# Patient Record
Sex: Female | Born: 1945
Health system: Southern US, Community
[De-identification: ages and names within clinical notes are randomized; demographics above are authoritative.]

## PROBLEM LIST (undated history)

## (undated) DIAGNOSIS — I509 Heart failure, unspecified: Secondary | ICD-10-CM

## (undated) DIAGNOSIS — E785 Hyperlipidemia, unspecified: Secondary | ICD-10-CM

## (undated) DIAGNOSIS — M5136 Other intervertebral disc degeneration, lumbar region: Secondary | ICD-10-CM

## (undated) DIAGNOSIS — R7989 Other specified abnormal findings of blood chemistry: Secondary | ICD-10-CM

## (undated) DIAGNOSIS — I10 Essential (primary) hypertension: Secondary | ICD-10-CM

## (undated) DIAGNOSIS — I429 Cardiomyopathy, unspecified: Secondary | ICD-10-CM

## (undated) DIAGNOSIS — R778 Other specified abnormalities of plasma proteins: Secondary | ICD-10-CM

## (undated) DIAGNOSIS — M51369 Other intervertebral disc degeneration, lumbar region without mention of lumbar back pain or lower extremity pain: Secondary | ICD-10-CM

## (undated) DIAGNOSIS — I480 Paroxysmal atrial fibrillation: Secondary | ICD-10-CM

## (undated) DIAGNOSIS — N201 Calculus of ureter: Secondary | ICD-10-CM

## (undated) DIAGNOSIS — J189 Pneumonia, unspecified organism: Secondary | ICD-10-CM

## (undated) DIAGNOSIS — K5790 Diverticulosis of intestine, part unspecified, without perforation or abscess without bleeding: Secondary | ICD-10-CM

## (undated) DIAGNOSIS — K219 Gastro-esophageal reflux disease without esophagitis: Secondary | ICD-10-CM

## (undated) DIAGNOSIS — E119 Type 2 diabetes mellitus without complications: Secondary | ICD-10-CM

## (undated) DIAGNOSIS — M5430 Sciatica, unspecified side: Secondary | ICD-10-CM

## (undated) DIAGNOSIS — N289 Disorder of kidney and ureter, unspecified: Secondary | ICD-10-CM

## (undated) DIAGNOSIS — I447 Left bundle-branch block, unspecified: Secondary | ICD-10-CM

## (undated) DIAGNOSIS — Z87442 Personal history of urinary calculi: Secondary | ICD-10-CM

## (undated) DIAGNOSIS — F419 Anxiety disorder, unspecified: Secondary | ICD-10-CM

## (undated) HISTORY — DX: Paroxysmal atrial fibrillation: I48.0

## (undated) HISTORY — PX: ABDOMINAL HYSTERECTOMY: SHX81

## (undated) HISTORY — DX: Hyperlipidemia, unspecified: E78.5

## (undated) HISTORY — DX: Other specified abnormal findings of blood chemistry: R79.89

## (undated) HISTORY — DX: Gastro-esophageal reflux disease without esophagitis: K21.9

## (undated) HISTORY — DX: Anxiety disorder, unspecified: F41.9

## (undated) HISTORY — PX: FRACTURE SURGERY: SHX138

## (undated) HISTORY — DX: Other specified abnormalities of plasma proteins: R77.8

## (undated) HISTORY — PX: OTHER SURGICAL HISTORY: SHX169

## (undated) HISTORY — PX: CHOLECYSTECTOMY: SHX55

## (undated) HISTORY — DX: Essential (primary) hypertension: I10

---

## 2011-07-05 ENCOUNTER — Emergency Department: Payer: Self-pay | Admitting: Emergency Medicine

## 2011-07-06 ENCOUNTER — Emergency Department: Payer: Self-pay | Admitting: *Deleted

## 2011-10-11 ENCOUNTER — Emergency Department: Payer: Self-pay | Admitting: Emergency Medicine

## 2011-10-11 LAB — CBC
HCT: 41.4 % (ref 35.0–47.0)
MCH: 30.2 pg (ref 26.0–34.0)
MCV: 85 fL (ref 80–100)
Platelet: 166 10*3/uL (ref 150–440)
RBC: 4.86 10*6/uL (ref 3.80–5.20)
WBC: 6.2 10*3/uL (ref 3.6–11.0)

## 2011-10-11 LAB — COMPREHENSIVE METABOLIC PANEL
Albumin: 4.3 g/dL (ref 3.4–5.0)
Alkaline Phosphatase: 161 U/L — ABNORMAL HIGH (ref 50–136)
Anion Gap: 9 (ref 7–16)
BUN: 9 mg/dL (ref 7–18)
Bilirubin,Total: 0.8 mg/dL (ref 0.2–1.0)
Calcium, Total: 10 mg/dL (ref 8.5–10.1)
Chloride: 106 mmol/L (ref 98–107)
Co2: 28 mmol/L (ref 21–32)
Creatinine: 1.04 mg/dL (ref 0.60–1.30)
EGFR (African American): >60
Glucose: 209 mg/dL — ABNORMAL HIGH (ref 65–99)
Osmolality: 290 (ref 275–301)
Potassium: 3.4 mmol/L — ABNORMAL LOW (ref 3.5–5.1)
SGOT(AST): 34 U/L (ref 15–37)
Sodium: 143 mmol/L (ref 136–145)
Total Protein: 8.6 g/dL — ABNORMAL HIGH (ref 6.4–8.2)

## 2011-10-11 LAB — URINALYSIS, COMPLETE
Bilirubin,UR: NEGATIVE
Glucose,UR: 50 mg/dL (ref 0–75)
Ketone: NEGATIVE
Leukocyte Esterase: NEGATIVE
Ph: 9 (ref 4.5–8.0)
Protein: NEGATIVE
RBC,UR: 1 /HPF (ref 0–5)
Squamous Epithelial: <1
WBC UR: 1 /HPF (ref 0–5)

## 2012-10-10 ENCOUNTER — Ambulatory Visit: Payer: Self-pay

## 2014-05-14 DIAGNOSIS — S39012A Strain of muscle, fascia and tendon of lower back, initial encounter: Secondary | ICD-10-CM | POA: Diagnosis not present

## 2014-05-14 DIAGNOSIS — E119 Type 2 diabetes mellitus without complications: Secondary | ICD-10-CM | POA: Diagnosis not present

## 2014-05-14 DIAGNOSIS — K219 Gastro-esophageal reflux disease without esophagitis: Secondary | ICD-10-CM | POA: Diagnosis not present

## 2014-05-14 DIAGNOSIS — E782 Mixed hyperlipidemia: Secondary | ICD-10-CM | POA: Diagnosis not present

## 2014-06-16 DIAGNOSIS — R300 Dysuria: Secondary | ICD-10-CM | POA: Diagnosis not present

## 2014-06-16 DIAGNOSIS — J209 Acute bronchitis, unspecified: Secondary | ICD-10-CM | POA: Diagnosis not present

## 2014-07-10 DIAGNOSIS — N760 Acute vaginitis: Secondary | ICD-10-CM | POA: Diagnosis not present

## 2014-07-10 DIAGNOSIS — N898 Other specified noninflammatory disorders of vagina: Secondary | ICD-10-CM | POA: Diagnosis not present

## 2014-07-28 DIAGNOSIS — B373 Candidiasis of vulva and vagina: Secondary | ICD-10-CM | POA: Diagnosis not present

## 2014-07-28 DIAGNOSIS — I10 Essential (primary) hypertension: Secondary | ICD-10-CM | POA: Diagnosis not present

## 2014-07-29 DIAGNOSIS — B373 Candidiasis of vulva and vagina: Secondary | ICD-10-CM | POA: Diagnosis not present

## 2014-08-08 DIAGNOSIS — E784 Other hyperlipidemia: Secondary | ICD-10-CM | POA: Diagnosis not present

## 2014-08-08 DIAGNOSIS — I10 Essential (primary) hypertension: Secondary | ICD-10-CM | POA: Diagnosis not present

## 2014-08-08 DIAGNOSIS — E119 Type 2 diabetes mellitus without complications: Secondary | ICD-10-CM | POA: Diagnosis not present

## 2014-08-08 DIAGNOSIS — R5381 Other malaise: Secondary | ICD-10-CM | POA: Diagnosis not present

## 2014-08-11 DIAGNOSIS — I10 Essential (primary) hypertension: Secondary | ICD-10-CM | POA: Diagnosis not present

## 2014-08-11 DIAGNOSIS — B373 Candidiasis of vulva and vagina: Secondary | ICD-10-CM | POA: Diagnosis not present

## 2014-11-11 DIAGNOSIS — Z23 Encounter for immunization: Secondary | ICD-10-CM | POA: Diagnosis not present

## 2014-11-11 DIAGNOSIS — M545 Low back pain: Secondary | ICD-10-CM | POA: Diagnosis not present

## 2014-11-11 DIAGNOSIS — E119 Type 2 diabetes mellitus without complications: Secondary | ICD-10-CM | POA: Diagnosis not present

## 2015-03-31 DIAGNOSIS — I10 Essential (primary) hypertension: Secondary | ICD-10-CM | POA: Diagnosis not present

## 2015-03-31 DIAGNOSIS — E119 Type 2 diabetes mellitus without complications: Secondary | ICD-10-CM | POA: Diagnosis not present

## 2015-06-01 DIAGNOSIS — J209 Acute bronchitis, unspecified: Secondary | ICD-10-CM | POA: Diagnosis not present

## 2015-06-01 DIAGNOSIS — I10 Essential (primary) hypertension: Secondary | ICD-10-CM | POA: Diagnosis not present

## 2015-06-01 DIAGNOSIS — E119 Type 2 diabetes mellitus without complications: Secondary | ICD-10-CM | POA: Diagnosis not present

## 2015-06-11 DIAGNOSIS — I10 Essential (primary) hypertension: Secondary | ICD-10-CM | POA: Diagnosis not present

## 2015-06-11 DIAGNOSIS — J209 Acute bronchitis, unspecified: Secondary | ICD-10-CM | POA: Diagnosis not present

## 2015-06-11 DIAGNOSIS — E119 Type 2 diabetes mellitus without complications: Secondary | ICD-10-CM | POA: Diagnosis not present

## 2015-06-12 DIAGNOSIS — E784 Other hyperlipidemia: Secondary | ICD-10-CM | POA: Diagnosis not present

## 2015-06-12 DIAGNOSIS — E119 Type 2 diabetes mellitus without complications: Secondary | ICD-10-CM | POA: Diagnosis not present

## 2015-06-12 DIAGNOSIS — R5381 Other malaise: Secondary | ICD-10-CM | POA: Diagnosis not present

## 2015-06-12 DIAGNOSIS — I10 Essential (primary) hypertension: Secondary | ICD-10-CM | POA: Diagnosis not present

## 2015-06-30 DIAGNOSIS — I10 Essential (primary) hypertension: Secondary | ICD-10-CM | POA: Diagnosis not present

## 2015-06-30 DIAGNOSIS — E119 Type 2 diabetes mellitus without complications: Secondary | ICD-10-CM | POA: Diagnosis not present

## 2015-07-30 DIAGNOSIS — I10 Essential (primary) hypertension: Secondary | ICD-10-CM | POA: Diagnosis not present

## 2015-07-30 DIAGNOSIS — E119 Type 2 diabetes mellitus without complications: Secondary | ICD-10-CM | POA: Diagnosis not present

## 2015-10-30 DIAGNOSIS — I10 Essential (primary) hypertension: Secondary | ICD-10-CM | POA: Diagnosis not present

## 2015-10-30 DIAGNOSIS — E119 Type 2 diabetes mellitus without complications: Secondary | ICD-10-CM | POA: Diagnosis not present

## 2015-12-17 DIAGNOSIS — Z23 Encounter for immunization: Secondary | ICD-10-CM | POA: Diagnosis not present

## 2016-04-05 DIAGNOSIS — E119 Type 2 diabetes mellitus without complications: Secondary | ICD-10-CM | POA: Diagnosis not present

## 2016-04-05 DIAGNOSIS — I10 Essential (primary) hypertension: Secondary | ICD-10-CM | POA: Diagnosis not present

## 2016-04-05 DIAGNOSIS — M545 Low back pain: Secondary | ICD-10-CM | POA: Diagnosis not present

## 2016-05-04 DIAGNOSIS — I10 Essential (primary) hypertension: Secondary | ICD-10-CM | POA: Diagnosis not present

## 2016-05-04 DIAGNOSIS — E119 Type 2 diabetes mellitus without complications: Secondary | ICD-10-CM | POA: Diagnosis not present

## 2016-05-04 DIAGNOSIS — R5381 Other malaise: Secondary | ICD-10-CM | POA: Diagnosis not present

## 2016-05-04 DIAGNOSIS — E784 Other hyperlipidemia: Secondary | ICD-10-CM | POA: Diagnosis not present

## 2016-05-19 DIAGNOSIS — F419 Anxiety disorder, unspecified: Secondary | ICD-10-CM | POA: Diagnosis not present

## 2016-05-19 DIAGNOSIS — F329 Major depressive disorder, single episode, unspecified: Secondary | ICD-10-CM | POA: Diagnosis not present

## 2016-05-19 DIAGNOSIS — E119 Type 2 diabetes mellitus without complications: Secondary | ICD-10-CM | POA: Diagnosis not present

## 2016-05-19 DIAGNOSIS — M549 Dorsalgia, unspecified: Secondary | ICD-10-CM | POA: Diagnosis not present

## 2016-09-30 DIAGNOSIS — E119 Type 2 diabetes mellitus without complications: Secondary | ICD-10-CM | POA: Diagnosis not present

## 2016-09-30 DIAGNOSIS — I10 Essential (primary) hypertension: Secondary | ICD-10-CM | POA: Diagnosis not present

## 2016-09-30 DIAGNOSIS — M549 Dorsalgia, unspecified: Secondary | ICD-10-CM | POA: Diagnosis not present

## 2016-09-30 DIAGNOSIS — F419 Anxiety disorder, unspecified: Secondary | ICD-10-CM | POA: Diagnosis not present

## 2017-02-27 DIAGNOSIS — H524 Presbyopia: Secondary | ICD-10-CM | POA: Diagnosis not present

## 2017-04-19 ENCOUNTER — Other Ambulatory Visit: Payer: Self-pay | Admitting: Internal Medicine

## 2017-04-19 DIAGNOSIS — R319 Hematuria, unspecified: Secondary | ICD-10-CM

## 2017-04-24 ENCOUNTER — Ambulatory Visit
Admission: RE | Admit: 2017-04-24 | Discharge: 2017-04-24 | Disposition: A | Discharge status: Home / Self Care | Payer: Medicare HMO | Source: Ambulatory Visit | Attending: Internal Medicine | Admitting: Internal Medicine

## 2017-04-24 DIAGNOSIS — N1330 Unspecified hydronephrosis: Secondary | ICD-10-CM | POA: Diagnosis not present

## 2017-04-24 DIAGNOSIS — R319 Hematuria, unspecified: Secondary | ICD-10-CM | POA: Diagnosis not present

## 2017-04-24 DIAGNOSIS — N1339 Other hydronephrosis: Secondary | ICD-10-CM | POA: Diagnosis not present

## 2017-04-24 DIAGNOSIS — I10 Essential (primary) hypertension: Secondary | ICD-10-CM | POA: Insufficient documentation

## 2017-04-24 DIAGNOSIS — N200 Calculus of kidney: Secondary | ICD-10-CM | POA: Insufficient documentation

## 2017-05-08 ENCOUNTER — Telehealth: Payer: Self-pay | Admitting: Urology

## 2017-05-08 NOTE — Telephone Encounter (Signed)
Ideally yes.  I probably will not end up not operating all Friday morning, can probably work her in Friday morning, add her to a wait list and I will confirm later this week.  Vanna Scotland, MD

## 2017-05-08 NOTE — Telephone Encounter (Signed)
Will you take a look at this patient's [place] and let me know if she needs to be seen this week? And if so where?   Thanks, Marcelino Duster

## 2017-05-12 ENCOUNTER — Ambulatory Visit
Admission: RE | Admit: 2017-05-12 | Discharge: 2017-05-12 | Disposition: A | Discharge status: Home / Self Care | Payer: Medicare HMO | Source: Ambulatory Visit | Attending: Urology | Admitting: Urology

## 2017-05-12 ENCOUNTER — Encounter: Payer: Self-pay | Admitting: Urology

## 2017-05-12 ENCOUNTER — Ambulatory Visit (INDEPENDENT_AMBULATORY_CARE_PROVIDER_SITE_OTHER): Payer: Medicare HMO | Admitting: Urology

## 2017-05-12 VITALS — BP 148/83 | HR 91 | Resp 16 | Ht 61.0 in | Wt 198.8 lb

## 2017-05-12 DIAGNOSIS — N132 Hydronephrosis with renal and ureteral calculous obstruction: Secondary | ICD-10-CM | POA: Insufficient documentation

## 2017-05-12 DIAGNOSIS — R310 Gross hematuria: Secondary | ICD-10-CM

## 2017-05-12 DIAGNOSIS — N201 Calculus of ureter: Secondary | ICD-10-CM

## 2017-05-12 DIAGNOSIS — N200 Calculus of kidney: Secondary | ICD-10-CM

## 2017-05-12 DIAGNOSIS — N1330 Unspecified hydronephrosis: Secondary | ICD-10-CM | POA: Diagnosis not present

## 2017-05-12 LAB — URINALYSIS, COMPLETE
BILIRUBIN UA: NEGATIVE
Ketones, UA: NEGATIVE
Nitrite, UA: NEGATIVE
Specific Gravity, UA: 1.025 (ref 1.005–1.030)
UUROB: 0.2 mg/dL (ref 0.2–1.0)
pH, UA: 5 (ref 5.0–7.5)

## 2017-05-12 LAB — MICROSCOPIC EXAMINATION: RBC, UA: >30 /hpf — ABNORMAL HIGH (ref 0–2)

## 2017-05-12 MED ORDER — TAMSULOSIN HCL 0.4 MG PO CAPS: 0.4000 mg | ORAL_CAPSULE | Freq: Every day | ORAL | 0 refills | Status: DC

## 2017-05-12 NOTE — Telephone Encounter (Signed)
Pt called and wants to know results of x-ray.  She was told she would get a phone call today.

## 2017-05-12 NOTE — Progress Notes (Signed)
05/12/2017 5:05 PM   Kristina Archer [birth year] [id number]  Referring provider: Corky Downs, MD [address] [place], [place] [address]  Chief Complaint  Patient presents with  . New Patient (Initial Visit)    left hydronephrosis    HPI: 72 year old female referred for further evaluation of left-sided hydronephrosis as well as a nonobstructing right renal stone.  Kristina Archer reports having episodes of painless gross hematuria about a month ago which lasted for about 3 days.  She had no significant urgency, frequency, pelvic or flank pain associated with gross hematuria.  She was evaluated by her primary care physician for presumed urinary tract infection and treated with sulfa antibiotics UA was fairly unremarkable other than for blood.     As part of her work-up, she underwent a renal ultrasound for further evaluation of hematuria demonstrating a nonobstructing 13 mm right-sided kidney stone as well as mild left hydronephrosis of unclear etiology.  She denies history of flank pain or kidney stones.  She does have chronic low back pain that lateralizes occasionally both the left and right which is unchanged in severity and has been present for many years.  She has had no worsening of this chronic back pain.  She is a former smoker, quit 20 years ago with a 22-pack-year history.  Today, she has no urinary symptoms including no frequency, urgency, dysuria, or ongoing gross hematuria.  UA today below.    PMH: Past Medical History:  Diagnosis Date  . Anxiety   . Diabetes (HCC)   . GERD (gastroesophageal reflux disease)   . High blood pressure   . High cholesterol     Surgical History: Past Surgical History:  Procedure Laterality Date  . CHOLECYSTECTOMY      Home Medications:  Allergies as of 05/12/2017   No Known Allergies     Medication List        Accurate as of 05/12/17 11:59 PM. Always use your most recent med list.          amLODipine-benazepril 5-10 MG  capsule Commonly known as:  LOTREL TAKE ONE (1) CAPSULE EACH DAY   CRESTOR 5 MG tablet Generic drug:  rosuvastatin Take by mouth.   furosemide 20 MG tablet Commonly known as:  LASIX TAKE ONE (1) TABLET EACH DAY AS NEEDED FOR EDEMA **PLEASE CALL TO SCHEDULE APPT   meloxicam 15 MG tablet Commonly known as:  MOBIC TAKE ONE (1) TABLET EACH DAY   metFORMIN 500 MG 24 hr tablet Commonly known as:  GLUCOPHAGE-XR TAKE ONE (1) TABLET EACH DAY WITH DINNER   PARoxetine 20 MG tablet Commonly known as:  PAXIL TAKE ONE (1) TABLET EACH DAY   tamsulosin 0.4 MG Caps capsule Commonly known as:  FLOMAX Take 1 capsule (0.4 mg total) by mouth daily.   TRUETRACK TEST test strip Generic drug:  glucose blood Use 2 (two) times daily. As instructed. DX 250.00   Vitamin D3 1000 units Caps Take by mouth.       Allergies: No Known Allergies  Family History: Family History  Problem Relation Age of Onset  . Bladder Cancer Neg Hx   . Kidney cancer Neg Hx     Social History:  reports that she quit smoking about 20 years ago. Her smoking use included cigarettes. She has a 22.50 pack-year smoking history. She has never used smokeless tobacco. She reports that she drank alcohol. Her drug history is not on file.  ROS: UROLOGY Frequent Urination?: Yes Hard to postpone urination?: No  Burning/pain with urination?: No Get up at night to urinate?: No Leakage of urine?: Yes Urine stream starts and stops?: No Trouble starting stream?: No Do you have to strain to urinate?: No Blood in urine?: Yes Urinary tract infection?: No Sexually transmitted disease?: No Injury to kidneys or bladder?: No Painful intercourse?: No Weak stream?: No Currently pregnant?: No Vaginal bleeding?: No  Gastrointestinal Nausea?: No Vomiting?: No Indigestion/heartburn?: Yes Diarrhea?: No Constipation?: No  Constitutional Fever: No Night sweats?: No Weight loss?: No Fatigue?: No  Skin Skin rash/lesions?:  No Itching?: No  Eyes Blurred vision?: No Double vision?: No  Ears/Nose/Throat Sore throat?: No Sinus problems?: No  Hematologic/Lymphatic Swollen glands?: No Easy bruising?: No  Cardiovascular Leg swelling?: No Chest pain?: No  Respiratory Cough?: No Shortness of breath?: No  Endocrine Excessive thirst?: No  Musculoskeletal Back pain?: Yes Joint pain?: No  Neurological Headaches?: No Dizziness?: No  Psychologic Depression?: No Anxiety?: Yes  Physical Exam: BP (!) 148/83   Pulse 91   Resp 16   Ht  (1.549 m)   Wt 198 lb 12.8 oz (90.2 kg)   SpO2 98%   BMI 37.56 kg/m   Constitutional:  Alert and oriented, No acute distress. HEENT: [place] AT, moist mucus membranes.  Trachea midline, no masses. Cardiovascular: No clubbing, cyanosis, or edema. Respiratory: Normal respiratory effort, no increased work of breathing. GI: Abdomen is soft, nontender, nondistended, no abdominal masses.  Obese. GU: No CVA tenderness Skin: No rashes, bruises or suspicious lesions. Neurologic: Grossly intact, no focal deficits, moving all 4 extremities. Psychiatric: Normal mood and affect.  Laboratory Data: Lab Results  Component Value Date   WBC 6.2 10/11/2011   HGB 14.7 10/11/2011   HCT 41.4 10/11/2011   MCV 85 10/11/2011   PLT 166 10/11/2011    Lab Results  Component Value Date   CREATININE 1.04 10/11/2011    Urinalysis UA today shows 1+ glucose, 3+ blood, 2+ protein, 1+ leukocytes, nitrite negative.  Microscopic evaluation demonstrates 11-30 white blood cells per high-power field, greater than 30 red blood cells per high-powered field, and presence of calcium oxalate crystals.  There is few bacteria.  Pertinent Imaging: Results for orders placed during the hospital encounter of 04/24/17  US RENAL   Narrative CLINICAL DATA:  Hematuria.  EXAM: RENAL / URINARY TRACT ULTRASOUND COMPLETE  COMPARISON:  None.  FINDINGS: Right Kidney:  Length: 10.6 cm.  Echogenicity within normal limits. 13 mm stone in the lower pole right kidney. No mass or hydronephrosis visualized.  Left Kidney:  Length: 10.4 cm. Echogenicity within normal limits. Mild to moderate left hydronephrosis.  Bladder:  Appears normal for degree of bladder distention. Bilateral ureteral jets are identified. Bladder appears normal. Prevoid volume was 141 cc. Postvoid volume is 0.  IMPRESSION: 1. Mild to moderate left hydronephrosis without visible etiology. There is a left ureteral jet demonstrated in the bladder. 2. 13 mm stone in the otherwise normal appearing right kidney.   Electronically Signed   By: Francene Boyers M.D.   On: 04/24/2017 14:10    Renal ultrasound was personally reviewed today.  KUB ordered today and personally reviewed.  CLINICAL DATA:  13 mm lower pole right renal calculus on a recent renal ultrasound.  EXAM: ABDOMEN - 1 VIEW  COMPARISON:  Renal ultrasound dated 04/24/2017.  FINDINGS: Normal bowel gas pattern. Reason motion blurring on the view including the mid and upper abdomen. There are faint rounded calcifications in the region of the proximal left ureter on that view,  1 measuring 6 mm in maximum diameter and the other measuring 7 mm in maximum diameter. These are better demonstrated on the view including the pelvis and lower abdomen. There is also faint calcification overlying the lower pole of the right kidney, obscured by overlying stool. Bilateral pelvic phleboliths are noted. Cholecystectomy clips. Single surgical clip overlying the left iliac bone. Two surgical clips overlying the right mid lower abdomen. Moderate left lateral spur formation and disc space narrowing at the L2-3 level. The bones appear osteopenic.  IMPRESSION: 1. 7 mm and 6 mm proximal left ureteral calculi, explaining the recently demonstrated left hydronephrosis. 2. Poorly visualized probable calculi in the lower pole of the  right kidney.   Electronically Signed   By: Beckie Salts M.D.   On: 05/12/2017 16:48  Assessment & Plan:    1. Left ureteral stone Left hydronephrosis secondary to 2 obstructing ureteral calculi, 7 and 6 mm respectively KUB ordered and reviewed personally today Given that her hydronephrosis has been present now for several weeks at minimum and multiple, I recommended surgical intervention We discussed the alternatives including shockwave lithotripsy and ureteroscopy Given that there are multiple stones, ureteroscopy seems like the most reasonable choice .Risks and benefits of ureteroscopy were reviewed including but not limited to infection, bleeding, pain, ureteral injury which could require open surgery versus prolonged indwelling if ureteralperforation occurs, persistent stone disease, requirement for staged procedure, possible stent, and global anesthesia risks. Patient expressed understanding and desires to proceed with ureteroscopy. - Urinalysis, Complete - CULTURE, URINE COMPREHENSIVE - DG Abd 1 View; Future  2. Hydronephrosis, left Secondary to #1  3. Right nephrolithiasis Large nonobstructing stone on renal ultrasound but not appreciated on KUB due to distortion from bowel gas Will address once ureteral stones have been treated  4. Gross hematuria Secondary to #1 We will proceed with bilateral retrograde pyelogram to rule out any additional filling defects other than related to stones and cystoscopy   Vanna Scotland, MD  [facility] [address], [address] [place], [place] [address] [phone]

## 2017-05-12 NOTE — H&P (View-Only) (Signed)
 05/12/2017 5:05 PM   Kristina Archer 10/17/1945 [id number]  Referring provider: Masoud, [name], MD [address] [place], [place] [address]  Chief Complaint  Patient presents with  . New Patient (Initial Visit)    left hydronephrosis    HPI: 72-year-old female referred for further evaluation of left-sided hydronephrosis as well as a nonobstructing right renal stone.  Kristina Archer reports having episodes of painless gross hematuria about a month ago which lasted for about 3 days.  She had no significant urgency, frequency, pelvic or flank pain associated with gross hematuria.  She was evaluated by her primary care physician for presumed urinary tract infection and treated with sulfa antibiotics UA was fairly unremarkable other than for blood.     As part of her work-up, she underwent a renal ultrasound for further evaluation of hematuria demonstrating a nonobstructing 13 mm right-sided kidney stone as well as mild left hydronephrosis of unclear etiology.  She denies history of flank pain or kidney stones.  She does have chronic low back pain that lateralizes occasionally both the left and right which is unchanged in severity and has been present for many years.  She has had no worsening of this chronic back pain.  She is a former smoker, quit 20 years ago with a 22-pack-year history.  Today, she has no urinary symptoms including no frequency, urgency, dysuria, or ongoing gross hematuria.  UA today below.    PMH: Past Medical History:  Diagnosis Date  . Anxiety   . Diabetes (HCC)   . GERD (gastroesophageal reflux disease)   . High blood pressure   . High cholesterol     Surgical History: Past Surgical History:  Procedure Laterality Date  . CHOLECYSTECTOMY      Home Medications:  Allergies as of 05/12/2017   No Known Allergies     Medication List        Accurate as of 05/12/17 11:59 PM. Always use your most recent med list.          amLODipine-benazepril 5-10 MG  capsule Commonly known as:  LOTREL TAKE ONE (1) CAPSULE EACH DAY   CRESTOR 5 MG tablet Generic drug:  rosuvastatin Take by mouth.   furosemide 20 MG tablet Commonly known as:  LASIX TAKE ONE (1) TABLET EACH DAY AS NEEDED FOR EDEMA **PLEASE CALL TO SCHEDULE APPT   meloxicam 15 MG tablet Commonly known as:  MOBIC TAKE ONE (1) TABLET EACH DAY   metFORMIN 500 MG 24 hr tablet Commonly known as:  GLUCOPHAGE-XR TAKE ONE (1) TABLET EACH DAY WITH DINNER   PARoxetine 20 MG tablet Commonly known as:  PAXIL TAKE ONE (1) TABLET EACH DAY   tamsulosin 0.4 MG Caps capsule Commonly known as:  FLOMAX Take 1 capsule (0.4 mg total) by mouth daily.   TRUETRACK TEST test strip Generic drug:  glucose blood Use 2 (two) times daily. As instructed. DX 250.00   Vitamin D3 1000 units Caps Take by mouth.       Allergies: No Known Allergies  Family History: Family History  Problem Relation Age of Onset  . Bladder Cancer Neg Hx   . Kidney cancer Neg Hx     Social History:  reports that she quit smoking about 20 years ago. Her smoking use included cigarettes. She has a 22.50 pack-year smoking history. She has never used smokeless tobacco. She reports that she drank alcohol. Her drug history is not on file.  ROS: UROLOGY Frequent Urination?: Yes Hard to postpone urination?: No   Burning/pain with urination?: No Get up at night to urinate?: No Leakage of urine?: Yes Urine stream starts and stops?: No Trouble starting stream?: No Do you have to strain to urinate?: No Blood in urine?: Yes Urinary tract infection?: No Sexually transmitted disease?: No Injury to kidneys or bladder?: No Painful intercourse?: No Weak stream?: No Currently pregnant?: No Vaginal bleeding?: No  Gastrointestinal Nausea?: No Vomiting?: No Indigestion/heartburn?: Yes Diarrhea?: No Constipation?: No  Constitutional Fever: No Night sweats?: No Weight loss?: No Fatigue?: No  Skin Skin rash/lesions?:  No Itching?: No  Eyes Blurred vision?: No Double vision?: No  Ears/Nose/Throat Sore throat?: No Sinus problems?: No  Hematologic/Lymphatic Swollen glands?: No Easy bruising?: No  Cardiovascular Leg swelling?: No Chest pain?: No  Respiratory Cough?: No Shortness of breath?: No  Endocrine Excessive thirst?: No  Musculoskeletal Back pain?: Yes Joint pain?: No  Neurological Headaches?: No Dizziness?: No  Psychologic Depression?: No Anxiety?: Yes  Physical Exam: BP (!) 148/83   Pulse 91   Resp 16   Ht 5' 1" (1.549 m)   Wt 198 lb 12.8 oz (90.2 kg)   SpO2 98%   BMI 37.56 kg/m   Constitutional:  Alert and oriented, No acute distress. HEENT: [place] AT, moist mucus membranes.  Trachea midline, no masses. Cardiovascular: No clubbing, cyanosis, or edema. Respiratory: Normal respiratory effort, no increased work of breathing. GI: Abdomen is soft, nontender, nondistended, no abdominal masses.  Obese. GU: No CVA tenderness Skin: No rashes, bruises or suspicious lesions. Neurologic: Grossly intact, no focal deficits, moving all 4 extremities. Psychiatric: Normal mood and affect.  Laboratory Data: Lab Results  Component Value Date   WBC 6.2 10/11/2011   HGB 14.7 10/11/2011   HCT 41.4 10/11/2011   MCV 85 10/11/2011   PLT 166 10/11/2011    Lab Results  Component Value Date   CREATININE 1.04 10/11/2011    Urinalysis UA today shows 1+ glucose, 3+ blood, 2+ protein, 1+ leukocytes, nitrite negative.  Microscopic evaluation demonstrates 11-30 white blood cells per high-power field, greater than 30 red blood cells per high-powered field, and presence of calcium oxalate crystals.  There is few bacteria.  Pertinent Imaging: Results for orders placed during the hospital encounter of 04/24/17  US RENAL   Narrative CLINICAL DATA:  Hematuria.  EXAM: RENAL / URINARY TRACT ULTRASOUND COMPLETE  COMPARISON:  None.  FINDINGS: Right Kidney:  Length: 10.6 cm.  Echogenicity within normal limits. 13 mm stone in the lower pole right kidney. No mass or hydronephrosis visualized.  Left Kidney:  Length: 10.4 cm. Echogenicity within normal limits. Mild to moderate left hydronephrosis.  Bladder:  Appears normal for degree of bladder distention. Bilateral ureteral jets are identified. Bladder appears normal. Prevoid volume was 141 cc. Postvoid volume is 0.  IMPRESSION: 1. Mild to moderate left hydronephrosis without visible etiology. There is a left ureteral jet demonstrated in the bladder. 2. 13 mm stone in the otherwise normal appearing right kidney.   Electronically Signed   By: James  Maxwell M.D.   On: 04/24/2017 14:10    Renal ultrasound was personally reviewed today.  KUB ordered today and personally reviewed.  CLINICAL DATA:  13 mm lower pole right renal calculus on a recent renal ultrasound.  EXAM: ABDOMEN - 1 VIEW  COMPARISON:  Renal ultrasound dated 04/24/2017.  FINDINGS: Normal bowel gas pattern. Reason motion blurring on the view including the mid and upper abdomen. There are faint rounded calcifications in the region of the proximal left ureter on that view,   1 measuring 6 mm in maximum diameter and the other measuring 7 mm in maximum diameter. These are better demonstrated on the view including the pelvis and lower abdomen. There is also faint calcification overlying the lower pole of the right kidney, obscured by overlying stool. Bilateral pelvic phleboliths are noted. Cholecystectomy clips. Single surgical clip overlying the left iliac bone. Two surgical clips overlying the right mid lower abdomen. Moderate left lateral spur formation and disc space narrowing at the L2-3 level. The bones appear osteopenic.  IMPRESSION: 1. 7 mm and 6 mm proximal left ureteral calculi, explaining the recently demonstrated left hydronephrosis. 2. Poorly visualized probable calculi in the lower pole of the  right kidney.   Electronically Signed   By: Steven  Reid M.D.   On: 05/12/2017 16:48  Assessment & Plan:    1. Left ureteral stone Left hydronephrosis secondary to 2 obstructing ureteral calculi, 7 and 6 mm respectively KUB ordered and reviewed personally today Given that her hydronephrosis has been present now for several weeks at minimum and multiple, I recommended surgical intervention We discussed the alternatives including shockwave lithotripsy and ureteroscopy Given that there are multiple stones, ureteroscopy seems like the most reasonable choice .Risks and benefits of ureteroscopy were reviewed including but not limited to infection, bleeding, pain, ureteral injury which could require open surgery versus prolonged indwelling if ureteralperforation occurs, persistent stone disease, requirement for staged procedure, possible stent, and global anesthesia risks. Patient expressed understanding and desires to proceed with ureteroscopy. - Urinalysis, Complete - CULTURE, URINE COMPREHENSIVE - DG Abd 1 View; Future  2. Hydronephrosis, left Secondary to #1  3. Right nephrolithiasis Large nonobstructing stone on renal ultrasound but not appreciated on KUB due to distortion from bowel gas Will address once ureteral stones have been treated  4. Gross hematuria Secondary to #1 We will proceed with bilateral retrograde pyelogram to rule out any additional filling defects other than related to stones and cystoscopy   Twinkle Sockwell, MD  [facility] [address], [address] [place], [place] [address] [phone]  

## 2017-05-15 ENCOUNTER — Other Ambulatory Visit: Payer: Self-pay | Admitting: Radiology

## 2017-05-15 ENCOUNTER — Encounter: Payer: Self-pay | Admitting: Urology

## 2017-05-15 DIAGNOSIS — N1330 Unspecified hydronephrosis: Secondary | ICD-10-CM

## 2017-05-15 DIAGNOSIS — N201 Calculus of ureter: Secondary | ICD-10-CM

## 2017-05-15 NOTE — Telephone Encounter (Signed)
Results were discussed with patient by telephone today.  I explained that her results did not result until after office hours on Friday thus no arrangements could be made.  We have arranged for left ureteroscopy, laser lithotripsy.  All questions were answered.  Vanna Scotland, MD

## 2017-05-15 NOTE — Telephone Encounter (Signed)
Pt called office again inquiring about her xray results, pt states someone was supposed to call her with results on Friday, Pt is very concerned that no one has called her like promised about her results or getting medication to help her pass a stone. Pt is very upset why she was referred to a surgeon and now getting no communication about what is going on. Please advise pt at [phone].

## 2017-05-16 ENCOUNTER — Other Ambulatory Visit: Payer: Self-pay | Admitting: Radiology

## 2017-05-16 ENCOUNTER — Telehealth: Payer: Self-pay | Admitting: Radiology

## 2017-05-16 NOTE — Telephone Encounter (Signed)
Done

## 2017-05-16 NOTE — Telephone Encounter (Signed)
-----   Message from Vanna Scotland, MD sent at 05/15/2017  5:24 PM EDT ----- Can you add bilateral RTG to procedure?

## 2017-05-17 LAB — CULTURE, URINE COMPREHENSIVE

## 2017-05-19 ENCOUNTER — Encounter: Payer: Self-pay | Admitting: *Deleted

## 2017-05-19 ENCOUNTER — Other Ambulatory Visit: Payer: Self-pay

## 2017-05-19 ENCOUNTER — Encounter
Admission: RE | Admit: 2017-05-19 | Discharge: 2017-05-19 | Disposition: A | Discharge status: Home / Self Care | Payer: Medicare HMO | Source: Ambulatory Visit | Attending: Urology | Admitting: Urology

## 2017-05-19 DIAGNOSIS — I447 Left bundle-branch block, unspecified: Secondary | ICD-10-CM | POA: Insufficient documentation

## 2017-05-19 DIAGNOSIS — Z01812 Encounter for preprocedural laboratory examination: Secondary | ICD-10-CM | POA: Insufficient documentation

## 2017-05-19 DIAGNOSIS — R9431 Abnormal electrocardiogram [ECG] [EKG]: Secondary | ICD-10-CM | POA: Diagnosis not present

## 2017-05-19 DIAGNOSIS — Z01818 Encounter for other preprocedural examination: Secondary | ICD-10-CM | POA: Insufficient documentation

## 2017-05-19 DIAGNOSIS — I10 Essential (primary) hypertension: Secondary | ICD-10-CM | POA: Diagnosis not present

## 2017-05-19 LAB — CBC
HEMATOCRIT: 38.9 % (ref 35.0–47.0)
HEMOGLOBIN: 13.6 g/dL (ref 12.0–16.0)
MCH: 29.4 pg (ref 26.0–34.0)
MCHC: 34.9 g/dL (ref 32.0–36.0)
MCV: 84.2 fL (ref 80.0–100.0)
Platelets: 158 10*3/uL (ref 150–440)
RBC: 4.62 MIL/uL (ref 3.80–5.20)
RDW: 14.2 % (ref 11.5–14.5)
WBC: 5.4 10*3/uL (ref 3.6–11.0)

## 2017-05-19 LAB — BASIC METABOLIC PANEL
Anion gap: 8 (ref 5–15)
BUN: 12 mg/dL (ref 6–20)
CHLORIDE: 103 mmol/L (ref 101–111)
CO2: 24 mmol/L (ref 22–32)
Calcium: 9.3 mg/dL (ref 8.9–10.3)
Creatinine, Ser: 0.92 mg/dL (ref 0.44–1.00)
GFR calc Af Amer: >60 mL/min (ref >60)
GLUCOSE: 279 mg/dL — AB (ref 65–99)
POTASSIUM: 3.5 mmol/L (ref 3.5–5.1)
Sodium: 135 mmol/L (ref 135–145)

## 2017-05-19 LAB — DIFFERENTIAL
BASOS PCT: 1 %
Basophils Absolute: 0.1 10*3/uL (ref 0–0.1)
Eosinophils Absolute: 0.1 10*3/uL (ref 0–0.7)
Eosinophils Relative: 3 %
LYMPHS ABS: 1.7 10*3/uL (ref 1.0–3.6)
Lymphocytes Relative: 32 %
MONO ABS: 0.4 10*3/uL (ref 0.2–0.9)
MONOS PCT: 8 %
NEUTROS ABS: 3 10*3/uL (ref 1.4–6.5)
Neutrophils Relative %: 56 %

## 2017-05-19 NOTE — Patient Instructions (Signed)
Your procedure is scheduled on: Wed. 05/24/17 Report to Day Surgery. To find out your arrival time please call [phone] between 1PM - 3PM on Tues. 5/14.  Remember: Instructions that are not followed completely may result in serious medical risk, up to and including death, or upon the discretion of your surgeon and anesthesiologist your surgery may need to be rescheduled.     _X__ 1. Do not eat food after midnight the night before your procedure.                 No gum chewing or hard candies. You may drink clear liquids up to 2 hours                 before you are scheduled to arrive for your surgery- DO not drink clear                 liquids within 2 hours of the start of your surgery.                 Clear Liquids include:  water,  __X__2.  On the morning of surgery brush your teeth with toothpaste and water, you may rinse your mouth with mouthwash if you wish.  Do not swallow any              toothpaste of mouthwash.     _X__ 3.  No Alcohol for 24 hours before or after surgery.   __ 4.  Do Not Smoke or use e-cigarettes For 24 Hours Prior to Your Surgery.                 Do not use any chewable tobacco products for at least 6 hours prior to                 surgery.  ____  5.  Bring all medications with you on the day of surgery if instructed.   _x___  6.  Notify your doctor if there is any change in your medical condition      (cold, fever, infections).     Do not wear jewelry, make-up, hairpins, clips or nail polish. Do not wear lotions, powders, or perfumes. You may wear deodorant. Do not shave 48 hours prior to surgery. Men may shave face and neck. Do not bring valuables to the hospital.    [facility] is not responsible for any belongings or valuables.  Contacts, dentures or bridgework may not be worn into surgery. Leave your suitcase in the car. After surgery it may be brought to your room. For patients admitted to the hospital, discharge time is  determined by your treatment team.   Patients discharged the day of surgery will not be allowed to drive home.   Please read over the following fact sheets that you were given:    _x___ Take these medicines the morning of surgery with A SIP OF WATER:    1. dexlansoprazole (DEXILANT) 60 MG capsule  2. rosuvastatin (CRESTOR) 5 MG tablet  3.   4.  5.  6.  ____ Fleet Enema (as directed)   ____ Use CHG Soap as directed  ____ Use inhalers on the day of surgery  _x___ Stop metformin 2 days prior to surgery Sunday evening dose will be your last dose    ____ Take 1/2 of usual insulin dose the night before surgery. No insulin the morning          of surgery.   ____ Stop Coumadin/Plavix/aspirin  on   __x__ Stop Anti-inflammatories on  May take Tylenol  But no Ibuprofen or Aleve   __x__ Stop supplements until after surgery. Fish oil    ____ Bring C-Pap to the hospital.

## 2017-05-23 MED ORDER — CEFAZOLIN SODIUM-DEXTROSE 2-4 GM/100ML-% IV SOLN
2.0000 g | INTRAVENOUS | Status: AC
  Administered 2017-05-24: 2 g via INTRAVENOUS

## 2017-05-24 ENCOUNTER — Other Ambulatory Visit: Payer: Self-pay

## 2017-05-24 ENCOUNTER — Encounter
Admission: RE | Disposition: A | Discharge status: Home / Self Care | Payer: Self-pay | Source: Ambulatory Visit | Attending: Urology

## 2017-05-24 ENCOUNTER — Encounter: Payer: Self-pay | Admitting: *Deleted

## 2017-05-24 ENCOUNTER — Ambulatory Visit
Admission: RE | Admit: 2017-05-24 | Discharge: 2017-05-24 | Disposition: A | Discharge status: Home / Self Care | Payer: Medicare HMO | Source: Ambulatory Visit | Attending: Urology | Admitting: Urology

## 2017-05-24 ENCOUNTER — Ambulatory Visit: Payer: Medicare HMO | Admitting: Anesthesiology

## 2017-05-24 DIAGNOSIS — E119 Type 2 diabetes mellitus without complications: Secondary | ICD-10-CM | POA: Diagnosis not present

## 2017-05-24 DIAGNOSIS — K219 Gastro-esophageal reflux disease without esophagitis: Secondary | ICD-10-CM | POA: Diagnosis not present

## 2017-05-24 DIAGNOSIS — Z87891 Personal history of nicotine dependence: Secondary | ICD-10-CM | POA: Insufficient documentation

## 2017-05-24 DIAGNOSIS — Z7984 Long term (current) use of oral hypoglycemic drugs: Secondary | ICD-10-CM | POA: Diagnosis not present

## 2017-05-24 DIAGNOSIS — N132 Hydronephrosis with renal and ureteral calculous obstruction: Secondary | ICD-10-CM | POA: Insufficient documentation

## 2017-05-24 DIAGNOSIS — F419 Anxiety disorder, unspecified: Secondary | ICD-10-CM | POA: Insufficient documentation

## 2017-05-24 DIAGNOSIS — N201 Calculus of ureter: Secondary | ICD-10-CM

## 2017-05-24 DIAGNOSIS — Z79899 Other long term (current) drug therapy: Secondary | ICD-10-CM | POA: Insufficient documentation

## 2017-05-24 DIAGNOSIS — E7800 Pure hypercholesterolemia, unspecified: Secondary | ICD-10-CM | POA: Insufficient documentation

## 2017-05-24 DIAGNOSIS — I10 Essential (primary) hypertension: Secondary | ICD-10-CM | POA: Diagnosis not present

## 2017-05-24 DIAGNOSIS — N1330 Unspecified hydronephrosis: Secondary | ICD-10-CM

## 2017-05-24 HISTORY — PX: CYSTOSCOPY W/ RETROGRADES: SHX1426

## 2017-05-24 HISTORY — PX: CYSTOSCOPY/URETEROSCOPY/HOLMIUM LASER/STENT PLACEMENT: SHX6546

## 2017-05-24 LAB — GLUCOSE, CAPILLARY
GLUCOSE-CAPILLARY: 190 mg/dL — AB (ref 65–99)
GLUCOSE-CAPILLARY: 210 mg/dL — AB (ref 65–99)

## 2017-05-24 SURGERY — CYSTOSCOPY/URETEROSCOPY/HOLMIUM LASER/STENT PLACEMENT
Anesthesia: General | Site: Ureter | Laterality: Left | Wound class: Clean Contaminated

## 2017-05-24 MED ORDER — IPRATROPIUM-ALBUTEROL 0.5-2.5 (3) MG/3ML IN SOLN
RESPIRATORY_TRACT | Status: AC
  Administered 2017-05-24: 3 mL via RESPIRATORY_TRACT
  Filled 2017-05-24: qty 3

## 2017-05-24 MED ORDER — LIDOCAINE HCL (PF) 2 % IJ SOLN
INTRAMUSCULAR | Status: AC
  Filled 2017-05-24: qty 10

## 2017-05-24 MED ORDER — PROPOFOL 10 MG/ML IV BOLUS
INTRAVENOUS | Status: AC
  Filled 2017-05-24: qty 20

## 2017-05-24 MED ORDER — MIDAZOLAM HCL 2 MG/2ML IJ SOLN
INTRAMUSCULAR | Status: DC | PRN
  Administered 2017-05-24: 1 mg via INTRAVENOUS

## 2017-05-24 MED ORDER — SUCCINYLCHOLINE CHLORIDE 20 MG/ML IJ SOLN
INTRAMUSCULAR | Status: AC
  Filled 2017-05-24: qty 1

## 2017-05-24 MED ORDER — SEVOFLURANE IN SOLN
RESPIRATORY_TRACT | Status: AC
  Filled 2017-05-24: qty 250

## 2017-05-24 MED ORDER — TAMSULOSIN HCL 0.4 MG PO CAPS: 0.4000 mg | ORAL_CAPSULE | Freq: Every day | ORAL | 0 refills | Status: DC

## 2017-05-24 MED ORDER — INSULIN ASPART 100 UNIT/ML ~~LOC~~ SOLN
5.0000 [IU] | Freq: Once | SUBCUTANEOUS | Status: AC
  Administered 2017-05-24: 5 [IU] via SUBCUTANEOUS

## 2017-05-24 MED ORDER — SUGAMMADEX SODIUM 500 MG/5ML IV SOLN
INTRAVENOUS | Status: DC | PRN
  Administered 2017-05-24: 179.6 mg via INTRAVENOUS

## 2017-05-24 MED ORDER — DEXAMETHASONE SODIUM PHOSPHATE 10 MG/ML IJ SOLN
INTRAMUSCULAR | Status: AC
  Filled 2017-05-24: qty 1

## 2017-05-24 MED ORDER — SODIUM CHLORIDE 0.9 % IV SOLN
INTRAVENOUS | Status: DC
  Administered 2017-05-24: 12:00:00 via INTRAVENOUS

## 2017-05-24 MED ORDER — PROPOFOL 500 MG/50ML IV EMUL
INTRAVENOUS | Status: AC
  Filled 2017-05-24: qty 50

## 2017-05-24 MED ORDER — OXYBUTYNIN CHLORIDE 5 MG PO TABS: 5.0000 mg | ORAL_TABLET | Freq: Three times a day (TID) | ORAL | 0 refills | Status: DC | PRN

## 2017-05-24 MED ORDER — IPRATROPIUM-ALBUTEROL 0.5-2.5 (3) MG/3ML IN SOLN
3.0000 mL | Freq: Once | RESPIRATORY_TRACT | Status: AC
  Administered 2017-05-24: 3 mL via RESPIRATORY_TRACT

## 2017-05-24 MED ORDER — KETAMINE HCL 50 MG/ML IJ SOLN
INTRAMUSCULAR | Status: AC
  Filled 2017-05-24: qty 10

## 2017-05-24 MED ORDER — HYDROCODONE-ACETAMINOPHEN 5-325 MG PO TABS: 1.0000 | ORAL_TABLET | Freq: Four times a day (QID) | ORAL | 0 refills | Status: DC | PRN

## 2017-05-24 MED ORDER — ONDANSETRON HCL 4 MG/2ML IJ SOLN
INTRAMUSCULAR | Status: AC
Start: 2017-05-24 — End: ?
  Filled 2017-05-24: qty 2

## 2017-05-24 MED ORDER — INSULIN ASPART 100 UNIT/ML ~~LOC~~ SOLN
SUBCUTANEOUS | Status: AC
  Administered 2017-05-24: 5 [IU] via SUBCUTANEOUS
  Filled 2017-05-24: qty 1

## 2017-05-24 MED ORDER — DEXAMETHASONE SODIUM PHOSPHATE 10 MG/ML IJ SOLN
INTRAMUSCULAR | Status: DC | PRN
  Administered 2017-05-24: 5 mg via INTRAVENOUS

## 2017-05-24 MED ORDER — PROPOFOL 10 MG/ML IV BOLUS
INTRAVENOUS | Status: DC | PRN
  Administered 2017-05-24: 150 mg via INTRAVENOUS
  Administered 2017-05-24: 50 mg via INTRAVENOUS
  Administered 2017-05-24: 150 mg via INTRAVENOUS

## 2017-05-24 MED ORDER — LIDOCAINE HCL (CARDIAC) PF 100 MG/5ML IV SOSY
PREFILLED_SYRINGE | INTRAVENOUS | Status: DC | PRN
  Administered 2017-05-24: 50 mg via INTRAVENOUS

## 2017-05-24 MED ORDER — ROCURONIUM BROMIDE 50 MG/5ML IV SOLN
INTRAVENOUS | Status: AC
Start: 2017-05-24 — End: ?
  Filled 2017-05-24: qty 1

## 2017-05-24 MED ORDER — DOCUSATE SODIUM 100 MG PO CAPS: 100.0000 mg | ORAL_CAPSULE | Freq: Two times a day (BID) | ORAL | 0 refills | Status: DC

## 2017-05-24 MED ORDER — ACETAMINOPHEN 10 MG/ML IV SOLN
INTRAVENOUS | Status: AC
  Filled 2017-05-24: qty 100

## 2017-05-24 MED ORDER — MIDAZOLAM HCL 2 MG/2ML IJ SOLN
INTRAMUSCULAR | Status: AC
  Filled 2017-05-24: qty 2

## 2017-05-24 MED ORDER — ROCURONIUM BROMIDE 100 MG/10ML IV SOLN
INTRAVENOUS | Status: DC | PRN
  Administered 2017-05-24: 30 mg via INTRAVENOUS

## 2017-05-24 MED ORDER — FENTANYL CITRATE (PF) 100 MCG/2ML IJ SOLN: 25.0000 ug | INTRAMUSCULAR | Status: DC | PRN

## 2017-05-24 MED ORDER — IOTHALAMATE MEGLUMINE 43 % IV SOLN
INTRAVENOUS | Status: DC | PRN
  Administered 2017-05-24: 30 mL

## 2017-05-24 MED ORDER — ONDANSETRON HCL 4 MG/2ML IJ SOLN
INTRAMUSCULAR | Status: DC | PRN
  Administered 2017-05-24: 4 mg via INTRAVENOUS

## 2017-05-24 MED ORDER — FENTANYL CITRATE (PF) 100 MCG/2ML IJ SOLN
INTRAMUSCULAR | Status: DC | PRN
  Administered 2017-05-24: 25 ug via INTRAVENOUS

## 2017-05-24 MED ORDER — FENTANYL CITRATE (PF) 100 MCG/2ML IJ SOLN
INTRAMUSCULAR | Status: AC
  Filled 2017-05-24: qty 2

## 2017-05-24 MED ORDER — ACETAMINOPHEN 10 MG/ML IV SOLN
INTRAVENOUS | Status: DC | PRN
  Administered 2017-05-24: 1000 mg via INTRAVENOUS

## 2017-05-24 SURGICAL SUPPLY — 33 items
BAG DRAIN CYSTO-URO LG1000N (MISCELLANEOUS) ×4 IMPLANT
BASKET ZERO TIP 1.9FR (BASKET) ×4 IMPLANT
BRUSH SCRUB EZ  4% CHG (MISCELLANEOUS) ×2
BRUSH SCRUB EZ 1% IODOPHOR (MISCELLANEOUS) ×4 IMPLANT
BRUSH SCRUB EZ 4% CHG (MISCELLANEOUS) ×2 IMPLANT
CATH URETL 5X70 OPEN END (CATHETERS) ×4 IMPLANT
CNTNR SPEC 2.5X3XGRAD LEK (MISCELLANEOUS) ×2
CONRAY 43 FOR UROLOGY 50M (MISCELLANEOUS) ×4 IMPLANT
CONT SPEC 4OZ STER OR WHT (MISCELLANEOUS) ×2
CONTAINER SPEC 2.5X3XGRAD LEK (MISCELLANEOUS) ×2 IMPLANT
DRAPE UTILITY 15X26 TOWEL STRL (DRAPES) ×4 IMPLANT
FIBER LASER LITHO 273 (Laser) ×4 IMPLANT
GLIDEWIRE STIFF .35X180X3 HYDR (WIRE) ×4 IMPLANT
GLOVE BIO SURGEON STRL SZ 6.5 (GLOVE) ×3 IMPLANT
GLOVE BIO SURGEONS STRL SZ 6.5 (GLOVE) ×1
GOWN STRL REUS W/ TWL LRG LVL3 (GOWN DISPOSABLE) ×4 IMPLANT
GOWN STRL REUS W/TWL LRG LVL3 (GOWN DISPOSABLE) ×4
GUIDEWIRE GREEN .038 145CM (MISCELLANEOUS) ×4 IMPLANT
INFUSOR MANOMETER BAG 3000ML (MISCELLANEOUS) ×4 IMPLANT
INTRODUCER DILATOR DOUBLE (INTRODUCER) IMPLANT
KIT TURNOVER CYSTO (KITS) ×4 IMPLANT
PACK CYSTO AR (MISCELLANEOUS) ×4 IMPLANT
SENSORWIRE 0.038 NOT ANGLED (WIRE) ×8
SET CYSTO W/LG BORE CLAMP LF (SET/KITS/TRAYS/PACK) ×4 IMPLANT
SHEATH URETERAL 12FRX35CM (MISCELLANEOUS) IMPLANT
SOL .9 NS 3000ML IRR  AL (IV SOLUTION) ×2
SOL .9 NS 3000ML IRR UROMATIC (IV SOLUTION) ×2 IMPLANT
STENT URET 6FRX22 CONTOUR (STENTS) ×4 IMPLANT
STENT URET 6FRX24 CONTOUR (STENTS) IMPLANT
STENT URET 6FRX26 CONTOUR (STENTS) IMPLANT
SURGILUBE 2OZ TUBE FLIPTOP (MISCELLANEOUS) ×4 IMPLANT
WATER STERILE IRR 1000ML POUR (IV SOLUTION) ×4 IMPLANT
WIRE SENSOR 0.038 NOT ANGLED (WIRE) ×4 IMPLANT

## 2017-05-24 NOTE — Anesthesia Preprocedure Evaluation (Signed)
Anesthesia Evaluation  Patient identified by MRN, date of birth, ID band Patient awake    Reviewed: Allergy & Precautions, H&P , NPO status , Patient's Chart, lab work & pertinent test results  History of Anesthesia Complications Negative for: history of anesthetic complications  Airway Mallampati: III  TM Distance: <3 FB Neck ROM: limited    Dental  (+) Poor Dentition, Missing, Upper Dentures, Lower Dentures   Pulmonary neg shortness of breath, former smoker,           Cardiovascular Exercise Tolerance: Good hypertension,      Neuro/Psych PSYCHIATRIC DISORDERS Anxiety negative neurological ROS     GI/Hepatic Neg liver ROS, GERD  Medicated and Controlled,  Endo/Other  diabetes, Type 2  Renal/GU      Musculoskeletal   Abdominal   Peds  Hematology negative hematology ROS (+)   Anesthesia Other Findings Past Medical History: No date: Anxiety No date: Diabetes (HCC) No date: GERD (gastroesophageal reflux disease) No date: High blood pressure No date: High cholesterol  Past Surgical History: No date: ABDOMINAL HYSTERECTOMY No date: CHOLECYSTECTOMY  BMI    Body Mass Index:  37.41 kg/m      Reproductive/Obstetrics negative OB ROS                             Anesthesia Physical Anesthesia Plan  ASA: III  Anesthesia Plan: General ETT   Post-op Pain Management:    Induction: Intravenous  PONV Risk Score and Plan: Ondansetron, Midazolam, Dexamethasone and Treatment may vary due to age or medical condition  Airway Management Planned: Oral ETT  Additional Equipment:   Intra-op Plan:   Post-operative Plan: Extubation in OR  Informed Consent: I have reviewed the patients History and Physical, chart, labs and discussed the procedure including the risks, benefits and alternatives for the proposed anesthesia with the patient or authorized representative who has indicated his/her  understanding and acceptance.   Dental Advisory Given  Plan Discussed with: Anesthesiologist, CRNA and Surgeon  Anesthesia Plan Comments: (Patient consented for risks of anesthesia including but not limited to:  - adverse reactions to medications - damage to teeth, lips or other oral mucosa - sore throat or hoarseness - Damage to heart, brain, lungs or loss of life  Patient voiced understanding.)        Anesthesia Quick Evaluation

## 2017-05-24 NOTE — Interval H&P Note (Signed)
History and Physical Interval Note:  05/24/2017 11:58 AM  Kristina Archer  has presented today for surgery, with the diagnosis of left ureteral stone, hydronephrosis  The various methods of treatment have been discussed with the patient and family. After consideration of risks, benefits and other options for treatment, the patient has consented to  Procedure(s): CYSTOSCOPY/URETEROSCOPY/HOLMIUM LASER/STENT PLACEMENT (Left) CYSTOSCOPY WITH RETROGRADE PYELOGRAM (Bilateral) as a surgical intervention .  The patient's history has been reviewed, patient examined, no change in status, stable for surgery.  I have reviewed the patient's chart and labs.  Questions were answered to the patient's satisfaction.    RRR CTAB  Vanna Scotland

## 2017-05-24 NOTE — Anesthesia Post-op Follow-up Note (Signed)
Anesthesia QCDR form completed.        

## 2017-05-24 NOTE — Anesthesia Procedure Notes (Signed)
Procedure Name: Intubation Date/Time: 05/24/2017 12:23 PM Performed by: Carron Curie, CRNA Pre-anesthesia Checklist: Patient identified, Patient being monitored, Timeout performed, Emergency Drugs available and Suction available Patient Re-evaluated:Patient Re-evaluated prior to induction Oxygen Delivery Method: Circle system utilized Preoxygenation: Pre-oxygenation with 100% oxygen Induction Type: IV induction Ventilation: Mask ventilation without difficulty Laryngoscope Size: Mac and 3 Grade View: Grade I Tube type: Oral Tube size: 7.0 mm Number of attempts: 1 Airway Equipment and Method: Stylet Placement Confirmation: ETT inserted through vocal cords under direct vision,  positive ETCO2 and breath sounds checked- equal and bilateral Secured at: 21 cm Tube secured with: Tape Dental Injury: Teeth and Oropharynx as per pre-operative assessment

## 2017-05-24 NOTE — Transfer of Care (Signed)
Immediate Anesthesia Transfer of Care Note  Patient: Kristina Archer  Procedure(s) Performed: CYSTOSCOPY/URETEROSCOPY/HOLMIUM LASER/STENT PLACEMENT (Left Ureter) CYSTOSCOPY WITH RETROGRADE PYELOGRAM (Bilateral )  Patient Location: PACU  Anesthesia Type:General  Level of Consciousness: awake  Airway & Oxygen Therapy: Patient Spontanous Breathing  Post-op Assessment: Report given to RN  Post vital signs: stable  Last Vitals:  Vitals Value Taken Time  BP    Temp    Pulse 75 05/24/2017  1:32 PM  Resp    SpO2 91 % 05/24/2017  1:32 PM  Vitals shown include unvalidated device data.  Last Pain:  Vitals:   05/24/17 1133  TempSrc: Oral  PainSc: 0-No pain      Patients Stated Pain Goal: 0 (05/24/17 1133)  Complications: No apparent anesthesia complications

## 2017-05-24 NOTE — Discharge Instructions (Addendum)
You have a ureteral stent in place.  This is a tube that extends from your kidney to your bladder.  This may cause urinary bleeding, burning with urination, and urinary frequency.  Please call our office or present to the ED if you develop fevers >101 or pain which is not able to be controlled with oral pain medications.  You may be given either Flomax and/ or ditropan to help with bladder spasms and stent pain in addition to pain medications.   ° °[facility] °[address], [address] °[place], [place] [address] °[phone] ° ° ° °AMBULATORY SURGERY  °DISCHARGE INSTRUCTIONS ° ° °1) The drugs that you were given will stay in your system until tomorrow so for the next 24 hours you should not: ° °A) Drive an automobile °B) Make any legal decisions °C) Drink any alcoholic beverage ° ° °2) You may resume regular meals tomorrow.  Today it is better to start with liquids and gradually work up to solid foods. ° °You may eat anything you prefer, but it is better to start with liquids, then soup and crackers, and gradually work up to solid foods. ° ° °3) Please notify your doctor immediately if you have any unusual bleeding, trouble breathing, redness and pain at the surgery site, drainage, fever, or pain not relieved by medication. ° ° ° °4) Additional Instructions: ° ° ° ° ° ° ° °Please contact your physician with any problems or Same Day Surgery at [phone], Monday through Friday 6 am to 4 pm, or [facility] [place] Main number at [phone]. °

## 2017-05-24 NOTE — Op Note (Signed)
Date of procedure: 05/24/17  Preoperative diagnosis:  1. Left ureteral stones 2. Left hydronephrosis  Postoperative diagnosis:  1. Same as above  Procedure: 1. Left ureteroscopy with laser lithotripsy 2. Left retrograde pyelogram 3. Left basket extraction of stone fragment 4. Left ureteral stent placement  Surgeon: Vanna Scotland, MD  Anesthesia: General  Complications: None  Intraoperative findings: 2 large ureteral stones in the proximal left ureter.  More proximal of the 2 stones was relatively impacted with significant surrounding edema.  EBL: Minimal  Specimens: None  Drains: 6 x 22 French double-J ureteral stent on left  Indication: Kristina Archer is a 72 y.o. patient with .  After reviewing the management options for treatment, he elected to proceed with the above surgical procedure(s). We have discussed the potential benefits and risks of the procedure, side effects of the proposed treatment, the likelihood of the patient achieving the goals of the procedure, and any potential problems that might occur during the procedure or recuperation. Informed consent has been obtained.  Description of procedure:  The patient was taken to the operating room and general anesthesia was induced.  The patient was placed in the dorsal lithotomy position, prepped and draped in the usual sterile fashion, and preoperative antibiotics were administered. A preoperative time-out was performed.   A 21 French scope was advanced per urethra into the bladder.  Attention was turned to the left ureteral orifice was cannulated using a 5 [place] open-ended ureteral catheter.   the 2 stones on KUB measuring 6 and 7 mm were easily seen on scout imaging.  Gentle retrograde pyelogram was performed revealing a filling defect consistent with the more distal of the 2 proximal ureteral stones and minimal contrast above the more proximal of the 2 stones concerning for high-grade obstruction.  Then attempted to  advance a sensor wire unsuccessfully around the stones.  I then attempted to advance an angled Glidewire and was successfully getting around the first of the 2 stones but not the second.  Ultimately, I stop this in place as a safety wire and advanced 4.5 French semirigid ureteroscope up to the level of the first stone.  I fragmented the stone using a 273 m laser fiber and using settings of 0.8 J and 10 Hz.  I was able to then scope beyond this for stone where the angled Glidewire could be seen making a submucosal tunnel around the more proximal of the 2 stones.  The stone appeared to be heavily impacted with surrounding edema which was fairly significant.  I was able under direct visualization to snake a second sensor wire around the stone removing the submucosal angled Glidewire.  I then used the laser fiber to fragment the stone completely on a few of the smaller pieces refluxed up into the renal pelvis.  A final retrograde pyelogram revealed no contrast extravasation in the renal pelvis and upper tract collecting system filled normally but with hydronephrosis.  A second Super Stiff wire was then placed under direct visualization.  A 7 French flexible digital ureteroscope was then advanced over the Super Stiff wire into the collecting system.  Additional fragmentation of the stones which had retropulsed was performed until no significant stone burden remained.  Each every calyx was then directly visualized without any significant residual stone burden greater than 1 mm in diameter.  I then slowly withdrew the scope and several ureteral fragments were identified.  I then readvanced a 4.5 semirigid ureteroscope up the ureter and used a 1.9 [place] tipless  nitinol basket to extract all remaining stone fragments of the ureter.  Final retrograde pyelogram no contrast extravasation.  A 6 x 22 French double-J ureteral stent was advanced over the safety wire up to level the renal pelvis.  The wire was partially drawn  until focal is noted in the renal pelvis.  The wire was then fully withdrawn a focal is noted within the bladder.  The bladder was then drained.  The patient was then clean and dry, repositioned in supine position, reversed from anesthesia, and taken to the PACU in stable condition.  Plan: Plan for cystoscopy, stent removal in 2 weeks to allow for healing of the submucosal tunnel.  Vanna Scotland, M.D.

## 2017-05-24 NOTE — Anesthesia Postprocedure Evaluation (Signed)
Anesthesia Post Note  Patient: Kristina Archer  Procedure(s) Performed: CYSTOSCOPY/URETEROSCOPY/HOLMIUM LASER/STENT PLACEMENT (Left Ureter) CYSTOSCOPY WITH RETROGRADE PYELOGRAM (Bilateral )  Patient location during evaluation: PACU Anesthesia Type: General Level of consciousness: awake and alert Pain management: pain level controlled Vital Signs Assessment: post-procedure vital signs reviewed and stable Respiratory status: spontaneous breathing, nonlabored ventilation, respiratory function stable and patient connected to nasal cannula oxygen Cardiovascular status: blood pressure returned to baseline and stable Postop Assessment: no apparent nausea or vomiting Anesthetic complications: no     Last Vitals:  Vitals:   05/24/17 1420 05/24/17 1430  BP: (!) 146/71   Pulse: 79   Resp: 20   Temp:  36.4 C  SpO2: (!) 87%     Last Pain:  Vitals:   05/24/17 1400  TempSrc:   PainSc: 0-No pain                 Cleda Mccreedy Piscitello

## 2017-05-25 ENCOUNTER — Encounter: Payer: Self-pay | Admitting: Urology

## 2017-06-02 ENCOUNTER — Other Ambulatory Visit: Payer: Medicare HMO | Admitting: Urology

## 2017-06-09 ENCOUNTER — Encounter: Payer: Self-pay | Admitting: Urology

## 2017-06-09 ENCOUNTER — Ambulatory Visit (INDEPENDENT_AMBULATORY_CARE_PROVIDER_SITE_OTHER): Payer: Medicare HMO | Admitting: Urology

## 2017-06-09 VITALS — BP 135/79 | HR 101 | Ht 61.0 in | Wt 193.2 lb

## 2017-06-09 DIAGNOSIS — N201 Calculus of ureter: Secondary | ICD-10-CM | POA: Diagnosis not present

## 2017-06-09 DIAGNOSIS — N3001 Acute cystitis with hematuria: Secondary | ICD-10-CM

## 2017-06-09 DIAGNOSIS — R310 Gross hematuria: Secondary | ICD-10-CM | POA: Diagnosis not present

## 2017-06-09 DIAGNOSIS — N1330 Unspecified hydronephrosis: Secondary | ICD-10-CM | POA: Diagnosis not present

## 2017-06-09 LAB — URINALYSIS, COMPLETE
BILIRUBIN UA: NEGATIVE
GLUCOSE, UA: NEGATIVE
Ketones, UA: NEGATIVE
Nitrite, UA: POSITIVE — AB
Specific Gravity, UA: 1.02 (ref 1.005–1.030)
UUROB: 0.2 mg/dL (ref 0.2–1.0)
pH, UA: 5.5 (ref 5.0–7.5)

## 2017-06-09 LAB — MICROSCOPIC EXAMINATION: RBC, UA: >30 /hpf — ABNORMAL HIGH (ref 0–2)

## 2017-06-09 MED ORDER — SULFAMETHOXAZOLE-TRIMETHOPRIM 800-160 MG PO TABS: 1.0000 | ORAL_TABLET | Freq: Two times a day (BID) | ORAL | 0 refills | Status: DC

## 2017-06-09 NOTE — Progress Notes (Signed)
06/09/2017 9:51 AM   Kristina Archer October 03, 1945 [id number]  Referring provider: Corky Archer, [name], MD [address] [place], [place] [id number]  Chief Complaint  Patient presents with  . Cysto    HPI: 72 year old female who presents today for cystoscopy, stent removal following ureteroscopy on 5/15 2019 for 2 obstructing ureteral calculi.  Unfortunately today, her urine is floridly positive positive for nitrates, greater than 30 red blood cells as well as 30 white blood cells per high-power field with many bacteria.  She does endorse irritative voiding symptoms including dysuria, frequency and urgency which she thought was related to her stent.  No fevers or chills.  No significant flank pain other than with voiding.   PMH: Past Medical History:  Diagnosis Date  . Anxiety   . Diabetes (HCC)   . GERD (gastroesophageal reflux disease)   . High blood pressure   . High cholesterol     Surgical History: Past Surgical History:  Procedure Laterality Date  . ABDOMINAL HYSTERECTOMY    . CHOLECYSTECTOMY    . CYSTOSCOPY W/ RETROGRADES Bilateral 05/24/2017   Procedure: CYSTOSCOPY WITH RETROGRADE PYELOGRAM;  Surgeon: Kristina Archer, Kristina Fuhr, MD;  Location: [facility] ORS;  Service: Urology;  Laterality: Bilateral;  . CYSTOSCOPY/URETEROSCOPY/HOLMIUM LASER/STENT PLACEMENT Left 05/24/2017   Procedure: CYSTOSCOPY/URETEROSCOPY/HOLMIUM LASER/STENT PLACEMENT;  Surgeon: Kristina Archer, Kristina Scerbo, MD;  Location: [facility] ORS;  Service: Urology;  Laterality: Left;    Home Medications:  Allergies as of 06/09/2017      Reactions   Codeine Nausea Only      Medication List        Accurate as of 06/09/17  9:51 AM. Always use your most recent med list.          amLODipine-benazepril 5-10 MG capsule Commonly known as:  LOTREL Take 1 capsule by mouth daily.   CRESTOR 5 MG tablet Generic drug:  rosuvastatin Take 5 mg by mouth daily.   DEXILANT 60 MG capsule Generic drug:  dexlansoprazole Take 60 mg by mouth daily before  breakfast.   docusate sodium 100 MG capsule Commonly known as:  COLACE Take 1 capsule (100 mg total) by mouth 2 (two) times daily.   furosemide 20 MG tablet Commonly known as:  LASIX Take 20 mg by mouth daily.   glimepiride 2 MG tablet Commonly known as:  AMARYL Take 2 mg by mouth daily with breakfast.   HYDROcodone-acetaminophen 5-325 MG tablet Commonly known as:  NORCO/VICODIN Take 1-2 tablets by mouth every 6 (six) hours as needed for moderate pain.   metFORMIN 500 MG 24 hr tablet Commonly known as:  GLUCOPHAGE-XR Take 500 mg by mouth 2 (two) times daily.   multivitamin with minerals Tabs tablet Take 1 tablet by mouth daily.   oxybutynin 5 MG tablet Commonly known as:  DITROPAN Take 1 tablet (5 mg total) by mouth every 8 (eight) hours as needed for bladder spasms.   PARoxetine 20 MG tablet Commonly known as:  PAXIL Take 20 mg by mouth at bedtime.   sitaGLIPtin 100 MG tablet Commonly known as:  JANUVIA Take 100 mg by mouth daily.   tamsulosin 0.4 MG Caps capsule Commonly known as:  FLOMAX Take 1 capsule (0.4 mg total) by mouth daily.   TRUETRACK TEST test strip Generic drug:  glucose blood Use 2 (two) times daily. As instructed. DX 250.00       Allergies:  Allergies  Allergen Reactions  . Codeine Nausea Only    Family History: Family History  Problem Relation Age of Onset  .  Bladder Cancer Neg Hx   . Kidney cancer Neg Hx     Social History:  reports that she quit smoking about 20 years ago. Her smoking use included cigarettes. She has a 22.50 pack-year smoking history. She has never used smokeless tobacco. She reports that she drank alcohol. She reports that she does not use drugs.  ROS: UROLOGY Frequent Urination?: Yes Hard to postpone urination?: No Burning/pain with urination?: No Get up at night to urinate?: No Leakage of urine?: Yes Urine stream starts and stops?: No Trouble starting stream?: No Do you have to strain to urinate?:  No Blood in urine?: No Urinary tract infection?: No Sexually transmitted disease?: No Injury to kidneys or bladder?: No Painful intercourse?: No Weak stream?: No Currently pregnant?: No Vaginal bleeding?: No Last menstrual period?: n  Gastrointestinal Nausea?: No Vomiting?: No Indigestion/heartburn?: No Diarrhea?: No Constipation?: No  Constitutional Fever: No Night sweats?: No Weight loss?: No Fatigue?: No  Skin Skin rash/lesions?: No Itching?: No  Eyes Blurred vision?: No Double vision?: No  Ears/Nose/Throat Sore throat?: No Sinus problems?: No  Hematologic/Lymphatic Swollen glands?: No Easy bruising?: No  Cardiovascular Leg swelling?: No Chest pain?: No  Respiratory Cough?: No Shortness of breath?: No  Endocrine Excessive thirst?: No  Musculoskeletal Back pain?: No Joint pain?: No  Neurological Headaches?: No Dizziness?: No  Psychologic Depression?: No Anxiety?: No  Physical Exam: BP 135/79 (BP Location: Left Arm, Patient Position: Sitting, Cuff Size: Large)   Pulse (!) 101   Ht 5\' 1"  (1.549 m)   Wt 193 lb 3.2 oz (87.6 kg)   SpO2 99%   BMI 36.50 kg/m   Constitutional:  Alert and oriented, No acute distress. HEENT: [place] AT, moist mucus membranes.  Trachea midline, no masses. Cardiovascular: No clubbing, cyanosis, or edema. Respiratory: Normal respiratory effort, no increased work of breathing. Skin: No rashes, bruises or suspicious lesions. Neurologic: Grossly intact, no focal deficits, moving all 4 extremities. Psychiatric: Normal mood and affect.  Laboratory Data: Lab Results  Component Value Date   WBC 5.4 05/19/2017   HGB 13.6 05/19/2017   HCT 38.9 05/19/2017   MCV 84.2 05/19/2017   PLT 158 05/19/2017    Lab Results  Component Value Date   CREATININE 0.92 05/19/2017   Urinalysis UA reviewed, floridly positive as above, see epic for details  Pertinent Imaging: No new interval imaging  Assessment & Plan:    1. Left  ureteral stone Status post left ureteroscopy Defer stent removal today in the lumen of suspicious appearing urinalysis  2. Acute cystitis with hematuria UA today consistent with bacterial cystitis We will send culture We will go ahead and treat with Bactrim DS twice daily for 7 days and reschedule cystoscopy, stent removal for next week Warning symptoms reviewed - Urinalysis, Complete - CULTURE, URINE COMPREHENSIVE - sulfamethoxazole-trimethoprim (BACTRIM DS,SEPTRA DS) 800-160 MG tablet; Take 1 tablet by mouth every 12 (twelve) hours.  Dispense: 14 tablet; Refill: 0   Return in about 1 week (around 06/16/2017) for cysto/ stent removal next friday at 11:30.  Kristina Scotland, MD  [facility] [address] [place], [place] [address] [phone]

## 2017-06-12 ENCOUNTER — Telehealth: Payer: Self-pay

## 2017-06-12 LAB — CULTURE, URINE COMPREHENSIVE

## 2017-06-12 MED ORDER — NITROFURANTOIN MONOHYD MACRO 100 MG PO CAPS: 100.0000 mg | ORAL_CAPSULE | Freq: Two times a day (BID) | ORAL | 0 refills | Status: DC

## 2017-06-12 NOTE — Telephone Encounter (Signed)
-----   Message from Vanna ScotlandAshley Brandon, MD sent at 06/12/2017  8:28 AM EDT ----- This patient was prescribed Bactrim but the organism is resistant to this.  Unfortunately, it appears to be a highly resistant organism.  Lets try Macrobid 100 mg twice daily x10 days.  We will see what her urine looks like on Friday.  If she has fevers or worsening symptoms, she may need to be admitted for IV antibiotics.  Vanna ScotlandAshley Brandon, MD

## 2017-06-12 NOTE — Telephone Encounter (Signed)
Patient notified and script sent to pharm 

## 2017-06-13 ENCOUNTER — Ambulatory Visit: Payer: Self-pay | Admitting: Urology

## 2017-06-16 ENCOUNTER — Telehealth: Payer: Self-pay | Admitting: Urology

## 2017-06-16 ENCOUNTER — Encounter: Payer: Self-pay | Admitting: Urology

## 2017-06-16 ENCOUNTER — Ambulatory Visit (INDEPENDENT_AMBULATORY_CARE_PROVIDER_SITE_OTHER): Payer: Medicare HMO | Admitting: Urology

## 2017-06-16 VITALS — BP 133/83 | HR 101 | Ht 61.0 in | Wt 195.0 lb

## 2017-06-16 DIAGNOSIS — N201 Calculus of ureter: Secondary | ICD-10-CM | POA: Diagnosis not present

## 2017-06-16 DIAGNOSIS — N3001 Acute cystitis with hematuria: Secondary | ICD-10-CM

## 2017-06-16 LAB — MICROSCOPIC EXAMINATION: WBC, UA: >30 /hpf — ABNORMAL HIGH (ref 0–5)

## 2017-06-16 LAB — URINALYSIS, COMPLETE
BILIRUBIN UA: NEGATIVE
KETONES UA: NEGATIVE
Nitrite, UA: NEGATIVE
SPEC GRAV UA: 1.02 (ref 1.005–1.030)
Urobilinogen, Ur: 0.2 mg/dL (ref 0.2–1.0)
pH, UA: 5.5 (ref 5.0–7.5)

## 2017-06-16 MED ORDER — CIPROFLOXACIN HCL 500 MG PO TABS: 500.0000 mg | ORAL_TABLET | Freq: Once | ORAL | Status: DC

## 2017-06-16 MED ORDER — LIDOCAINE HCL URETHRAL/MUCOSAL 2 % EX GEL: 1.0000 "application " | Freq: Once | CUTANEOUS | Status: DC

## 2017-06-16 NOTE — Telephone Encounter (Signed)
Unfortunately, this urine was dumped prior to culture being ordered/sent today.  Prior to being able to remove her stent, she will need to have a negative urine culture.    Please ask her to come in Monday morning and repeat the culture.    Please offer my sincerest apologies for this, discussed ordering the culture during clinic and did not realize that it was not done until just after 5 PM.  Vanna ScotlandAshley Irisha Grandmaison, MD

## 2017-06-16 NOTE — Progress Notes (Signed)
06/16/2017 5:18 PM   Kristina Archer May 10, 1945 [id number]  Referring provider: Corky Archer, [name], MD [address] [place], [place] [id number]  Chief Complaint  Patient presents with  . Cysto    HPI: 72 year old female who presents today for cystoscopy, stent removal following ureteroscopy on 05/24/2017 for 2 obstructing ureteral calculi.   She does endorse irritative voiding symptoms including dysuria, frequency and urgency which she thought was related to her stent.  No significant flank pain other than with voiding.  She was seen on 06/09/2017 for cystoscopy, stent removal, however her urine was positive for nitrates.  She ultimately grew E. coli which was most consistent with ESBL.  She has been taking Macrobid since then.  Today, her UA remains somewhat suspicious, however, is no longer nitrite positive which is reassuring.  No fevers or chills.   PMH: Past Medical History:  Diagnosis Date  . Anxiety   . Diabetes (HCC)   . GERD (gastroesophageal reflux disease)   . High blood pressure   . High cholesterol     Surgical History: Past Surgical History:  Procedure Laterality Date  . ABDOMINAL HYSTERECTOMY    . CHOLECYSTECTOMY    . CYSTOSCOPY W/ RETROGRADES Bilateral 05/24/2017   Procedure: CYSTOSCOPY WITH RETROGRADE PYELOGRAM;  Surgeon: [name], Kristina Gorczyca, MD;  Location: [facility];  Service: Urology;  Laterality: Bilateral;  . CYSTOSCOPY/URETEROSCOPY/HOLMIUM LASER/STENT PLACEMENT Left 05/24/2017   Procedure: CYSTOSCOPY/URETEROSCOPY/HOLMIUM LASER/STENT PLACEMENT;  Surgeon: [name], Kristina Haberl, MD;  Location: [facility];  Service: Urology;  Laterality: Left;    Home Medications:  Allergies as of 06/16/2017      Reactions   Codeine Nausea Only      Medication List        Accurate as of 06/16/17  5:18 PM. Always use your most recent med list.          amLODipine-benazepril 5-10 MG capsule Commonly known as:  LOTREL Take 1 capsule by mouth daily.   CRESTOR 5 MG tablet Generic  drug:  rosuvastatin Take 5 mg by mouth daily.   DEXILANT 60 MG capsule Generic drug:  dexlansoprazole Take 60 mg by mouth daily before breakfast.   docusate sodium 100 MG capsule Commonly known as:  COLACE Take 1 capsule (100 mg total) by mouth 2 (two) times daily.   furosemide 20 MG tablet Commonly known as:  LASIX Take 20 mg by mouth daily.   glimepiride 2 MG tablet Commonly known as:  AMARYL Take 2 mg by mouth daily with breakfast.   HYDROcodone-acetaminophen 5-325 MG tablet Commonly known as:  NORCO/VICODIN Take 1-2 tablets by mouth every 6 (six) hours as needed for moderate pain.   metFORMIN 500 MG 24 hr tablet Commonly known as:  GLUCOPHAGE-XR Take 500 mg by mouth 2 (two) times daily.   multivitamin with minerals Tabs tablet Take 1 tablet by mouth daily.   nitrofurantoin (macrocrystal-monohydrate) 100 MG capsule Commonly known as:  MACROBID Take 1 capsule (100 mg total) by mouth every 12 (twelve) hours.   oxybutynin 5 MG tablet Commonly known as:  DITROPAN Take 1 tablet (5 mg total) by mouth every 8 (eight) hours as needed for bladder spasms.   PARoxetine 20 MG tablet Commonly known as:  PAXIL Take 20 mg by mouth at bedtime.   sitaGLIPtin 100 MG tablet Commonly known as:  JANUVIA Take 100 mg by mouth daily.   sulfamethoxazole-trimethoprim 800-160 MG tablet Commonly known as:  BACTRIM DS,SEPTRA DS Take 1 tablet by mouth every 12 (twelve) hours.   tamsulosin 0.4  MG Caps capsule Commonly known as:  FLOMAX Take 1 capsule (0.4 mg total) by mouth daily.   TRUETRACK TEST test strip Generic drug:  glucose blood Use 2 (two) times daily. As instructed. DX 250.00       Allergies:  Allergies  Allergen Reactions  . Codeine Nausea Only    Family History: Family History  Problem Relation Age of Onset  . Bladder Cancer Neg Hx   . Kidney cancer Neg Hx     Social History:  reports that she quit smoking about 20 years ago. Her smoking use included  cigarettes. She has a 22.50 pack-year smoking history. She has never used smokeless tobacco. She reports that she drank alcohol. She reports that she does not use drugs.  Physical Exam: BP 133/83   Pulse (!) 101   Ht 5\' 1"  (1.549 m)   Wt 195 lb (88.5 kg)   BMI 36.84 kg/m   Constitutional:  Alert and oriented, No acute distress. HEENT: [place] AT, moist mucus membranes.  Trachea midline, no masses. Cardiovascular: No clubbing, cyanosis, or edema. Respiratory: Normal respiratory effort, no increased work of breathing. Skin: No rashes, bruises or suspicious lesions. Neurologic: Grossly intact, no focal deficits, moving all 4 extremities. Psychiatric: Normal mood and affect.  Laboratory Data: Lab Results  Component Value Date   WBC 5.4 05/19/2017   HGB 13.6 05/19/2017   HCT 38.9 05/19/2017   MCV 84.2 05/19/2017   PLT 158 05/19/2017    Lab Results  Component Value Date   CREATININE 0.92 05/19/2017   Urinalysis Results for orders placed or performed in visit on 06/16/17  Microscopic Examination  Result Value Ref Range   WBC, UA >30 (H) 0 - 5 /hpf   RBC, UA 3-10 (A) 0 - 2 /hpf   Epithelial Cells (non renal) 0-10 0 - 10 /hpf   Mucus, UA Present (A) Not Estab.   Bacteria, UA Many (A) None seen/Few  Urinalysis, Complete  Result Value Ref Range   Specific Gravity, UA 1.020 1.005 - 1.030   pH, UA 5.5 5.0 - 7.5   Color, UA Yellow Yellow   Appearance Ur Clear Clear   Leukocytes, UA 1+ (A) Negative   Protein, UA 2+ (A) Negative/Trace   Glucose, UA Trace (A) Negative   Ketones, UA Negative Negative   RBC, UA 2+ (A) Negative   Bilirubin, UA Negative Negative   Urobilinogen, Ur 0.2 0.2 - 1.0 mg/dL   Nitrite, UA Negative Negative   Microscopic Examination See below:      Pertinent Imaging: No new interval imaging  Assessment & Plan:    1. Left ureteral stone/acute cystitis without hematuria Status post left ureteroscopy UA remains suspicious today albeit improved from  previous In light of recent  Highly resistant probable ESBL E. coli concerned that Macrobid may not be effective and she may require additional IV antibiotics to clear infection we will continue to hold off on stent removal today Due to increased concern for sepsis following stent removal if urine not adequately treated Repeat urine culture Will arrange for stent removal once we are able to clear this ESBL organism She is agreeable this plan  Vanna Scotland, MD  [facility] [address], [address] [place], [place] [address] [phone]

## 2017-06-19 ENCOUNTER — Other Ambulatory Visit: Payer: Medicare HMO

## 2017-06-19 ENCOUNTER — Telehealth: Payer: Self-pay | Admitting: Urology

## 2017-06-19 DIAGNOSIS — N3001 Acute cystitis with hematuria: Secondary | ICD-10-CM | POA: Diagnosis not present

## 2017-06-19 NOTE — Telephone Encounter (Signed)
Pt says she is taking generic Macrobid.  She only has 5 left.  She wants to know if she can take anything stronger.  Please give pt a call.

## 2017-06-19 NOTE — Telephone Encounter (Signed)
Patient returned call.  She will come to the office this afternoon to provide a urine sample.

## 2017-06-19 NOTE — Telephone Encounter (Signed)
lmom for pt to call office

## 2017-06-19 NOTE — Telephone Encounter (Signed)
Patient called requesting to speak with a nurse regarding urine culture. Patient is concerned that Macrobid is not strong enough. Explained to patient that to decrease risk of antibiotic resistance another antibiotic can not be prescribed until urine culture results are available. Questions answered to patient's satisfaction. Pt voices understanding.

## 2017-06-21 ENCOUNTER — Telehealth: Payer: Self-pay | Admitting: Urology

## 2017-06-21 ENCOUNTER — Encounter: Payer: Self-pay | Admitting: Urology

## 2017-06-21 ENCOUNTER — Ambulatory Visit (INDEPENDENT_AMBULATORY_CARE_PROVIDER_SITE_OTHER): Payer: Medicare HMO | Admitting: Urology

## 2017-06-21 VITALS — BP 162/75 | HR 98 | Ht 61.0 in | Wt 193.0 lb

## 2017-06-21 DIAGNOSIS — N201 Calculus of ureter: Secondary | ICD-10-CM | POA: Diagnosis not present

## 2017-06-21 LAB — CULTURE, URINE COMPREHENSIVE

## 2017-06-21 MED ORDER — LIDOCAINE HCL URETHRAL/MUCOSAL 2 % EX GEL
1.0000 "application " | Freq: Once | CUTANEOUS | Status: AC
  Administered 2017-06-21: 1 via URETHRAL

## 2017-06-21 MED ORDER — CIPROFLOXACIN HCL 500 MG PO TABS: 500.0000 mg | ORAL_TABLET | Freq: Once | ORAL | Status: DC

## 2017-06-21 NOTE — Progress Notes (Signed)
   06/21/17  CC:  Chief Complaint  Patient presents with  . Cysto Stent Removal    HPI: 72 year old female with 2 incidental large left ureteral stones status post ureteroscopy on 05/24/2017.  Her postoperative course was comp gated by urinary tract infection thus her stent removal has been delayed.  Her most recent urine culture from Monday is negative.  She is currently on Macrobid.  Blood pressure (!) 162/75, pulse 98, height 5\' 1"  (1.549 m), weight 193 lb (87.5 kg). NED. A&Ox3.   No respiratory distress   Abd soft, NT, ND Normal external genitalia with patent urethral meatus  Cystoscopy/ Stent removal procedure  Patient identification was confirmed, informed consent was obtained, and patient was prepped using Betadine solution.  Lidocaine jelly was administered per urethral meatus.    Preoperative abx where received prior to procedure.    Procedure: - Flexible cystoscope introduced, without any difficulty.   - Thorough search of the bladder revealed:    normal urethral meatus  Stent seen emanating from left ureteral orifice, grasped with stent graspers, and removed in entirety.    Post-Procedure: - Patient tolerated the procedure well   Assessment/ Plan:  1. Left ureteral stone Status post uncomplicated stent removal today Continue course of Macrobid Follow-up in 4 weeks with renal ultrasound prior Warning symptoms reviewed in detail - lidocaine (XYLOCAINE) 2 % jelly 1 application - US RENAL; Future   Vanna ScotlandAshley Tyron Manetta, MD

## 2017-06-21 NOTE — Telephone Encounter (Signed)
Patient will be here today at 3:30  [facility]

## 2017-06-21 NOTE — Telephone Encounter (Signed)
-----   Message from Vanna ScotlandAshley Brandon, MD sent at 06/21/2017  8:11 AM EDT ----- Please bring her in today (this afternoon at ~3:30 for cysto/ stent removal)

## 2017-07-21 ENCOUNTER — Ambulatory Visit
Admission: RE | Admit: 2017-07-21 | Discharge: 2017-07-21 | Disposition: A | Discharge status: Home / Self Care | Payer: Medicare HMO | Source: Ambulatory Visit | Attending: Urology | Admitting: Urology

## 2017-07-21 DIAGNOSIS — N202 Calculus of kidney with calculus of ureter: Secondary | ICD-10-CM | POA: Insufficient documentation

## 2017-07-21 DIAGNOSIS — N201 Calculus of ureter: Secondary | ICD-10-CM | POA: Diagnosis present

## 2017-07-21 DIAGNOSIS — N200 Calculus of kidney: Secondary | ICD-10-CM | POA: Diagnosis not present

## 2017-07-24 ENCOUNTER — Ambulatory Visit: Payer: Medicare HMO | Admitting: Urology

## 2017-11-02 ENCOUNTER — Emergency Department: Payer: Medicare HMO

## 2017-11-02 ENCOUNTER — Other Ambulatory Visit: Payer: Self-pay

## 2017-11-02 ENCOUNTER — Encounter: Payer: Self-pay | Admitting: *Deleted

## 2017-11-02 ENCOUNTER — Inpatient Hospital Stay: Payer: Medicare HMO | Admitting: Certified Registered Nurse Anesthetist

## 2017-11-02 ENCOUNTER — Inpatient Hospital Stay
Admission: EM | Admit: 2017-11-02 | Discharge: 2017-11-13 | DRG: 853 | Disposition: A | Discharge status: Home Health | Payer: Medicare HMO | Attending: Internal Medicine | Admitting: Internal Medicine

## 2017-11-02 ENCOUNTER — Encounter
Admission: EM | Disposition: A | Discharge status: Home Health | Payer: Self-pay | Source: Home / Self Care | Attending: Internal Medicine

## 2017-11-02 DIAGNOSIS — F329 Major depressive disorder, single episode, unspecified: Secondary | ICD-10-CM | POA: Diagnosis present

## 2017-11-02 DIAGNOSIS — R000 Tachycardia, unspecified: Secondary | ICD-10-CM | POA: Diagnosis not present

## 2017-11-02 DIAGNOSIS — E7800 Pure hypercholesterolemia, unspecified: Secondary | ICD-10-CM | POA: Diagnosis not present

## 2017-11-02 DIAGNOSIS — R7989 Other specified abnormal findings of blood chemistry: Secondary | ICD-10-CM

## 2017-11-02 DIAGNOSIS — N10 Acute tubulo-interstitial nephritis: Secondary | ICD-10-CM

## 2017-11-02 DIAGNOSIS — I110 Hypertensive heart disease with heart failure: Secondary | ICD-10-CM | POA: Diagnosis present

## 2017-11-02 DIAGNOSIS — Y92239 Unspecified place in hospital as the place of occurrence of the external cause: Secondary | ICD-10-CM | POA: Diagnosis not present

## 2017-11-02 DIAGNOSIS — I959 Hypotension, unspecified: Secondary | ICD-10-CM | POA: Diagnosis not present

## 2017-11-02 DIAGNOSIS — G4700 Insomnia, unspecified: Secondary | ICD-10-CM | POA: Diagnosis present

## 2017-11-02 DIAGNOSIS — Z6835 Body mass index (BMI) 35.0-35.9, adult: Secondary | ICD-10-CM

## 2017-11-02 DIAGNOSIS — T434X5A Adverse effect of butyrophenone and thiothixene neuroleptics, initial encounter: Secondary | ICD-10-CM | POA: Diagnosis not present

## 2017-11-02 DIAGNOSIS — I739 Peripheral vascular disease, unspecified: Secondary | ICD-10-CM | POA: Diagnosis not present

## 2017-11-02 DIAGNOSIS — J440 Chronic obstructive pulmonary disease with acute lower respiratory infection: Secondary | ICD-10-CM | POA: Diagnosis present

## 2017-11-02 DIAGNOSIS — I2510 Atherosclerotic heart disease of native coronary artery without angina pectoris: Secondary | ICD-10-CM | POA: Diagnosis present

## 2017-11-02 DIAGNOSIS — R1111 Vomiting without nausea: Secondary | ICD-10-CM | POA: Diagnosis not present

## 2017-11-02 DIAGNOSIS — I472 Ventricular tachycardia: Secondary | ICD-10-CM | POA: Diagnosis not present

## 2017-11-02 DIAGNOSIS — R0602 Shortness of breath: Secondary | ICD-10-CM | POA: Diagnosis not present

## 2017-11-02 DIAGNOSIS — Z87891 Personal history of nicotine dependence: Secondary | ICD-10-CM | POA: Diagnosis not present

## 2017-11-02 DIAGNOSIS — A419 Sepsis, unspecified organism: Principal | ICD-10-CM | POA: Diagnosis present

## 2017-11-02 DIAGNOSIS — Z79891 Long term (current) use of opiate analgesic: Secondary | ICD-10-CM

## 2017-11-02 DIAGNOSIS — I447 Left bundle-branch block, unspecified: Secondary | ICD-10-CM | POA: Diagnosis not present

## 2017-11-02 DIAGNOSIS — Z1612 Extended spectrum beta lactamase (ESBL) resistance: Secondary | ICD-10-CM | POA: Diagnosis present

## 2017-11-02 DIAGNOSIS — I471 Supraventricular tachycardia: Secondary | ICD-10-CM | POA: Diagnosis not present

## 2017-11-02 DIAGNOSIS — I21A1 Myocardial infarction type 2: Secondary | ICD-10-CM | POA: Diagnosis not present

## 2017-11-02 DIAGNOSIS — I509 Heart failure, unspecified: Secondary | ICD-10-CM | POA: Diagnosis not present

## 2017-11-02 DIAGNOSIS — I493 Ventricular premature depolarization: Secondary | ICD-10-CM | POA: Diagnosis not present

## 2017-11-02 DIAGNOSIS — Z66 Do not resuscitate: Secondary | ICD-10-CM | POA: Diagnosis present

## 2017-11-02 DIAGNOSIS — Z885 Allergy status to narcotic agent status: Secondary | ICD-10-CM

## 2017-11-02 DIAGNOSIS — Z23 Encounter for immunization: Secondary | ICD-10-CM

## 2017-11-02 DIAGNOSIS — Z1619 Resistance to other specified beta lactam antibiotics: Secondary | ICD-10-CM | POA: Diagnosis present

## 2017-11-02 DIAGNOSIS — I340 Nonrheumatic mitral (valve) insufficiency: Secondary | ICD-10-CM | POA: Diagnosis not present

## 2017-11-02 DIAGNOSIS — Z5329 Procedure and treatment not carried out because of patient's decision for other reasons: Secondary | ICD-10-CM | POA: Diagnosis present

## 2017-11-02 DIAGNOSIS — J441 Chronic obstructive pulmonary disease with (acute) exacerbation: Secondary | ICD-10-CM | POA: Diagnosis present

## 2017-11-02 DIAGNOSIS — N201 Calculus of ureter: Secondary | ICD-10-CM | POA: Diagnosis not present

## 2017-11-02 DIAGNOSIS — I429 Cardiomyopathy, unspecified: Secondary | ICD-10-CM | POA: Diagnosis not present

## 2017-11-02 DIAGNOSIS — I5023 Acute on chronic systolic (congestive) heart failure: Secondary | ICD-10-CM

## 2017-11-02 DIAGNOSIS — R1011 Right upper quadrant pain: Secondary | ICD-10-CM | POA: Diagnosis not present

## 2017-11-02 DIAGNOSIS — N132 Hydronephrosis with renal and ureteral calculous obstruction: Secondary | ICD-10-CM | POA: Diagnosis not present

## 2017-11-02 DIAGNOSIS — N170 Acute kidney failure with tubular necrosis: Secondary | ICD-10-CM | POA: Diagnosis not present

## 2017-11-02 DIAGNOSIS — N12 Tubulo-interstitial nephritis, not specified as acute or chronic: Secondary | ICD-10-CM | POA: Diagnosis not present

## 2017-11-02 DIAGNOSIS — N200 Calculus of kidney: Secondary | ICD-10-CM | POA: Diagnosis not present

## 2017-11-02 DIAGNOSIS — F419 Anxiety disorder, unspecified: Secondary | ICD-10-CM | POA: Diagnosis not present

## 2017-11-02 DIAGNOSIS — N136 Pyonephrosis: Secondary | ICD-10-CM | POA: Diagnosis not present

## 2017-11-02 DIAGNOSIS — N1330 Unspecified hydronephrosis: Secondary | ICD-10-CM | POA: Diagnosis not present

## 2017-11-02 DIAGNOSIS — E876 Hypokalemia: Secondary | ICD-10-CM | POA: Diagnosis not present

## 2017-11-02 DIAGNOSIS — Z7984 Long term (current) use of oral hypoglycemic drugs: Secondary | ICD-10-CM

## 2017-11-02 DIAGNOSIS — I5021 Acute systolic (congestive) heart failure: Secondary | ICD-10-CM | POA: Diagnosis not present

## 2017-11-02 DIAGNOSIS — R062 Wheezing: Secondary | ICD-10-CM

## 2017-11-02 DIAGNOSIS — J9811 Atelectasis: Secondary | ICD-10-CM | POA: Diagnosis not present

## 2017-11-02 DIAGNOSIS — Z9049 Acquired absence of other specified parts of digestive tract: Secondary | ICD-10-CM

## 2017-11-02 DIAGNOSIS — R410 Disorientation, unspecified: Secondary | ICD-10-CM | POA: Diagnosis not present

## 2017-11-02 DIAGNOSIS — Z90710 Acquired absence of both cervix and uterus: Secondary | ICD-10-CM | POA: Diagnosis not present

## 2017-11-02 DIAGNOSIS — I4891 Unspecified atrial fibrillation: Secondary | ICD-10-CM | POA: Diagnosis not present

## 2017-11-02 DIAGNOSIS — N179 Acute kidney failure, unspecified: Secondary | ICD-10-CM | POA: Diagnosis not present

## 2017-11-02 DIAGNOSIS — Z8249 Family history of ischemic heart disease and other diseases of the circulatory system: Secondary | ICD-10-CM

## 2017-11-02 DIAGNOSIS — E119 Type 2 diabetes mellitus without complications: Secondary | ICD-10-CM | POA: Diagnosis not present

## 2017-11-02 DIAGNOSIS — Z79899 Other long term (current) drug therapy: Secondary | ICD-10-CM

## 2017-11-02 DIAGNOSIS — R140 Abdominal distension (gaseous): Secondary | ICD-10-CM | POA: Diagnosis not present

## 2017-11-02 DIAGNOSIS — Z01811 Encounter for preprocedural respiratory examination: Secondary | ICD-10-CM

## 2017-11-02 DIAGNOSIS — D649 Anemia, unspecified: Secondary | ICD-10-CM | POA: Diagnosis present

## 2017-11-02 DIAGNOSIS — M545 Low back pain: Secondary | ICD-10-CM | POA: Diagnosis not present

## 2017-11-02 DIAGNOSIS — Z87442 Personal history of urinary calculi: Secondary | ICD-10-CM

## 2017-11-02 DIAGNOSIS — J189 Pneumonia, unspecified organism: Secondary | ICD-10-CM | POA: Diagnosis present

## 2017-11-02 DIAGNOSIS — B001 Herpesviral vesicular dermatitis: Secondary | ICD-10-CM | POA: Diagnosis present

## 2017-11-02 DIAGNOSIS — D696 Thrombocytopenia, unspecified: Secondary | ICD-10-CM | POA: Diagnosis present

## 2017-11-02 DIAGNOSIS — R0603 Acute respiratory distress: Secondary | ICD-10-CM | POA: Diagnosis not present

## 2017-11-02 DIAGNOSIS — I10 Essential (primary) hypertension: Secondary | ICD-10-CM | POA: Diagnosis not present

## 2017-11-02 DIAGNOSIS — I480 Paroxysmal atrial fibrillation: Secondary | ICD-10-CM | POA: Diagnosis present

## 2017-11-02 DIAGNOSIS — B9620 Unspecified Escherichia coli [E. coli] as the cause of diseases classified elsewhere: Secondary | ICD-10-CM | POA: Diagnosis present

## 2017-11-02 DIAGNOSIS — I420 Dilated cardiomyopathy: Secondary | ICD-10-CM | POA: Diagnosis not present

## 2017-11-02 DIAGNOSIS — K219 Gastro-esophageal reflux disease without esophagitis: Secondary | ICD-10-CM | POA: Diagnosis not present

## 2017-11-02 DIAGNOSIS — E785 Hyperlipidemia, unspecified: Secondary | ICD-10-CM | POA: Diagnosis present

## 2017-11-02 DIAGNOSIS — J9601 Acute respiratory failure with hypoxia: Secondary | ICD-10-CM | POA: Diagnosis not present

## 2017-11-02 DIAGNOSIS — I248 Other forms of acute ischemic heart disease: Secondary | ICD-10-CM | POA: Diagnosis not present

## 2017-11-02 DIAGNOSIS — R040 Epistaxis: Secondary | ICD-10-CM | POA: Diagnosis not present

## 2017-11-02 DIAGNOSIS — R778 Other specified abnormalities of plasma proteins: Secondary | ICD-10-CM

## 2017-11-02 DIAGNOSIS — J432 Centrilobular emphysema: Secondary | ICD-10-CM | POA: Diagnosis not present

## 2017-11-02 DIAGNOSIS — E1165 Type 2 diabetes mellitus with hyperglycemia: Secondary | ICD-10-CM | POA: Diagnosis not present

## 2017-11-02 HISTORY — DX: Cardiomyopathy, unspecified: I42.9

## 2017-11-02 HISTORY — DX: Diverticulosis of intestine, part unspecified, without perforation or abscess without bleeding: K57.90

## 2017-11-02 HISTORY — DX: Other intervertebral disc degeneration, lumbar region without mention of lumbar back pain or lower extremity pain: M51.369

## 2017-11-02 HISTORY — PX: CYSTOSCOPY W/ URETERAL STENT PLACEMENT: SHX1429

## 2017-11-02 HISTORY — DX: Type 2 diabetes mellitus without complications: E11.9

## 2017-11-02 HISTORY — DX: Other intervertebral disc degeneration, lumbar region: M51.36

## 2017-11-02 HISTORY — DX: Left bundle-branch block, unspecified: I44.7

## 2017-11-02 HISTORY — DX: Sciatica, unspecified side: M54.30

## 2017-11-02 LAB — URINALYSIS, COMPLETE (UACMP) WITH MICROSCOPIC
Bilirubin Urine: NEGATIVE
Ketones, ur: 5 mg/dL — AB
NITRITE: POSITIVE — AB
PH: 5 (ref 5.0–8.0)
Protein, ur: 30 mg/dL — AB
SPECIFIC GRAVITY, URINE: 1.016 (ref 1.005–1.030)
WBC, UA: >50 WBC/hpf — ABNORMAL HIGH (ref 0–5)

## 2017-11-02 LAB — COMPREHENSIVE METABOLIC PANEL
ALT: 49 U/L — ABNORMAL HIGH (ref 0–44)
AST: 67 U/L — AB (ref 15–41)
Albumin: 4 g/dL (ref 3.5–5.0)
Alkaline Phosphatase: 98 U/L (ref 38–126)
Anion gap: 11 (ref 5–15)
BUN: 16 mg/dL (ref 8–23)
CO2: 21 mmol/L — ABNORMAL LOW (ref 22–32)
CREATININE: 1.28 mg/dL — AB (ref 0.44–1.00)
Calcium: 9.9 mg/dL (ref 8.9–10.3)
Chloride: 102 mmol/L (ref 98–111)
GFR calc Af Amer: 48 mL/min — ABNORMAL LOW (ref >60)
GFR, EST NON AFRICAN AMERICAN: 42 mL/min — AB (ref >60)
Glucose, Bld: 350 mg/dL — ABNORMAL HIGH (ref 70–99)
POTASSIUM: 3.6 mmol/L (ref 3.5–5.1)
Sodium: 134 mmol/L — ABNORMAL LOW (ref 135–145)
Total Bilirubin: 1.5 mg/dL — ABNORMAL HIGH (ref 0.3–1.2)
Total Protein: 7.4 g/dL (ref 6.5–8.1)

## 2017-11-02 LAB — CBC WITH DIFFERENTIAL/PLATELET
Abs Immature Granulocytes: 0.12 10*3/uL — ABNORMAL HIGH (ref 0.00–0.07)
BASOS PCT: 0 %
Basophils Absolute: 0 10*3/uL (ref 0.0–0.1)
EOS PCT: 0 %
Eosinophils Absolute: 0 10*3/uL (ref 0.0–0.5)
HCT: 38.9 % (ref 36.0–46.0)
Hemoglobin: 13.1 g/dL (ref 12.0–15.0)
Immature Granulocytes: 1 %
Lymphocytes Relative: 6 %
Lymphs Abs: 0.6 10*3/uL — ABNORMAL LOW (ref 0.7–4.0)
MCH: 28.3 pg (ref 26.0–34.0)
MCHC: 33.7 g/dL (ref 30.0–36.0)
MCV: 84 fL (ref 80.0–100.0)
MONO ABS: 0.8 10*3/uL (ref 0.1–1.0)
Monocytes Relative: 7 %
NEUTROS ABS: 9.9 10*3/uL — AB (ref 1.7–7.7)
Neutrophils Relative %: 86 %
Platelets: 125 10*3/uL — ABNORMAL LOW (ref 150–400)
RBC: 4.63 MIL/uL (ref 3.87–5.11)
RDW: 13.6 % (ref 11.5–15.5)
WBC: 11.4 10*3/uL — AB (ref 4.0–10.5)
nRBC: 0 % (ref 0.0–0.2)

## 2017-11-02 LAB — GLUCOSE, CAPILLARY: Glucose-Capillary: 384 mg/dL — ABNORMAL HIGH (ref 70–99)

## 2017-11-02 LAB — TROPONIN I
Troponin I: 0.09 ng/mL (ref <0.03)
Troponin I: 0.19 ng/mL (ref <0.03)
Troponin I: 0.25 ng/mL (ref <0.03)

## 2017-11-02 LAB — LIPASE, BLOOD: LIPASE: 25 U/L (ref 11–51)

## 2017-11-02 SURGERY — CYSTOSCOPY, WITH RETROGRADE PYELOGRAM AND URETERAL STENT INSERTION
Anesthesia: General | Laterality: Right

## 2017-11-02 MED ORDER — ONDANSETRON HCL 4 MG/2ML IJ SOLN
4.0000 mg | Freq: Once | INTRAMUSCULAR | Status: AC
  Administered 2017-11-02: 4 mg via INTRAVENOUS
  Filled 2017-11-02: qty 2

## 2017-11-02 MED ORDER — INSULIN ASPART 100 UNIT/ML ~~LOC~~ SOLN
0.0000 [IU] | Freq: Three times a day (TID) | SUBCUTANEOUS | Status: DC
  Administered 2017-11-03: 9 [IU] via SUBCUTANEOUS
  Administered 2017-11-03: 3 [IU] via SUBCUTANEOUS
  Administered 2017-11-03: 5 [IU] via SUBCUTANEOUS
  Administered 2017-11-04 (×2): 7 [IU] via SUBCUTANEOUS
  Administered 2017-11-04: 5 [IU] via SUBCUTANEOUS
  Administered 2017-11-05: 9 [IU] via SUBCUTANEOUS
  Administered 2017-11-05: 7 [IU] via SUBCUTANEOUS
  Filled 2017-11-02 (×8): qty 1

## 2017-11-02 MED ORDER — ONDANSETRON HCL 4 MG PO TABS: 4.0000 mg | ORAL_TABLET | Freq: Four times a day (QID) | ORAL | Status: DC | PRN

## 2017-11-02 MED ORDER — SODIUM CHLORIDE 0.9 % IV SOLN
1.0000 g | Freq: Once | INTRAVENOUS | Status: AC
  Administered 2017-11-02: 1 g via INTRAVENOUS
  Filled 2017-11-02: qty 10

## 2017-11-02 MED ORDER — TRAZODONE HCL 50 MG PO TABS
25.0000 mg | ORAL_TABLET | Freq: Every evening | ORAL | Status: DC | PRN
  Administered 2017-11-02 – 2017-11-05 (×2): 25 mg via ORAL
  Filled 2017-11-02 (×2): qty 1

## 2017-11-02 MED ORDER — ONDANSETRON HCL 4 MG/2ML IJ SOLN: 4.0000 mg | Freq: Four times a day (QID) | INTRAMUSCULAR | Status: DC | PRN

## 2017-11-02 MED ORDER — FENTANYL CITRATE (PF) 100 MCG/2ML IJ SOLN
INTRAMUSCULAR | Status: AC
  Filled 2017-11-02: qty 2

## 2017-11-02 MED ORDER — ROSUVASTATIN CALCIUM 5 MG PO TABS
5.0000 mg | ORAL_TABLET | Freq: Every day | ORAL | Status: DC
  Administered 2017-11-03: 5 mg via ORAL
  Filled 2017-11-02 (×2): qty 1

## 2017-11-02 MED ORDER — FENTANYL CITRATE (PF) 100 MCG/2ML IJ SOLN
50.0000 ug | Freq: Once | INTRAMUSCULAR | Status: AC
  Administered 2017-11-02: 50 ug via INTRAVENOUS
  Filled 2017-11-02: qty 2

## 2017-11-02 MED ORDER — ONDANSETRON HCL 4 MG/2ML IJ SOLN: 4.0000 mg | Freq: Once | INTRAMUSCULAR | Status: DC | PRN

## 2017-11-02 MED ORDER — ACETAMINOPHEN 650 MG RE SUPP: 650.0000 mg | Freq: Four times a day (QID) | RECTAL | Status: DC | PRN

## 2017-11-02 MED ORDER — SODIUM CHLORIDE 0.9 % IV SOLN
1.0000 g | INTRAVENOUS | Status: DC
  Administered 2017-11-03 – 2017-11-04 (×2): 1 g via INTRAVENOUS
  Filled 2017-11-02: qty 10
  Filled 2017-11-02 (×2): qty 1

## 2017-11-02 MED ORDER — DOCUSATE SODIUM 100 MG PO CAPS
100.0000 mg | ORAL_CAPSULE | Freq: Two times a day (BID) | ORAL | Status: DC
  Administered 2017-11-02 – 2017-11-13 (×19): 100 mg via ORAL
  Filled 2017-11-02 (×21): qty 1

## 2017-11-02 MED ORDER — FENTANYL CITRATE (PF) 100 MCG/2ML IJ SOLN: 25.0000 ug | INTRAMUSCULAR | Status: DC | PRN

## 2017-11-02 MED ORDER — PANTOPRAZOLE SODIUM 40 MG PO TBEC
40.0000 mg | DELAYED_RELEASE_TABLET | Freq: Every day | ORAL | Status: DC
  Administered 2017-11-03 – 2017-11-13 (×11): 40 mg via ORAL
  Filled 2017-11-02 (×11): qty 1

## 2017-11-02 MED ORDER — PHENYLEPHRINE HCL 10 MG/ML IJ SOLN
INTRAMUSCULAR | Status: DC | PRN
  Administered 2017-11-02: 100 ug via INTRAVENOUS

## 2017-11-02 MED ORDER — PAROXETINE HCL 20 MG PO TABS
20.0000 mg | ORAL_TABLET | Freq: Every day | ORAL | Status: DC
  Administered 2017-11-02 – 2017-11-12 (×9): 20 mg via ORAL
  Filled 2017-11-02 (×12): qty 1

## 2017-11-02 MED ORDER — INFLUENZA VAC SPLIT HIGH-DOSE 0.5 ML IM SUSY
0.5000 mL | PREFILLED_SYRINGE | INTRAMUSCULAR | Status: AC
  Administered 2017-11-03: 0.5 mL via INTRAMUSCULAR
  Filled 2017-11-02: qty 0.5

## 2017-11-02 MED ORDER — SODIUM CHLORIDE 0.9 % IV SOLN
INTRAVENOUS | Status: DC
  Administered 2017-11-02 – 2017-11-03 (×2): via INTRAVENOUS

## 2017-11-02 MED ORDER — BISACODYL 5 MG PO TBEC: 5.0000 mg | DELAYED_RELEASE_TABLET | Freq: Every day | ORAL | Status: DC | PRN

## 2017-11-02 MED ORDER — PROPOFOL 10 MG/ML IV BOLUS
INTRAVENOUS | Status: AC
  Filled 2017-11-02: qty 20

## 2017-11-02 MED ORDER — SODIUM CHLORIDE 0.9 % IV BOLUS
500.0000 mL | Freq: Once | INTRAVENOUS | Status: AC
  Administered 2017-11-02: 500 mL via INTRAVENOUS

## 2017-11-02 MED ORDER — DOCUSATE SODIUM 100 MG PO CAPS: 100.0000 mg | ORAL_CAPSULE | Freq: Two times a day (BID) | ORAL | Status: DC

## 2017-11-02 MED ORDER — ORAL CARE MOUTH RINSE
15.0000 mL | Freq: Two times a day (BID) | OROMUCOSAL | Status: DC
  Administered 2017-11-02 – 2017-11-12 (×11): 15 mL via OROMUCOSAL

## 2017-11-02 MED ORDER — LIDOCAINE HCL (CARDIAC) PF 100 MG/5ML IV SOSY
PREFILLED_SYRINGE | INTRAVENOUS | Status: DC | PRN
  Administered 2017-11-02: 50 mg via INTRAVENOUS

## 2017-11-02 MED ORDER — ACETAMINOPHEN 325 MG PO TABS
650.0000 mg | ORAL_TABLET | Freq: Four times a day (QID) | ORAL | Status: DC | PRN
  Administered 2017-11-02 – 2017-11-12 (×5): 650 mg via ORAL
  Filled 2017-11-02 (×5): qty 2

## 2017-11-02 MED ORDER — PROPOFOL 10 MG/ML IV BOLUS
INTRAVENOUS | Status: DC | PRN
  Administered 2017-11-02: 40 mg via INTRAVENOUS
  Administered 2017-11-02: 160 mg via INTRAVENOUS

## 2017-11-02 MED ORDER — ENOXAPARIN SODIUM 40 MG/0.4ML ~~LOC~~ SOLN
40.0000 mg | SUBCUTANEOUS | Status: DC
  Administered 2017-11-03 – 2017-11-04 (×2): 40 mg via SUBCUTANEOUS
  Filled 2017-11-02 (×2): qty 0.4

## 2017-11-02 MED ORDER — FENTANYL CITRATE (PF) 100 MCG/2ML IJ SOLN
INTRAMUSCULAR | Status: DC | PRN
  Administered 2017-11-02: 75 ug via INTRAVENOUS
  Administered 2017-11-02: 25 ug via INTRAVENOUS

## 2017-11-02 SURGICAL SUPPLY — 19 items
BAG DRAIN CYSTO-URO LG1000N (MISCELLANEOUS) ×6 IMPLANT
BRUSH SCRUB EZ  4% CHG (MISCELLANEOUS) ×2
BRUSH SCRUB EZ 4% CHG (MISCELLANEOUS) ×1 IMPLANT
CATH URETL 5X70 OPEN END (CATHETERS) ×3 IMPLANT
CONRAY 43 FOR UROLOGY 50M (MISCELLANEOUS) ×3 IMPLANT
DRAPE UTILITY 15X26 TOWEL STRL (DRAPES) ×3 IMPLANT
GOWN STRL REUS W/ TWL LRG LVL3 (GOWN DISPOSABLE) ×1 IMPLANT
GOWN STRL REUS W/TWL LRG LVL3 (GOWN DISPOSABLE) ×2
KIT TURNOVER CYSTO (KITS) ×3 IMPLANT
PACK CYSTO AR (MISCELLANEOUS) ×3 IMPLANT
SENSORWIRE 0.038 NOT ANGLED (WIRE)
SET CYSTO W/LG BORE CLAMP LF (SET/KITS/TRAYS/PACK) ×3 IMPLANT
SLEEVE SCD COMPRESS THIGH MED (MISCELLANEOUS) ×3 IMPLANT
SOL .9 NS 3000ML IRR  AL (IV SOLUTION) ×2
SOL .9 NS 3000ML IRR UROMATIC (IV SOLUTION) ×1 IMPLANT
STENT URET 6FRX24 CONTOUR (STENTS) ×3 IMPLANT
SURGILUBE 2OZ TUBE FLIPTOP (MISCELLANEOUS) ×3 IMPLANT
WATER STERILE IRR 1000ML POUR (IV SOLUTION) ×3 IMPLANT
WIRE SENSOR 0.038 NOT ANGLED (WIRE) IMPLANT

## 2017-11-02 NOTE — ED Notes (Signed)
Report    Phoned  To  Brandy   rn   Pt remains  Npo

## 2017-11-02 NOTE — ED Provider Notes (Addendum)
[facility] Emergency Department Provider Note  ____________________________________________   I have reviewed the triage vital signs and the nursing notes. Where available I have reviewed prior notes and, if possible and indicated, outside hospital notes.    HISTORY  Chief Complaint Back Pain    HPI Kristina Archer is a 72 y.o. female  with a history ofkidney stones, anxiety reflux I cholesterol, states she had right flank pain radiating to her groin, started suddenly last night. Positive vomiting. No fever no chills no dysuria no urinary frequency no other symptoms, did have some possibly mildly loose stool. This does feel somewhat prior kidney stones. No numbness or weakness no other complaints. The pain is sharp. Minimal this time. Denies Other alleviating or aggravating factors or prior treatment.   Past Medical History:  Diagnosis Date  . Anxiety   . Diabetes (HCC)   . GERD (gastroesophageal reflux disease)   . High blood pressure   . High cholesterol     There are no active problems to display for this patient.   Past Surgical History:  Procedure Laterality Date  . ABDOMINAL HYSTERECTOMY    . CHOLECYSTECTOMY    . CYSTOSCOPY W/ RETROGRADES Bilateral 05/24/2017   Procedure: CYSTOSCOPY WITH RETROGRADE PYELOGRAM;  Surgeon: Vanna Scotland, MD;  Location: [facility];  Service: Urology;  Laterality: Bilateral;  . CYSTOSCOPY/URETEROSCOPY/HOLMIUM LASER/STENT PLACEMENT Left 05/24/2017   Procedure: CYSTOSCOPY/URETEROSCOPY/HOLMIUM LASER/STENT PLACEMENT;  Surgeon: Vanna Scotland, MD;  Location: [facility];  Service: Urology;  Laterality: Left;    Prior to Admission medications   Medication Sig Start Date End Date Taking? Authorizing Provider  amLODipine-benazepril (LOTREL) 5-10 MG capsule Take 1 capsule by mouth daily.    [provider]  dexlansoprazole (DEXILANT) 60 MG capsule Take 60 mg by mouth daily before breakfast.    [provider]   docusate sodium (COLACE) 100 MG capsule Take 1 capsule (100 mg total) by mouth 2 (two) times daily. 05/24/17   Vanna Scotland, MD  furosemide (LASIX) 20 MG tablet Take 20 mg by mouth daily.    [provider]  glimepiride (AMARYL) 2 MG tablet Take 2 mg by mouth daily with breakfast.    [provider]  glucose blood (TRUETRACK TEST) test strip Use 2 (two) times daily. As instructed. DX 250.00 07/26/13   [provider]  HYDROcodone-acetaminophen (NORCO/VICODIN) 5-325 MG tablet Take 1-2 tablets by mouth every 6 (six) hours as needed for moderate pain. 05/24/17   Vanna Scotland, MD  metFORMIN (GLUCOPHAGE-XR) 500 MG 24 hr tablet Take 500 mg by mouth 2 (two) times daily.    [provider]  Multiple Vitamin (MULTIVITAMIN WITH MINERALS) TABS tablet Take 1 tablet by mouth daily.    [provider]  nitrofurantoin, macrocrystal-monohydrate, (MACROBID) 100 MG capsule Take 1 capsule (100 mg total) by mouth every 12 (twelve) hours. 06/12/17   Vanna Scotland, MD  oxybutynin (DITROPAN) 5 MG tablet Take 1 tablet (5 mg total) by mouth every 8 (eight) hours as needed for bladder spasms. 05/24/17   Vanna Scotland, MD  PARoxetine (PAXIL) 20 MG tablet Take 20 mg by mouth at bedtime.    [provider]  rosuvastatin (CRESTOR) 5 MG tablet Take 5 mg by mouth daily.  06/25/14   [provider]  sitaGLIPtin (JANUVIA) 100 MG tablet Take 100 mg by mouth daily.    [provider]  sulfamethoxazole-trimethoprim (BACTRIM DS,SEPTRA DS) 800-160 MG tablet Take 1 tablet by mouth every 12 (twelve) hours. 06/09/17  Vanna Scotland, MD  tamsulosin (FLOMAX) 0.4 MG CAPS capsule Take 1 capsule (0.4 mg total) by mouth daily. 05/24/17   Vanna Scotland, MD    Allergies Codeine  Family History  Problem Relation Age of Onset  . Bladder Cancer Neg Hx   . Kidney cancer Neg Hx     Social History Social History   Tobacco Use  . Smoking status: Former Smoker     Packs/day: 1.50    Years: 15.00    Pack years: 22.50    Types: Cigarettes    Last attempt to quit: 05/12/1997    Years since quitting: 20.4  . Smokeless tobacco: Never Used  Substance Use Topics  . Alcohol use: Not Currently  . Drug use: Never    Review of Systems Constitutional: No fever/chills Eyes: No visual changes. ENT: No sore throat. No stiff neck no neck pain Cardiovascular: Denies chest pain. Respiratory: Denies shortness of breath. Gastrointestinal:   she history of present illnessGenitourinary: Negative for dysuria. Musculoskeletal: Negative lower extremity swelling Skin: Negative for rash. Neurological: Negative for severe headaches, focal weakness or numbness.   ____________________________________________   PHYSICAL EXAM:  VITAL SIGNS: ED Triage Vitals  Enc Vitals Group     BP 11/02/17 1318 127/76     Pulse Rate 11/02/17 1318 (!) 107     Resp 11/02/17 1318 18     Temp 11/02/17 1318 98.8 F (37.1 C)     Temp Source 11/02/17 1318 Oral     SpO2 11/02/17 1318 94 %     Weight 11/02/17 1322 190 lb (86.2 kg)     Height 11/02/17 1322 5\' 3"  (1.6 m)     Head Circumference --      Peak Flow --      Pain Score 11/02/17 1322 8     Pain Loc --      Pain Edu? --      Excl. in GC? --     Constitutional: Alert and oriented. Well appearing and in no acute distress. Eyes: Conjunctivae are normal Head: Atraumatic HEENT: No congestion/rhinnorhea. Mucous membranes are moist.  Oropharynx non-erythematous Neck:   Nontender with no meningismus, no masses, no stridor Cardiovascular: Normal rate, regular rhythm. Grossly normal heart sounds.  Good peripheral circulation. Respiratory: Normal respiratory effort.  No retractions. Lungs CTAB. Abdominal: Soft and nontender. No distention. No guarding no rebound Back:  There is no focal tenderness or step off.  there is no midline tenderness there are no lesions noted. there is right-sided mildCVA tenderness Musculoskeletal: No  lower extremity tenderness, no upper extremity tenderness. No joint effusions, no DVT signs strong distal pulses no edema Neurologic:  Normal speech and language. No gross focal neurologic deficits are appreciated.  Skin:  Skin is warm, dry and intact. No rash noted. Psychiatric: Mood and affect are normal. Speech and behavior are normal.  ____________________________________________   LABS (all labs ordered are listed, but only abnormal results are displayed)  Labs Reviewed  COMPREHENSIVE METABOLIC PANEL  CBC WITH DIFFERENTIAL/PLATELET  TROPONIN I  LIPASE, BLOOD  URINALYSIS, COMPLETE (UACMP) WITH MICROSCOPIC    Pertinent labs  results that were available during my care of the patient were reviewed by me and considered in my medical decision making (see chart for details). ____________________________________________  EKG  I personally interpreted any EKGs ordered by me or triage old left bundle-branch block appreciated rate 106, sinus. ____________________________________________  RADIOLOGY  Pertinent labs & imaging results that were available during my care of the patient were  reviewed by me and considered in my medical decision making (see chart for details). If possible, patient and/or family made aware of any abnormal findings.  No results found. ____________________________________________    PROCEDURES  Procedure(s) performed: None  Procedures  Critical Care performed: CRITICAL CARE Performed by: Jeanmarie Plant   Total critical care time: 36 minutes  Critical care time was exclusive of separately billable procedures and treating other patients.  Critical care was necessary to treat or prevent imminent or life-threatening deterioration.  Critical care was time spent personally by me on the following activities: development of treatment plan with patient and/or surrogate as well as nursing, discussions with consultants, evaluation of patient's response to  treatment, examination of patient, obtaining history from patient or surrogate, ordering and performing treatments and interventions, ordering and review of laboratory studies, ordering and review of radiographic studies, pulse oximetry and re-evaluation of patient's condition.   ____________________________________________   INITIAL IMPRESSION / ASSESSMENT AND PLAN / ED COURSE  Pertinent labs & imaging results that were available during my care of the patient were reviewed by me and considered in my medical decision making (see chart for details).  patient here with flank pain radiating to her groin, vomiting history of kidney stones will flutter this kidney stone appendicitis and certainly consider the possibility of a low suspicion given her exam, I believe this likely represents ACS given the reproducible nature of the discomfort, however we did do an EKG which is unchanged essentially from prior. We will obtain CT scan to evaluate her for possible stone is also most likely thing we will reassess patient is in no acute distress right now she does know that she can receive more pain medications as needed  ----------------------------------------- 2:49 PM on 11/02/2017 -----------------------------------------  is requesting more pain medication which will give her she does not otherwise appear to be toxic fortunately she does have what appears to be urinary tract infection and a very large kidney stone with mild hydronephrosis on the right as Dr. Bufford Buttner. I did talk to Dr. Alvester Morin and I personally looked at the CT, Dr. Delmer Islam I discussed the patient's lab work, vital signs, history, radiology and urinalysis, he does agree with disposition and Rocephin. He thinks that the patient would best served on the medicine floor given positive troponin, he will follow with her for possible stent placement. Patient made aware of all findings and is in agreement with this plan. I did make Dr. Alvester Morin aware that the  patient was nothing by mouth since 10:00 this morning   ____________________________________________   FINAL CLINICAL IMPRESSION(S) / ED DIAGNOSES  Final diagnoses:  None      This chart was dictated using voice recognition software.  Despite best efforts to proofread,  errors can occur which can change meaning.      Jeanmarie Plant, MD 11/02/17 1347    Jeanmarie Plant, MD 11/02/17 1450

## 2017-11-02 NOTE — Anesthesia Postprocedure Evaluation (Signed)
Anesthesia Post Note  Patient: Kristina Archer  Procedure(s) Performed: CYSTOSCOPY WITH RETROGRADE PYELOGRAM/URETERAL STENT PLACEMENT (Right )  Patient location during evaluation: PACU Anesthesia Type: General Level of consciousness: awake and alert Pain management: pain level controlled Vital Signs Assessment: post-procedure vital signs reviewed and stable Respiratory status: spontaneous breathing, nonlabored ventilation, respiratory function stable and patient connected to nasal cannula oxygen Cardiovascular status: blood pressure returned to baseline and stable Postop Assessment: no apparent nausea or vomiting Anesthetic complications: no     Last Vitals:  Vitals:   11/02/17 1949 11/02/17 2002  BP: (!) 97/54 (!) 118/48  Pulse: 86 87  Resp: 16 (!) 24  Temp: 36.7 C 37.2 C  SpO2: 97% 94%    Last Pain:  Vitals:   11/02/17 2002  TempSrc: Oral  PainSc:                  Lenard Simmer

## 2017-11-02 NOTE — Consult Note (Signed)
H&P Physician requesting consult: Lenard Simmer, MD  Chief Complaint: Right ureteral calculus, UTI  History of Present Illness: Patient with Dr. Apolinar Junes status post left ureteroscopy with laser lithotripsy and ureteral stent that was complicated by urinary tract infection afterwards.  She has since had her stent removed.  This morning, she had severe right-sided flank pain prompting her to come to the emergency department.  CT scan was performed which revealed a 13 mm right proximal obstructing ureteral calculus with upstream hydronephrosis.  There was a moderate amount of stranding.  Urinalysis was consistent with possible urinary tract infection.  She is hemodynamically stable but tachycardic.  Troponin is mildly elevated.  Internal medicine has been consulted and I personally spoke with her about the elevated troponin.  Dr. Luberta Mutter evaluated the patient, EKG, and labs and cleared her for general anesthesia with the primary thought being that her elevated enzymes were not secondary to MI but rather secondary to the acute insult from the stone.  The patient herself denies chest pain or shortness of breath.  She does continue to have right-sided flank pain.  She has associated nausea.  No fever.  Past Medical History:  Diagnosis Date  . Anxiety   . Diabetes (HCC)   . GERD (gastroesophageal reflux disease)   . High blood pressure   . High cholesterol    Past Surgical History:  Procedure Laterality Date  . ABDOMINAL HYSTERECTOMY    . CHOLECYSTECTOMY    . CYSTOSCOPY W/ RETROGRADES Bilateral 05/24/2017   Procedure: CYSTOSCOPY WITH RETROGRADE PYELOGRAM;  Surgeon: Vanna Scotland, MD;  Location: [facility] ORS;  Service: Urology;  Laterality: Bilateral;  . CYSTOSCOPY/URETEROSCOPY/HOLMIUM LASER/STENT PLACEMENT Left 05/24/2017   Procedure: CYSTOSCOPY/URETEROSCOPY/HOLMIUM LASER/STENT PLACEMENT;  Surgeon: Vanna Scotland, MD;  Location: [facility] ORS;  Service: Urology;  Laterality: Left;    Home Medications:    (Not in a hospital admission) Allergies:  Allergies  Allergen Reactions  . Codeine Nausea Only    Family History  Problem Relation Age of Onset  . Bladder Cancer Neg Hx   . Kidney cancer Neg Hx    Social History:  reports that she quit smoking about 20 years ago. Her smoking use included cigarettes. She has a 22.50 pack-year smoking history. She has never used smokeless tobacco. She reports that she drank alcohol. She reports that she does not use drugs.  ROS: A complete review of systems was performed.  All systems are negative except for pertinent findings as noted. ROS   Physical Exam:  Vital signs in last 24 hours: Temp:  [98.6 F (37 C)-98.8 F (37.1 C)] 98.6 F (37 C) (10/24 1601) Pulse Rate:  [107] 107 (10/24 1318) Resp:  [18] 18 (10/24 1318) BP: (127)/(76) 127/76 (10/24 1318) SpO2:  [94 %] 94 % (10/24 1318) Weight:  [86.2 kg] 86.2 kg (10/24 1322) General:  Alert and oriented, No acute distress HEENT: Normocephalic, atraumatic Neck: No JVD or lymphadenopathy Cardiovascular: Regular rate and rhythm Lungs: Regular rate and effort Abdomen: Soft, nontender, nondistended, no abdominal masses Back: No CVA tenderness Extremities: No edema Neurologic: Grossly intact  Laboratory Data:  Results for orders placed or performed during the hospital encounter of 11/02/17 (from the past 24 hour(s))  Comprehensive metabolic panel     Status: Abnormal   Collection Time: 11/02/17  2:08 PM  Result Value Ref Range   Sodium 134 (L) 135 - 145 mmol/L   Potassium 3.6 3.5 - 5.1 mmol/L   Chloride 102 98 - 111 mmol/L   CO2 21 (L)  22 - 32 mmol/L   Glucose, Bld 350 (H) 70 - 99 mg/dL   BUN 16 8 - 23 mg/dL   Creatinine, Ser 7.82 (H) 0.44 - 1.00 mg/dL   Calcium 9.9 8.9 - 95.6 mg/dL   Total Protein 7.4 6.5 - 8.1 g/dL   Albumin 4.0 3.5 - 5.0 g/dL   AST 67 (H) 15 - 41 U/L   ALT 49 (H) 0 - 44 U/L   Alkaline Phosphatase 98 38 - 126 U/L   Total Bilirubin 1.5 (H) 0.3 - 1.2 mg/dL   GFR  calc non Af Amer 42 (L) >60 mL/min   GFR calc Af Amer 48 (L) >60 mL/min   Anion gap 11 5 - 15  CBC with Differential     Status: Abnormal   Collection Time: 11/02/17  2:08 PM  Result Value Ref Range   WBC 11.4 (H) 4.0 - 10.5 K/uL   RBC 4.63 3.87 - 5.11 MIL/uL   Hemoglobin 13.1 12.0 - 15.0 g/dL   HCT 21.3 08.6 - 57.8 %   MCV 84.0 80.0 - 100.0 fL   MCH 28.3 26.0 - 34.0 pg   MCHC 33.7 30.0 - 36.0 g/dL   RDW 46.9 62.9 - 52.8 %   Platelets 125 (L) 150 - 400 K/uL   nRBC 0.0 0.0 - 0.2 %   Neutrophils Relative % 86 %   Neutro Abs 9.9 (H) 1.7 - 7.7 K/uL   Lymphocytes Relative 6 %   Lymphs Abs 0.6 (L) 0.7 - 4.0 K/uL   Monocytes Relative 7 %   Monocytes Absolute 0.8 0.1 - 1.0 K/uL   Eosinophils Relative 0 %   Eosinophils Absolute 0.0 0.0 - 0.5 K/uL   Basophils Relative 0 %   Basophils Absolute 0.0 0.0 - 0.1 K/uL   Immature Granulocytes 1 %   Abs Immature Granulocytes 0.12 (H) 0.00 - 0.07 K/uL  Troponin I     Status: Abnormal   Collection Time: 11/02/17  2:08 PM  Result Value Ref Range   Troponin I 0.09 (HH) <0.03 ng/mL  Lipase, blood     Status: None   Collection Time: 11/02/17  2:08 PM  Result Value Ref Range   Lipase 25 11 - 51 U/L  Urinalysis, Complete w Microscopic     Status: Abnormal   Collection Time: 11/02/17  2:08 PM  Result Value Ref Range   Color, Urine YELLOW (A) YELLOW   APPearance CLOUDY (A) CLEAR   Specific Gravity, Urine 1.016 1.005 - 1.030   pH 5.0 5.0 - 8.0   Glucose, UA >=500 (A) NEGATIVE mg/dL   Hgb urine dipstick MODERATE (A) NEGATIVE   Bilirubin Urine NEGATIVE NEGATIVE   Ketones, ur 5 (A) NEGATIVE mg/dL   Protein, ur 30 (A) NEGATIVE mg/dL   Nitrite POSITIVE (A) NEGATIVE   Leukocytes, UA LARGE (A) NEGATIVE   RBC / HPF 6-10 0 - 5 RBC/hpf   WBC, UA >50 (H) 0 - 5 WBC/hpf   Bacteria, UA MANY (A) NONE SEEN   Squamous Epithelial / LPF 0-5 0 - 5   WBC Clumps PRESENT    Mucus PRESENT    No results found for this or any previous visit (from the past 240  hour(s)). Creatinine: Recent Labs    11/02/17 1408  CREATININE 1.28*   CT scan personally reviewed and is discussed in the history of present illness.  Impression/Assessment:  Right ureteral calculus Hydronephrosis secondary to obstructing calculus Right flank pain Urinary tract infection  Plan:  Proceed urgently to the operating room for cystoscopy with right retrograde pyelogram, right ureteral stent placement.  Continue antibiotics.  Follow-up urine culture.  Ray Church, III 11/02/2017, 4:29 PM

## 2017-11-02 NOTE — ED Notes (Signed)
To Or.

## 2017-11-02 NOTE — ED Notes (Signed)
valubles   Sent with patient   To  Or

## 2017-11-02 NOTE — Anesthesia Procedure Notes (Signed)
Procedure Name: Intubation Performed by: Aaleyah Witherow, CRNA Pre-anesthesia Checklist: Patient identified, Patient being monitored, Timeout performed, Emergency Drugs available and Suction available Patient Re-evaluated:Patient Re-evaluated prior to induction Oxygen Delivery Method: Circle system utilized Preoxygenation: Pre-oxygenation with 100% oxygen Induction Type: IV induction Ventilation: Mask ventilation without difficulty Laryngoscope Size: Mac and 3 Grade View: Grade I Tube type: Oral Tube size: 7.0 mm Number of attempts: 1 Airway Equipment and Method: Stylet Placement Confirmation: ETT inserted through vocal cords under direct vision,  positive ETCO2 and breath sounds checked- equal and bilateral Secured at: 21 cm Tube secured with: Tape Dental Injury: Teeth and Oropharynx as per pre-operative assessment        

## 2017-11-02 NOTE — Anesthesia Preprocedure Evaluation (Signed)
Anesthesia Evaluation  Patient identified by MRN, date of birth, ID band Patient awake    Reviewed: Allergy & Precautions, H&P , NPO status , Patient's Chart, lab work & pertinent test results  History of Anesthesia Complications Negative for: history of anesthetic complications  Airway Mallampati: III  TM Distance: <3 FB Neck ROM: limited    Dental  (+) Poor Dentition, Missing, Upper Dentures, Lower Dentures, Dental Advidsory Given   Pulmonary neg shortness of breath, former smoker,           Cardiovascular Exercise Tolerance: Good hypertension,      Neuro/Psych PSYCHIATRIC DISORDERS Anxiety negative neurological ROS     GI/Hepatic Neg liver ROS, GERD  Medicated and Controlled,  Endo/Other  diabetes, Type 2  Renal/GU Renal disease (kidney stones)     Musculoskeletal   Abdominal   Peds  Hematology negative hematology ROS (+)   Anesthesia Other Findings Past Medical History: No date: Anxiety No date: Diabetes (HCC) No date: GERD (gastroesophageal reflux disease) No date: High blood pressure No date: High cholesterol  Past Surgical History: No date: ABDOMINAL HYSTERECTOMY No date: CHOLECYSTECTOMY  BMI    Body Mass Index:  37.41 kg/m      Reproductive/Obstetrics negative OB ROS                             Anesthesia Physical  Anesthesia Plan  ASA: III  Anesthesia Plan: General ETT   Post-op Pain Management:    Induction: Intravenous  PONV Risk Score and Plan: Ondansetron, Dexamethasone and Treatment may vary due to age or medical condition  Airway Management Planned: Oral ETT  Additional Equipment:   Intra-op Plan:   Post-operative Plan: Extubation in OR  Informed Consent: I have reviewed the patients History and Physical, chart, labs and discussed the procedure including the risks, benefits and alternatives for the proposed anesthesia with the patient or  authorized representative who has indicated his/her understanding and acceptance.   Dental Advisory Given  Plan Discussed with: Anesthesiologist, CRNA and Surgeon  Anesthesia Plan Comments: (Patient consented for risks of anesthesia including but not limited to:  - adverse reactions to medications - damage to teeth, lips or other oral mucosa - sore throat or hoarseness - Damage to heart, brain, lungs or loss of life  Patient voiced understanding.)        Anesthesia Quick Evaluation

## 2017-11-02 NOTE — ED Notes (Signed)
Spoke with Kristina Archer in lab will add  Urine  Culture  To labs

## 2017-11-02 NOTE — Anesthesia Post-op Follow-up Note (Signed)
Anesthesia QCDR form completed.        

## 2017-11-02 NOTE — ED Notes (Signed)
1433  Dr Alphonzo Lemmings notified of elevated troponin

## 2017-11-02 NOTE — Op Note (Signed)
Operative Note  Preoperative diagnosis:  1.  Right ureteral calculus with urinary tract infection  Post operative diagnosis: 1.  Right ureteral calculus with urinary tract infection  Procedure(s): 1.  Cystoscopy with right retrograde pyelogram and right ureteral stent placement  Surgeon: Modena Slater, MD  Assistants: None  Anesthesia: General  Complications: None immediate  EBL: Minimal  Specimens: 1.  Urine culture  Drains/Catheters: 1.  6 X 24 double-J ureteral stent 2.  Foley catheter  Intraoperative findings: 1.  Normal urethra and bladder 2.  Right retrograde pyelogram revealed a filling defect at the level of the stone with upstream hydroureteronephrosis. 3.  Pus returned and drained after stent placement  Indication: 72 year old female with a history of diabetes and nephrolithiasis as well as urinary tract infections presented with right-sided flank pain, tachycardia, elevated troponin thought to be secondary to the acute insult from the stone and infection and therefore presents for the previously mentioned operation urgently.  Description of procedure:  The patient was identified and consent was obtained.  The patient was taken to the operating room and placed in the supine position.  The patient was placed under general anesthesia.  Perioperative antibiotics were administered.  The patient was placed in dorsal lithotomy.  Patient was prepped and draped in a standard sterile fashion and a timeout was performed.  A 21 French rigid cystoscope was advanced into the urethra and into the bladder.  The right distal most portion of the ureter was cannulated with an open-ended ureteral catheter.  Retrograde pyelogram was performed with the findings noted above.  A sensor wire was then advanced up to the kidney under fluoroscopic guidance.  A 6 X 24 double-J ureteral stent was advanced up to the kidney under fluoroscopic guidance.  The wire was withdrawn and fluoroscopy confirmed  good proximal placement and direct visualization confirmed a good coil within the bladder.  Pus effluxed from the ureter after stent placement and this was collected for specimen.  I then placed a urethral catheter..  This concluded the operation.  Patient tolerated procedure well and was stable postoperatively.  Plan: Admit to the hospitalist service for IV antibiotics.  Follow-up on urine culture.  She will need definitive management of the stone down the road.

## 2017-11-02 NOTE — H&P (Signed)
[facility] [place] [facility]   PATIENT NAME: Kristina Archer    MR#:  [id number]  DATE OF BIRTH:  May 25, 1945  DATE OF ADMISSION:  11/02/2017  PRIMARY CARE PHYSICIAN: Corky Downs, MD   REQUESTING/REFERRING PHYSICIAN: Dr. Alphonzo Lemmings  CHIEF COMPLAINT: Right flank pain   Chief Complaint  Patient presents with  . Back Pain    HISTORY OF PRESENT ILLNESS:  Kristina Archer  is a 72 y.o. female with a known history of essential hypertension, diabetes mellitus type 2, comes in with right flank pain started this morning.  Patient had severe right flank pain associated with nausea.  Found to have noted right hydronephrosis with 10x 13 mm stone at right UPJ.   No Chest pain or shortness of breath.  PAST MEDICAL HISTORY:   Past Medical History:  Diagnosis Date  . Anxiety   . Diabetes (HCC)   . GERD (gastroesophageal reflux disease)   . High blood pressure   . High cholesterol     PAST SURGICAL HISTOIRY:   Past Surgical History:  Procedure Laterality Date  . ABDOMINAL HYSTERECTOMY    . CHOLECYSTECTOMY    . CYSTOSCOPY W/ RETROGRADES Bilateral 05/24/2017   Procedure: CYSTOSCOPY WITH RETROGRADE PYELOGRAM;  Surgeon: Vanna Scotland, MD;  Location: [facility] ORS;  Service: Urology;  Laterality: Bilateral;  . CYSTOSCOPY/URETEROSCOPY/HOLMIUM LASER/STENT PLACEMENT Left 05/24/2017   Procedure: CYSTOSCOPY/URETEROSCOPY/HOLMIUM LASER/STENT PLACEMENT;  Surgeon: Vanna Scotland, MD;  Location: [facility] ORS;  Service: Urology;  Laterality: Left;    SOCIAL HISTORY:   Social History   Tobacco Use  . Smoking status: Former Smoker    Packs/day: 1.50    Years: 15.00    Pack years: 22.50    Types: Cigarettes    Last attempt to quit: 05/12/1997    Years since quitting: 20.4  . Smokeless tobacco: Never Used  Substance Use Topics  . Alcohol use: Not Currently    FAMILY HISTORY:   Family History  Problem Relation Age of Onset  . Bladder Cancer Neg Hx   . Kidney cancer Neg Hx      DRUG ALLERGIES:   Allergies  Allergen Reactions  . Codeine Nausea Only    REVIEW OF SYSTEMS:  CONSTITUTIONAL: No fever,f has nausea.  EYES: No blurred or double vision.  EARS, NOSE, AND THROAT: No tinnitus or ear pain.  RESPIRATORY: No cough, shortness of breath, wheezing or hemoptysis.  CARDIOVASCULAR: No chest pain, orthopnea, edema.  GASTROINTESTINAL: Nausea, right flank pain gENITOURINARY: No dysuria, hematuria.  ENDOCRINE: No polyuria, nocturia,  HEMATOLOGY: No anemia, easy bruising or bleeding SKIN: No rash or lesion. MUSCULOSKELETAL: No joint pain or arthritis.   NEUROLOGIC: No tingling, numbness, weakness.  PSYCHIATRY: No anxiety or depression.   MEDICATIONS AT HOME:   Prior to Admission medications   Medication Sig Start Date End Date Taking? Authorizing Provider  amLODipine-benazepril (LOTREL) 5-10 MG capsule Take 1 capsule by mouth daily.   Yes [provider]  dexlansoprazole (DEXILANT) 60 MG capsule Take 60 mg by mouth daily before breakfast.   Yes [provider]  docusate sodium (COLACE) 100 MG capsule Take 1 capsule (100 mg total) by mouth 2 (two) times daily. 05/24/17  Yes Vanna Scotland, MD  furosemide (LASIX) 20 MG tablet Take 20 mg by mouth daily.   Yes [provider]  gabapentin (NEURONTIN) 100 MG capsule Take 100 mg by mouth 3 (three) times daily as needed (back pain).   Yes [provider]  glimepiride (AMARYL) 2 MG tablet  Take 2 mg by mouth daily with breakfast.   Yes [provider]  metFORMIN (GLUCOPHAGE-XR) 500 MG 24 hr tablet Take 500 mg by mouth 2 (two) times daily.   Yes [provider]  Multiple Vitamin (MULTIVITAMIN WITH MINERALS) TABS tablet Take 1 tablet by mouth daily.   Yes [provider]  PARoxetine (PAXIL) 20 MG tablet Take 20 mg by mouth at bedtime.   Yes [provider]  rosuvastatin (CRESTOR) 5 MG tablet Take 5 mg by mouth daily.  06/25/14  Yes [provider]  sitaGLIPtin (JANUVIA) 100 MG tablet Take 100 mg by mouth daily.   Yes [provider]  HYDROcodone-acetaminophen (NORCO/VICODIN) 5-325 MG tablet Take 1-2 tablets by mouth every 6 (six) hours as needed for moderate pain. Patient not taking: Reported on 11/02/2017 05/24/17   Vanna Scotland, MD  nitrofurantoin, macrocrystal-monohydrate, (MACROBID) 100 MG capsule Take 1 capsule (100 mg total) by mouth every 12 (twelve) hours. Patient not taking: Reported on 11/02/2017 06/12/17   Vanna Scotland, MD  oxybutynin (DITROPAN) 5 MG tablet Take 1 tablet (5 mg total) by mouth every 8 (eight) hours as needed for bladder spasms. Patient not taking: Reported on 11/02/2017 05/24/17   Vanna Scotland, MD  sulfamethoxazole-trimethoprim (BACTRIM DS,SEPTRA DS) 800-160 MG tablet Take 1 tablet by mouth every 12 (twelve) hours. Patient not taking: Reported on 11/02/2017 06/09/17   Vanna Scotland, MD  tamsulosin (FLOMAX) 0.4 MG CAPS capsule Take 1 capsule (0.4 mg total) by mouth daily. Patient not taking: Reported on 11/02/2017 05/24/17   Vanna Scotland, MD      VITAL SIGNS:  Blood pressure 127/76, pulse (!) 107, temperature 98.8 F (37.1 C), temperature source Oral, resp. rate 18, height 5\' 3"  (1.6 m), weight 86.2 kg, SpO2 94 %.  PHYSICAL EXAMINATION:  GENERAL:  72 y.o.-year-old patient lying in the bed with no acute distress.  EYES: Pupils equal, round, reactive to light and accommodation. No scleral icterus. Extraocular muscles intact.  HEENT: Head atraumatic, normocephalic. Oropharynx and nasopharynx clear.  NECK:  Supple, no jugular venous distention. No thyroid enlargement, no tenderness.  LUNGS: Normal breath sounds bilaterally, no wheezing, rales,rhonchi or crepitation. No use of accessory muscles of respiration.  CARDIOVASCULAR: S1, S2 normal. No murmurs, rubs, or gallops.  ABDOMEN: Right CVA tenderness present, no organomegaly, bowel sounds diminished.  EXTREMITIES: No pedal edema, cyanosis, or  clubbing.  NEUROLOGIC: Cranial nerves II through XII are intact. Muscle strength 5/5 in all extremities. Sensation intact. Gait not checked.  PSYCHIATRIC: The patient is alert and oriented x 3.  SKIN: No obvious rash, lesion, or ulcer.   LABORATORY PANEL:   CBC Recent Labs  Lab 11/02/17 1408  WBC 11.4*  HGB 13.1  HCT 38.9  PLT 125*   ------------------------------------------------------------------------------------------------------------------  Chemistries  Recent Labs  Lab 11/02/17 1408  NA 134*  K 3.6  CL 102  CO2 21*  GLUCOSE 350*  BUN 16  CREATININE 1.28*  CALCIUM 9.9  AST 67*  ALT 49*  ALKPHOS 98  BILITOT 1.5*   ------------------------------------------------------------------------------------------------------------------  Cardiac Enzymes Recent Labs  Lab 11/02/17 1408  TROPONINI 0.09*   ------------------------------------------------------------------------------------------------------------------  RADIOLOGY:  Ct Renal Stone Study  Result Date: 11/02/2017 CLINICAL DATA:  Flank pain EXAM: CT ABDOMEN AND PELVIS WITHOUT CONTRAST TECHNIQUE: Multidetector CT imaging of the abdomen and pelvis was performed following the standard protocol without IV contrast. COMPARISON:  Ultrasound 07/21/2017, radiograph 05/22/2017 FINDINGS: Lower chest: Lung bases demonstrate no acute consolidation or effusion. The heart size is normal.  Hepatobiliary: No focal liver abnormality is seen. There may be subtle contour nodularity of the surface of the liver. Status post cholecystectomy. No biliary dilatation. Pancreas: Unremarkable. No pancreatic ductal dilatation or surrounding inflammatory changes. Spleen: Normal in size without focal abnormality. Adrenals/Urinary Tract: Adrenal glands are normal. Moderate right perinephric stranding. Moderate right hydronephrosis, secondary to a 10 x 13 mm stone in the proximal right ureter at the UPJ. Additional 8 mm stone lower pole right  kidney. 3 mm stone lower pole left kidney. Bladder unremarkable Stomach/Bowel: Stomach is within normal limits. Appendix appears normal. No evidence of bowel wall thickening, distention, or inflammatory changes. Left colon diverticular disease without acute inflammatory process Vascular/Lymphatic: Moderate aortic atherosclerosis. No aneurysm. No significantly enlarged lymph nodes. Reproductive: Status post hysterectomy.  3.1 cm right ovarian cyst. Other: Negative for free air or free fluid. Musculoskeletal: Mild scoliosis and degenerative change of the spine IMPRESSION: 1. Moderate right hydronephrosis, secondary to a 10 x 13 mm stone at the right UPJ. 2. Intrarenal calculi bilaterally 3. Left colon diverticular disease without acute inflammation 4. 3.1 cm right adnexal cyst. Recommend correlation with pelvic ultrasound which may be performed on a nonemergent basis. Electronically Signed   By: Jasmine Pang M.D.   On: 11/02/2017 14:34    EKG:   Orders placed or performed during the hospital encounter of 11/02/17  . ED EKG  . ED EKG  . EKG 12-Lead  . EKG 12-Lead  . EKG 12-Lead  . EKG 12-Lead  EKG s tachycardia with 100 bpm, left bundle branch block, EKG changes are not new compared to EKG done in May.  She has history of left bundle branch block before. IMPRESSION AND PLAN:   72 year old female patient with essential hypertension, diabetes mellitus type 2, history of previous left kidney stones with sudden onset of right flank pain found to have right-sided hydronephrosis with right UPJ stone.  #1. acute right-sided hydronephrosis secondary to large stone at right UPJ 10x13; admitted to medical unit, spoke with Dr. Modena Slater from urology, patient tentatively scheduled for right ureteral stent, nephrostomy tube,npo, continue IV fluids, empiric antibiotics, 2.  Slightly elevated troponins without EKG change likely due to demand ischemia from #1, patient has no chest pain, no EKg changes compared old  EKG,medically clear with moderate risk for for surgery.  Her advanced age, comorbidities.  Cycle troponins, order echocardiogram She denies cp or sob.  3/diabetes mellitus type 2: Hold oral hypoglycemic agents today as she will go to OR.  spoke  with urology over the phone.   All the records are reviewed and case discussed with ED provider. Management plans discussed with the patient, family and they are in agreement.  CODE STATUS: full TOTAL TIME TAKING CARE OF THIS PATIENT:55 minutes.    Katha Hamming M.D on 11/02/2017 at 3:29 PM  Between 7am to 6pm - Pager - [phone]  After 6pm go to [email or web address] - password [email or web address] [facility]  [facility]  Office  [phone]  CC: Primary care physician; Corky Downs, MD  Note: This dictation was prepared with Dragon dictation along with smaller phrase technology. Any transcriptional errors that result from this process are unintentional.

## 2017-11-02 NOTE — Transfer of Care (Signed)
Immediate Anesthesia Transfer of Care Note  Patient: Kristina Archer  Procedure(s) Performed: CYSTOSCOPY WITH RETROGRADE PYELOGRAM/URETERAL STENT PLACEMENT (Right )  Patient Location: PACU  Anesthesia Type:General  Level of Consciousness: awake and alert   Airway & Oxygen Therapy: Patient connected to face mask oxygen  Post-op Assessment: Post -op Vital signs reviewed and stable  Post vital signs: stable  Last Vitals:  Vitals Value Taken Time  BP 141/68 11/02/2017  5:21 PM  Temp    Pulse 108 11/02/2017  5:24 PM  Resp 28 11/02/2017  5:24 PM  SpO2 90 % 11/02/2017  5:24 PM  Vitals shown include unvalidated device data.  Last Pain:  Vitals:   11/02/17 1601  TempSrc: Oral  PainSc:          Complications: No apparent anesthesia complications

## 2017-11-02 NOTE — ED Triage Notes (Signed)
Pt  Arrived  Via ems  With r sided  Back  Pain   Started late  Last  nigt / early this am    Pt reports  Some nausea  /  Vomiting   This  Am   Glucose  Was  329  By  Ems   Pt  Did  Not take  Her meds   She is awake and alert

## 2017-11-03 ENCOUNTER — Encounter: Payer: Self-pay | Admitting: Urology

## 2017-11-03 ENCOUNTER — Inpatient Hospital Stay (HOSPITAL_COMMUNITY)
Admit: 2017-11-03 | Discharge: 2017-11-03 | Disposition: A | Discharge status: Home / Self Care | Payer: Medicare HMO | Attending: Internal Medicine | Admitting: Internal Medicine

## 2017-11-03 ENCOUNTER — Inpatient Hospital Stay: Payer: Medicare HMO

## 2017-11-03 DIAGNOSIS — I739 Peripheral vascular disease, unspecified: Secondary | ICD-10-CM

## 2017-11-03 DIAGNOSIS — J432 Centrilobular emphysema: Secondary | ICD-10-CM

## 2017-11-03 DIAGNOSIS — I447 Left bundle-branch block, unspecified: Secondary | ICD-10-CM

## 2017-11-03 DIAGNOSIS — I472 Ventricular tachycardia: Secondary | ICD-10-CM

## 2017-11-03 DIAGNOSIS — I340 Nonrheumatic mitral (valve) insufficiency: Secondary | ICD-10-CM

## 2017-11-03 DIAGNOSIS — I420 Dilated cardiomyopathy: Secondary | ICD-10-CM

## 2017-11-03 DIAGNOSIS — N201 Calculus of ureter: Secondary | ICD-10-CM

## 2017-11-03 DIAGNOSIS — R0603 Acute respiratory distress: Secondary | ICD-10-CM

## 2017-11-03 DIAGNOSIS — N200 Calculus of kidney: Secondary | ICD-10-CM

## 2017-11-03 DIAGNOSIS — N10 Acute tubulo-interstitial nephritis: Secondary | ICD-10-CM

## 2017-11-03 LAB — GLUCOSE, CAPILLARY
GLUCOSE-CAPILLARY: 286 mg/dL — AB (ref 70–99)
GLUCOSE-CAPILLARY: 308 mg/dL — AB (ref 70–99)
GLUCOSE-CAPILLARY: 261 mg/dL — AB (ref 70–99)
Glucose-Capillary: 231 mg/dL — ABNORMAL HIGH (ref 70–99)
Glucose-Capillary: 429 mg/dL — ABNORMAL HIGH (ref 70–99)
Glucose-Capillary: 366 mg/dL — ABNORMAL HIGH (ref 70–99)

## 2017-11-03 LAB — CBC
HEMATOCRIT: 35.1 % — AB (ref 36.0–46.0)
HEMATOCRIT: 35.3 % — AB (ref 36.0–46.0)
Hemoglobin: 11.6 g/dL — ABNORMAL LOW (ref 12.0–15.0)
Hemoglobin: 11.8 g/dL — ABNORMAL LOW (ref 12.0–15.0)
MCH: 28.1 pg (ref 26.0–34.0)
MCH: 28 pg (ref 26.0–34.0)
MCHC: 33 g/dL (ref 30.0–36.0)
MCHC: 33.4 g/dL (ref 30.0–36.0)
MCV: 85 fL (ref 80.0–100.0)
MCV: 83.8 fL (ref 80.0–100.0)
NRBC: 0 % (ref 0.0–0.2)
Platelets: 103 10*3/uL — ABNORMAL LOW (ref 150–400)
Platelets: 88 10*3/uL — ABNORMAL LOW (ref 150–400)
RBC: 4.13 MIL/uL (ref 3.87–5.11)
RBC: 4.21 MIL/uL (ref 3.87–5.11)
RDW: 14.2 % (ref 11.5–15.5)
RDW: 14.2 % (ref 11.5–15.5)
WBC: 13.2 10*3/uL — ABNORMAL HIGH (ref 4.0–10.5)
WBC: 9.6 10*3/uL (ref 4.0–10.5)
nRBC: 0 % (ref 0.0–0.2)

## 2017-11-03 LAB — BASIC METABOLIC PANEL
ANION GAP: 11 (ref 5–15)
Anion gap: 4 — ABNORMAL LOW (ref 5–15)
BUN: 22 mg/dL (ref 8–23)
BUN: 20 mg/dL (ref 8–23)
CHLORIDE: 105 mmol/L (ref 98–111)
CHLORIDE: 103 mmol/L (ref 98–111)
CO2: 26 mmol/L (ref 22–32)
CO2: 20 mmol/L — AB (ref 22–32)
CREATININE: 1.33 mg/dL — AB (ref 0.44–1.00)
Calcium: 9 mg/dL (ref 8.9–10.3)
Calcium: 9.1 mg/dL (ref 8.9–10.3)
Creatinine, Ser: 1.37 mg/dL — ABNORMAL HIGH (ref 0.44–1.00)
GFR calc non Af Amer: 40 mL/min — ABNORMAL LOW (ref >60)
GFR calc non Af Amer: 38 mL/min — ABNORMAL LOW (ref >60)
GFR, EST AFRICAN AMERICAN: 46 mL/min — AB (ref >60)
GFR, EST AFRICAN AMERICAN: 44 mL/min — AB (ref >60)
GLUCOSE: 278 mg/dL — AB (ref 70–99)
GLUCOSE: 362 mg/dL — AB (ref 70–99)
POTASSIUM: 3.3 mmol/L — AB (ref 3.5–5.1)
Potassium: 3.9 mmol/L (ref 3.5–5.1)
Sodium: 135 mmol/L (ref 135–145)
Sodium: 134 mmol/L — ABNORMAL LOW (ref 135–145)

## 2017-11-03 LAB — ECHOCARDIOGRAM COMPLETE
Height: 63 in
WEIGHTICAEL: 3209.6 [oz_av]

## 2017-11-03 LAB — TROPONIN I
TROPONIN I: 0.71 ng/mL — AB (ref <0.03)
Troponin I: 0.27 ng/mL (ref <0.03)

## 2017-11-03 LAB — BRAIN NATRIURETIC PEPTIDE: B NATRIURETIC PEPTIDE 5: 862 pg/mL — AB (ref 0.0–100.0)

## 2017-11-03 LAB — MAGNESIUM: Magnesium: 1.7 mg/dL (ref 1.7–2.4)

## 2017-11-03 MED ORDER — POTASSIUM CHLORIDE CRYS ER 20 MEQ PO TBCR
40.0000 meq | EXTENDED_RELEASE_TABLET | Freq: Once | ORAL | Status: AC
  Administered 2017-11-03: 40 meq via ORAL
  Filled 2017-11-03: qty 2

## 2017-11-03 MED ORDER — LINAGLIPTIN 5 MG PO TABS
5.0000 mg | ORAL_TABLET | Freq: Every day | ORAL | Status: DC
  Administered 2017-11-03 – 2017-11-13 (×11): 5 mg via ORAL
  Filled 2017-11-03 (×12): qty 1

## 2017-11-03 MED ORDER — LEVALBUTEROL HCL 1.25 MG/0.5ML IN NEBU
1.2500 mg | INHALATION_SOLUTION | RESPIRATORY_TRACT | Status: DC | PRN
  Administered 2017-11-07: 1.25 mg via RESPIRATORY_TRACT
  Filled 2017-11-03 (×2): qty 0.5

## 2017-11-03 MED ORDER — HALOPERIDOL LACTATE 5 MG/ML IJ SOLN: 5.0000 mg | Freq: Once | INTRAMUSCULAR | Status: DC

## 2017-11-03 MED ORDER — INSULIN ASPART 100 UNIT/ML ~~LOC~~ SOLN
12.0000 [IU] | Freq: Once | SUBCUTANEOUS | Status: AC
  Administered 2017-11-03: 12 [IU] via SUBCUTANEOUS
  Filled 2017-11-03: qty 1

## 2017-11-03 MED ORDER — LORAZEPAM 2 MG/ML IJ SOLN
INTRAMUSCULAR | Status: AC
  Filled 2017-11-03: qty 1

## 2017-11-03 MED ORDER — METOPROLOL TARTRATE 5 MG/5ML IV SOLN: 5.0000 mg | INTRAVENOUS | Status: DC | PRN

## 2017-11-03 MED ORDER — METOPROLOL TARTRATE 5 MG/5ML IV SOLN
INTRAVENOUS | Status: AC
  Administered 2017-11-03: 5 mg
  Filled 2017-11-03: qty 5

## 2017-11-03 MED ORDER — ADENOSINE 6 MG/2ML IV SOLN
12.0000 mg | Freq: Once | INTRAVENOUS | Status: DC
  Filled 2017-11-03 (×3): qty 4

## 2017-11-03 MED ORDER — CARVEDILOL 3.125 MG PO TABS
3.1250 mg | ORAL_TABLET | Freq: Two times a day (BID) | ORAL | Status: DC
  Administered 2017-11-03 – 2017-11-13 (×19): 3.125 mg via ORAL
  Filled 2017-11-03 (×19): qty 1

## 2017-11-03 MED ORDER — IPRATROPIUM-ALBUTEROL 0.5-2.5 (3) MG/3ML IN SOLN
3.0000 mL | RESPIRATORY_TRACT | Status: DC
  Filled 2017-11-03: qty 3

## 2017-11-03 MED ORDER — FUROSEMIDE 10 MG/ML IJ SOLN
40.0000 mg | Freq: Every day | INTRAMUSCULAR | Status: DC
  Administered 2017-11-03 – 2017-11-04 (×2): 40 mg via INTRAVENOUS
  Filled 2017-11-03 (×2): qty 4

## 2017-11-03 MED ORDER — FUROSEMIDE 10 MG/ML IJ SOLN
20.0000 mg | Freq: Once | INTRAMUSCULAR | Status: AC
  Administered 2017-11-03: 20 mg via INTRAVENOUS
  Filled 2017-11-03: qty 4

## 2017-11-03 MED ORDER — ADENOSINE 6 MG/2ML IV SOLN
12.0000 mg | Freq: Once | INTRAVENOUS | Status: AC
  Administered 2017-11-03: 6 mg via INTRAVENOUS
  Filled 2017-11-03: qty 4

## 2017-11-03 MED ORDER — GLIMEPIRIDE 2 MG PO TABS
2.0000 mg | ORAL_TABLET | Freq: Every day | ORAL | Status: DC
  Administered 2017-11-04: 2 mg via ORAL
  Filled 2017-11-03: qty 1

## 2017-11-03 MED ORDER — IPRATROPIUM-ALBUTEROL 0.5-2.5 (3) MG/3ML IN SOLN
3.0000 mL | RESPIRATORY_TRACT | Status: DC | PRN
  Administered 2017-11-03: 3 mL via RESPIRATORY_TRACT
  Filled 2017-11-03: qty 3

## 2017-11-03 MED ORDER — POTASSIUM CHLORIDE CRYS ER 20 MEQ PO TBCR
20.0000 meq | EXTENDED_RELEASE_TABLET | Freq: Every day | ORAL | Status: DC
  Administered 2017-11-04 – 2017-11-08 (×5): 20 meq via ORAL
  Filled 2017-11-03 (×5): qty 1

## 2017-11-03 MED ORDER — ROSUVASTATIN CALCIUM 10 MG PO TABS
40.0000 mg | ORAL_TABLET | Freq: Every day | ORAL | Status: DC
  Administered 2017-11-04 – 2017-11-13 (×10): 40 mg via ORAL
  Filled 2017-11-03 (×10): qty 4

## 2017-11-03 MED ORDER — ASPIRIN 81 MG PO CHEW
81.0000 mg | CHEWABLE_TABLET | Freq: Every day | ORAL | Status: DC
  Administered 2017-11-03 – 2017-11-09 (×7): 81 mg via ORAL
  Filled 2017-11-03 (×7): qty 1

## 2017-11-03 MED ORDER — CARVEDILOL 3.125 MG PO TABS: 3.1250 mg | ORAL_TABLET | Freq: Two times a day (BID) | ORAL | Status: DC

## 2017-11-03 MED ORDER — METFORMIN HCL ER 500 MG PO TB24
500.0000 mg | ORAL_TABLET | Freq: Two times a day (BID) | ORAL | Status: DC
  Filled 2017-11-03: qty 1

## 2017-11-03 MED ORDER — LORAZEPAM 2 MG/ML IJ SOLN
1.0000 mg | INTRAMUSCULAR | Status: DC | PRN
  Administered 2017-11-03: 1 mg via INTRAVENOUS

## 2017-11-03 NOTE — Progress Notes (Signed)
[facility] [place] at [facility]   PATIENT NAME: Kristina Archer    MR#:  [id number]  DATE OF BIRTH:  [birth year]  SUBJECTIVE: No flank pain, feels better today.  CHIEF COMPLAINT:   Chief Complaint  Patient presents with  . Back Pain  No chest pain or shortness of breath.  Hypotensive this morning, tachycardic.  No fever.  REVIEW OF SYSTEMS:   ROS CONSTITUTIONAL: No fever, fatigue or weakness.  EYES: No blurred or double vision.  EARS, NOSE, AND THROAT: No tinnitus or ear pain.  RESPIRATORY: No cough, shortness of breath, wheezing or hemoptysis.  CARDIOVASCULAR: No chest pain, orthopnea, edema.  GASTROINTESTINAL: No nausea, vomiting, diarrhea or abdominal pain.  GENITOURINARY: No dysuria, hematuria.  ENDOCRINE: No polyuria, nocturia,  HEMATOLOGY: No anemia, easy bruising or bleeding SKIN: No rash or lesion. MUSCULOSKELETAL: No joint pain or arthritis.   NEUROLOGIC: No tingling, numbness, weakness.  PSYCHIATRY: No anxiety or depression.   DRUG ALLERGIES:   Allergies  Allergen Reactions  . Codeine Nausea Only    VITALS:  Blood pressure (!) 96/55, pulse (!) 118, temperature 98.4 F (36.9 C), temperature source Oral, resp. rate (!) 24, height 5\' 3"  (1.6 m), weight 91 kg, SpO2 91 %.  PHYSICAL EXAMINATION:  GENERAL:  72 y.o.-year-old patient lying in the bed with no acute distress.  EYES: Pupils equal, round, reactive to light and accommodation. No scleral icterus. Extraocular muscles intact.  HEENT: Head atraumatic, normocephalic. Oropharynx and nasopharynx clear.  NECK:  Supple, no jugular venous distention. No thyroid enlargement, no tenderness.  LUNGS: Normal breath sounds bilaterally, no wheezing, rales,rhonchi or crepitation. No use of accessory muscles of respiration.  CARDIOVASCULAR: S1, S2 normal. No murmurs, rubs, or gallops.  ABDOMEN: Soft, nontender, nondistended. Bowel sounds present. No organomegaly or mass.  EXTREMITIES: No pedal  edema, cyanosis, or clubbing.  NEUROLOGIC: Cranial nerves II through XII are intact. Muscle strength 5/5 in all extremities. Sensation intact. Gait not checked.  PSYCHIATRIC: The patient is alert and oriented x 3.  SKIN: No obvious rash, lesion, or ulcer.    LABORATORY PANEL:   CBC Recent Labs  Lab 11/03/17 0327  WBC 13.2*  HGB 11.6*  HCT 35.1*  PLT 103*   ------------------------------------------------------------------------------------------------------------------  Chemistries  Recent Labs  Lab 11/02/17 1408 11/03/17 0327  NA 134* 135  K 3.6 3.9  CL 102 105  CO2 21* 26  GLUCOSE 350* 278*  BUN 16 22  CREATININE 1.28* 1.33*  CALCIUM 9.9 9.0  AST 67*  --   ALT 49*  --   ALKPHOS 98  --   BILITOT 1.5*  --    ------------------------------------------------------------------------------------------------------------------  Cardiac Enzymes Recent Labs  Lab 11/03/17 0327  TROPONINI 0.29*   ------------------------------------------------------------------------------------------------------------------  RADIOLOGY:  Ct Renal Stone Study  Result Date: 11/02/2017 CLINICAL DATA:  Flank pain EXAM: CT ABDOMEN AND PELVIS WITHOUT CONTRAST TECHNIQUE: Multidetector CT imaging of the abdomen and pelvis was performed following the standard protocol without IV contrast. COMPARISON:  Ultrasound 07/21/2017, radiograph 05/22/2017 FINDINGS: Lower chest: Lung bases demonstrate no acute consolidation or effusion. The heart size is normal. Hepatobiliary: No focal liver abnormality is seen. There may be subtle contour nodularity of the surface of the liver. Status post cholecystectomy. No biliary dilatation. Pancreas: Unremarkable. No pancreatic ductal dilatation or surrounding inflammatory changes. Spleen: Normal in size without focal abnormality. Adrenals/Urinary Tract: Adrenal glands are normal. Moderate right perinephric stranding. Moderate right hydronephrosis, secondary to a 10 x 13  mm stone in the proximal  right ureter at the UPJ. Additional 8 mm stone lower pole right kidney. 3 mm stone lower pole left kidney. Bladder unremarkable Stomach/Bowel: Stomach is within normal limits. Appendix appears normal. No evidence of bowel wall thickening, distention, or inflammatory changes. Left colon diverticular disease without acute inflammatory process Vascular/Lymphatic: Moderate aortic atherosclerosis. No aneurysm. No significantly enlarged lymph nodes. Reproductive: Status post hysterectomy.  3.1 cm right ovarian cyst. Other: Negative for free air or free fluid. Musculoskeletal: Mild scoliosis and degenerative change of the spine IMPRESSION: 1. Moderate right hydronephrosis, secondary to a 10 x 13 mm stone at the right UPJ. 2. Intrarenal calculi bilaterally 3. Left colon diverticular disease without acute inflammation 4. 3.1 cm right adnexal cyst. Recommend correlation with pelvic ultrasound which may be performed on a nonemergent basis. Electronically Signed   By: Jasmine Pang M.D.   On: 11/02/2017 14:34    EKG:   Orders placed or performed during the hospital encounter of 11/02/17  . ED EKG  . ED EKG  . EKG 12-Lead  . EKG 12-Lead  . EKG 12-Lead  . EKG 12-Lead    ASSESSMENT AND PLAN:   #1 /right ureteral calculus with right-sided hydronephrosis secondary to obstructing calculus: Status post emergency right ureteral stent by urology yesterday patient has been afebrile after the procedure, feels much better after the stent placed Continue IV antibiotics, IV fluids follow urine cultures, appreciate urology following. Non-ST elevation MI' likely due to demand ischemia, patient troponin peaked up to 0.29, cardiology is consulted, echocardiogram done this morning, follow results, continue crestor. beta-blockers because of hypotension, can use aspirin if okay okay with urology. 3 .  Diabetes mellitus type 2: Metformin held on admission because of urological procedure yesterday, resume  metformin, glyburide.,  Januvia blood sugar more than 250. #4 anxiety, depression: Patient is on Paxil.  Appreciate urology, cardiology following today. All the records are reviewed and case discussed with Care Management/Social Workerr. Management plans discussed with the patient, family and they are in agreement.  CODE STATUS: full TOTAL TIME TAKING CARE OF THIS PATIENT: 40 minutes.  More than 50% time spent in counseling, coordination, reviewing the home medicines POSSIBLE D/C IN 1-2 DAYS, DEPENDING ON CLINICAL CONDITION.   Katha Hamming M.D on 11/03/2017 at 11:52 AM  Between 7am to 6pm - Pager - [phone]  After 6pm go to [email or web address] - password [email or web address] [facility]  [facility]  Office  [phone]  CC: Primary care physician; Corky Downs, MD   Note: This dictation was prepared with Dragon dictation along with smaller phrase technology. Any transcriptional errors that result from this process are unintentional.

## 2017-11-03 NOTE — Progress Notes (Signed)
Per MD okay for RN to request a bed for ICU per Dr. Mariah Milling she needs to be monitor more closely pt EF is  Less than 20 percent.

## 2017-11-03 NOTE — Progress Notes (Signed)
Pt accepted in transfer from 2-c. Post episode of svt and administration of adenosine. 112/64 104 28 sat 85 pm 5l. Tried a non rebriether, ;but pt pulled it off. Dr. Cherlynn Kaiser notified. Order for ativan given. Spoke with pt's son who said he would come sit with her. By changed of shift pt restless and anxious. Order for haldol given. Sitter ordered.

## 2017-11-03 NOTE — Plan of Care (Signed)

## 2017-11-03 NOTE — Progress Notes (Signed)
*  PRELIMINARY RESULTS* Echocardiogram 2D Echocardiogram has been performed.  Joanette Gula Brion Sossamon 11/03/2017, 9:43 AM

## 2017-11-03 NOTE — Progress Notes (Signed)
Per MD okay for RN to change diet order.  

## 2017-11-03 NOTE — Consult Note (Signed)
Cardiology Consultation:   Patient ID: Kristina Archer MRN: [id number]; DOB: [birth year]  Admit date: 11/02/2017 Date of Consult: 11/03/2017  Primary Care Provider: Corky Downs, MD Primary Cardiologist: Julien Nordmann, MD  Primary Electrophysiologist:  None    Patient Profile:   Kristina Archer is a 72 y.o. female with a hx of HTN, HL, DMII, remote tob abuse, GERD, DDD, obesity, and anxiety  who is being seen today for the evaluation of dyspnea/CHF at the request of Konidena.  History of Present Illness:   Kristina Archer  is a 72 y.o. female with a hx of HTN, HL, DMII, remote tob abuse, GERD, DDD, obesity, and anxiety.  She has no prior cardiac history. An echo was done several years ago @ [facility], though we do not have access to those records.  She lives locally and is relatively active w/o significant limitations.  She was in her usoh until the morning of 10/24, when she awoke @ 2am with severe right flank pain that became assoc w/ nausea.  She eventually presented to the ED where CT of the Abd/pelvis, showed a 10 x 13mm R UPJ stone.  Though she did not report chest pain or dyspnea at the time, she was noted to have an elevated trop of 0.09.  She was admitted and seen by urology.  She underwent cystoscopy w/ R ureteral stent placement 10/24.  Trop cont to rise - 0.19  0.25  0.29 earlier this AM.  At ~ 1500 this afternoon, she complained of dyspnea.  She was noted to be tachycardic.  CXR showed pulm edema and she was given 20mg  of IV lasix.  We were consulted.  ECG upon our arrival shows SVT @ 138 bpm.  She is dyspneic @ rest.  She remained tachycardic with both bearing down and carotid massage.  She denies chest pain.  Past Medical History:  Diagnosis Date  . Anxiety   . Cardiomyopathy (HCC)    a. 10/2017 Echo: EF 20-25%, diff HK, mild MR. Nl RV size.  . DDD (degenerative disc disease), lumbar   . Diverticulosis   . GERD (gastroesophageal reflux disease)   . High blood pressure   . High  cholesterol   . LBBB (left bundle branch block)   . Nephrolithiasis   . Sciatica   . Type II diabetes mellitus (HCC)     Past Surgical History:  Procedure Laterality Date  . ABDOMINAL HYSTERECTOMY    . CHOLECYSTECTOMY    . CYSTOSCOPY W/ RETROGRADES Bilateral 05/24/2017   Procedure: CYSTOSCOPY WITH RETROGRADE PYELOGRAM;  Surgeon: Vanna Scotland, MD;  Location: [facility] ORS;  Service: Urology;  Laterality: Bilateral;  . CYSTOSCOPY W/ URETERAL STENT PLACEMENT Right 11/02/2017   Procedure: CYSTOSCOPY WITH RETROGRADE PYELOGRAM/URETERAL STENT PLACEMENT;  Surgeon: Crista Elliot, MD;  Location: [facility] ORS;  Service: Urology;  Laterality: Right;  . CYSTOSCOPY/URETEROSCOPY/HOLMIUM LASER/STENT PLACEMENT Left 05/24/2017   Procedure: CYSTOSCOPY/URETEROSCOPY/HOLMIUM LASER/STENT PLACEMENT;  Surgeon: Vanna Scotland, MD;  Location: [facility] OR[facility];  Service: Urology;  Laterality: Left;     Inpatient Medications: Scheduled Meds: . adenosine (ADENOCARD) IV  12 mg Intravenous Once  . adenosine (ADENOCARD) IV  12 mg Intravenous Once  . docusate sodium  100 mg Oral BID  . enoxaparin (LOVENOX) injection  40 mg Subcutaneous Q24H  . [START ON 11/04/2017] glimepiride  2 mg Oral Q breakfast  . insulin aspart  0-9 Units Subcutaneous TID WC  . insulin aspart  12 Units Subcutaneous Once  . ipratropium-albuterol  3 mL Nebulization Q4H  .  linagliptin  5 mg Oral Daily  . mouth rinse  15 mL Mouth Rinse BID  . pantoprazole  40 mg Oral Daily  . PARoxetine  20 mg Oral QHS  . rosuvastatin  5 mg Oral Daily   Continuous Infusions: . cefTRIAXone (ROCEPHIN)  IV Stopped (11/03/17 1442)   PRN Meds: acetaminophen **OR** acetaminophen, bisacodyl, ipratropium-albuterol, ondansetron **OR** ondansetron (ZOFRAN) IV, traZODone  Allergies:    Allergies  Allergen Reactions  . Codeine Nausea Only    Social History:   Social History   Socioeconomic History  . Marital status: Widowed    Spouse name: Not on file  . Number of  children: Not on file  . Years of education: Not on file  . Highest education level: Not on file  Occupational History  . Not on file  Social Needs  . Financial resource strain: Not on file  . Food insecurity:    Worry: Not on file    Inability: Not on file  . Transportation needs:    Medical: Not on file    Non-medical: Not on file  Tobacco Use  . Smoking status: Former Smoker    Packs/day: 1.50    Years: 15.00    Pack years: 22.50    Types: Cigarettes    Last attempt to quit: 05/12/1997    Years since quitting: 20.4  . Smokeless tobacco: Never Used  Substance and Sexual Activity  . Alcohol use: Not Currently  . Drug use: Never  . Sexual activity: Not Currently  Lifestyle  . Physical activity:    Days per week: Not on file    Minutes per session: Not on file  . Stress: Not on file  Relationships  . Social connections:    Talks on phone: Not on file    Gets together: Not on file    Attends religious service: Not on file    Active member of club or organization: Not on file    Attends meetings of clubs or organizations: Not on file    Relationship status: Not on file  . Intimate partner violence:    Fear of current or ex partner: Not on file    Emotionally abused: Not on file    Physically abused: Not on file    Forced sexual activity: Not on file  Other Topics Concern  . Not on file  Social History Narrative  . Not on file    Family History:    Family History  Problem Relation Age of Onset  . Heart attack Father 65  . Bladder Cancer Neg Hx   . Kidney cancer Neg Hx     ROS:  Please see the history of present illness.  +++ dyspnea, +++ R flank pain on admission.  Chronic lower back pain. All other ROS reviewed and negative.     Physical Exam/Data:   Vitals:   11/03/17 1546 11/03/17 1609 11/03/17 1654 11/03/17 1725  BP: 112/63 120/62  123/70  Pulse:  (!) 144 (!) 143 (!) 139  Resp:  (!) 35    Temp:  100 F (37.8 C)    TempSrc:  Oral    SpO2:  91% 91%  90%  Weight:      Height:        Intake/Output Summary (Last 24 hours) at 11/03/2017 1729 Last data filed at 11/03/2017 1722 Gross per 24 hour  Intake 2330.46 ml  Output 2100 ml  Net 230.46 ml   Filed Weights   11/02/17 1322 11/03/17 0500  Weight: 86.2 kg 91 kg   Body mass index is 35.53 kg/m.  General:  Obese, in no acute distress HEENT: normal Lymph: no adenopathy Neck: obese, difficult to gauge jvp Endocrine:  No thryomegaly Vascular: No carotid bruits; FA pulses 1+ bilaterally without bruits  Cardiac:  normal S1, S2; RRR, tachy, distant; no murmur  Lungs:  Diminished breath sounds bilaterally w/ bibasilar crackles, no wheezing, rhonchi. Abd: Obese, soft, nontender, no hepatomegaly  Ext: no edema Musculoskeletal:  No deformities, BUE and BLE strength normal and equal Skin: warm and dry  Neuro:  CNs 2-12 intact, no focal abnormalities noted Psych:  Normal affect   EKG: SVT, 138, LBBB Telemetry:  SVT  Sinus tach  RSR following adenosine and IV metoprolol.  CV Studies:   Relevant CV Studies: 2D Echocardiogram 10.25.2019  Study Conclusions   - Left ventricle: The cavity size was mildly dilated. Systolic   function was severely reduced. The estimated ejection fraction   was in the range of 20% to 25%. Diffuse hypokinesis. Regional   wall motion abnormalities cannot be excluded. The study is not   technically sufficient to allow evaluation of LV diastolic   function. - Mitral valve: There was mild regurgitation. - Left atrium: The atrium was normal in size. - Right ventricle: Systolic function was normal. - Pulmonary arteries: Systolic pressure could not be accurately   estimated.   Impressions:   - Rhythm is wide complex tachycardia rate 129 bpm. _____________   Laboratory Data:  Chemistry Recent Labs  Lab 11/02/17 1408 11/03/17 0327  NA 134* 135  K 3.6 3.9  CL 102 105  CO2 21* 26  GLUCOSE 350* 278*  BUN 16 22  CREATININE 1.28* 1.33*  CALCIUM  9.9 9.0  GFRNONAA 42* 40*  GFRAA 48* 46*  ANIONGAP 11 4*    Recent Labs  Lab 11/02/17 1408  PROT 7.4  ALBUMIN 4.0  AST 67*  ALT 49*  ALKPHOS 98  BILITOT 1.5*   Hematology Recent Labs  Lab 11/02/17 1408 11/03/17 0327  WBC 11.4* 13.2*  RBC 4.63 4.13  HGB 13.1 11.6*  HCT 38.9 35.1*  MCV 84.0 85.0  MCH 28.3 28.1  MCHC 33.7 33.0  RDW 13.6 14.2  PLT 125* 103*   Cardiac Enzymes Recent Labs  Lab 11/02/17 1408 11/02/17 1825 11/02/17 2134 11/03/17 0327  TROPONINI 0.09* 0.19* 0.25* 0.29*    Radiology/Studies:  Dg Abd 1 View  Result Date: 11/03/2017 CLINICAL DATA:  72 year old female with increased pain after urologic stent placement. EXAM: ABDOMEN - 1 VIEW COMPARISON:  CT Abdomen and Pelvis 11/02/2017. FINDINGS: Two supine views at 1520 hours. Right double-J ureteral stent has been placed. The distal pigtail is appropriately located in the lower central pelvis. The proximal pigtail is partially on looped. The 10 millimeter right ureteropelvic junction obstructing calculus seen by CT yesterday is not clearly identified. Numerous pelvic phleboliths redemonstrated. New moderate gaseous distension of the stomach. Normal bowel gas pattern otherwise. Stable cholecystectomy clips. No acute osseous abnormality identified. IMPRESSION: 1. Right double-J ureteral stent placed. The proximal pigtail of the catheter is partially un-coiled. The distal pigtail appears satisfactory. The 10 mm right UPJ calculus seen by CT yesterday is not clearly identified. 2. Moderate new gaseous distension of the stomach, but normal bowel gas pattern otherwise. Electronically Signed   By: Odessa Fleming M.D.   On: 11/03/2017 15:41   Dg Chest Port 1 View  Result Date: 11/03/2017 CLINICAL DATA:  72 year old female with increasing shortness of breath  after urologic stent placement. Wheezing. EXAM: PORTABLE CHEST 1 VIEW COMPARISON:  CT Abdomen and Pelvis 11/02/2017. FINDINGS: The lung bases were clear on the CT  yesterday. AP seated view today at 1515 hours. Stable mild cardiomegaly. Other mediastinal contours are within normal limits. Visualized tracheal air column is within normal limits. Borderline to mild increased pulmonary interstitial markings now. No pneumothorax or consolidation. Questionable small pleural effusions. Negative visible bowel gas pattern. No acute osseous abnormality identified. IMPRESSION: 1. Mild pulmonary interstitial markings and possibly new small pleural effusions suspicious for Acute Pulmonary Edema. 2. Stable mild cardiomegaly. Electronically Signed   By: Odessa Fleming M.D.   On: 11/03/2017 15:39   Ct Renal Stone Study  Result Date: 11/02/2017 CLINICAL DATA:  Flank pain EXAM: CT ABDOMEN AND PELVIS WITHOUT CONTRAST TECHNIQUE: Multidetector CT imaging of the abdomen and pelvis was performed following the standard protocol without IV contrast. COMPARISON:  Ultrasound 07/21/2017, radiograph 05/22/2017 FINDINGS: Lower chest: Lung bases demonstrate no acute consolidation or effusion. The heart size is normal. Hepatobiliary: No focal liver abnormality is seen. There may be subtle contour nodularity of the surface of the liver. Status post cholecystectomy. No biliary dilatation. Pancreas: Unremarkable. No pancreatic ductal dilatation or surrounding inflammatory changes. Spleen: Normal in size without focal abnormality. Adrenals/Urinary Tract: Adrenal glands are normal. Moderate right perinephric stranding. Moderate right hydronephrosis, secondary to a 10 x 13 mm stone in the proximal right ureter at the UPJ. Additional 8 mm stone lower pole right kidney. 3 mm stone lower pole left kidney. Bladder unremarkable Stomach/Bowel: Stomach is within normal limits. Appendix appears normal. No evidence of bowel wall thickening, distention, or inflammatory changes. Left colon diverticular disease without acute inflammatory process Vascular/Lymphatic: Moderate aortic atherosclerosis. No aneurysm. No significantly  enlarged lymph nodes. Reproductive: Status post hysterectomy.  3.1 cm right ovarian cyst. Other: Negative for free air or free fluid. Musculoskeletal: Mild scoliosis and degenerative change of the spine IMPRESSION: 1. Moderate right hydronephrosis, secondary to a 10 x 13 mm stone at the right UPJ. 2. Intrarenal calculi bilaterally 3. Left colon diverticular disease without acute inflammation 4. 3.1 cm right adnexal cyst. Recommend correlation with pelvic ultrasound which may be performed on a nonemergent basis. Electronically Signed   By: Jasmine Pang M.D.   On: 11/02/2017 14:34    Assessment and Plan:   1. Acute systolic CHF:  Pt admitted w/ right flank pain and found to have R UPJ stone.  Also noted to have an elevated troponin.  Echo this AM showed LV dysfxn w/ an EF of 20-25% and diff HK.  In the setting of IVF, she developed dyspnea and CHF on CXR.  She was found to be tachycardic and in SVT @ 138-139.  She has received lasix 20mg  IV x 1 and is beginning to have response.  We treated SVT w/ adenosine 6mg  x 1 and she broke to sinus tach.  We followed this w/ metoprolol 5mg  x 1 with reduction of HR into the 90's.  We will add coreg 3.125mg  BID, first dose now.  Provided that pressures are stable, we will look to add entresto tomorrow +/- spironolactone depending on renal fxn/K over the weekend.  Given multiple risk factors, she will require an ischemic evaluation  possible cath on Monday.  2.  SVT:  See above. Responded to adenosine.  Adding  blocker as above.  Based on HR's/VS, it looks like SVT most likely started around 1600, thus LV dysfxn on echo preceded development of SVT. F/u bmet/Mg.  3.  Elevated troponin/Demand Ischemia:  Trop was mildly elevated on arrival on 10/24, in the setting of flank pain and has continued to slowly rise.  Echo w/ LV dysfxn but global HK.  CT of abd/pelvis reviewed - minimal coronary Ca2+.  Suspect trop will rise further in setting of tachycardia/SVT.  As above, she  will need ischemic eval.  Adding  blocker. Will increase crestor to 40.  Add asa 81.    4.  DMII:  Hold metformin as she will likely need cath.  Per IM.  5.  Nephrolithiasis:  Per urology.  6.  AKI:  In setting of # 5 and LV dysfxn.  Follow with diuresis.  For questions or updates, please contact [facility] Please consult [email or web address] for contact info under   Signed, Nicolasa Ducking, NP  11/03/2017 5:29 PM

## 2017-11-03 NOTE — Progress Notes (Signed)
Per pt needs to be transfer to 2A.

## 2017-11-03 NOTE — Progress Notes (Signed)
Notified MD pt is having hard time breathing. She is wheezing. She os currrently in 4 liter of oxygen. Per MD nurse to order stat chest x ray, 20 mg of iv lasix. Stop fluids and start breathing tx,

## 2017-11-03 NOTE — Progress Notes (Signed)
6 mg of iv adenosine push by NP and also 5 mg iv of metoprolol. Pt HR was up in the 140's now is back to the 90's.

## 2017-11-03 NOTE — Progress Notes (Signed)
Called by Rn as pt is having SOBand hypoxia,requiring 4litres of o2,immeadiately told RNto stop IV fluids,gave small dose IV lasix,chest xray showed acute pulmonary edema,pt also noted to have tachycardia with HR 140 bpm.Appreciate Dr.Gollan seeing the patient,pt echo showed EF 20%.

## 2017-11-03 NOTE — Progress Notes (Signed)
Urology Consult Follow Up  Subjective: Feeling much better after stent placement.  Afebrile post stent placement.  Urine culture pending  Anti-infectives: Anti-infectives (From admission, onward)   Start     Dose/Rate Route Frequency Ordered Stop   11/03/17 1500  cefTRIAXone (ROCEPHIN) 1 g in sodium chloride 0.9 % 100 mL IVPB     1 g 200 mL/hr over 30 Minutes Intravenous Every 24 hours 11/02/17 1524     11/02/17 1445  cefTRIAXone (ROCEPHIN) 1 g in sodium chloride 0.9 % 100 mL IVPB     1 g 200 mL/hr over 30 Minutes Intravenous  Once 11/02/17 1435 11/02/17 1547      Current Facility-Administered Medications  Medication Dose Route Frequency Provider Last Rate Last Dose  . 0.9 %  sodium chloride infusion   Intravenous Continuous Katha Hamming, MD 100 mL/hr at 11/03/17 1254    . acetaminophen (TYLENOL) tablet 650 mg  650 mg Oral Q6H PRN Katha Hamming, MD   650 mg at 11/03/17 0831   Or  . acetaminophen (TYLENOL) suppository 650 mg  650 mg Rectal Q6H PRN Katha Hamming, MD      . bisacodyl (DULCOLAX) EC tablet 5 mg  5 mg Oral Daily PRN Katha Hamming, MD      . cefTRIAXone (ROCEPHIN) 1 g in sodium chloride 0.9 % 100 mL IVPB  1 g Intravenous Q24H Katha Hamming, MD      . docusate sodium (COLACE) capsule 100 mg  100 mg Oral BID Katha Hamming, MD   100 mg at 11/03/17 1610  . enoxaparin (LOVENOX) injection 40 mg  40 mg Subcutaneous Q24H Katha Hamming, MD   40 mg at 11/03/17 0823  . [START ON 11/04/2017] glimepiride (AMARYL) tablet 2 mg  2 mg Oral Q breakfast Katha Hamming, MD      . insulin aspart (novoLOG) injection 0-9 Units  0-9 Units Subcutaneous TID WC Katha Hamming, MD   5 Units at 11/03/17 1131  . linagliptin (TRADJENTA) tablet 5 mg  5 mg Oral Daily Katha Hamming, MD   5 mg at 11/03/17 1254  . MEDLINE mouth rinse  15 mL Mouth Rinse BID Katha Hamming, MD   15 mL at 11/02/17 2123  . ondansetron (ZOFRAN) tablet 4 mg  4  mg Oral Q6H PRN Katha Hamming, MD       Or  . ondansetron (ZOFRAN) injection 4 mg  4 mg Intravenous Q6H PRN Katha Hamming, MD      . pantoprazole (PROTONIX) EC tablet 40 mg  40 mg Oral Daily Katha Hamming, MD   40 mg at 11/03/17 9604  . PARoxetine (PAXIL) tablet 20 mg  20 mg Oral QHS Katha Hamming, MD   20 mg at 11/02/17 2122  . rosuvastatin (CRESTOR) tablet 5 mg  5 mg Oral Daily Katha Hamming, MD   5 mg at 11/03/17 5409  . traZODone (DESYREL) tablet 25 mg  25 mg Oral QHS PRN Katha Hamming, MD   25 mg at 11/02/17 2312     Objective: Vital signs in last 24 hours: Temp:  [97.5 F (36.4 C)-100.3 F (37.9 C)] 98.7 F (37.1 C) (10/25 1243) Pulse Rate:  [86-119] 119 (10/25 1243) Resp:  [16-29] 24 (10/25 1243) BP: (84-141)/(37-68) 112/57 (10/25 1243) SpO2:  [88 %-97 %] 90 % (10/25 1243) Weight:  [91 kg] 91 kg (10/25 0500)  Intake/Output from previous day: 10/24 0701 - 10/25 0700 In: 3155 [P.O.:240; I.V.:700; IV Piggyback:2215] Out: 725 [Urine:725] Intake/Output this shift: Total I/O In: 682.3 [P.O.:240;  I.V.:442.3] Out: -    Physical Exam: Alert, in no acute distress  Lab Results:  Recent Labs    11/02/17 1408 11/03/17 0327  WBC 11.4* 13.2*  HGB 13.1 11.6*  HCT 38.9 35.1*  PLT 125* 103*   BMET Recent Labs    11/02/17 1408 11/03/17 0327  NA 134* 135  K 3.6 3.9  CL 102 105  CO2 21* 26  GLUCOSE 350* 278*  BUN 16 22  CREATININE 1.28* 1.33*  CALCIUM 9.9 9.0    Assessment: s/p Procedure(s): CYSTOSCOPY WITH RETROGRADE PYELOGRAM/URETERAL STENT PLACEMENT  Clinical improvement after stent placement.  Plan:  1.  Continue IV antibiotic pending cultures 2.  Would discharge on 10 to 14 days oral antibiotics. 3.  Okay to start aspirin 4.  KUB ordered to see if stone visualized on plain x-ray    LOS: 1 day    Verna Czech [facility] 11/03/2017

## 2017-11-03 NOTE — Progress Notes (Signed)
Notified Md pt BG was 429 per MD cover with sliding scale plus order 12 units of short acting insulin.

## 2017-11-04 DIAGNOSIS — J9601 Acute respiratory failure with hypoxia: Secondary | ICD-10-CM

## 2017-11-04 DIAGNOSIS — I248 Other forms of acute ischemic heart disease: Secondary | ICD-10-CM

## 2017-11-04 DIAGNOSIS — I5021 Acute systolic (congestive) heart failure: Secondary | ICD-10-CM

## 2017-11-04 LAB — PROTIME-INR
INR: 1.29
Prothrombin Time: 16 seconds — ABNORMAL HIGH (ref 11.4–15.2)

## 2017-11-04 LAB — BASIC METABOLIC PANEL
Anion gap: 9 (ref 5–15)
Anion gap: 8 (ref 5–15)
BUN: 23 mg/dL (ref 8–23)
BUN: 30 mg/dL — AB (ref 8–23)
CALCIUM: 9.4 mg/dL (ref 8.9–10.3)
CHLORIDE: 104 mmol/L (ref 98–111)
CHLORIDE: 104 mmol/L (ref 98–111)
CO2: 24 mmol/L (ref 22–32)
CO2: 25 mmol/L (ref 22–32)
CREATININE: 1.45 mg/dL — AB (ref 0.44–1.00)
Calcium: 9.4 mg/dL (ref 8.9–10.3)
Creatinine, Ser: 1.36 mg/dL — ABNORMAL HIGH (ref 0.44–1.00)
GFR calc Af Amer: 45 mL/min — ABNORMAL LOW (ref >60)
GFR calc non Af Amer: 36 mL/min — ABNORMAL LOW (ref >60)
GFR calc non Af Amer: 39 mL/min — ABNORMAL LOW (ref >60)
GFR, EST AFRICAN AMERICAN: 42 mL/min — AB (ref >60)
GLUCOSE: 349 mg/dL — AB (ref 70–99)
Glucose, Bld: 258 mg/dL — ABNORMAL HIGH (ref 70–99)
Potassium: 4 mmol/L (ref 3.5–5.1)
Potassium: 3.9 mmol/L (ref 3.5–5.1)
SODIUM: 137 mmol/L (ref 135–145)
Sodium: 137 mmol/L (ref 135–145)

## 2017-11-04 LAB — APTT: APTT: 42 s — AB (ref 24–36)

## 2017-11-04 LAB — GLUCOSE, CAPILLARY
GLUCOSE-CAPILLARY: 323 mg/dL — AB (ref 70–99)
GLUCOSE-CAPILLARY: 333 mg/dL — AB (ref 70–99)
Glucose-Capillary: 221 mg/dL — ABNORMAL HIGH (ref 70–99)
Glucose-Capillary: 228 mg/dL — ABNORMAL HIGH (ref 70–99)
Glucose-Capillary: 280 mg/dL — ABNORMAL HIGH (ref 70–99)

## 2017-11-04 LAB — BLOOD GAS, ARTERIAL
Acid-Base Excess: 0.7 mmol/L (ref 0.0–2.0)
Bicarbonate: 24.2 mmol/L (ref 20.0–28.0)
Delivery systems: POSITIVE
EXPIRATORY PAP: 5
FIO2: 60
INSPIRATORY PAP: 10
O2 Saturation: 92 %
PCO2 ART: 34 mmHg (ref 32.0–48.0)
PH ART: 7.46 — AB (ref 7.350–7.450)
Patient temperature: 37
pO2, Arterial: 60 mmHg — ABNORMAL LOW (ref 83.0–108.0)

## 2017-11-04 LAB — MRSA PCR SCREENING: MRSA BY PCR: NEGATIVE

## 2017-11-04 LAB — CBC
HEMATOCRIT: 34.4 % — AB (ref 36.0–46.0)
HEMATOCRIT: 31.5 % — AB (ref 36.0–46.0)
HEMOGLOBIN: 11.4 g/dL — AB (ref 12.0–15.0)
HEMOGLOBIN: 10.4 g/dL — AB (ref 12.0–15.0)
MCH: 28 pg (ref 26.0–34.0)
MCH: 27.9 pg (ref 26.0–34.0)
MCHC: 33.1 g/dL (ref 30.0–36.0)
MCHC: 33 g/dL (ref 30.0–36.0)
MCV: 84.5 fL (ref 80.0–100.0)
MCV: 84.5 fL (ref 80.0–100.0)
NRBC: 0 % (ref 0.0–0.2)
Platelets: 99 10*3/uL — ABNORMAL LOW (ref 150–400)
Platelets: 98 10*3/uL — ABNORMAL LOW (ref 150–400)
RBC: 4.07 MIL/uL (ref 3.87–5.11)
RBC: 3.73 MIL/uL — ABNORMAL LOW (ref 3.87–5.11)
RDW: 14.2 % (ref 11.5–15.5)
RDW: 14.3 % (ref 11.5–15.5)
WBC: 9.2 10*3/uL (ref 4.0–10.5)
WBC: 5.5 10*3/uL (ref 4.0–10.5)
nRBC: 0 % (ref 0.0–0.2)

## 2017-11-04 LAB — MAGNESIUM: Magnesium: 1.8 mg/dL (ref 1.7–2.4)

## 2017-11-04 LAB — HIV ANTIBODY (ROUTINE TESTING W REFLEX): HIV Screen 4th Generation wRfx: NONREACTIVE

## 2017-11-04 LAB — HEPARIN LEVEL (UNFRACTIONATED)
HEPARIN UNFRACTIONATED: 0.39 [IU]/mL (ref 0.30–0.70)
Heparin Unfractionated: 0.37 IU/mL (ref 0.30–0.70)

## 2017-11-04 LAB — TROPONIN I: TROPONIN I: 0.8 ng/mL — AB (ref <0.03)

## 2017-11-04 LAB — PHOSPHORUS: Phosphorus: 2.5 mg/dL (ref 2.5–4.6)

## 2017-11-04 MED ORDER — FUROSEMIDE 10 MG/ML IJ SOLN: 40.0000 mg | Freq: Two times a day (BID) | INTRAMUSCULAR | Status: DC

## 2017-11-04 MED ORDER — HEPARIN (PORCINE) IN NACL 100-0.45 UNIT/ML-% IJ SOLN
1250.0000 [IU]/h | INTRAMUSCULAR | Status: DC
  Administered 2017-11-04: 850 [IU]/h via INTRAVENOUS
  Administered 2017-11-06 – 2017-11-09 (×4): 1250 [IU]/h via INTRAVENOUS
  Filled 2017-11-04 (×6): qty 250

## 2017-11-04 MED ORDER — METHYLPREDNISOLONE SODIUM SUCC 40 MG IJ SOLR
40.0000 mg | Freq: Three times a day (TID) | INTRAMUSCULAR | Status: AC
  Administered 2017-11-04 – 2017-11-06 (×6): 40 mg via INTRAVENOUS
  Filled 2017-11-04 (×6): qty 1

## 2017-11-04 MED ORDER — METHYLPREDNISOLONE SODIUM SUCC 125 MG IJ SOLR
125.0000 mg | Freq: Once | INTRAMUSCULAR | Status: AC
  Administered 2017-11-04: 125 mg via INTRAVENOUS
  Filled 2017-11-04: qty 2

## 2017-11-04 MED ORDER — FUROSEMIDE 10 MG/ML IJ SOLN
40.0000 mg | Freq: Once | INTRAMUSCULAR | Status: AC
  Administered 2017-11-04: 40 mg via INTRAVENOUS
  Filled 2017-11-04: qty 4

## 2017-11-04 MED ORDER — HEPARIN BOLUS VIA INFUSION: 2000.0000 [IU] | Freq: Once | INTRAVENOUS | Status: DC

## 2017-11-04 MED ORDER — LORAZEPAM 2 MG/ML IJ SOLN
1.0000 mg | INTRAMUSCULAR | Status: AC | PRN
  Administered 2017-11-04 – 2017-11-12 (×2): 1 mg via INTRAVENOUS
  Filled 2017-11-04 (×2): qty 1

## 2017-11-04 MED ORDER — IPRATROPIUM-ALBUTEROL 0.5-2.5 (3) MG/3ML IN SOLN
3.0000 mL | Freq: Four times a day (QID) | RESPIRATORY_TRACT | Status: DC
  Administered 2017-11-04 – 2017-11-08 (×17): 3 mL via RESPIRATORY_TRACT
  Filled 2017-11-04 (×17): qty 3

## 2017-11-04 NOTE — Progress Notes (Signed)
Patient transferred to ICU bed 19. Report given to [facility], ICU nurse. Patient on non-rebreather upon transfer.

## 2017-11-04 NOTE — Consult Note (Signed)
ANTICOAGULATION CONSULT NOTE - Initial Consult  Pharmacy Consult for heparin infusion Indication: chest pain/ACS  Allergies  Allergen Reactions  . Codeine Nausea Only    Patient Measurements: Height: 5\' 3"  (160 cm) Weight: 203 lb 0.7 oz (92.1 kg) IBW/kg (Calculated) : 52.4 Heparin Dosing Weight: 71.7  Vital Signs: Temp: 99.9 F (37.7 C) (10/26 0832) Temp Source: Oral (10/26 0832) BP: 119/63 (10/26 0832) Pulse Rate: 102 (10/26 0832)  Labs: Recent Labs    11/02/17 1408  11/03/17 0327 11/03/17 1801 11/03/17 2315 11/04/17 0408 11/04/17 0750  HGB 13.1  --  11.6* 11.8*  --   --   --   HCT 38.9  --  35.1* 35.3*  --   --   --   PLT 125*  --  103* 88*  --   --   --   CREATININE 1.28*  --  1.33* 1.37*  --   --  1.45*  TROPONINI 0.09*   < > 0.29* 0.27* 0.71* 0.80*  --    < > = values in this interval not displayed.    Estimated Creatinine Clearance: 39.5 mL/min (A) (by C-G formula based on SCr of 1.45 mg/dL (H)).   Medical History: Past Medical History:  Diagnosis Date  . Anxiety   . Cardiomyopathy (HCC)    a. 10/2017 Echo: EF 20-25%, diff HK, mild MR. Nl RV size.  . DDD (degenerative disc disease), lumbar   . Diverticulosis   . GERD (gastroesophageal reflux disease)   . High blood pressure   . High cholesterol   . LBBB (left bundle branch block)   . Nephrolithiasis   . Sciatica   . Type II diabetes mellitus (HCC)     Medications:  Medications Prior to Admission  Medication Sig Dispense Refill Last Dose  . amLODipine-benazepril (LOTREL) 5-10 MG capsule Take 1 capsule by mouth daily.   11/01/2017 at 0800  . dexlansoprazole (DEXILANT) 60 MG capsule Take 60 mg by mouth daily before breakfast.   11/01/2017 at 0700  . docusate sodium (COLACE) 100 MG capsule Take 1 capsule (100 mg total) by mouth 2 (two) times daily. 60 capsule 0 Unknown at PRN  . furosemide (LASIX) 20 MG tablet Take 20 mg by mouth daily.   11/01/2017 at 0800  . gabapentin (NEURONTIN) 100 MG capsule  Take 100 mg by mouth 3 (three) times daily as needed (back pain).   Unknown at PRN  . glimepiride (AMARYL) 2 MG tablet Take 2 mg by mouth daily with breakfast.   11/01/2017 at 0800  . metFORMIN (GLUCOPHAGE-XR) 500 MG 24 hr tablet Take 500 mg by mouth 2 (two) times daily.   11/01/2017 at 1800  . Multiple Vitamin (MULTIVITAMIN WITH MINERALS) TABS tablet Take 1 tablet by mouth daily.   11/01/2017 at 0800  . PARoxetine (PAXIL) 20 MG tablet Take 20 mg by mouth at bedtime.   11/01/2017 at 2000  . rosuvastatin (CRESTOR) 5 MG tablet Take 5 mg by mouth daily.    11/01/2017 at 0800  . sitaGLIPtin (JANUVIA) 100 MG tablet Take 100 mg by mouth daily.   11/01/2017 at 0800  . HYDROcodone-acetaminophen (NORCO/VICODIN) 5-325 MG tablet Take 1-2 tablets by mouth every 6 (six) hours as needed for moderate pain. (Patient not taking: Reported on 11/02/2017) 10 tablet 0 Not Taking at Unknown time  . nitrofurantoin, macrocrystal-monohydrate, (MACROBID) 100 MG capsule Take 1 capsule (100 mg total) by mouth every 12 (twelve) hours. (Patient not taking: Reported on 11/02/2017) 20 capsule 0 Not Taking  at Unknown time  . oxybutynin (DITROPAN) 5 MG tablet Take 1 tablet (5 mg total) by mouth every 8 (eight) hours as needed for bladder spasms. (Patient not taking: Reported on 11/02/2017) 30 tablet 0 Not Taking at Unknown time  . sulfamethoxazole-trimethoprim (BACTRIM DS,SEPTRA DS) 800-160 MG tablet Take 1 tablet by mouth every 12 (twelve) hours. (Patient not taking: Reported on 11/02/2017) 14 tablet 0 Not Taking at Unknown time  . tamsulosin (FLOMAX) 0.4 MG CAPS capsule Take 1 capsule (0.4 mg total) by mouth daily. (Patient not taking: Reported on 11/02/2017) 30 capsule 0 Not Taking at Unknown time    Assessment: 72 y.o. female with history of HTN, HL, DMII, remote tob abuse, GERD, DDD, obesity, and anxiety, admitted with ureteral stone, complicated by acute systolic heart failure and elevated troponin.  Goal of Therapy:  Heparin  level 0.3-0.7 units/ml Monitor platelets by anticoagulation protocol: Yes   Plan:  Since patient received Lovenox 40 mg at 1000 and platelet count of 99 will hold bolus and follow up need for bolus after first heparin level. Start heparin infusion at 850 units/hr Will draw heparin level every 6 hours until 2 consecutive therapeutic levels and then daily and CBC daily. Continue to monitor H&H and platelets  Orinda Kenner, PharmD 11/04/2017,10:46 AM

## 2017-11-04 NOTE — Progress Notes (Signed)
Family Meeting Note  Advance Directive:yes  Spoke with the patient today.  Diagnosed with acute systolic heart failure and EF 16% and she is having shortness of breath with minimal her to ICU for BiPAP support and she wants to be DNR and does not want CPR or intubation.  Same explained to patient's son as well.  The following clinical team members were present during this meeting:MD  The following were discussed:Patient's diagnosis: , Patient's progosis: Unable to determine and Goals for treatment: DNR  Additional follow-up to be provided::WILL FOLLOW  Make further recommendation.  Time spent during discussion:18 MIN  Katha Hamming, MD

## 2017-11-04 NOTE — Consult Note (Signed)
ANTICOAGULATION CONSULT NOTE - Initial Consult  Pharmacy Consult for heparin infusion Indication: chest pain/ACS  Allergies  Allergen Reactions  . Codeine Nausea Only    Patient Measurements: Height: 5\' 3"  (160 cm) Weight: 203 lb 0.7 oz (92.1 kg) IBW/kg (Calculated) : 52.4 Heparin Dosing Weight: 71.7  Vital Signs: Temp: 98.7 F (37.1 C) (10/26 1600) Temp Source: Oral (10/26 1600) BP: 109/55 (10/26 1600) Pulse Rate: 98 (10/26 1600)  Labs: Recent Labs    11/03/17 0327 11/03/17 1801 11/03/17 2315 11/04/17 0408 11/04/17 0750 11/04/17 1047 11/04/17 1720  HGB 11.6* 11.8*  --   --   --  11.4*  --   HCT 35.1* 35.3*  --   --   --  34.4*  --   PLT 103* 88*  --   --   --  99*  --   APTT  --   --   --   --   --  42*  --   LABPROT  --   --   --   --   --  16.0*  --   INR  --   --   --   --   --  1.29  --   HEPARINUNFRC  --   --   --   --   --   --  0.39  CREATININE 1.33* 1.37*  --   --  1.45*  --   --   TROPONINI 0.29* 0.27* 0.71* 0.80*  --   --   --     Estimated Creatinine Clearance: 39.5 mL/min (A) (by C-G formula based on SCr of 1.45 mg/dL (H)).   Medical History: Past Medical History:  Diagnosis Date  . Anxiety   . Cardiomyopathy (HCC)    a. 10/2017 Echo: EF 20-25%, diff HK, mild MR. Nl RV size.  . DDD (degenerative disc disease), lumbar   . Diverticulosis   . GERD (gastroesophageal reflux disease)   . High blood pressure   . High cholesterol   . LBBB (left bundle branch block)   . Nephrolithiasis   . Sciatica   . Type II diabetes mellitus (HCC)     Medications:  Medications Prior to Admission  Medication Sig Dispense Refill Last Dose  . amLODipine-benazepril (LOTREL) 5-10 MG capsule Take 1 capsule by mouth daily.   11/01/2017 at 0800  . dexlansoprazole (DEXILANT) 60 MG capsule Take 60 mg by mouth daily before breakfast.   11/01/2017 at 0700  . docusate sodium (COLACE) 100 MG capsule Take 1 capsule (100 mg total) by mouth 2 (two) times daily. 60 capsule 0  Unknown at PRN  . furosemide (LASIX) 20 MG tablet Take 20 mg by mouth daily.   11/01/2017 at 0800  . gabapentin (NEURONTIN) 100 MG capsule Take 100 mg by mouth 3 (three) times daily as needed (back pain).   Unknown at PRN  . glimepiride (AMARYL) 2 MG tablet Take 2 mg by mouth daily with breakfast.   11/01/2017 at 0800  . metFORMIN (GLUCOPHAGE-XR) 500 MG 24 hr tablet Take 500 mg by mouth 2 (two) times daily.   11/01/2017 at 1800  . Multiple Vitamin (MULTIVITAMIN WITH MINERALS) TABS tablet Take 1 tablet by mouth daily.   11/01/2017 at 0800  . PARoxetine (PAXIL) 20 MG tablet Take 20 mg by mouth at bedtime.   11/01/2017 at 2000  . rosuvastatin (CRESTOR) 5 MG tablet Take 5 mg by mouth daily.    11/01/2017 at 0800  . sitaGLIPtin (JANUVIA) 100 MG tablet  Take 100 mg by mouth daily.   11/01/2017 at 0800  . HYDROcodone-acetaminophen (NORCO/VICODIN) 5-325 MG tablet Take 1-2 tablets by mouth every 6 (six) hours as needed for moderate pain. (Patient not taking: Reported on 11/02/2017) 10 tablet 0 Not Taking at Unknown time  . nitrofurantoin, macrocrystal-monohydrate, (MACROBID) 100 MG capsule Take 1 capsule (100 mg total) by mouth every 12 (twelve) hours. (Patient not taking: Reported on 11/02/2017) 20 capsule 0 Not Taking at Unknown time  . oxybutynin (DITROPAN) 5 MG tablet Take 1 tablet (5 mg total) by mouth every 8 (eight) hours as needed for bladder spasms. (Patient not taking: Reported on 11/02/2017) 30 tablet 0 Not Taking at Unknown time  . sulfamethoxazole-trimethoprim (BACTRIM DS,SEPTRA DS) 800-160 MG tablet Take 1 tablet by mouth every 12 (twelve) hours. (Patient not taking: Reported on 11/02/2017) 14 tablet 0 Not Taking at Unknown time  . tamsulosin (FLOMAX) 0.4 MG CAPS capsule Take 1 capsule (0.4 mg total) by mouth daily. (Patient not taking: Reported on 11/02/2017) 30 capsule 0 Not Taking at Unknown time    Assessment: 72 y.o. female with history of HTN, HL, DMII, remote tob abuse, GERD, DDD,  obesity, and anxiety, admitted with ureteral stone, complicated by acute systolic heart failure and elevated troponin.  Goal of Therapy:  Heparin level 0.3-0.7 units/ml Monitor platelets by anticoagulation protocol: Yes   Plan:  Since patient received Lovenox 40 mg at 1000 and platelet count of 99 will hold bolus and follow up need for bolus after first heparin level. Start heparin infusion at 850 units/hr  10/26 1700 heparin level 0.39.  Will maintain current rate of 850 unit/hr and recheck heparin level in 6 hours.  Will draw heparin level every 6 hours until 2 consecutive therapeutic levels and then daily and CBC daily. Continue to monitor H&H and platelets  Orinda Kenner, PharmD 11/04/2017,6:14 PM

## 2017-11-04 NOTE — Progress Notes (Signed)
Progress Note  Patient Name: Kristina Archer Date of Encounter: 11/04/2017  Primary Cardiologist: Julien Nordmann, MD   Subjective   No chest pain or abdominal/flank pain.  Notes continued shortness of breath and orthopnea.  Inpatient Medications    Scheduled Meds: . aspirin  81 mg Oral Daily  . carvedilol  3.125 mg Oral BID WC  . docusate sodium  100 mg Oral BID  . enoxaparin (LOVENOX) injection  40 mg Subcutaneous Q24H  . furosemide  40 mg Intravenous Daily  . haloperidol lactate  5 mg Intravenous Once  . insulin aspart  0-9 Units Subcutaneous TID WC  . linagliptin  5 mg Oral Daily  . mouth rinse  15 mL Mouth Rinse BID  . pantoprazole  40 mg Oral Daily  . PARoxetine  20 mg Oral QHS  . potassium chloride  20 mEq Oral Daily  . rosuvastatin  40 mg Oral Daily   Continuous Infusions: . cefTRIAXone (ROCEPHIN)  IV Stopped (11/03/17 1442)   PRN Meds: acetaminophen **OR** acetaminophen, bisacodyl, levalbuterol, metoprolol tartrate, ondansetron **OR** ondansetron (ZOFRAN) IV, traZODone   Vital Signs    Vitals:   11/03/17 1835 11/03/17 2000 11/04/17 0337 11/04/17 0832  BP: 118/60 (!) 108/34 (!) 127/53 119/63  Pulse: (!) 103 94 (!) 104 (!) 102  Resp: 18 (!) 22 17 (!) 22  Temp: 100.3 F (37.9 C) 98.3 F (36.8 C) 98.4 F (36.9 C) 99.9 F (37.7 C)  TempSrc: Oral Oral Oral Oral  SpO2: (!) 89% 90% 90% 90%  Weight:   92.1 kg   Height:        Intake/Output Summary (Last 24 hours) at 11/04/2017 1013 Last data filed at 11/04/2017 1008 Gross per 24 hour  Intake 1430.46 ml  Output 2525 ml  Net -1094.54 ml   Filed Weights   11/02/17 1322 11/03/17 0500 11/04/17 0337  Weight: 86.2 kg 91 kg 92.1 kg    Telemetry    Sinus tachycardia with IVCD - Personally Reviewed  ECG    NSR with LBBB  - Personally Reviewed  Physical Exam   GEN: Lying in bed with increased work of breathing; receiving 6L oxygen via nasal cannula   Neck: JVP > 10 cm Cardiac: Tachycardic but regular  without murmurs. Respiratory: Accessory muscle use noted.  Breath sounds diffusely diminished with bibasilar crackles. GI: Soft, nontender, non-distended  MS: Trace pretibial; No deformity. Neuro:  Nonfocal  Psych: Normal affect   Labs    Chemistry Recent Labs  Lab 11/02/17 1408 11/03/17 0327 11/03/17 1801 11/04/17 0750  NA 134* 135 134* 137  K 3.6 3.9 3.3* 4.0  CL 102 105 103 104  CO2 21* 26 20* 24  GLUCOSE 350* 278* 362* 258*  BUN 16 22 20 23   CREATININE 1.28* 1.33* 1.37* 1.45*  CALCIUM 9.9 9.0 9.1 9.4  PROT 7.4  --   --   --   ALBUMIN 4.0  --   --   --   AST 67*  --   --   --   ALT 49*  --   --   --   ALKPHOS 98  --   --   --   BILITOT 1.5*  --   --   --   GFRNONAA 42* 40* 38* 36*  GFRAA 48* 46* 44* 42*  ANIONGAP 11 4* 11 9     Hematology Recent Labs  Lab 11/02/17 1408 11/03/17 0327 11/03/17 1801  WBC 11.4* 13.2* 9.6  RBC 4.63 4.13 4.21  HGB 13.1 11.6* 11.8*  HCT 38.9 35.1* 35.3*  MCV 84.0 85.0 83.8  MCH 28.3 28.1 28.0  MCHC 33.7 33.0 33.4  RDW 13.6 14.2 14.2  PLT 125* 103* 88*    Cardiac Enzymes Recent Labs  Lab 11/03/17 0327 11/03/17 1801 11/03/17 2315 11/04/17 0408  TROPONINI 0.29* 0.27* 0.71* 0.80*   No results for input(s): TROPIPOC in the last 168 hours.   BNP Recent Labs  Lab 11/03/17 1801  BNP 862.0*     DDimer No results for input(s): DDIMER in the last 168 hours.   Radiology    Dg Abd 1 View  Result Date: 11/03/2017 CLINICAL DATA:  72 year old female with increased pain after urologic stent placement. EXAM: ABDOMEN - 1 VIEW COMPARISON:  CT Abdomen and Pelvis 11/02/2017. FINDINGS: Two supine views at 1520 hours. Right double-J ureteral stent has been placed. The distal pigtail is appropriately located in the lower central pelvis. The proximal pigtail is partially on looped. The 10 millimeter right ureteropelvic junction obstructing calculus seen by CT yesterday is not clearly identified. Numerous pelvic phleboliths  redemonstrated. New moderate gaseous distension of the stomach. Normal bowel gas pattern otherwise. Stable cholecystectomy clips. No acute osseous abnormality identified. IMPRESSION: 1. Right double-J ureteral stent placed. The proximal pigtail of the catheter is partially un-coiled. The distal pigtail appears satisfactory. The 10 mm right UPJ calculus seen by CT yesterday is not clearly identified. 2. Moderate new gaseous distension of the stomach, but normal bowel gas pattern otherwise. Electronically Signed   By: Odessa Fleming M.D.   On: 11/03/2017 15:41   Dg Chest Port 1 View  Result Date: 11/03/2017 CLINICAL DATA:  72 year old female with increasing shortness of breath after urologic stent placement. Wheezing. EXAM: PORTABLE CHEST 1 VIEW COMPARISON:  CT Abdomen and Pelvis 11/02/2017. FINDINGS: The lung bases were clear on the CT yesterday. AP seated view today at 1515 hours. Stable mild cardiomegaly. Other mediastinal contours are within normal limits. Visualized tracheal air column is within normal limits. Borderline to mild increased pulmonary interstitial markings now. No pneumothorax or consolidation. Questionable small pleural effusions. Negative visible bowel gas pattern. No acute osseous abnormality identified. IMPRESSION: 1. Mild pulmonary interstitial markings and possibly new small pleural effusions suspicious for Acute Pulmonary Edema. 2. Stable mild cardiomegaly. Electronically Signed   By: Odessa Fleming M.D.   On: 11/03/2017 15:39   Ct Renal Stone Study  Result Date: 11/02/2017 CLINICAL DATA:  Flank pain EXAM: CT ABDOMEN AND PELVIS WITHOUT CONTRAST TECHNIQUE: Multidetector CT imaging of the abdomen and pelvis was performed following the standard protocol without IV contrast. COMPARISON:  Ultrasound 07/21/2017, radiograph 05/22/2017 FINDINGS: Lower chest: Lung bases demonstrate no acute consolidation or effusion. The heart size is normal. Hepatobiliary: No focal liver abnormality is seen. There may be  subtle contour nodularity of the surface of the liver. Status post cholecystectomy. No biliary dilatation. Pancreas: Unremarkable. No pancreatic ductal dilatation or surrounding inflammatory changes. Spleen: Normal in size without focal abnormality. Adrenals/Urinary Tract: Adrenal glands are normal. Moderate right perinephric stranding. Moderate right hydronephrosis, secondary to a 10 x 13 mm stone in the proximal right ureter at the UPJ. Additional 8 mm stone lower pole right kidney. 3 mm stone lower pole left kidney. Bladder unremarkable Stomach/Bowel: Stomach is within normal limits. Appendix appears normal. No evidence of bowel wall thickening, distention, or inflammatory changes. Left colon diverticular disease without acute inflammatory process Vascular/Lymphatic: Moderate aortic atherosclerosis. No aneurysm. No significantly enlarged lymph nodes. Reproductive: Status post hysterectomy.  3.1 cm  right ovarian cyst. Other: Negative for free air or free fluid. Musculoskeletal: Mild scoliosis and degenerative change of the spine IMPRESSION: 1. Moderate right hydronephrosis, secondary to a 10 x 13 mm stone at the right UPJ. 2. Intrarenal calculi bilaterally 3. Left colon diverticular disease without acute inflammation 4. 3.1 cm right adnexal cyst. Recommend correlation with pelvic ultrasound which may be performed on a nonemergent basis. Electronically Signed   By: Jasmine Pang M.D.   On: 11/02/2017 14:34    Cardiac Studies   Echocardiogram (11/03/17): - Left ventricle: The cavity size was mildly dilated. Systolic   function was severely reduced. The estimated ejection fraction   was in the range of 20% to 25%. Diffuse hypokinesis. Regional   wall motion abnormalities cannot be excluded. The study is not   technically sufficient to allow evaluation of LV diastolic   function. - Mitral valve: There was mild regurgitation. - Left atrium: The atrium was normal in size. - Right ventricle: Systolic  function was normal. - Pulmonary arteries: Systolic pressure could not be accurately   estimated.  Patient Profile     72 y.o. female with history of HTN, HL, DMII, remote tob abuse, GERD, DDD, obesity, and anxiety, admitted with ureteral stone, complicated by acute systolic heart failure and elevated troponin.  Assessment & Plan    Acute systolic heart failure and acute respiratory failure with hypoxia Patient continues to appear volume overloaded with increased work of breathing.  Small bump in creatinine noted.  I think that she will continue to need aggressive diuresis.  No IVF.  Increase furosemide to 40 mg IV BID.  Continue carvedilol 3.125 mg BID.  If respiratory status worsens, consider transfer to stepdown/ICU for BiPAP.  May need to consider inotropic support if worsening heart failure/renal function with diuresis.  Continue bronchodilators per internal medicine.  Demand ischemia I suspect elevated troponin is due to supply-demand mismatch in the setting of acute systolic heart failure and likely underlying 3-vessel CAD (significant coronary artery calcifications noted on CT).  Patient denies chest pain, though it is possible that shortness of breath could be an anginal equivalent.  EKG difficult to interpret for ischemic changes, given underlying LBBB.  PE is also a possibility, given dyspnea, hypoxia, and elevated troponin, though overall picture is more consistent with a primary cardiac etiology.  Continue ASA and carvedilol.  Initiate heparin infusion (ok with urology).  Continue high-intensity statin therapy.  Patient will need cardiac catheterization, though in the setting of worsening renal insufficiency and respiratory failure, I would favor deferring until her clinical status has improved.  Ureteral stone Symptoms have resolved.  Continued treatment per urology and internal medicine.  Case discussed with internal medicine and urology.  For questions or  updates, please contact [facility] Please consult [email or web address] for contact info under [facility].  Signed, Yvonne Kendall, MD  11/04/2017, 10:13 AM

## 2017-11-04 NOTE — Progress Notes (Signed)
[facility] - [place] at [facility]   PATIENT NAME: Abigaile Rossie    MR#:  [id number]  DATE OF BIRTH:  01-Jan-1946 Patient had sinus tachycardia yesterday with heart rate up to 140 bpm, received 12 mg adenosine, metoprolol, transferred to telemetry.  Patient right now on 4 L of oxygen and saturation around 89 to 90% she says she is having shortness of breath but denies any chest pain.  EF is 20% by echocardiogram with wall motion abnormality.  Her intake is poor today morning.  Had episode of confusion last night received Haldol.   Chief Complaint  Patient presents with  . Back Pain  Complains of shortness of breath.  REVIEW OF SYSTEMS:   ROS CONSTITUTIONAL: No fever, fatigue or weakness.  EYES: No blurred or double vision.  EARS, NOSE, AND THROAT: No tinnitus or ear pain.  RESPIRATORY: Complains of shortness of breath, appears uncomfortable.Marland Kitchen  CARDIOVASCULAR: No chest pain, orthopnea, edema.  GASTROINTESTINAL: No nausea, vomiting, diarrhea or abdominal pain.  GENITOURINARY: No dysuria, hematuria.  ENDOCRINE: No polyuria, nocturia,  HEMATOLOGY: No anemia, easy bruising or bleeding SKIN: No rash or lesion. MUSCULOSKELETAL: No joint pain or arthritis.   NEUROLOGIC: No tingling, numbness, weakness.  PSYCHIATRY: No anxiety or depression.   DRUG ALLERGIES:   Allergies  Allergen Reactions  . Codeine Nausea Only    VITALS:  Blood pressure 119/63, pulse (!) 102, temperature 99.9 F (37.7 C), temperature source Oral, resp. rate (!) 22, height 5\' 3"  (1.6 m), weight 92.1 kg, SpO2 90 %.  PHYSICAL EXAMINATION:  GENERAL:  72 y.o.-year-old patient lying in the bed with no acute distress.  EYES: Pupils equal, round, reactive to light and accommodation. No scleral icterus. Extraocular muscles intact.  HEENT: Head atraumatic, normocephalic. Oropharynx and nasopharynx clear.  NECK:  Supple, no jugular venous distention. No thyroid enlargement, no tenderness.  LUNGS:  bilateral basilar crepitations present.  CARDIOVASCULAR: S1, S2 normal. No murmurs, rubs, or gallops.  ABDOMEN: Soft, nontender, nondistended. Bowel sounds present. No organomegaly or mass.  EXTREMITIES: No pedal edema, cyanosis, or clubbing.  NEUROLOGIC: Cranial nerves II through XII are intact. Muscle strength 5/5 in all extremities. Sensation intact. Gait not checked.  PSYCHIATRIC: The patient is alert and oriented x 3.  SKIN: No obvious rash, lesion, or ulcer.    LABORATORY PANEL:   CBC Recent Labs  Lab 11/03/17 1801  WBC 9.6  HGB 11.8*  HCT 35.3*  PLT 88*   ------------------------------------------------------------------------------------------------------------------  Chemistries  Recent Labs  Lab 11/02/17 1408  11/03/17 1801 11/04/17 0750  NA 134*   < > 134* 137  K 3.6   < > 3.3* 4.0  CL 102   < > 103 104  CO2 21*   < > 20* 24  GLUCOSE 350*   < > 362* 258*  BUN 16   < > 20 23  CREATININE 1.28*   < > 1.37* 1.45*  CALCIUM 9.9   < > 9.1 9.4  MG  --   --  1.7  --   AST 67*  --   --   --   ALT 49*  --   --   --   ALKPHOS 98  --   --   --   BILITOT 1.5*  --   --   --    < > = values in this interval not displayed.   ------------------------------------------------------------------------------------------------------------------  Cardiac Enzymes Recent Labs  Lab 11/04/17 0408  TROPONINI 0.80*   ------------------------------------------------------------------------------------------------------------------  RADIOLOGY:  Dg Abd 1 View  Result Date: 11/03/2017 CLINICAL DATA:  72 year old female with increased pain after urologic stent placement. EXAM: ABDOMEN - 1 VIEW COMPARISON:  CT Abdomen and Pelvis 11/02/2017. FINDINGS: Two supine views at 1520 hours. Right double-J ureteral stent has been placed. The distal pigtail is appropriately located in the lower central pelvis. The proximal pigtail is partially on looped. The 10 millimeter right ureteropelvic  junction obstructing calculus seen by CT yesterday is not clearly identified. Numerous pelvic phleboliths redemonstrated. New moderate gaseous distension of the stomach. Normal bowel gas pattern otherwise. Stable cholecystectomy clips. No acute osseous abnormality identified. IMPRESSION: 1. Right double-J ureteral stent placed. The proximal pigtail of the catheter is partially un-coiled. The distal pigtail appears satisfactory. The 10 mm right UPJ calculus seen by CT yesterday is not clearly identified. 2. Moderate new gaseous distension of the stomach, but normal bowel gas pattern otherwise. Electronically Signed   By: Odessa Fleming M.D.   On: 11/03/2017 15:41   Dg Chest Port 1 View  Result Date: 11/03/2017 CLINICAL DATA:  72 year old female with increasing shortness of breath after urologic stent placement. Wheezing. EXAM: PORTABLE CHEST 1 VIEW COMPARISON:  CT Abdomen and Pelvis 11/02/2017. FINDINGS: The lung bases were clear on the CT yesterday. AP seated view today at 1515 hours. Stable mild cardiomegaly. Other mediastinal contours are within normal limits. Visualized tracheal air column is within normal limits. Borderline to mild increased pulmonary interstitial markings now. No pneumothorax or consolidation. Questionable small pleural effusions. Negative visible bowel gas pattern. No acute osseous abnormality identified. IMPRESSION: 1. Mild pulmonary interstitial markings and possibly new small pleural effusions suspicious for Acute Pulmonary Edema. 2. Stable mild cardiomegaly. Electronically Signed   By: Odessa Fleming M.D.   On: 11/03/2017 15:39   Ct Renal Stone Study  Result Date: 11/02/2017 CLINICAL DATA:  Flank pain EXAM: CT ABDOMEN AND PELVIS WITHOUT CONTRAST TECHNIQUE: Multidetector CT imaging of the abdomen and pelvis was performed following the standard protocol without IV contrast. COMPARISON:  Ultrasound 07/21/2017, radiograph 05/22/2017 FINDINGS: Lower chest: Lung bases demonstrate no acute  consolidation or effusion. The heart size is normal. Hepatobiliary: No focal liver abnormality is seen. There may be subtle contour nodularity of the surface of the liver. Status post cholecystectomy. No biliary dilatation. Pancreas: Unremarkable. No pancreatic ductal dilatation or surrounding inflammatory changes. Spleen: Normal in size without focal abnormality. Adrenals/Urinary Tract: Adrenal glands are normal. Moderate right perinephric stranding. Moderate right hydronephrosis, secondary to a 10 x 13 mm stone in the proximal right ureter at the UPJ. Additional 8 mm stone lower pole right kidney. 3 mm stone lower pole left kidney. Bladder unremarkable Stomach/Bowel: Stomach is within normal limits. Appendix appears normal. No evidence of bowel wall thickening, distention, or inflammatory changes. Left colon diverticular disease without acute inflammatory process Vascular/Lymphatic: Moderate aortic atherosclerosis. No aneurysm. No significantly enlarged lymph nodes. Reproductive: Status post hysterectomy.  3.1 cm right ovarian cyst. Other: Negative for free air or free fluid. Musculoskeletal: Mild scoliosis and degenerative change of the spine IMPRESSION: 1. Moderate right hydronephrosis, secondary to a 10 x 13 mm stone at the right UPJ. 2. Intrarenal calculi bilaterally 3. Left colon diverticular disease without acute inflammation 4. 3.1 cm right adnexal cyst. Recommend correlation with pelvic ultrasound which may be performed on a nonemergent basis. Electronically Signed   By: Jasmine Pang M.D.   On: 11/02/2017 14:34    EKG:   Orders placed or performed during the hospital encounter of 11/02/17  .  ED EKG  . ED EKG  . EKG 12-Lead  . EKG 12-Lead  . EKG 12-Lead  . EKG 12-Lead  . EKG 12-Lead  . EKG 12-Lead  . EKG 12-Lead  . EKG 12-Lead  . EKG 12-Lead  . EKG 12-Lead  . EKG 12-Lead  . EKG 12-Lead  . EKG 12-Lead  . EKG 12-Lead    ASSESSMENT AND PLAN:   #1 /right ureteral calculus with  right-sided hydronephrosis secondary to obstructing calculus: Status post emergency right ureteral stent by urology yesterday patient has been afebrile after the procedure Cultures are showing gram-negative rods likeLY, pending sensitivities.  Continue IV Rocephin for now.  #2. non-ST elevation MI, troponin still going up to 0.80, seen by cardiology Dr. ENT and, started on heparin drip, increase the Lasix to 40 mg twice daily, continue beta-blockers, possible underlying three-vessel CAD.  Heparin drip is okay with urology.  Continue high intensity statins.   #3. acute respiratory failure with hypoxia secondary to acute systolic heart failure EF 20% by echo, patient will be transferred to stepdown unit, right now she is 100% nonrebreather sats are around 90% but she is in obvious respiratory distress spoke with ICU attending, ICU charge nurse Maralyn Sago will transfer the patient to stepdown for BiPAP support   3 .  Diabetes mellitus type 2: Hold oral hypoglycemics as.  P.o. intake is poor today.  Sliding scale insulin with coverage.   #4 anxiety, depression: Patient is on Paxil.  #5. transient confusion received Haldol last night.  Patient told me that she does not want intubation or CPR.  Appreciate urology, cardiology following today. All the records are reviewed and case discussed with Care Management/Social Workerr. Management plans discussed with the patient, family and they are in agreement.  CODE STATUS: DNR TOTAL TIME TAKING CARE OF THIS PATIENT: Total time spent 60 minutes  in coordination of care  DISCussed with ICU attending, ICU charge nurse, cardiologist, patient nurse, respiratory therapist SPOKE with patient's son Toney Sang over the phone  Patient is too critical to plan for discharge disposition.Marland Kitchen HIGH RISK FOR Cardiopulmonary arrest.  Katha Hamming M.D on 11/04/2017 at 10:10 AM  Between 7am to 6pm - Pager - [phone]  After 6pm go to [email or web address] - password [email or web address]  [facility]  Office  [phone]  CC: Primary care physician; Corky Downs, MD   Note: This dictation was prepared with Dragon dictation along with smaller phrase technology. Any transcriptional errors that result from this process are unintentional.

## 2017-11-04 NOTE — Consult Note (Signed)
Name: Kristina Archer MRN: [id number] DOB: [birth year]     CONSULTATION DATE: 11/02/2017   HISTORY OF PRESENT ILLNESS:   72 years old lady with history of hypertension, diabetes mellitus, anxiety, dyslipidemia and GERD patient is admitted with obstructive uropathy UTI and was found to have stone at the right ureteropelvic junction.  She underwent Stone Extraction and placement of double-J right ureteral stent. She was noticed to have elevated troponin with underlying left bundle branch block coronary artery disease.  She has been evaluated by cardiology and was diagnosed with demand versus supply mismatch and was started on heparin, antiplatelet, high intensity statin and beta-blocker.  Patient did develop acute respiratory failure with increased FiO2 requirement, diffuse bronchospasm and pulmonary edema and had to be placed on a BiPAP.  CODE STATUS DNR. All history was obtained from the hospitalist service and EMR. Patient arrived to the intensive care unit on a BiPAP setting of 12/5 50%, heparin drip, denied chest pain, mild respiratory distress.  PAST MEDICAL HISTORY :   has a past medical history of Anxiety, Cardiomyopathy (HCC), DDD (degenerative disc disease), lumbar, Diverticulosis, GERD (gastroesophageal reflux disease), High blood pressure, High cholesterol, LBBB (left bundle branch block), Nephrolithiasis, Sciatica, and Type II diabetes mellitus (HCC).  has a past surgical history that includes Cholecystectomy; Abdominal hysterectomy; Cystoscopy/ureteroscopy/holmium laser/stent placement (Left, 05/24/2017); Cystoscopy w/ retrogrades (Bilateral, 05/24/2017); and Cystoscopy w/ ureteral stent placement (Right, 11/02/2017). Prior to Admission medications   Medication Sig Start Date End Date Taking? Authorizing Provider  amLODipine-benazepril (LOTREL) 5-10 MG capsule Take 1 capsule by mouth daily.   Yes [provider]  dexlansoprazole (DEXILANT) 60 MG capsule Take 60 mg by mouth  daily before breakfast.   Yes [provider]  docusate sodium (COLACE) 100 MG capsule Take 1 capsule (100 mg total) by mouth 2 (two) times daily. 05/24/17  Yes Vanna Scotland, MD  furosemide (LASIX) 20 MG tablet Take 20 mg by mouth daily.   Yes [provider]  gabapentin (NEURONTIN) 100 MG capsule Take 100 mg by mouth 3 (three) times daily as needed (back pain).   Yes [provider]  glimepiride (AMARYL) 2 MG tablet Take 2 mg by mouth daily with breakfast.   Yes [provider]  metFORMIN (GLUCOPHAGE-XR) 500 MG 24 hr tablet Take 500 mg by mouth 2 (two) times daily.   Yes [provider]  Multiple Vitamin (MULTIVITAMIN WITH MINERALS) TABS tablet Take 1 tablet by mouth daily.   Yes [provider]  PARoxetine (PAXIL) 20 MG tablet Take 20 mg by mouth at bedtime.   Yes [provider]  rosuvastatin (CRESTOR) 5 MG tablet Take 5 mg by mouth daily.  06/25/14  Yes [provider]  sitaGLIPtin (JANUVIA) 100 MG tablet Take 100 mg by mouth daily.   Yes [provider]  HYDROcodone-acetaminophen (NORCO/VICODIN) 5-325 MG tablet Take 1-2 tablets by mouth every 6 (six) hours as needed for moderate pain. Patient not taking: Reported on 11/02/2017 05/24/17   Vanna Scotland, MD  nitrofurantoin, macrocrystal-monohydrate, (MACROBID) 100 MG capsule Take 1 capsule (100 mg total) by mouth every 12 (twelve) hours. Patient not taking: Reported on 11/02/2017 06/12/17   Vanna Scotland, MD  oxybutynin (DITROPAN) 5 MG tablet Take 1 tablet (5 mg total) by mouth every 8 (eight) hours as needed for bladder spasms. Patient not taking: Reported on 11/02/2017 05/24/17   Vanna Scotland, MD  sulfamethoxazole-trimethoprim (BACTRIM DS,SEPTRA DS) 800-160 MG tablet Take 1 tablet by mouth every 12 (twelve) hours. Patient  not taking: Reported on 11/02/2017 06/09/17   Vanna Scotland, MD  tamsulosin (FLOMAX) 0.4 MG CAPS capsule Take 1 capsule (0.4 mg total) by  mouth daily. Patient not taking: Reported on 11/02/2017 05/24/17   Vanna Scotland, MD   Allergies  Allergen Reactions  . Codeine Nausea Only    FAMILY HISTORY:  family history includes Heart attack (age of onset: 12) in her father. SOCIAL HISTORY:  reports that she quit smoking about 20 years ago. Her smoking use included cigarettes. She has a 22.50 pack-year smoking history. She has never used smokeless tobacco. She reports that she drank alcohol. She reports that she does not use drugs.  REVIEW OF SYSTEMS:   Unable to obtain due to critical illness   VITAL SIGNS: Temp:  [98.3 F (36.8 C)-100.3 F (37.9 C)] 98.9 F (37.2 C) (10/26 1215) Pulse Rate:  [94-144] 94 (10/26 1300) Resp:  [17-35] 30 (10/26 1300) BP: (108-127)/(34-72) 115/63 (10/26 1300) SpO2:  [89 %-95 %] 95 % (10/26 1300) Weight:  [92.1 kg] 92.1 kg (10/26 0337)  Physical Examination:  Awake, restless, following commands and moving all extremities On a BiPAP 12/5 50%, mild respiratory distress with respiratory rate 28/min, bilateral equal air entry and diffuse expiratory wheezes S1 & S2 are audible with no murmur Benign abdominal exam with feeble peristalsis No leg edema   ASSESSMENT / PLAN:  Acute respiratory failure requiring BiPAP.  DNR -Monitor ABG, optimize BiPAP settings   Atelectasis and pneumonia.  Left lower airspace disease and pulmonary congestion with a small left pleural effusion -Rocephin.  MRSA PCR negative -Monitor CXR + CBC + FiO2  Acute exacerbation of COPD versus reactive airway disease -Optimize bronchodilators, tapering systemic steroids and antibiotics  UTI with obstructive uropathy status post the placement of double-J ureter on the right.  Urine culture grew E. coli -Rocephin  AKI with ATN and sepsis -Optimize hydration, avoid nephrotoxins, monitor renal panel and urine output   *Elevated troponin was demand versus supply mismatch and baseline coronary artery disease and left  bundle branch.   *HFrEF was pulmonary congestion.LVEF 20 to 25% with diffuse hypokinesis -Heparin drip + aspirin + beta-blocker + statin -Diuresis to improve lung compliance -Management as per cardiology and plan for invasive ischemia work-up.  Hypertension -Optimize antihypertensives and monitor  Diabetes mellitus -Glycemic control  Anxiety and restlessness on the BiPAP -Optimize benzodiazepine and consider Precedex.  Anemia -Maintain hemoglobin more than 8 g/dL  Thrombocytopenia -Monitor platelet counts  DNR  DVT & GI prophylaxis.  Continue supportive care  Critical care time 45 minutes

## 2017-11-04 NOTE — Progress Notes (Signed)
Patient's oxygen saturation levels decreased to 85%-98% on 6-7L of O2. Breathing labored. Non-rebreather placed with respiratory at bedside. Dr. Luberta Mutter aware. Oxygen saturation at 94% on non-rebreather. Will continue to monitor patient.

## 2017-11-05 ENCOUNTER — Inpatient Hospital Stay: Payer: Medicare HMO

## 2017-11-05 DIAGNOSIS — R000 Tachycardia, unspecified: Secondary | ICD-10-CM

## 2017-11-05 DIAGNOSIS — N179 Acute kidney failure, unspecified: Secondary | ICD-10-CM

## 2017-11-05 LAB — BASIC METABOLIC PANEL
ANION GAP: 9 (ref 5–15)
BUN: 41 mg/dL — ABNORMAL HIGH (ref 8–23)
CALCIUM: 9.7 mg/dL (ref 8.9–10.3)
CO2: 25 mmol/L (ref 22–32)
CREATININE: 1.2 mg/dL — AB (ref 0.44–1.00)
Chloride: 102 mmol/L (ref 98–111)
GFR calc Af Amer: 52 mL/min — ABNORMAL LOW (ref >60)
GFR, EST NON AFRICAN AMERICAN: 45 mL/min — AB (ref >60)
GLUCOSE: 453 mg/dL — AB (ref 70–99)
Potassium: 3.8 mmol/L (ref 3.5–5.1)
Sodium: 136 mmol/L (ref 135–145)

## 2017-11-05 LAB — GLUCOSE, CAPILLARY
GLUCOSE-CAPILLARY: 430 mg/dL — AB (ref 70–99)
Glucose-Capillary: 338 mg/dL — ABNORMAL HIGH (ref 70–99)
Glucose-Capillary: 409 mg/dL — ABNORMAL HIGH (ref 70–99)
Glucose-Capillary: 398 mg/dL — ABNORMAL HIGH (ref 70–99)

## 2017-11-05 LAB — CBC
HCT: 31.2 % — ABNORMAL LOW (ref 36.0–46.0)
Hemoglobin: 10.4 g/dL — ABNORMAL LOW (ref 12.0–15.0)
MCH: 28.5 pg (ref 26.0–34.0)
MCHC: 33.3 g/dL (ref 30.0–36.0)
MCV: 85.5 fL (ref 80.0–100.0)
Platelets: 86 K/uL — ABNORMAL LOW (ref 150–400)
RBC: 3.65 MIL/uL — ABNORMAL LOW (ref 3.87–5.11)
RDW: 14.4 % (ref 11.5–15.5)
WBC: 5.6 K/uL (ref 4.0–10.5)
nRBC: 0 % (ref 0.0–0.2)

## 2017-11-05 LAB — HEPARIN LEVEL (UNFRACTIONATED)
Heparin Unfractionated: 0.2 [IU]/mL — ABNORMAL LOW (ref 0.30–0.70)
Heparin Unfractionated: 0.2 [IU]/mL — ABNORMAL LOW (ref 0.30–0.70)

## 2017-11-05 LAB — TROPONIN I: Troponin I: 0.66 ng/mL

## 2017-11-05 LAB — URINE CULTURE

## 2017-11-05 LAB — BASIC METABOLIC PANEL WITH GFR
Anion gap: 12 (ref 5–15)
BUN: 36 mg/dL — ABNORMAL HIGH (ref 8–23)
CO2: 22 mmol/L (ref 22–32)
Calcium: 9.5 mg/dL (ref 8.9–10.3)
Chloride: 102 mmol/L (ref 98–111)
Creatinine, Ser: 1.24 mg/dL — ABNORMAL HIGH (ref 0.44–1.00)
GFR calc Af Amer: 50 mL/min — ABNORMAL LOW
GFR calc non Af Amer: 43 mL/min — ABNORMAL LOW
Glucose, Bld: 379 mg/dL — ABNORMAL HIGH (ref 70–99)
Potassium: 3.8 mmol/L (ref 3.5–5.1)
Sodium: 136 mmol/L (ref 135–145)

## 2017-11-05 LAB — MAGNESIUM
Magnesium: 1.9 mg/dL (ref 1.7–2.4)
Magnesium: 2.1 mg/dL (ref 1.7–2.4)

## 2017-11-05 LAB — PHOSPHORUS: Phosphorus: 2.8 mg/dL (ref 2.5–4.6)

## 2017-11-05 MED ORDER — INSULIN ASPART 100 UNIT/ML ~~LOC~~ SOLN
14.0000 [IU] | Freq: Once | SUBCUTANEOUS | Status: AC
  Administered 2017-11-06: 14 [IU] via SUBCUTANEOUS
  Filled 2017-11-05: qty 1

## 2017-11-05 MED ORDER — INSULIN ASPART 100 UNIT/ML ~~LOC~~ SOLN
0.0000 [IU] | SUBCUTANEOUS | Status: DC
  Administered 2017-11-05: 15 [IU] via SUBCUTANEOUS
  Administered 2017-11-06: 11 [IU] via SUBCUTANEOUS
  Administered 2017-11-06: 8 [IU] via SUBCUTANEOUS
  Administered 2017-11-06: 5 [IU] via SUBCUTANEOUS
  Administered 2017-11-06: 15 [IU] via SUBCUTANEOUS
  Administered 2017-11-06: 11 [IU] via SUBCUTANEOUS
  Administered 2017-11-07 (×2): 3 [IU] via SUBCUTANEOUS
  Administered 2017-11-07 (×2): 5 [IU] via SUBCUTANEOUS
  Administered 2017-11-07: 2 [IU] via SUBCUTANEOUS
  Administered 2017-11-07: 5 [IU] via SUBCUTANEOUS
  Administered 2017-11-08: 2 [IU] via SUBCUTANEOUS
  Filled 2017-11-05 (×13): qty 1

## 2017-11-05 MED ORDER — HEPARIN BOLUS VIA INFUSION
1100.0000 [IU] | Freq: Once | INTRAVENOUS | Status: AC
  Administered 2017-11-05: 1100 [IU] via INTRAVENOUS
  Filled 2017-11-05: qty 1100

## 2017-11-05 MED ORDER — AMIODARONE HCL IN DEXTROSE 360-4.14 MG/200ML-% IV SOLN
INTRAVENOUS | Status: AC
  Administered 2017-11-05: 60 mg/h via INTRAVENOUS
  Filled 2017-11-05: qty 200

## 2017-11-05 MED ORDER — AMIODARONE HCL IN DEXTROSE 360-4.14 MG/200ML-% IV SOLN
60.0000 mg/h | INTRAVENOUS | Status: AC
  Administered 2017-11-05: 60 mg/h via INTRAVENOUS

## 2017-11-05 MED ORDER — POLYETHYLENE GLYCOL 3350 17 G PO PACK
17.0000 g | PACK | Freq: Every day | ORAL | Status: DC
  Administered 2017-11-05 – 2017-11-13 (×5): 17 g via ORAL
  Filled 2017-11-05 (×8): qty 1

## 2017-11-05 MED ORDER — FUROSEMIDE 10 MG/ML IJ SOLN
40.0000 mg | Freq: Two times a day (BID) | INTRAMUSCULAR | Status: DC
  Administered 2017-11-05 – 2017-11-07 (×6): 40 mg via INTRAVENOUS
  Filled 2017-11-05 (×6): qty 4

## 2017-11-05 MED ORDER — INSULIN ASPART 100 UNIT/ML ~~LOC~~ SOLN
10.0000 [IU] | Freq: Once | SUBCUTANEOUS | Status: AC
  Administered 2017-11-05: 10 [IU] via SUBCUTANEOUS
  Filled 2017-11-05: qty 1

## 2017-11-05 MED ORDER — AMIODARONE HCL IN DEXTROSE 360-4.14 MG/200ML-% IV SOLN
30.0000 mg/h | INTRAVENOUS | Status: DC
  Administered 2017-11-05 – 2017-11-07 (×4): 30 mg/h via INTRAVENOUS
  Filled 2017-11-05 (×4): qty 200

## 2017-11-05 MED ORDER — SODIUM CHLORIDE 0.9 % IV SOLN
1.0000 g | INTRAVENOUS | Status: DC
  Filled 2017-11-05 (×3): qty 1

## 2017-11-05 NOTE — Progress Notes (Signed)
Progress Note  Patient Name: Kristina Archer Date of Encounter: 11/05/2017  Primary Cardiologist: Julien Nordmann, MD  Subjective   Patient reports feeling better today with less shortness of breath.  No chest pain.  She was transferred to stepdown for BiPAP followed by high flow nasal cannula due to hypoxia and increased work of breathing on the floor yesterday despite being on 6 L of oxygen via nasal cannula.  Inpatient Medications    Scheduled Meds: . aspirin  81 mg Oral Daily  . carvedilol  3.125 mg Oral BID WC  . docusate sodium  100 mg Oral BID  . furosemide  40 mg Intravenous BID  . haloperidol lactate  5 mg Intravenous Once  . insulin aspart  0-9 Units Subcutaneous TID WC  . ipratropium-albuterol  3 mL Nebulization Q6H  . linagliptin  5 mg Oral Daily  . mouth rinse  15 mL Mouth Rinse BID  . methylPREDNISolone (SOLU-MEDROL) injection  40 mg Intravenous Q8H  . pantoprazole  40 mg Oral Daily  . PARoxetine  20 mg Oral QHS  . potassium chloride  20 mEq Oral Daily  . rosuvastatin  40 mg Oral Daily   Continuous Infusions: . cefTRIAXone (ROCEPHIN)  IV 1 g (11/04/17 1540)  . heparin 850 Units/hr (11/04/17 2000)   PRN Meds: acetaminophen **OR** acetaminophen, bisacodyl, levalbuterol, LORazepam, ondansetron **OR** ondansetron (ZOFRAN) IV, traZODone   Vital Signs    Vitals:   11/05/17 0200 11/05/17 0300 11/05/17 0400 11/05/17 0500  BP: (!) 101/54 (!) 115/56 (!) 109/56 (!) 108/51  Pulse: 78 86 82 81  Resp: 18 17 18 17   Temp: 98.3 F (36.8 C)  98 F (36.7 C)   TempSrc: Oral  Oral   SpO2: 97% 97% 98% 95%  Weight:    86 kg  Height:        Intake/Output Summary (Last 24 hours) at 11/05/2017 0834 Last data filed at 11/05/2017 0500 Gross per 24 hour  Intake 475 ml  Output 2475 ml  Net -2000 ml   Filed Weights   11/03/17 0500 11/04/17 0337 11/05/17 0500  Weight: 91 kg 92.1 kg 86 kg    Telemetry    Sinus rhythm and sinus tachycardia with frequent PVCs versus  PACs in the setting of LBBB.  Short runs of NSVT versus SVT with aberrancy noted. - Personally Reviewed  ECG    No new tracing.  Physical Exam   GEN: No acute distress.   Neck:  JVP difficult to assess but likely slightly elevated. Cardiac:  Distant heart sounds.  Regular rate and rhythm without murmurs. Respiratory:  Improved air movement compared to yesterday though breath sounds still slightly diminished at the bases.  No wheezes or crackles. GI: Soft, nontender, non-distended  MS:  Trace pretibial edema bilaterally; No deformity. Neuro:  Nonfocal  Psych: Somnolent but easily arousable.  Labs    Chemistry Recent Labs  Lab 11/02/17 1408  11/03/17 1801 11/04/17 0750 11/04/17 2315  NA 134*   < > 134* 137 137  K 3.6   < > 3.3* 4.0 3.9  CL 102   < > 103 104 104  CO2 21*   < > 20* 24 25  GLUCOSE 350*   < > 362* 258* 349*  BUN 16   < > 20 23 30*  CREATININE 1.28*   < > 1.37* 1.45* 1.36*  CALCIUM 9.9   < > 9.1 9.4 9.4  PROT 7.4  --   --   --   --  ALBUMIN 4.0  --   --   --   --   AST 67*  --   --   --   --   ALT 49*  --   --   --   --   ALKPHOS 98  --   --   --   --   BILITOT 1.5*  --   --   --   --   GFRNONAA 42*   < > 38* 36* 39*  GFRAA 48*   < > 44* 42* 45*  ANIONGAP 11   < > 11 9 8    < > = values in this interval not displayed.     Hematology Recent Labs  Lab 11/03/17 1801 11/04/17 1047 11/04/17 2315  WBC 9.6 9.2 5.5  RBC 4.21 4.07 3.73*  HGB 11.8* 11.4* 10.4*  HCT 35.3* 34.4* 31.5*  MCV 83.8 84.5 84.5  MCH 28.0 28.0 27.9  MCHC 33.4 33.1 33.0  RDW 14.2 14.2 14.3  PLT 88* 99* 98*    Cardiac Enzymes Recent Labs  Lab 11/03/17 0327 11/03/17 1801 11/03/17 2315 11/04/17 0408  TROPONINI 0.29* 0.27* 0.71* 0.80*   No results for input(s): TROPIPOC in the last 168 hours.   BNP Recent Labs  Lab 11/03/17 1801  BNP 862.0*     DDimer No results for input(s): DDIMER in the last 168 hours.   Radiology    Dg Abd 1 View  Result Date:  11/03/2017 CLINICAL DATA:  72 year old female with increased pain after urologic stent placement. EXAM: ABDOMEN - 1 VIEW COMPARISON:  CT Abdomen and Pelvis 11/02/2017. FINDINGS: Two supine views at 1520 hours. Right double-J ureteral stent has been placed. The distal pigtail is appropriately located in the lower central pelvis. The proximal pigtail is partially on looped. The 10 millimeter right ureteropelvic junction obstructing calculus seen by CT yesterday is not clearly identified. Numerous pelvic phleboliths redemonstrated. New moderate gaseous distension of the stomach. Normal bowel gas pattern otherwise. Stable cholecystectomy clips. No acute osseous abnormality identified. IMPRESSION: 1. Right double-J ureteral stent placed. The proximal pigtail of the catheter is partially un-coiled. The distal pigtail appears satisfactory. The 10 mm right UPJ calculus seen by CT yesterday is not clearly identified. 2. Moderate new gaseous distension of the stomach, but normal bowel gas pattern otherwise. Electronically Signed   By: Odessa Fleming M.D.   On: 11/03/2017 15:41   Dg Chest Port 1 View  Result Date: 11/03/2017 CLINICAL DATA:  72 year old female with increasing shortness of breath after urologic stent placement. Wheezing. EXAM: PORTABLE CHEST 1 VIEW COMPARISON:  CT Abdomen and Pelvis 11/02/2017. FINDINGS: The lung bases were clear on the CT yesterday. AP seated view today at 1515 hours. Stable mild cardiomegaly. Other mediastinal contours are within normal limits. Visualized tracheal air column is within normal limits. Borderline to mild increased pulmonary interstitial markings now. No pneumothorax or consolidation. Questionable small pleural effusions. Negative visible bowel gas pattern. No acute osseous abnormality identified. IMPRESSION: 1. Mild pulmonary interstitial markings and possibly new small pleural effusions suspicious for Acute Pulmonary Edema. 2. Stable mild cardiomegaly. Electronically Signed   By:  Odessa Fleming M.D.   On: 11/03/2017 15:39    Cardiac Studies   Echocardiogram (11/03/17): - Left ventricle: The cavity size was mildly dilated. Systolic function was severely reduced. The estimated ejection fraction was in the range of 20% to 25%. Diffuse hypokinesis. Regional wall motion abnormalities cannot be excluded. The study is not technically sufficient to allow evaluation of  LV diastolic function. - Mitral valve: There was mild regurgitation. - Left atrium: The atrium was normal in size. - Right ventricle: Systolic function was normal. - Pulmonary arteries: Systolic pressure could not be accurately estimated.  Patient Profile     72 y.o. female with history of HTN, HL, DMII, remote tob abuse, GERD, DDD, obesity, and anxiety, admitted with ureteral stone, complicated by acute systolic heart failure and elevated troponin.  Assessment & Plan    Acute respiratory failure with hypoxia and acute systolic heart failure Patient improved compared with yesterday following transfer to stepdown for BiPAP/HF [place].  She was also diuresed more aggressively and was net negative about a liter over 24 hours.  Volume status has improved clinically, though I suspect she still has some underlying fluid retention.  Patient also being treated for possible component of COPD exacerbation.  Continue furosemide 40 mg IV twice daily for net negative fluid balance of 1 to 2 L per 24 hours.  Continue COPD treatment per medicine and critical care.  Wean oxygen, as tolerated.  Continue carvedilol 3.125 mg twice daily.  Given soft blood pressure, I will defer adding an ACE inhibitor or ARB now, though this should be considered as blood pressure allows.  Demand ischemia No chest pain.  Shortness of breath improving.  I suspect the patient has underlying CAD that is contributing to her cardiomyopathy.  Follow-up troponin this morning.  Complete at least 48 hours of heparin.  Would favor left and  right heart catheterization during this admission, once respiratory status has improved and renal function stabilized.  Continue aspirin, carvedilol, and rosuvastatin.  Acute kidney injury Slight bump in creatinine noted from baseline during this admission (0.9-1 -> 1.5).  Suspect this is likely due to several factors, including acute decompensated heart failure complicated by recent ureteral stone.  Follow-up BMP today.  Continue diuresis, as above.  Avoid nephrotoxic agents.  Ureteral stone Asymptomatic at this time following ureteral stent placement by urology.  Continued management per urology and internal medicine.  For questions or updates, please contact [facility] Please consult [email or web address] for contact info under [facility].  Signed, Yvonne Kendall, MD  11/05/2017, 8:34 AM

## 2017-11-05 NOTE — Consult Note (Signed)
ANTICOAGULATION CONSULT NOTE - Initial Consult  Pharmacy Consult for heparin infusion Indication: chest pain/ACS  Allergies  Allergen Reactions  . Codeine Nausea Only    Patient Measurements: Height: 5\' 3"  (160 cm) Weight: 203 lb 0.7 oz (92.1 kg) IBW/kg (Calculated) : 52.4 Heparin Dosing Weight: 71.7  Vital Signs: Temp: 98.5 F (36.9 C) (10/26 2000) Temp Source: Oral (10/26 2000) BP: 100/53 (10/26 2200) Pulse Rate: 80 (10/26 2200)  Labs: Recent Labs    11/03/17 1801 11/03/17 2315 11/04/17 0408 11/04/17 0750 11/04/17 1047 11/04/17 1720 11/04/17 2315  HGB 11.8*  --   --   --  11.4*  --  10.4*  HCT 35.3*  --   --   --  34.4*  --  31.5*  PLT 88*  --   --   --  99*  --  98*  APTT  --   --   --   --  42*  --   --   LABPROT  --   --   --   --  16.0*  --   --   INR  --   --   --   --  1.29  --   --   HEPARINUNFRC  --   --   --   --   --  0.39 0.37  CREATININE 1.37*  --   --  1.45*  --   --  1.36*  TROPONINI 0.27* 0.71* 0.80*  --   --   --   --     Estimated Creatinine Clearance: 42.1 mL/min (A) (by C-G formula based on SCr of 1.36 mg/dL (H)).   Medical History: Past Medical History:  Diagnosis Date  . Anxiety   . Cardiomyopathy (HCC)    a. 10/2017 Echo: EF 20-25%, diff HK, mild MR. Nl RV size.  . DDD (degenerative disc disease), lumbar   . Diverticulosis   . GERD (gastroesophageal reflux disease)   . High blood pressure   . High cholesterol   . LBBB (left bundle branch block)   . Nephrolithiasis   . Sciatica   . Type II diabetes mellitus (HCC)     Medications:  Medications Prior to Admission  Medication Sig Dispense Refill Last Dose  . amLODipine-benazepril (LOTREL) 5-10 MG capsule Take 1 capsule by mouth daily.   11/01/2017 at 0800  . dexlansoprazole (DEXILANT) 60 MG capsule Take 60 mg by mouth daily before breakfast.   11/01/2017 at 0700  . docusate sodium (COLACE) 100 MG capsule Take 1 capsule (100 mg total) by mouth 2 (two) times daily. 60 capsule 0  Unknown at PRN  . furosemide (LASIX) 20 MG tablet Take 20 mg by mouth daily.   11/01/2017 at 0800  . gabapentin (NEURONTIN) 100 MG capsule Take 100 mg by mouth 3 (three) times daily as needed (back pain).   Unknown at PRN  . glimepiride (AMARYL) 2 MG tablet Take 2 mg by mouth daily with breakfast.   11/01/2017 at 0800  . metFORMIN (GLUCOPHAGE-XR) 500 MG 24 hr tablet Take 500 mg by mouth 2 (two) times daily.   11/01/2017 at 1800  . Multiple Vitamin (MULTIVITAMIN WITH MINERALS) TABS tablet Take 1 tablet by mouth daily.   11/01/2017 at 0800  . PARoxetine (PAXIL) 20 MG tablet Take 20 mg by mouth at bedtime.   11/01/2017 at 2000  . rosuvastatin (CRESTOR) 5 MG tablet Take 5 mg by mouth daily.    11/01/2017 at 0800  . sitaGLIPtin (JANUVIA) 100 MG tablet  Take 100 mg by mouth daily.   11/01/2017 at 0800  . HYDROcodone-acetaminophen (NORCO/VICODIN) 5-325 MG tablet Take 1-2 tablets by mouth every 6 (six) hours as needed for moderate pain. (Patient not taking: Reported on 11/02/2017) 10 tablet 0 Not Taking at Unknown time  . nitrofurantoin, macrocrystal-monohydrate, (MACROBID) 100 MG capsule Take 1 capsule (100 mg total) by mouth every 12 (twelve) hours. (Patient not taking: Reported on 11/02/2017) 20 capsule 0 Not Taking at Unknown time  . oxybutynin (DITROPAN) 5 MG tablet Take 1 tablet (5 mg total) by mouth every 8 (eight) hours as needed for bladder spasms. (Patient not taking: Reported on 11/02/2017) 30 tablet 0 Not Taking at Unknown time  . sulfamethoxazole-trimethoprim (BACTRIM DS,SEPTRA DS) 800-160 MG tablet Take 1 tablet by mouth every 12 (twelve) hours. (Patient not taking: Reported on 11/02/2017) 14 tablet 0 Not Taking at Unknown time  . tamsulosin (FLOMAX) 0.4 MG CAPS capsule Take 1 capsule (0.4 mg total) by mouth daily. (Patient not taking: Reported on 11/02/2017) 30 capsule 0 Not Taking at Unknown time    Assessment: 72 y.o. female with history of HTN, HL, DMII, remote tob abuse, GERD, DDD,  obesity, and anxiety, admitted with ureteral stone, complicated by acute systolic heart failure and elevated troponin.  Goal of Therapy:  Heparin level 0.3-0.7 units/ml Monitor platelets by anticoagulation protocol: Yes   Plan:  10/26 @ 2300 HL 0.37 therapeutic. Will continue rate of 850 units/hr and will recheck HL @ 0700. hgb trending down, will continue to monitor.  Thomasene Ripple, PharmD, BCPS Clinical Pharmacist 11/05/2017

## 2017-11-05 NOTE — Progress Notes (Signed)
[facility]  Date: 11/05/17 Time: 12:39 PM  I was contacted by the patient's RN regarding development of irregular tachycardia.  Telemetry shows onset of a-fib with RVR and baseline LBBB around 10:18 this AM.  EKG confirms a-fib with RVR and LBBB.  Patient notes palpitations but is otherwise stable without chest pain or shortness of breath.  Will start amiodarone infusion and continue heparin.  Will repeat BMP and Mg level at 1500 today, given ongoing diuresis.  If possible bronchodilators and steroids should be minimized.  We will continue current dose of carvedilol 3.125 mg BID.  Yvonne Kendall, MD [facility] Pager: [phone]

## 2017-11-05 NOTE — Progress Notes (Signed)
Name: Kristina Archer MRN: [id number] DOB: 06/10/1945     CONSULTATION DATE: 11/02/2017  Subjective & objectives: Tolerating high flow nasal cannula 45% in no distress  PAST MEDICAL HISTORY :   has a past medical history of Anxiety, Cardiomyopathy (HCC), DDD (degenerative disc disease), lumbar, Diverticulosis, GERD (gastroesophageal reflux disease), High blood pressure, High cholesterol, LBBB (left bundle branch block), Nephrolithiasis, Sciatica, and Type II diabetes mellitus (HCC).  has a past surgical history that includes Cholecystectomy; Abdominal hysterectomy; Cystoscopy/ureteroscopy/holmium laser/stent placement (Left, 05/24/2017); Cystoscopy w/ retrogrades (Bilateral, 05/24/2017); and Cystoscopy w/ ureteral stent placement (Right, 11/02/2017). Prior to Admission medications   Medication Sig Start Date End Date Taking? Authorizing Provider  amLODipine-benazepril (LOTREL) 5-10 MG capsule Take 1 capsule by mouth daily.   Yes [provider]  dexlansoprazole (DEXILANT) 60 MG capsule Take 60 mg by mouth daily before breakfast.   Yes [provider]  docusate sodium (COLACE) 100 MG capsule Take 1 capsule (100 mg total) by mouth 2 (two) times daily. 05/24/17  Yes Vanna Scotland, MD  furosemide (LASIX) 20 MG tablet Take 20 mg by mouth daily.   Yes [provider]  gabapentin (NEURONTIN) 100 MG capsule Take 100 mg by mouth 3 (three) times daily as needed (back pain).   Yes [provider]  glimepiride (AMARYL) 2 MG tablet Take 2 mg by mouth daily with breakfast.   Yes [provider]  metFORMIN (GLUCOPHAGE-XR) 500 MG 24 hr tablet Take 500 mg by mouth 2 (two) times daily.   Yes [provider]  Multiple Vitamin (MULTIVITAMIN WITH MINERALS) TABS tablet Take 1 tablet by mouth daily.   Yes [provider]  PARoxetine (PAXIL) 20 MG tablet Take 20 mg by mouth at bedtime.   Yes [provider]  rosuvastatin (CRESTOR) 5 MG tablet  Take 5 mg by mouth daily.  06/25/14  Yes [provider]  sitaGLIPtin (JANUVIA) 100 MG tablet Take 100 mg by mouth daily.   Yes [provider]  HYDROcodone-acetaminophen (NORCO/VICODIN) 5-325 MG tablet Take 1-2 tablets by mouth every 6 (six) hours as needed for moderate pain. Patient not taking: Reported on 11/02/2017 05/24/17   Vanna Scotland, MD  nitrofurantoin, macrocrystal-monohydrate, (MACROBID) 100 MG capsule Take 1 capsule (100 mg total) by mouth every 12 (twelve) hours. Patient not taking: Reported on 11/02/2017 06/12/17   Vanna Scotland, MD  oxybutynin (DITROPAN) 5 MG tablet Take 1 tablet (5 mg total) by mouth every 8 (eight) hours as needed for bladder spasms. Patient not taking: Reported on 11/02/2017 05/24/17   Vanna Scotland, MD  sulfamethoxazole-trimethoprim (BACTRIM DS,SEPTRA DS) 800-160 MG tablet Take 1 tablet by mouth every 12 (twelve) hours. Patient not taking: Reported on 11/02/2017 06/09/17   Vanna Scotland, MD  tamsulosin (FLOMAX) 0.4 MG CAPS capsule Take 1 capsule (0.4 mg total) by mouth daily. Patient not taking: Reported on 11/02/2017 05/24/17   Vanna Scotland, MD   Allergies  Allergen Reactions  . Codeine Nausea Only    FAMILY HISTORY:  family history includes Heart attack (age of onset: 40) in her father. SOCIAL HISTORY:  reports that she quit smoking about 20 years ago. Her smoking use included cigarettes. She has a 22.50 pack-year smoking history. She has never used smokeless tobacco. She reports that she drank alcohol. She reports that she does not use drugs.  REVIEW OF SYSTEMS:   Unable to obtain due to critical illness   VITAL SIGNS: Temp:  [98 F (36.7 C)-98.7 F (37.1 C)] 98 F (36.7  C) (10/27 0400) Pulse Rate:  [77-98] 81 (10/27 0500) Resp:  [17-35] 17 (10/27 0500) BP: (95-118)/(47-63) 108/51 (10/27 0500) SpO2:  [92 %-98 %] 95 % (10/27 0500) FiO2 (%):  [70 %] 70 % (10/26 2016) Weight:  [86 kg] 86 kg (10/27 0500)  Physical  Examination:  Awake, oriented and no focal neurological deficits On high flow nasal cannula 45%, no respiratory distress, able to talk in full sentences bilateral equal air entry and no wheezes S1 & S2 are audible with no murmur Benign abdominal exam with feeble peristalsis No leg edema   ASSESSMENT / PLAN:  Acute respiratory failure.  Improved and tolerating high flow nasal cannula 45% -Monitor work of breathing and ABG.  Atelectasis and pneumonia.    Improved left lower airspace disease and pulmonary congestion with a small left pleural effusion -Rocephin.  MRSA PCR negative -Monitor CXR + CBC + FiO2  Acute exacerbation of COPD versus reactive airway disease -Optimize bronchodilators, tapering systemic steroids and antibiotics  UTI with obstructive uropathy status post the placement of double-J ureter on the right.  Urine culture grew E. coli -Rocephin  AKI with ATN and sepsis (improved) -Optimize hydration, avoid nephrotoxins, monitor renal panel and urine output  *Elevated troponin was demand versus supply mismatch and baseline coronary artery disease and left bundle branch.   *HFrEF was pulmonary congestion.LVEF 20 to 25% with diffuse hypokinesis -Heparin drip + aspirin + beta-blocker + statin -Diuresis to improve lung compliance -Management as per cardiology and plan for invasive ischemia work-up.  Hypertension -Optimize antihypertensives and monitor  Diabetes mellitus -Glycemic control and taper systemic steroids  Anxiety and restlessness on the BiPAP -Optimize benzodiazepine and consider Precedex.  Anemia -Maintain hemoglobin more than 8 g/dL  Thrombocytopenia (worsening) -Monitor platelet counts  DNR  DVT & GI prophylaxis.  Continue supportive care  Critical care time 45 minutes

## 2017-11-05 NOTE — Consult Note (Signed)
ANTICOAGULATION CONSULT NOTE   Pharmacy Consult for heparin infusion Indication: chest pain/ACS  Allergies  Allergen Reactions  . Codeine Nausea Only    Patient Measurements: Height: 5\' 3"  (160 cm) Weight: 189 lb 9.5 oz (86 kg) IBW/kg (Calculated) : 52.4 Heparin Dosing Weight: 71.7  Vital Signs: Temp: 98.1 F (36.7 C) (10/27 1200) Temp Source: Oral (10/27 1200) BP: 111/57 (10/27 1200) Pulse Rate: 138 (10/27 1200)  Labs: Recent Labs    11/03/17 2315 11/04/17 0408  11/04/17 1047  11/04/17 2315 11/05/17 0728 11/05/17 1552  HGB  --   --   --  11.4*  --  10.4* 10.4*  --   HCT  --   --   --  34.4*  --  31.5* 31.2*  --   PLT  --   --   --  99*  --  98* 86*  --   APTT  --   --   --  42*  --   --   --   --   LABPROT  --   --   --  16.0*  --   --   --   --   INR  --   --   --  1.29  --   --   --   --   HEPARINUNFRC  --   --   --   --    < > 0.37 0.20* 0.20*  CREATININE  --   --    < >  --   --  1.36* 1.24* 1.20*  TROPONINI 0.71* 0.80*  --   --   --   --  0.66*  --    < > = values in this interval not displayed.    Estimated Creatinine Clearance: 46 mL/min (A) (by C-G formula based on SCr of 1.2 mg/dL (H)).   Medical History: Past Medical History:  Diagnosis Date  . Anxiety   . Cardiomyopathy (HCC)    a. 10/2017 Echo: EF 20-25%, diff HK, mild MR. Nl RV size.  . DDD (degenerative disc disease), lumbar   . Diverticulosis   . GERD (gastroesophageal reflux disease)   . High blood pressure   . High cholesterol   . LBBB (left bundle branch block)   . Nephrolithiasis   . Sciatica   . Type II diabetes mellitus (HCC)     Medications:  Medications Prior to Admission  Medication Sig Dispense Refill Last Dose  . amLODipine-benazepril (LOTREL) 5-10 MG capsule Take 1 capsule by mouth daily.   11/01/2017 at 0800  . dexlansoprazole (DEXILANT) 60 MG capsule Take 60 mg by mouth daily before breakfast.   11/01/2017 at 0700  . docusate sodium (COLACE) 100 MG capsule Take 1  capsule (100 mg total) by mouth 2 (two) times daily. 60 capsule 0 Unknown at PRN  . furosemide (LASIX) 20 MG tablet Take 20 mg by mouth daily.   11/01/2017 at 0800  . gabapentin (NEURONTIN) 100 MG capsule Take 100 mg by mouth 3 (three) times daily as needed (back pain).   Unknown at PRN  . glimepiride (AMARYL) 2 MG tablet Take 2 mg by mouth daily with breakfast.   11/01/2017 at 0800  . metFORMIN (GLUCOPHAGE-XR) 500 MG 24 hr tablet Take 500 mg by mouth 2 (two) times daily.   11/01/2017 at 1800  . Multiple Vitamin (MULTIVITAMIN WITH MINERALS) TABS tablet Take 1 tablet by mouth daily.   11/01/2017 at 0800  . PARoxetine (PAXIL) 20 MG tablet Take 20  mg by mouth at bedtime.   11/01/2017 at 2000  . rosuvastatin (CRESTOR) 5 MG tablet Take 5 mg by mouth daily.    11/01/2017 at 0800  . sitaGLIPtin (JANUVIA) 100 MG tablet Take 100 mg by mouth daily.   11/01/2017 at 0800  . HYDROcodone-acetaminophen (NORCO/VICODIN) 5-325 MG tablet Take 1-2 tablets by mouth every 6 (six) hours as needed for moderate pain. (Patient not taking: Reported on 11/02/2017) 10 tablet 0 Not Taking at Unknown time  . nitrofurantoin, macrocrystal-monohydrate, (MACROBID) 100 MG capsule Take 1 capsule (100 mg total) by mouth every 12 (twelve) hours. (Patient not taking: Reported on 11/02/2017) 20 capsule 0 Not Taking at Unknown time  . oxybutynin (DITROPAN) 5 MG tablet Take 1 tablet (5 mg total) by mouth every 8 (eight) hours as needed for bladder spasms. (Patient not taking: Reported on 11/02/2017) 30 tablet 0 Not Taking at Unknown time  . sulfamethoxazole-trimethoprim (BACTRIM DS,SEPTRA DS) 800-160 MG tablet Take 1 tablet by mouth every 12 (twelve) hours. (Patient not taking: Reported on 11/02/2017) 14 tablet 0 Not Taking at Unknown time  . tamsulosin (FLOMAX) 0.4 MG CAPS capsule Take 1 capsule (0.4 mg total) by mouth daily. (Patient not taking: Reported on 11/02/2017) 30 capsule 0 Not Taking at Unknown time    Assessment: 72 y.o. female  with history of HTN, HL, DMII, remote tob abuse, GERD, DDD, obesity, and anxiety, admitted with ureteral stone, complicated by acute systolic heart failure and elevated troponin.  Goal of Therapy:  Heparin level 0.3-0.7 units/ml Monitor platelets by anticoagulation protocol: Yes   Plan:  10/26 @ 2300 HL 0.37 therapeutic. Will continue rate of 850 units/hr and will recheck HL @ 0700. hgb trending down, will continue to monitor.  10/27 @0728  HL= 0.20. Will order bolus of 1100 units and increase drip to 1000 units/hr. F/u HL in 6 hrs.  10/27 @1552  HL= 0.20. Will order bolus of 1100 units and increase drip to 1250 units/hr. F/u HL in 6 hours. hgb 10.4 Plt 86. Spoke with RN to confirm drip is running.  Bari Mantis PharmD Clinical Pharmacist 11/05/2017

## 2017-11-05 NOTE — Progress Notes (Signed)
[facility] - [place] at [facility]   PATIENT NAME: Kristina Archer    MR#:  [id number]  DATE OF BIRTH:  March 26, 1945 Patient much more comfortable today, seen in ICU, on high flow nasal cannula.  She says she feels better today less shortness of breath..   Chief Complaint  Patient presents with  . Back Pain    REVIEW OF SYSTEMS:   ROS CONSTITUTIONAL: No fever, fatigue or weakness.  EYES: No blurred or double vision.  EARS, NOSE, AND THROAT: No tinnitus or ear pain.  RESPIRATORY: Shortness of breath decreased.Marland Kitchen  CARDIOVASCULAR: No chest pain, orthopnea, edema.  GASTROINTESTINAL: No nausea, vomiting, diarrhea or abdominal pain.  GENITOURINARY: No dysuria, hematuria.  ENDOCRINE: No polyuria, nocturia,  HEMATOLOGY: No anemia, easy bruising or bleeding SKIN: No rash or lesion. MUSCULOSKELETAL: No joint pain or arthritis.   NEUROLOGIC: No tingling, numbness, weakness.  PSYCHIATRY: No anxiety or depression.   DRUG ALLERGIES:   Allergies  Allergen Reactions  . Codeine Nausea Only    VITALS:  Blood pressure (!) 111/57, pulse 65, temperature 98.1 F (36.7 C), temperature source Oral, resp. rate (!) 21, height 5\' 3"  (1.6 m), weight 86 kg, SpO2 94 %.  PHYSICAL EXAMINATION:  GENERAL:  72 y.o.-year-old patient lying in the bed with no acute distress.  EYES: Pupils equal, round, reactive to light and accommodation. No scleral icterus. Extraocular muscles intact.  HEENT: Head atraumatic, normocephalic. Oropharynx and nasopharynx clear.  NECK:  Supple, no jugular venous distention. No thyroid enlargement, no tenderness.  LUNGS: Diminished sounds bilaterally but no wheezing or rales.  CARDIOVASCULAR: S1, S2 regular no murmurs, rubs, or gallops.  ABDOMEN: Soft, nontender, nondistended. Bowel sounds present. No organomegaly or mass.  EXTREMITIES: No pedal edema, cyanosis, or clubbing.  NEUROLOGIC: Cranial nerves II through XII are intact. Muscle strength 5/5 in all  extremities. Sensation intact. Gait not checked.  PSYCHIATRIC: The patient is alert and oriented x 3.  SKIN: No obvious rash, lesion, or ulcer.    LABORATORY PANEL:   CBC Recent Labs  Lab 11/05/17 0728  WBC 5.6  HGB 10.4*  HCT 31.2*  PLT 86*   ------------------------------------------------------------------------------------------------------------------  Chemistries  Recent Labs  Lab 11/02/17 1408  11/05/17 0728  NA 134*   < > 136  K 3.6   < > 3.8  CL 102   < > 102  CO2 21*   < > 22  GLUCOSE 350*   < > 379*  BUN 16   < > 36*  CREATININE 1.28*   < > 1.24*  CALCIUM 9.9   < > 9.5  MG  --    < > 1.9  AST 67*  --   --   ALT 49*  --   --   ALKPHOS 98  --   --   BILITOT 1.5*  --   --    < > = values in this interval not displayed.   ------------------------------------------------------------------------------------------------------------------  Cardiac Enzymes Recent Labs  Lab 11/05/17 0728  TROPONINI 0.66*   ------------------------------------------------------------------------------------------------------------------  RADIOLOGY:  Dg Abd 1 View  Result Date: 11/03/2017 CLINICAL DATA:  72 year old female with increased pain after urologic stent placement. EXAM: ABDOMEN - 1 VIEW COMPARISON:  CT Abdomen and Pelvis 11/02/2017. FINDINGS: Two supine views at 1520 hours. Right double-J ureteral stent has been placed. The distal pigtail is appropriately located in the lower central pelvis. The proximal pigtail is partially on looped. The 10 millimeter right ureteropelvic junction obstructing calculus seen by CT  yesterday is not clearly identified. Numerous pelvic phleboliths redemonstrated. New moderate gaseous distension of the stomach. Normal bowel gas pattern otherwise. Stable cholecystectomy clips. No acute osseous abnormality identified. IMPRESSION: 1. Right double-J ureteral stent placed. The proximal pigtail of the catheter is partially un-coiled. The distal  pigtail appears satisfactory. The 10 mm right UPJ calculus seen by CT yesterday is not clearly identified. 2. Moderate new gaseous distension of the stomach, but normal bowel gas pattern otherwise. Electronically Signed   By: Odessa Fleming M.D.   On: 11/03/2017 15:41   Dg Chest Port 1 View  Result Date: 11/05/2017 CLINICAL DATA:  CHF EXAM: PORTABLE CHEST 1 VIEW COMPARISON:  11/03/2017 FINDINGS: Lungs are clear.  No pleural effusion or pneumothorax. The heart is top-normal in size. IMPRESSION: No evidence of acute cardiopulmonary disease. Electronically Signed   By: Charline Bills M.D.   On: 11/05/2017 08:46   Dg Chest Port 1 View  Result Date: 11/03/2017 CLINICAL DATA:  72 year old female with increasing shortness of breath after urologic stent placement. Wheezing. EXAM: PORTABLE CHEST 1 VIEW COMPARISON:  CT Abdomen and Pelvis 11/02/2017. FINDINGS: The lung bases were clear on the CT yesterday. AP seated view today at 1515 hours. Stable mild cardiomegaly. Other mediastinal contours are within normal limits. Visualized tracheal air column is within normal limits. Borderline to mild increased pulmonary interstitial markings now. No pneumothorax or consolidation. Questionable small pleural effusions. Negative visible bowel gas pattern. No acute osseous abnormality identified. IMPRESSION: 1. Mild pulmonary interstitial markings and possibly new small pleural effusions suspicious for Acute Pulmonary Edema. 2. Stable mild cardiomegaly. Electronically Signed   By: Odessa Fleming M.D.   On: 11/03/2017 15:39    EKG:   Orders placed or performed during the hospital encounter of 11/02/17  . ED EKG  . ED EKG  . EKG 12-Lead  . EKG 12-Lead  . EKG 12-Lead  . EKG 12-Lead  . EKG 12-Lead  . EKG 12-Lead  . EKG 12-Lead  . EKG 12-Lead  . EKG 12-Lead  . EKG 12-Lead  . EKG 12-Lead  . EKG 12-Lead  . EKG 12-Lead  . EKG 12-Lead  . EKG 12-Lead  . EKG 12-Lead    ASSESSMENT AND PLAN:   #1 /right ureteral calculus  with right-sided hydronephrosis secondary to obstructing calculus: Status post emergency right ureteral stent by urology yesterday patient has been afebrile after the procedure E. coli UTI, resistant to Rocephin, start Zosyn.  #2. non-ST elevation MI, troponin still going up to 0.80, seen by cardiology, continue heparin drip, high intensity statins, transferred to ICU yesterday.  Looks like patient developed A. fib with RVR, started on amiodarone infusion.  Continue Coreg.  #3. acute respiratory failure with hypoxia secondary to acute systolic heart failure EF 20% by echo, consult to stepdown, patient on high flow nasal cannula now she appears comfortable and less shortness of breath today.  On bronchodilators, IV steroids in addition to Lasix.  3 .  Diabetes mellitus type 2: Hold oral hypoglycemics as.  P.o. intake is poor today.  Sliding scale insulin with coverage.   #4 anxiety, depression: Patient is on Paxil.  #5. transient confusion received Haldol last night.  Patient told me that she does not want intubation or CPR.    . All the records are reviewed and case discussed with Care Management/Social Workerr. Management plans discussed with the patient, family and they are in agreement.  CODE STATUS: DNR TOTAL TIME TAKING CARE OF THIS PATIENT: 38 minutes coordination of  care   Patient is too critical to plan for discharge disposition.Marland Kitchen HIGH RISK FOR Cardiopulmonary arrest.  Katha Hamming M.D on 11/05/2017 at 1:16 PM  Between 7am to 6pm - Pager - [phone]  After 6pm go to [email or web address] - password [email or web address] [facility]  [place] [facility]  Office  [phone]  CC: Primary care physician; Corky Downs, MD   Note: This dictation was prepared with Dragon dictation along with smaller phrase technology. Any transcriptional errors that result from this process are unintentional.

## 2017-11-05 NOTE — Consult Note (Signed)
ANTICOAGULATION CONSULT NOTE   Pharmacy Consult for heparin infusion Indication: chest pain/ACS  Allergies  Allergen Reactions  . Codeine Nausea Only    Patient Measurements: Height: 5\' 3"  (160 cm) Weight: 189 lb 9.5 oz (86 kg) IBW/kg (Calculated) : 52.4 Heparin Dosing Weight: 71.7  Vital Signs: Temp: 98 F (36.7 C) (10/27 0400) Temp Source: Oral (10/27 0400) BP: 108/51 (10/27 0500) Pulse Rate: 81 (10/27 0500)  Labs: Recent Labs    11/03/17 1801 11/03/17 2315 11/04/17 0408 11/04/17 0750 11/04/17 1047 11/04/17 1720 11/04/17 2315 11/05/17 0728  HGB 11.8*  --   --   --  11.4*  --  10.4*  --   HCT 35.3*  --   --   --  34.4*  --  31.5*  --   PLT 88*  --   --   --  99*  --  98*  --   APTT  --   --   --   --  42*  --   --   --   LABPROT  --   --   --   --  16.0*  --   --   --   INR  --   --   --   --  1.29  --   --   --   HEPARINUNFRC  --   --   --   --   --  0.39 0.37 0.20*  CREATININE 1.37*  --   --  1.45*  --   --  1.36*  --   TROPONINI 0.27* 0.71* 0.80*  --   --   --   --   --     Estimated Creatinine Clearance: 40.6 mL/min (A) (by C-G formula based on SCr of 1.36 mg/dL (H)).   Medical History: Past Medical History:  Diagnosis Date  . Anxiety   . Cardiomyopathy (HCC)    a. 10/2017 Echo: EF 20-25%, diff HK, mild MR. Nl RV size.  . DDD (degenerative disc disease), lumbar   . Diverticulosis   . GERD (gastroesophageal reflux disease)   . High blood pressure   . High cholesterol   . LBBB (left bundle branch block)   . Nephrolithiasis   . Sciatica   . Type II diabetes mellitus (HCC)     Medications:  Medications Prior to Admission  Medication Sig Dispense Refill Last Dose  . amLODipine-benazepril (LOTREL) 5-10 MG capsule Take 1 capsule by mouth daily.   11/01/2017 at 0800  . dexlansoprazole (DEXILANT) 60 MG capsule Take 60 mg by mouth daily before breakfast.   11/01/2017 at 0700  . docusate sodium (COLACE) 100 MG capsule Take 1 capsule (100 mg total) by  mouth 2 (two) times daily. 60 capsule 0 Unknown at PRN  . furosemide (LASIX) 20 MG tablet Take 20 mg by mouth daily.   11/01/2017 at 0800  . gabapentin (NEURONTIN) 100 MG capsule Take 100 mg by mouth 3 (three) times daily as needed (back pain).   Unknown at PRN  . glimepiride (AMARYL) 2 MG tablet Take 2 mg by mouth daily with breakfast.   11/01/2017 at 0800  . metFORMIN (GLUCOPHAGE-XR) 500 MG 24 hr tablet Take 500 mg by mouth 2 (two) times daily.   11/01/2017 at 1800  . Multiple Vitamin (MULTIVITAMIN WITH MINERALS) TABS tablet Take 1 tablet by mouth daily.   11/01/2017 at 0800  . PARoxetine (PAXIL) 20 MG tablet Take 20 mg by mouth at bedtime.   11/01/2017 at 2000  .  rosuvastatin (CRESTOR) 5 MG tablet Take 5 mg by mouth daily.    11/01/2017 at 0800  . sitaGLIPtin (JANUVIA) 100 MG tablet Take 100 mg by mouth daily.   11/01/2017 at 0800  . HYDROcodone-acetaminophen (NORCO/VICODIN) 5-325 MG tablet Take 1-2 tablets by mouth every 6 (six) hours as needed for moderate pain. (Patient not taking: Reported on 11/02/2017) 10 tablet 0 Not Taking at Unknown time  . nitrofurantoin, macrocrystal-monohydrate, (MACROBID) 100 MG capsule Take 1 capsule (100 mg total) by mouth every 12 (twelve) hours. (Patient not taking: Reported on 11/02/2017) 20 capsule 0 Not Taking at Unknown time  . oxybutynin (DITROPAN) 5 MG tablet Take 1 tablet (5 mg total) by mouth every 8 (eight) hours as needed for bladder spasms. (Patient not taking: Reported on 11/02/2017) 30 tablet 0 Not Taking at Unknown time  . sulfamethoxazole-trimethoprim (BACTRIM DS,SEPTRA DS) 800-160 MG tablet Take 1 tablet by mouth every 12 (twelve) hours. (Patient not taking: Reported on 11/02/2017) 14 tablet 0 Not Taking at Unknown time  . tamsulosin (FLOMAX) 0.4 MG CAPS capsule Take 1 capsule (0.4 mg total) by mouth daily. (Patient not taking: Reported on 11/02/2017) 30 capsule 0 Not Taking at Unknown time    Assessment: 72 y.o. female with history of HTN, HL,  DMII, remote tob abuse, GERD, DDD, obesity, and anxiety, admitted with ureteral stone, complicated by acute systolic heart failure and elevated troponin.  Goal of Therapy:  Heparin level 0.3-0.7 units/ml Monitor platelets by anticoagulation protocol: Yes   Plan:  10/26 @ 2300 HL 0.37 therapeutic. Will continue rate of 850 units/hr and will recheck HL @ 0700. hgb trending down, will continue to monitor.  10/27 @0728  HL= 0.20. Will order bolus of 1100 units and increase drip to 1000 units/hr. F/u HL in 6 hrs.  Bari Mantis PharmD Clinical Pharmacist 11/05/2017

## 2017-11-06 DIAGNOSIS — I5023 Acute on chronic systolic (congestive) heart failure: Secondary | ICD-10-CM

## 2017-11-06 LAB — URINE CULTURE: Culture: 100000 — AB

## 2017-11-06 LAB — CBC
HEMATOCRIT: 31.6 % — AB (ref 36.0–46.0)
HEMOGLOBIN: 10.4 g/dL — AB (ref 12.0–15.0)
MCH: 27.7 pg (ref 26.0–34.0)
MCHC: 32.9 g/dL (ref 30.0–36.0)
MCV: 84.3 fL (ref 80.0–100.0)
Platelets: 133 10*3/uL — ABNORMAL LOW (ref 150–400)
RBC: 3.75 MIL/uL — ABNORMAL LOW (ref 3.87–5.11)
RDW: 14.1 % (ref 11.5–15.5)
WBC: 7.3 10*3/uL (ref 4.0–10.5)
nRBC: 0 % (ref 0.0–0.2)

## 2017-11-06 LAB — BASIC METABOLIC PANEL
Anion gap: 11 (ref 5–15)
BUN: 40 mg/dL — ABNORMAL HIGH (ref 8–23)
CHLORIDE: 101 mmol/L (ref 98–111)
CO2: 25 mmol/L (ref 22–32)
CREATININE: 1.3 mg/dL — AB (ref 0.44–1.00)
Calcium: 9.8 mg/dL (ref 8.9–10.3)
GFR calc non Af Amer: 41 mL/min — ABNORMAL LOW (ref >60)
GFR, EST AFRICAN AMERICAN: 47 mL/min — AB (ref >60)
Glucose, Bld: 371 mg/dL — ABNORMAL HIGH (ref 70–99)
POTASSIUM: 3.5 mmol/L (ref 3.5–5.1)
SODIUM: 137 mmol/L (ref 135–145)

## 2017-11-06 LAB — GLUCOSE, CAPILLARY
Glucose-Capillary: 201 mg/dL — ABNORMAL HIGH (ref 70–99)
Glucose-Capillary: 232 mg/dL — ABNORMAL HIGH (ref 70–99)
Glucose-Capillary: 295 mg/dL — ABNORMAL HIGH (ref 70–99)
Glucose-Capillary: 315 mg/dL — ABNORMAL HIGH (ref 70–99)
Glucose-Capillary: 339 mg/dL — ABNORMAL HIGH (ref 70–99)
Glucose-Capillary: 353 mg/dL — ABNORMAL HIGH (ref 70–99)
Glucose-Capillary: 390 mg/dL — ABNORMAL HIGH (ref 70–99)

## 2017-11-06 LAB — MAGNESIUM: MAGNESIUM: 1.9 mg/dL (ref 1.7–2.4)

## 2017-11-06 LAB — HEPARIN LEVEL (UNFRACTIONATED)
Heparin Unfractionated: 0.32 IU/mL (ref 0.30–0.70)
Heparin Unfractionated: 0.32 IU/mL (ref 0.30–0.70)

## 2017-11-06 LAB — TROPONIN I: Troponin I: 0.29 ng/mL (ref <0.03)

## 2017-11-06 MED ORDER — SODIUM CHLORIDE 0.9 % IV SOLN
1.0000 g | INTRAVENOUS | Status: DC
  Administered 2017-11-06 – 2017-11-08 (×3): 1000 mg via INTRAVENOUS
  Filled 2017-11-06 (×4): qty 1

## 2017-11-06 MED ORDER — INSULIN GLARGINE 100 UNIT/ML ~~LOC~~ SOLN
10.0000 [IU] | Freq: Every day | SUBCUTANEOUS | Status: DC
  Administered 2017-11-06 – 2017-11-07 (×2): 10 [IU] via SUBCUTANEOUS
  Filled 2017-11-06 (×3): qty 0.1

## 2017-11-06 MED ORDER — MAGNESIUM SULFATE IN D5W 1-5 GM/100ML-% IV SOLN
1.0000 g | Freq: Once | INTRAVENOUS | Status: AC
  Administered 2017-11-06: 1 g via INTRAVENOUS
  Filled 2017-11-06: qty 100

## 2017-11-06 MED ORDER — POTASSIUM CHLORIDE CRYS ER 20 MEQ PO TBCR
40.0000 meq | EXTENDED_RELEASE_TABLET | Freq: Once | ORAL | Status: AC
  Administered 2017-11-06: 40 meq via ORAL
  Filled 2017-11-06: qty 2

## 2017-11-06 NOTE — Progress Notes (Addendum)
[facility] Physicians - [place] at [facility]   PATIENT NAME: Kristina Archer    MR#:  [id number]  DATE OF BIRTH:  1946/01/05 patient is feeling much better today, no shortness of breath, no chest pain.  Patient is on amiodarone drip for A. fib.. Chief Complaint  Patient presents with  . Back Pain    REVIEW OF SYSTEMS:   Review of Systems  HENT: Positive for hearing loss.    CONSTITUTIONAL: No fever, fatigue or weakness.  EYES: No blurred or double vision.  EARS, NOSE, AND THROAT: No tinnitus or ear pain.  RESPIRATORY: Shortness of breath decreased.Marland Kitchen  CARDIOVASCULAR: No chest pain, orthopnea, edema.  GASTROINTESTINAL: No nausea, vomiting, diarrhea or abdominal pain.  GENITOURINARY: No dysuria, hematuria.  ENDOCRINE: No polyuria, nocturia,  HEMATOLOGY: No anemia, easy bruising or bleeding SKIN: No rash or lesion. MUSCULOSKELETAL: No joint pain or arthritis.   NEUROLOGIC: No tingling, numbness, weakness.  PSYCHIATRY: No anxiety or depression.   DRUG ALLERGIES:   Allergies  Allergen Reactions  . Codeine Nausea Only    VITALS:  Blood pressure 114/64, pulse 68, temperature 98.2 F (36.8 C), temperature source Oral, resp. rate 18, height 5\' 3"  (1.6 m), weight 80.1 kg, SpO2 91 %.  PHYSICAL EXAMINATION:  GENERAL:  72 y.o.-year-old patient lying in the bed with no acute distress.  EYES: Pupils equal, round, reactive to light and accommodation. No scleral icterus. Extraocular muscles intact.  HEENT: Head atraumatic, normocephalic. Oropharynx and nasopharynx clear.  NECK:  Supple, no jugular venous distention. No thyroid enlargement, no tenderness.  LUNGS: Diminished sounds bilaterally but no wheezing or rales.  CARDIOVASCULAR: S1, S2 regular no murmurs, rubs, or gallops.  ABDOMEN: Soft, nontender, nondistended. Bowel sounds present. No organomegaly or mass.  EXTREMITIES: No pedal edema, cyanosis, or clubbing.  NEUROLOGIC: Cranial nerves II through XII are intact.  Muscle strength 5/5 in all extremities. Sensation intact. Gait not checked.  PSYCHIATRIC: The patient is alert and oriented x 3.  SKIN: No obvious rash, lesion, or ulcer.    LABORATORY PANEL:   CBC Recent Labs  Lab 11/06/17 0439  WBC 7.3  HGB 10.4*  HCT 31.6*  PLT 133*   ------------------------------------------------------------------------------------------------------------------  Chemistries  Recent Labs  Lab 11/02/17 1408  11/06/17 1232  NA 134*   < > 137  K 3.6   < > 3.5  CL 102   < > 101  CO2 21*   < > 25  GLUCOSE 350*   < > 371*  BUN 16   < > 40*  CREATININE 1.28*   < > 1.30*  CALCIUM 9.9   < > 9.8  MG  --    < > 1.9  AST 67*  --   --   ALT 49*  --   --   ALKPHOS 98  --   --   BILITOT 1.5*  --   --    < > = values in this interval not displayed.   ------------------------------------------------------------------------------------------------------------------  Cardiac Enzymes Recent Labs  Lab 11/05/17 0728  TROPONINI 0.66*   ------------------------------------------------------------------------------------------------------------------  RADIOLOGY:  Dg Chest Port 1 View  Result Date: 11/05/2017 CLINICAL DATA:  CHF EXAM: PORTABLE CHEST 1 VIEW COMPARISON:  11/03/2017 FINDINGS: Lungs are clear.  No pleural effusion or pneumothorax. The heart is top-normal in size. IMPRESSION: No evidence of acute cardiopulmonary disease. Electronically Signed   By: Charline Bills M.D.   On: 11/05/2017 08:46    EKG:   Orders placed or performed during the  hospital encounter of 11/02/17  . ED EKG  . ED EKG  . EKG 12-Lead  . EKG 12-Lead  . EKG 12-Lead  . EKG 12-Lead  . EKG 12-Lead  . EKG 12-Lead  . EKG 12-Lead  . EKG 12-Lead  . EKG 12-Lead  . EKG 12-Lead  . EKG 12-Lead  . EKG 12-Lead  . EKG 12-Lead  . EKG 12-Lead  . EKG 12-Lead  . EKG 12-Lead    ASSESSMENT AND PLAN:   #1 /right ureteral calculus with right-sided hydronephrosis secondary to  obstructing calculus: Status post emergency right ureteral stent by urology yesterday patient has been afebrile after the procedure E. coli UTI, resistant to Rocephin, started on meropenem yesterday..  #2. non-ST elevation MI, troponin still going up to 0.80, seen by cardiology, continue heparin drip, high intensity statins, transferred to ICU , continue amiodarone drip, continue Coreg, possible cardiac cath this week.  #3. acute respiratory failure with hypoxia secondary to acute systolic heart failure EF 20% by echo, clinically patient feels better, shortness of breath improved, hypoxia improved, continue IV Lasix.  Continue IV Lasix for another day as per cardiology note.  3 .  Diabetes mellitus type 2:, Uncontrolled: Received steroids' continue sliding scale insulin with coverage, start on Lantus, diabetes coordinator recommends 12 units of Lantus daily. #4 anxiety, depression: Resume  Paxil.     . All the records are reviewed and case discussed with Care Management/Social Workerr. Management plans discussed with the patient, family and they are in agreement.  CODE STATUS: DNR TOTAL TIME TAKING CARE OF THIS PATIENT: 38 minutes coordination of care  More than 50% of time spent in counseling, coordination of care. Patient is too critical to plan for discharge disposition.Marland Kitchen HIGH RISK FOR Cardiopulmonary arrest.  Katha Hamming M.D on 11/06/2017 at 2:12 PM  Between 7am to 6pm - Pager - [phone]  After 6pm go to [email or web address] - password [email or web address] [facility]  [place]  Hospitalists  Office  [phone]  CC: Primary care physician; Corky Downs, MD   Note: This dictation was prepared with Dragon dictation along with smaller phrase technology. Any transcriptional errors that result from this process are unintentional.

## 2017-11-06 NOTE — Progress Notes (Signed)
Pharmacy Electrolyte Monitoring Consult:  Pharmacy consulted to assist in monitoring and replacing electrolytes in this 72 y.o. female admitted on 11/02/2017 with right ureteral stone and CHF.   Patient developed A. Fib with RVR overnight and was placed on amiodarone drip.   Patient is ordered furosemide 40mg  IV Q12hr and potassium PO Daily.   Labs:  Sodium (mmol/L)  Date Value  11/05/2017 136  10/11/2011 143   Potassium (mmol/L)  Date Value  11/05/2017 3.8  10/11/2011 3.4 (L)   Magnesium (mg/dL)  Date Value  40/98/1191 2.1   Phosphorus (mg/dL)  Date Value  47/82/9562 2.8   Calcium (mg/dL)  Date Value  13/08/6576 9.7   Calcium, Total (mg/dL)  Date Value  46/96/2952 10.0   Albumin (g/dL)  Date Value  84/13/2440 4.0  10/11/2011 4.3    Assessment/Plan: Will order additional potasisum PO x 1 for total potassium replacement on 10/28 of . Will order magnesium 2g IV x 1 and initiate magnesium oxide 400mg  PO Daily.   Will continue to replace orally as possible in setting of CHF requiring furosemide.   In setting of CHF/Afib, will replace for goal potassium ~ 4 and goal magnesium ~2.   Will recheck electrolytes with am labs.  Pharmacy will continue to monitor and adjust per consult.   Simpson,Michael L 11/06/2017 11:59 AM

## 2017-11-06 NOTE — Consult Note (Signed)
ANTICOAGULATION CONSULT NOTE   Pharmacy Consult for heparin infusion Indication: chest pain/ACS  Allergies  Allergen Reactions  . Codeine Nausea Only    Patient Measurements: Height: 5\' 3"  (160 cm) Weight: 176 lb 9.4 oz (80.1 kg) IBW/kg (Calculated) : 52.4 Heparin Dosing Weight: 71.7  Vital Signs: Temp: 98.6 F (37 C) (10/28 0150) Temp Source: Oral (10/28 0150) BP: 120/62 (10/28 0600) Pulse Rate: 71 (10/28 0600)  Labs: Recent Labs    11/03/17 2315 11/04/17 0408  11/04/17 1047  11/04/17 2315 11/05/17 0728 11/05/17 1552 11/05/17 2315 11/06/17 0439  HGB  --   --   --  11.4*  --  10.4* 10.4*  --   --  10.4*  HCT  --   --   --  34.4*  --  31.5* 31.2*  --   --  31.6*  PLT  --   --   --  99*  --  98* 86*  --   --  133*  APTT  --   --   --  42*  --   --   --   --   --   --   LABPROT  --   --   --  16.0*  --   --   --   --   --   --   INR  --   --   --  1.29  --   --   --   --   --   --   HEPARINUNFRC  --   --   --   --    < > 0.37 0.20* 0.20* 0.32 0.32  CREATININE  --   --    < >  --   --  1.36* 1.24* 1.20*  --   --   TROPONINI 0.71* 0.80*  --   --   --   --  0.66*  --   --   --    < > = values in this interval not displayed.    Estimated Creatinine Clearance: 44.4 mL/min (A) (by C-G formula based on SCr of 1.2 mg/dL (H)).   Medical History: Past Medical History:  Diagnosis Date  . Anxiety   . Cardiomyopathy (HCC)    a. 10/2017 Echo: EF 20-25%, diff HK, mild MR. Nl RV size.  . DDD (degenerative disc disease), lumbar   . Diverticulosis   . GERD (gastroesophageal reflux disease)   . High blood pressure   . High cholesterol   . LBBB (left bundle branch block)   . Nephrolithiasis   . Sciatica   . Type II diabetes mellitus (HCC)     Medications:  Medications Prior to Admission  Medication Sig Dispense Refill Last Dose  . amLODipine-benazepril (LOTREL) 5-10 MG capsule Take 1 capsule by mouth daily.   11/01/2017 at 0800  . dexlansoprazole (DEXILANT) 60 MG  capsule Take 60 mg by mouth daily before breakfast.   11/01/2017 at 0700  . docusate sodium (COLACE) 100 MG capsule Take 1 capsule (100 mg total) by mouth 2 (two) times daily. 60 capsule 0 Unknown at PRN  . furosemide (LASIX) 20 MG tablet Take 20 mg by mouth daily.   11/01/2017 at 0800  . gabapentin (NEURONTIN) 100 MG capsule Take 100 mg by mouth 3 (three) times daily as needed (back pain).   Unknown at PRN  . glimepiride (AMARYL) 2 MG tablet Take 2 mg by mouth daily with breakfast.   11/01/2017 at 0800  . metFORMIN (GLUCOPHAGE-XR)  500 MG 24 hr tablet Take 500 mg by mouth 2 (two) times daily.   11/01/2017 at 1800  . Multiple Vitamin (MULTIVITAMIN WITH MINERALS) TABS tablet Take 1 tablet by mouth daily.   11/01/2017 at 0800  . PARoxetine (PAXIL) 20 MG tablet Take 20 mg by mouth at bedtime.   11/01/2017 at 2000  . rosuvastatin (CRESTOR) 5 MG tablet Take 5 mg by mouth daily.    11/01/2017 at 0800  . sitaGLIPtin (JANUVIA) 100 MG tablet Take 100 mg by mouth daily.   11/01/2017 at 0800  . HYDROcodone-acetaminophen (NORCO/VICODIN) 5-325 MG tablet Take 1-2 tablets by mouth every 6 (six) hours as needed for moderate pain. (Patient not taking: Reported on 11/02/2017) 10 tablet 0 Not Taking at Unknown time  . nitrofurantoin, macrocrystal-monohydrate, (MACROBID) 100 MG capsule Take 1 capsule (100 mg total) by mouth every 12 (twelve) hours. (Patient not taking: Reported on 11/02/2017) 20 capsule 0 Not Taking at Unknown time  . oxybutynin (DITROPAN) 5 MG tablet Take 1 tablet (5 mg total) by mouth every 8 (eight) hours as needed for bladder spasms. (Patient not taking: Reported on 11/02/2017) 30 tablet 0 Not Taking at Unknown time  . sulfamethoxazole-trimethoprim (BACTRIM DS,SEPTRA DS) 800-160 MG tablet Take 1 tablet by mouth every 12 (twelve) hours. (Patient not taking: Reported on 11/02/2017) 14 tablet 0 Not Taking at Unknown time  . tamsulosin (FLOMAX) 0.4 MG CAPS capsule Take 1 capsule (0.4 mg total) by mouth  daily. (Patient not taking: Reported on 11/02/2017) 30 capsule 0 Not Taking at Unknown time    Assessment: 72 y.o. female with history of HTN, HL, DMII, remote tob abuse, GERD, DDD, obesity, and anxiety, admitted with ureteral stone, complicated by acute systolic heart failure and elevated troponin.  Goal of Therapy:  Heparin level 0.3-0.7 units/ml Monitor platelets by anticoagulation protocol: Yes   Plan:  10/28 @ HL 0.32 therapeutic. Will continue rate at 1250 units/hr and will recheck HL w/ am labs.  Thomasene Ripple, PharmD, BCPS Clinical Pharmacist 11/06/2017

## 2017-11-06 NOTE — Progress Notes (Signed)
Inpatient Diabetes Program Recommendations  AACE/ADA: New Consensus Statement on Inpatient Glycemic Control (2015)  Target Ranges:  Prepandial:   less than 140 mg/dL      Peak postprandial:   less than 180 mg/dL (1-2 hours)      Critically ill patients:  140 - 180 mg/dL   Results for Kristina Archer, Kristina Archer (MRN [id number]) as of 11/06/2017 08:48  Ref. Range 11/05/2017 07:23 11/05/2017 11:36 11/05/2017 16:07 11/05/2017 22:06 11/05/2017 23:52  Glucose-Capillary Latest Ref Range: 70 - 99 mg/dL 034 (H)  7 units NOVOLOG 409 (H)  9 units NOVOLOG 398 (H) 430 (H)  10 units NOVOLOG 390 (H)  15 units NOVOLOG   Results for Kristina Archer, Kristina Archer (MRN [id number]) as of 11/06/2017 08:48  Ref. Range 11/06/2017 03:51 11/06/2017 07:28  Glucose-Capillary Latest Ref Range: 70 - 99 mg/dL 756 (H)  8 units NOVOLOG 232 (H)  5 units NOVOLOG     Home DM Meds: Amaryl 2 mg Daily         Metformin 500 mg BID       Januvia 100 mg Daily  Current Orders: Novolog Moderate Correction Scale/ SSI (0-15 units) Q4 hours      Tradjenta 5 mg Daily       Note patient getting Solumedrol 40 mg Q8 hours.  Last dose of Solumedrol per [facility] scheduled to be given today at 2pm.  Note Novolog SSI coverage increased to Moderate scale Q4 hours last PM (was Sensitive scale 0-9 units).    MD- May consider starting basal insulin for patient in hospital while home DM meds are on hold and patient getting steroids:  Lantus 12 units Daily (0.15 units/kg dosing)     --Will follow patient during hospitalization--  Ambrose Finland RN, MSN, CDE Diabetes Coordinator Inpatient Glycemic Control Team Team Pager: [phone] (8a-5p)

## 2017-11-06 NOTE — Consult Note (Signed)
ANTICOAGULATION CONSULT NOTE   Pharmacy Consult for heparin infusion Indication: chest pain/ACS  Allergies  Allergen Reactions  . Codeine Nausea Only    Patient Measurements: Height: 5\' 3"  (160 cm) Weight: 189 lb 9.5 oz (86 kg) IBW/kg (Calculated) : 52.4 Heparin Dosing Weight: 71.7  Vital Signs: Temp: 98.2 F (36.8 C) (10/27 1917) Temp Source: Oral (10/27 1917) BP: 118/59 (10/28 0000) Pulse Rate: 81 (10/28 0000)  Labs: Recent Labs    11/03/17 2315 11/04/17 0408  11/04/17 1047  11/04/17 2315 11/05/17 0728 11/05/17 1552 11/05/17 2315  HGB  --   --   --  11.4*  --  10.4* 10.4*  --   --   HCT  --   --   --  34.4*  --  31.5* 31.2*  --   --   PLT  --   --   --  99*  --  98* 86*  --   --   APTT  --   --   --  42*  --   --   --   --   --   LABPROT  --   --   --  16.0*  --   --   --   --   --   INR  --   --   --  1.29  --   --   --   --   --   HEPARINUNFRC  --   --   --   --    < > 0.37 0.20* 0.20* 0.32  CREATININE  --   --    < >  --   --  1.36* 1.24* 1.20*  --   TROPONINI 0.71* 0.80*  --   --   --   --  0.66*  --   --    < > = values in this interval not displayed.    Estimated Creatinine Clearance: 46 mL/min (A) (by C-G formula based on SCr of 1.2 mg/dL (H)).   Medical History: Past Medical History:  Diagnosis Date  . Anxiety   . Cardiomyopathy (HCC)    a. 10/2017 Echo: EF 20-25%, diff HK, mild MR. Nl RV size.  . DDD (degenerative disc disease), lumbar   . Diverticulosis   . GERD (gastroesophageal reflux disease)   . High blood pressure   . High cholesterol   . LBBB (left bundle branch block)   . Nephrolithiasis   . Sciatica   . Type II diabetes mellitus (HCC)     Medications:  Medications Prior to Admission  Medication Sig Dispense Refill Last Dose  . amLODipine-benazepril (LOTREL) 5-10 MG capsule Take 1 capsule by mouth daily.   11/01/2017 at 0800  . dexlansoprazole (DEXILANT) 60 MG capsule Take 60 mg by mouth daily before breakfast.   11/01/2017 at  0700  . docusate sodium (COLACE) 100 MG capsule Take 1 capsule (100 mg total) by mouth 2 (two) times daily. 60 capsule 0 Unknown at PRN  . furosemide (LASIX) 20 MG tablet Take 20 mg by mouth daily.   11/01/2017 at 0800  . gabapentin (NEURONTIN) 100 MG capsule Take 100 mg by mouth 3 (three) times daily as needed (back pain).   Unknown at PRN  . glimepiride (AMARYL) 2 MG tablet Take 2 mg by mouth daily with breakfast.   11/01/2017 at 0800  . metFORMIN (GLUCOPHAGE-XR) 500 MG 24 hr tablet Take 500 mg by mouth 2 (two) times daily.   11/01/2017 at 1800  .  Multiple Vitamin (MULTIVITAMIN WITH MINERALS) TABS tablet Take 1 tablet by mouth daily.   11/01/2017 at 0800  . PARoxetine (PAXIL) 20 MG tablet Take 20 mg by mouth at bedtime.   11/01/2017 at 2000  . rosuvastatin (CRESTOR) 5 MG tablet Take 5 mg by mouth daily.    11/01/2017 at 0800  . sitaGLIPtin (JANUVIA) 100 MG tablet Take 100 mg by mouth daily.   11/01/2017 at 0800  . HYDROcodone-acetaminophen (NORCO/VICODIN) 5-325 MG tablet Take 1-2 tablets by mouth every 6 (six) hours as needed for moderate pain. (Patient not taking: Reported on 11/02/2017) 10 tablet 0 Not Taking at Unknown time  . nitrofurantoin, macrocrystal-monohydrate, (MACROBID) 100 MG capsule Take 1 capsule (100 mg total) by mouth every 12 (twelve) hours. (Patient not taking: Reported on 11/02/2017) 20 capsule 0 Not Taking at Unknown time  . oxybutynin (DITROPAN) 5 MG tablet Take 1 tablet (5 mg total) by mouth every 8 (eight) hours as needed for bladder spasms. (Patient not taking: Reported on 11/02/2017) 30 tablet 0 Not Taking at Unknown time  . sulfamethoxazole-trimethoprim (BACTRIM DS,SEPTRA DS) 800-160 MG tablet Take 1 tablet by mouth every 12 (twelve) hours. (Patient not taking: Reported on 11/02/2017) 14 tablet 0 Not Taking at Unknown time  . tamsulosin (FLOMAX) 0.4 MG CAPS capsule Take 1 capsule (0.4 mg total) by mouth daily. (Patient not taking: Reported on 11/02/2017) 30 capsule 0 Not  Taking at Unknown time    Assessment: 72 y.o. female with history of HTN, HL, DMII, remote tob abuse, GERD, DDD, obesity, and anxiety, admitted with ureteral stone, complicated by acute systolic heart failure and elevated troponin.  Goal of Therapy:  Heparin level 0.3-0.7 units/ml Monitor platelets by anticoagulation protocol: Yes   Plan:  10/27 @ 2300 HL 0.32 therapeutic. Will continue current rate and will recheck w/ am labs.  Thomasene Ripple, PharmD, BCPS Clinical Pharmacist 11/06/2017

## 2017-11-06 NOTE — Progress Notes (Signed)
Name: Kristina Archer MRN: [id number] DOB: [birth year]     CONSULTATION DATE: 11/02/2017  Subjective & objectives: Tolerating high flow nasal cannula 10 L/min  PAST MEDICAL HISTORY :   has a past medical history of Anxiety, Cardiomyopathy (HCC), DDD (degenerative disc disease), lumbar, Diverticulosis, GERD (gastroesophageal reflux disease), High blood pressure, High cholesterol, LBBB (left bundle branch block), Nephrolithiasis, Sciatica, and Type II diabetes mellitus (HCC).  has a past surgical history that includes Cholecystectomy; Abdominal hysterectomy; Cystoscopy/ureteroscopy/holmium laser/stent placement (Left, 05/24/2017); Cystoscopy w/ retrogrades (Bilateral, 05/24/2017); and Cystoscopy w/ ureteral stent placement (Right, 11/02/2017). Prior to Admission medications   Medication Sig Start Date End Date Taking? Authorizing Provider  amLODipine-benazepril (LOTREL) 5-10 MG capsule Take 1 capsule by mouth daily.   Yes [provider]  dexlansoprazole (DEXILANT) 60 MG capsule Take 60 mg by mouth daily before breakfast.   Yes [provider]  docusate sodium (COLACE) 100 MG capsule Take 1 capsule (100 mg total) by mouth 2 (two) times daily. 05/24/17  Yes Vanna Scotland, MD  furosemide (LASIX) 20 MG tablet Take 20 mg by mouth daily.   Yes [provider]  gabapentin (NEURONTIN) 100 MG capsule Take 100 mg by mouth 3 (three) times daily as needed (back pain).   Yes [provider]  glimepiride (AMARYL) 2 MG tablet Take 2 mg by mouth daily with breakfast.   Yes [provider]  metFORMIN (GLUCOPHAGE-XR) 500 MG 24 hr tablet Take 500 mg by mouth 2 (two) times daily.   Yes [provider]  Multiple Vitamin (MULTIVITAMIN WITH MINERALS) TABS tablet Take 1 tablet by mouth daily.   Yes [provider]  PARoxetine (PAXIL) 20 MG tablet Take 20 mg by mouth at bedtime.   Yes [provider]  rosuvastatin (CRESTOR) 5 MG tablet Take 5 mg  by mouth daily.  06/25/14  Yes [provider]  sitaGLIPtin (JANUVIA) 100 MG tablet Take 100 mg by mouth daily.   Yes [provider]  HYDROcodone-acetaminophen (NORCO/VICODIN) 5-325 MG tablet Take 1-2 tablets by mouth every 6 (six) hours as needed for moderate pain. Patient not taking: Reported on 11/02/2017 05/24/17   Vanna Scotland, MD  nitrofurantoin, macrocrystal-monohydrate, (MACROBID) 100 MG capsule Take 1 capsule (100 mg total) by mouth every 12 (twelve) hours. Patient not taking: Reported on 11/02/2017 06/12/17   Vanna Scotland, MD  oxybutynin (DITROPAN) 5 MG tablet Take 1 tablet (5 mg total) by mouth every 8 (eight) hours as needed for bladder spasms. Patient not taking: Reported on 11/02/2017 05/24/17   Vanna Scotland, MD  sulfamethoxazole-trimethoprim (BACTRIM DS,SEPTRA DS) 800-160 MG tablet Take 1 tablet by mouth every 12 (twelve) hours. Patient not taking: Reported on 11/02/2017 06/09/17   Vanna Scotland, MD  tamsulosin (FLOMAX) 0.4 MG CAPS capsule Take 1 capsule (0.4 mg total) by mouth daily. Patient not taking: Reported on 11/02/2017 05/24/17   Vanna Scotland, MD   Allergies  Allergen Reactions  . Codeine Nausea Only    FAMILY HISTORY:  family history includes Heart attack (age of onset: 57) in her father. SOCIAL HISTORY:  reports that she quit smoking about 20 years ago. Her smoking use included cigarettes. She has a 22.50 pack-year smoking history. She has never used smokeless tobacco. She reports that she drank alcohol. She reports that she does not use drugs.  REVIEW OF SYSTEMS:   Unable to obtain due to critical illness   VITAL SIGNS: Temp:  [98.2 F (36.8 C)-98.6 F (37 C)] 98.2 F (36.8 C) (10/28  1610) Pulse Rate:  [68-86] 70 (10/28 1800) Resp:  [16-30] 18 (10/28 0700) BP: (107-130)/(54-68) 119/68 (10/28 1800) SpO2:  [88 %-97 %] 88 % (10/28 1800) Weight:  [80.1 kg] 80.1 kg (10/28 0414)   Physical Examination: Awake, oriented and no focal  neurological deficits On high flow nasal 10/min, no respiratory distress, able to talk in full sentences bilateral equal air entry and no wheezes S1 & S2 are audible with no murmur Benign abdominal exam with feeble peristalsis No leg edema   ASSESSMENT / PLAN:  Acute respiratory failure.  Improved and tolerating high flow nasal cannula 10% -Monitor work of breathing and ABG.  Atelectasis and pneumonia.   Improved left lower airspace disease and pulmonary congestion with a small left pleural effusion -Invanz.MRSA PCR negative -Monitor CXR + CBC + FiO2  Acute exacerbation of COPD versus reactive airway disease -Optimize bronchodilators, tapering systemic steroids and antibiotics  UTI with obstructive uropathy status post the placement of double-J ureter on the right.Urine culture grew E. coli -Invanz  AKI with ATN and sepsis (improved) -Optimize hydration, avoid nephrotoxins, monitor renal panel and urine output  *Elevated troponin was demand versus supply mismatch and baseline coronary artery disease and left bundle branch.  *HFrEF was pulmonary congestion.LVEF 20 to 25% with diffuse hypokinesis -Heparin drip + aspirin + beta-blocker + statin -Diuresis to improve lung compliance -Management as per cardiology and plan for invasive ischemia work-up.  Hypertension -Optimize antihypertensives and monitor  Diabetes mellitus -Glycemic control and taper systemic steroids  Anxiety and restlessness on the BiPAP -Optimize benzodiazepine and consider Precedex.  Anemia -Maintain hemoglobin more than 8 g/dL  Thrombocytopenia (improved) -Monitor platelet counts  DNR  DVT &GI prophylaxis. Continue supportive care  Critical care time 35 minutes

## 2017-11-06 NOTE — Progress Notes (Signed)
Progress Note  Patient Name: Kristina Archer Date of Encounter: 11/06/2017  Primary Cardiologist: Julien Nordmann, MD  Subjective   No cardiac complaints of rapid heart rate, palpitations, chest pain. No SOB, having just received her AM breathing treatment. She remains on [place]. No right sided flank pain today.   She reports that she did not sleep well last night and feels it is because she did not have her paxil. She states she wants to go home.   Started on amiodarone infusion last night after Dr. Okey Dupre was contacted d/t developed Afib with RVR and baseline LBBB at 10:18AM 10/27. EKG confirmed Afib with RVR and LBBB.  Patient denies palpitations today and is currently in SR on telemetry.   Plan for continued diuresis with possible L/RHC this upcoming Wednesday 10/30. Close monitoring of renal function.  Inpatient Medications    Scheduled Meds: . aspirin  81 mg Oral Daily  . carvedilol  3.125 mg Oral BID WC  . docusate sodium  100 mg Oral BID  . furosemide  40 mg Intravenous BID  . haloperidol lactate  5 mg Intravenous Once  . insulin aspart  0-15 Units Subcutaneous Q4H  . insulin aspart  14 Units Subcutaneous Once  . ipratropium-albuterol  3 mL Nebulization Q6H  . linagliptin  5 mg Oral Daily  . mouth rinse  15 mL Mouth Rinse BID  . methylPREDNISolone (SOLU-MEDROL) injection  40 mg Intravenous Q8H  . pantoprazole  40 mg Oral Daily  . PARoxetine  20 mg Oral QHS  . polyethylene glycol  17 g Oral Daily  . potassium chloride  20 mEq Oral Daily  . rosuvastatin  40 mg Oral Daily   Continuous Infusions: . amiodarone 30 mg/hr (11/06/17 0600)  . ertapenem    . heparin 1,250 Units/hr (11/06/17 0600)   PRN Meds: acetaminophen **OR** acetaminophen, bisacodyl, levalbuterol, LORazepam, ondansetron **OR** ondansetron (ZOFRAN) IV, traZODone   Vital Signs    Vitals:   11/06/17 0400 11/06/17 0414 11/06/17 0500 11/06/17 0600  BP: (!) 108/56  (!) 123/57 120/62  Pulse: 73  69 71  Resp:  16  17 17   Temp:      TempSrc:      SpO2: (!) 88%  96% 91%  Weight:  80.1 kg    Height:        Intake/Output Summary (Last 24 hours) at 11/06/2017 0724 Last data filed at 11/06/2017 0600 Gross per 24 hour  Intake 916.46 ml  Output 1400 ml  Net -483.54 ml   Filed Weights   11/04/17 0337 11/05/17 0500 11/06/17 0414  Weight: 92.1 kg 86 kg 80.1 kg    Telemetry    SR, 70s-100s bpm- Personally Reviewed  ECG    No new tracing.  Physical Exam   GEN: No acute distress.   Neck:  JVP difficult to assess but likely slightly elevated. Cardiac:  Distant heart sounds.  Regular rate and rhythm without murmurs. Respiratory: Breath sounds slightly diminished at the bases. GI: Obese, Soft, nontender, non-distended  MS:  Trace/ trivial  bl LEE; No deformity. Neuro:  Nonfocal  Psych: Normal affect.  Labs    Chemistry Recent Labs  Lab 11/02/17 1408  11/04/17 2315 11/05/17 0728 11/05/17 1552  NA 134*   < > 137 136 136  K 3.6   < > 3.9 3.8 3.8  CL 102   < > 104 102 102  CO2 21*   < > 25 22 25   GLUCOSE 350*   < >  349* 379* 453*  BUN 16   < > 30* 36* 41*  CREATININE 1.28*   < > 1.36* 1.24* 1.20*  CALCIUM 9.9   < > 9.4 9.5 9.7  PROT 7.4  --   --   --   --   ALBUMIN 4.0  --   --   --   --   AST 67*  --   --   --   --   ALT 49*  --   --   --   --   ALKPHOS 98  --   --   --   --   BILITOT 1.5*  --   --   --   --   GFRNONAA 42*   < > 39* 43* 45*  GFRAA 48*   < > 45* 50* 52*  ANIONGAP 11   < > 8 12 9    < > = values in this interval not displayed.     Hematology Recent Labs  Lab 11/04/17 2315 11/05/17 0728 11/06/17 0439  WBC 5.5 5.6 7.3  RBC 3.73* 3.65* 3.75*  HGB 10.4* 10.4* 10.4*  HCT 31.5* 31.2* 31.6*  MCV 84.5 85.5 84.3  MCH 27.9 28.5 27.7  MCHC 33.0 33.3 32.9  RDW 14.3 14.4 14.1  PLT 98* 86* 133*    Cardiac Enzymes Recent Labs  Lab 11/03/17 1801 11/03/17 2315 11/04/17 0408 11/05/17 0728  TROPONINI 0.27* 0.71* 0.80* 0.66*   No results for input(s):  TROPIPOC in the last 168 hours.   BNP Recent Labs  Lab 11/03/17 1801  BNP 862.0*     DDimer No results for input(s): DDIMER in the last 168 hours.   Radiology    Dg Chest Port 1 View  Result Date: 11/05/2017 CLINICAL DATA:  CHF EXAM: PORTABLE CHEST 1 VIEW COMPARISON:  11/03/2017 FINDINGS: Lungs are clear.  No pleural effusion or pneumothorax. The heart is top-normal in size. IMPRESSION: No evidence of acute cardiopulmonary disease. Electronically Signed   By: Charline Bills M.D.   On: 11/05/2017 08:46    Cardiac Studies   Echocardiogram (11/03/17): - Left ventricle: The cavity size was mildly dilated. Systolic function was severely reduced. The estimated ejection fraction was in the range of 20% to 25%. Diffuse hypokinesis. Regional wall motion abnormalities cannot be excluded. The study is not technically sufficient to allow evaluation of LV diastolic function. - Mitral valve: There was mild regurgitation. - Left atrium: The atrium was normal in size. - Right ventricle: Systolic function was normal. - Pulmonary arteries: Systolic pressure could not be accurately estimated.  Patient Profile     72 y.o. female with history of HTN, HL, DMII, remote tob abuse, GERD, DDD, obesity, and anxiety, admitted with ureteral stone, complicated by acute systolic heart failure and elevated troponin.  Assessment & Plan    Acute respiratory failure with hypoxia and acute systolic heart failure In the setting of likely COPD exacerbation.  Continue diuresis with furosemide 40 mg IV twice daily as patient still shows sign of volume overload. Continue COPD treatment per medicine and critical care. Wean oxygen, as tolerated. Continue carvedilol 3.125 mg twice daily. Consider addition of ACE inhibitor or ARB this week if BP remains consistently stable - currently labile and at times soft BP. Continue ASA, BB, statin, lasix, heparin. Continue amiodarone with short run of Afib,  currently SR.   Demand ischemia No current chest pain, improved SOB.  Cannot r/o underlying CAD contributing to cardiomyopathy at this time. Troponin peaked 0.80  and trending down, elevated in the setting of demand ischemia 0.29  0.71  0.80  0.66  Continue aspirin, carvedilol, and rosuvastatin. Consider addition of ACE/ARB if BP remains consistently stable. Continue heparin. Continue amiodarone with Afib noted yesterday 10/27 but converted back to sinus rhythm. Currently SR.  Diuresing with plan for L/R heart cardiac cath this Wednesday   Acute kidney injury Slight bump in creatinine noted from baseline during this admission (0.9-1 -> 1.5).  Improved since admission and 10/27 Cr 1.20. Consider multifactorial etiology including acute decompensated heart failure complicated by recent ureteral stone.  Daily BMP, continue diuresis. Monitor I/O, daily weights. Caution with nephrotoxic agents.  Recommend continue to hold metformin; hold metformin 48h following cath.  Hypokalemia - K 3.8. Recommend replete with goal 4.0 - Mg 2.1 - Daily BMET  Anemia - Hgb 10.4 - Monitor as on heparin. Per IM  Ureteral stone Asymptomatic at this time following ureteral stent placement by urology.  Per urology and internal medicine.  DM2 - SSI - Hold metformin - Per IM  For questions or updates, please contact [facility] Please consult [email or web address] for contact info under [facility].  Signed, Lennon Alstrom, PA-C  11/06/2017, 7:24 AM

## 2017-11-07 ENCOUNTER — Inpatient Hospital Stay: Payer: Medicare HMO

## 2017-11-07 LAB — CBC WITH DIFFERENTIAL/PLATELET
Abs Immature Granulocytes: 0.29 10*3/uL — ABNORMAL HIGH (ref 0.00–0.07)
BASOS ABS: 0 10*3/uL (ref 0.0–0.1)
BASOS PCT: 0 %
Eosinophils Absolute: 0 10*3/uL (ref 0.0–0.5)
Eosinophils Relative: 0 %
HCT: 36.6 % (ref 36.0–46.0)
Hemoglobin: 12.1 g/dL (ref 12.0–15.0)
IMMATURE GRANULOCYTES: 3 %
LYMPHS ABS: 1.1 10*3/uL (ref 0.7–4.0)
Lymphocytes Relative: 11 %
MCH: 27.8 pg (ref 26.0–34.0)
MCHC: 33.1 g/dL (ref 30.0–36.0)
MCV: 83.9 fL (ref 80.0–100.0)
MONOS PCT: 11 %
Monocytes Absolute: 1.1 10*3/uL — ABNORMAL HIGH (ref 0.1–1.0)
NEUTROS ABS: 7.6 10*3/uL (ref 1.7–7.7)
NEUTROS PCT: 75 %
PLATELETS: 161 10*3/uL (ref 150–400)
RBC: 4.36 MIL/uL (ref 3.87–5.11)
RDW: 14.1 % (ref 11.5–15.5)
WBC: 10.2 10*3/uL (ref 4.0–10.5)
nRBC: 0 % (ref 0.0–0.2)

## 2017-11-07 LAB — BASIC METABOLIC PANEL
ANION GAP: 10 (ref 5–15)
BUN: 35 mg/dL — ABNORMAL HIGH (ref 8–23)
CO2: 28 mmol/L (ref 22–32)
Calcium: 9.7 mg/dL (ref 8.9–10.3)
Chloride: 105 mmol/L (ref 98–111)
Creatinine, Ser: 1.2 mg/dL — ABNORMAL HIGH (ref 0.44–1.00)
GFR, EST AFRICAN AMERICAN: 52 mL/min — AB (ref >60)
GFR, EST NON AFRICAN AMERICAN: 45 mL/min — AB (ref >60)
GLUCOSE: 101 mg/dL — AB (ref 70–99)
POTASSIUM: 3.2 mmol/L — AB (ref 3.5–5.1)
Sodium: 143 mmol/L (ref 135–145)

## 2017-11-07 LAB — GLUCOSE, CAPILLARY
GLUCOSE-CAPILLARY: 96 mg/dL (ref 70–99)
GLUCOSE-CAPILLARY: 159 mg/dL — AB (ref 70–99)
GLUCOSE-CAPILLARY: 143 mg/dL — AB (ref 70–99)
Glucose-Capillary: 279 mg/dL — ABNORMAL HIGH (ref 70–99)
Glucose-Capillary: 231 mg/dL — ABNORMAL HIGH (ref 70–99)
Glucose-Capillary: 191 mg/dL — ABNORMAL HIGH (ref 70–99)
Glucose-Capillary: 197 mg/dL — ABNORMAL HIGH (ref 70–99)

## 2017-11-07 LAB — MAGNESIUM: Magnesium: 2.1 mg/dL (ref 1.7–2.4)

## 2017-11-07 LAB — PHOSPHORUS: PHOSPHORUS: 1.8 mg/dL — AB (ref 2.5–4.6)

## 2017-11-07 LAB — HEPARIN LEVEL (UNFRACTIONATED): HEPARIN UNFRACTIONATED: 0.37 [IU]/mL (ref 0.30–0.70)

## 2017-11-07 MED ORDER — ZOLPIDEM TARTRATE 5 MG PO TABS
5.0000 mg | ORAL_TABLET | Freq: Every evening | ORAL | Status: DC | PRN
  Administered 2017-11-07 – 2017-11-11 (×5): 5 mg via ORAL
  Filled 2017-11-07 (×5): qty 1

## 2017-11-07 MED ORDER — TRAZODONE HCL 50 MG PO TABS
50.0000 mg | ORAL_TABLET | Freq: Every evening | ORAL | Status: DC | PRN
  Administered 2017-11-07 – 2017-11-12 (×3): 50 mg via ORAL
  Filled 2017-11-07 (×3): qty 1

## 2017-11-07 MED ORDER — SODIUM CHLORIDE 0.9% FLUSH
3.0000 mL | Freq: Two times a day (BID) | INTRAVENOUS | Status: DC
  Administered 2017-11-07 – 2017-11-12 (×11): 3 mL via INTRAVENOUS

## 2017-11-07 MED ORDER — SODIUM CHLORIDE 0.9 % IV SOLN
INTRAVENOUS | Status: DC
  Administered 2017-11-08 – 2017-11-13 (×4): via INTRAVENOUS

## 2017-11-07 MED ORDER — AMIODARONE HCL 200 MG PO TABS
200.0000 mg | ORAL_TABLET | Freq: Two times a day (BID) | ORAL | Status: DC
  Administered 2017-11-07 – 2017-11-13 (×13): 200 mg via ORAL
  Filled 2017-11-07 (×13): qty 1

## 2017-11-07 MED ORDER — POTASSIUM PHOSPHATES 15 MMOLE/5ML IV SOLN
30.0000 mmol | Freq: Once | INTRAVENOUS | Status: AC
  Administered 2017-11-07: 30 mmol via INTRAVENOUS
  Filled 2017-11-07: qty 10

## 2017-11-07 MED ORDER — SODIUM CHLORIDE 0.9 % IV SOLN: 250.0000 mL | INTRAVENOUS | Status: DC | PRN

## 2017-11-07 MED ORDER — ASPIRIN 81 MG PO CHEW
81.0000 mg | CHEWABLE_TABLET | ORAL | Status: AC
  Filled 2017-11-07: qty 1

## 2017-11-07 MED ORDER — SODIUM CHLORIDE 0.9% FLUSH: 3.0000 mL | INTRAVENOUS | Status: DC | PRN

## 2017-11-07 NOTE — Progress Notes (Addendum)
[facility] - [place] at [facility]   PATIENT NAME: Kristina Archer    MR#:  [id number]  DATE OF BIRTH:  May 15, 1945 Seen at bedside, no shortness of breath and feels better saying that her back is hurting because of bed position, requesting some sleep medicine tonight.. Amiodarone drip. Chief Complaint  Patient presents with  . Back Pain    REVIEW OF SYSTEMS:   Review of Systems  HENT: Negative for hearing loss.    CONSTITUTIONAL: No fever, fatigue or weakness.  EYES: No blurred or double vision.  EARS, NOSE, AND THROAT: No tinnitus or ear pain.  RESPIRATORY: Shortness of breath decreased.Marland Kitchen  CARDIOVASCULAR: No chest pain, orthopnea, edema.  GASTROINTESTINAL: No nausea, vomiting, diarrhea or abdominal pain.  GENITOURINARY: No dysuria, hematuria.  ENDOCRINE: No polyuria, nocturia,  HEMATOLOGY: No anemia, easy bruising or bleeding SKIN: No rash or lesion. MUSCULOSKELETAL: No joint pain or arthritis.   NEUROLOGIC: No tingling, numbness, weakness.  PSYCHIATRY: No anxiety or depression.   DRUG ALLERGIES:   Allergies  Allergen Reactions  . Codeine Nausea Only    VITALS:  Blood pressure 133/68, pulse 78, temperature 98.7 F (37.1 C), temperature source Oral, resp. rate 19, height 5\' 3"  (1.6 m), weight 86.1 kg, SpO2 93 %.  PHYSICAL EXAMINATION:  GENERAL:  72 y.o.-year-old patient lying in the bed with no acute distress.  EYES: Pupils equal, round, reactive to light and accommodation. No scleral icterus. Extraocular muscles intact.  HEENT: Head atraumatic, normocephalic. Oropharynx and nasopharynx clear.  NECK:  Supple, no jugular venous distention. No thyroid enlargement, no tenderness.  LUNGS: clear to  auscultation bilaterally.   No  rales.  CARDIOVASCULAR: S1, S2 regular no murmurs, rubs, or gallops.  ABDOMEN: Soft, nontender, nondistended. Bowel sounds present. No organomegaly or mass.  EXTREMITIES: No pedal edema, cyanosis, or clubbing.   NEUROLOGIC: Cranial nerves II through XII are intact. Muscle strength 5/5 in all extremities. Sensation intact. Gait not checked.  PSYCHIATRIC: The patient is alert and oriented x 3.  SKIN: No obvious rash, lesion, or ulcer.    LABORATORY PANEL:   CBC Recent Labs  Lab 11/07/17 0421  WBC 10.2  HGB 12.1  HCT 36.6  PLT 161   ------------------------------------------------------------------------------------------------------------------  Chemistries  Recent Labs  Lab 11/02/17 1408  11/07/17 0421  NA 134*   < > 143  K 3.6   < > 3.2*  CL 102   < > 105  CO2 21*   < > 28  GLUCOSE 350*   < > 101*  BUN 16   < > 35*  CREATININE 1.28*   < > 1.20*  CALCIUM 9.9   < > 9.7  MG  --    < > 2.1  AST 67*  --   --   ALT 49*  --   --   ALKPHOS 98  --   --   BILITOT 1.5*  --   --    < > = values in this interval not displayed.   ------------------------------------------------------------------------------------------------------------------  Cardiac Enzymes Recent Labs  Lab 11/05/17 0728  TROPONINI 0.66*   ------------------------------------------------------------------------------------------------------------------  RADIOLOGY:  Dg Chest Port 1 View  Result Date: 11/07/2017 CLINICAL DATA:  Atelectasis EXAM: PORTABLE CHEST 1 VIEW COMPARISON:  11/05/2017 FINDINGS: Cardiomegaly with vascular congestion. No confluent opacities, effusions or overt edema. No acute bony abnormality. IMPRESSION: Cardiomegaly, vascular congestion. Electronically Signed   By: Charlett Nose M.D.   On: 11/07/2017 07:52    EKG:   Orders placed  or performed during the hospital encounter of 11/02/17  . ED EKG  . ED EKG  . EKG 12-Lead  . EKG 12-Lead  . EKG 12-Lead  . EKG 12-Lead  . EKG 12-Lead  . EKG 12-Lead  . EKG 12-Lead  . EKG 12-Lead  . EKG 12-Lead  . EKG 12-Lead  . EKG 12-Lead  . EKG 12-Lead  . EKG 12-Lead  . EKG 12-Lead  . EKG 12-Lead  . EKG 12-Lead    ASSESSMENT AND PLAN:   #1  /right ureteral calculus with right-sided hydronephrosis secondary to obstructing calculus: Status post emergency right ureteral stent by urology yesterday patient has been afebrile after the procedure ESBL UTI, patient on meropenem.  Continue meropenem.  #2. non-ST elevation MI, troponin still going up to 0.80, seen by cardiology, continue heparin drip, high intensity statins, transferred to ICU , continue amiodarone drip, continue Coreg, possible cardiac cath  tomorrow.  #3. acute respiratory failure with hypoxia secondary to acute systolic heart failure EF 20% by echo, clinically patient feels better, shortness of breath improved, hypoxia improved, continue IV Lasix.  Hypokalemia, replace potassium.  3 .  Diabetes mellitus type 2:, Uncontrolled: Received steroids' continue sliding scale insulin with coverage, continue Lantus, adjusted the dose today.  #4 anxiety, depression: Resume  Paxil.   Insomnia: Patient is requesting Ambien tonight.  . All the records are reviewed and case discussed with Care Management/Social Workerr. Management plans discussed with the patient, family and they are in agreement.  CODE STATUS: DNR TOTAL TIME TAKING CARE OF THIS PATIENT: 38 minutes coordination of care  More than 50% of time spent in counseling, coordination of care. Patient is too critical to plan for discharge disposition.Marland Kitchen HIGH RISK FOR Cardiopulmonary arrest.  Katha Hamming M.D on 11/07/2017 at 12:44 PM  Between 7am to 6pm - Pager - [phone]  After 6pm go to [email or web address] - password [email or web address] [facility]  [facility]  Office  [phone]  CC: Primary care physician; Corky Downs, MD   Note: This dictation was prepared with Dragon dictation along with smaller phrase technology. Any transcriptional errors that result from this process are unintentional.

## 2017-11-07 NOTE — Progress Notes (Signed)
Name: Kristina Archer MRN: [id number] DOB: 25-Jul-1945     CONSULTATION DATE: 11/02/2017  Subjective & objectives: Tolerating high flow nasal cannula 7 to 8 L/min and remains on heparin drip  PAST MEDICAL HISTORY :   has a past medical history of Anxiety, Cardiomyopathy (HCC), DDD (degenerative disc disease), lumbar, Diverticulosis, GERD (gastroesophageal reflux disease), High blood pressure, High cholesterol, LBBB (left bundle branch block), Nephrolithiasis, Sciatica, and Type II diabetes mellitus (HCC).  has a past surgical history that includes Cholecystectomy; Abdominal hysterectomy; Cystoscopy/ureteroscopy/holmium laser/stent placement (Left, 05/24/2017); Cystoscopy w/ retrogrades (Bilateral, 05/24/2017); and Cystoscopy w/ ureteral stent placement (Right, 11/02/2017). Prior to Admission medications   Medication Sig Start Date End Date Taking? Authorizing Provider  amLODipine-benazepril (LOTREL) 5-10 MG capsule Take 1 capsule by mouth daily.   Yes [provider]  dexlansoprazole (DEXILANT) 60 MG capsule Take 60 mg by mouth daily before breakfast.   Yes [provider]  docusate sodium (COLACE) 100 MG capsule Take 1 capsule (100 mg total) by mouth 2 (two) times daily. 05/24/17  Yes Vanna Scotland, MD  furosemide (LASIX) 20 MG tablet Take 20 mg by mouth daily.   Yes [provider]  gabapentin (NEURONTIN) 100 MG capsule Take 100 mg by mouth 3 (three) times daily as needed (back pain).   Yes [provider]  glimepiride (AMARYL) 2 MG tablet Take 2 mg by mouth daily with breakfast.   Yes [provider]  metFORMIN (GLUCOPHAGE-XR) 500 MG 24 hr tablet Take 500 mg by mouth 2 (two) times daily.   Yes [provider]  Multiple Vitamin (MULTIVITAMIN WITH MINERALS) TABS tablet Take 1 tablet by mouth daily.   Yes [provider]  PARoxetine (PAXIL) 20 MG tablet Take 20 mg by mouth at bedtime.   Yes [provider]  rosuvastatin  (CRESTOR) 5 MG tablet Take 5 mg by mouth daily.  06/25/14  Yes [provider]  sitaGLIPtin (JANUVIA) 100 MG tablet Take 100 mg by mouth daily.   Yes [provider]  HYDROcodone-acetaminophen (NORCO/VICODIN) 5-325 MG tablet Take 1-2 tablets by mouth every 6 (six) hours as needed for moderate pain. Patient not taking: Reported on 11/02/2017 05/24/17   Vanna Scotland, MD  nitrofurantoin, macrocrystal-monohydrate, (MACROBID) 100 MG capsule Take 1 capsule (100 mg total) by mouth every 12 (twelve) hours. Patient not taking: Reported on 11/02/2017 06/12/17   Vanna Scotland, MD  oxybutynin (DITROPAN) 5 MG tablet Take 1 tablet (5 mg total) by mouth every 8 (eight) hours as needed for bladder spasms. Patient not taking: Reported on 11/02/2017 05/24/17   Vanna Scotland, MD  sulfamethoxazole-trimethoprim (BACTRIM DS,SEPTRA DS) 800-160 MG tablet Take 1 tablet by mouth every 12 (twelve) hours. Patient not taking: Reported on 11/02/2017 06/09/17   Vanna Scotland, MD  tamsulosin (FLOMAX) 0.4 MG CAPS capsule Take 1 capsule (0.4 mg total) by mouth daily. Patient not taking: Reported on 11/02/2017 05/24/17   Vanna Scotland, MD   Allergies  Allergen Reactions  . Codeine Nausea Only    FAMILY HISTORY:  family history includes Heart attack (age of onset: 59) in her father. SOCIAL HISTORY:  reports that she quit smoking about 20 years ago. Her smoking use included cigarettes. She has a 22.50 pack-year smoking history. She has never used smokeless tobacco. She reports that she drank alcohol. She reports that she does not use drugs.  REVIEW OF SYSTEMS:   Unable to obtain due to critical illness   VITAL SIGNS: Temp:  [97.9 F (36.6 C)-98.7 F (  37.1 C)] 98.7 F (37.1 C) (10/29 0226) Pulse Rate:  [69-79] 69 (10/29 1600) Resp:  [13-38] 24 (10/29 1600) BP: (115-133)/(62-75) 119/65 (10/29 1600) SpO2:  [88 %-99 %] 94 % (10/29 1600) Weight:  [86.1 kg] 86.1 kg (10/29 0300)   Physical  Examination: Awake,oriented and no focal neurological deficits Onhigh flow nasal 7-8/min,no respiratory distress,able to talk in full sentencesbilateral equal air entry and no wheezes S1 & S2 are audible with no murmur Benign abdominal exam with feeble peristalsis No leg edema   ASSESSMENT / PLAN:  Acute respiratory failure.Improved and tolerating high flow nasal cannula 7% -Monitor work of breathing and ABG.  Atelectasis and pneumonia.Improved left lower airspace disease and pulmonary congestion with a small left pleural effusion -Invanz.MRSA PCR negative -Monitor CXR + CBC + FiO2  Acute exacerbation of COPD versus reactive airway disease -Optimize bronchodilators, tapering systemic steroids and antibiotics  UTI with obstructive uropathy status post the placement of double-J ureter on the right.Urine culture grew E. coli -Invanz  AKI with ATN and sepsis(improved) -Optimize hydration, avoid nephrotoxins, monitor renal panel and urine output  *Elevated troponin with demand versus supply mismatch and baseline coronary artery disease and left bundle branch.  *HFrEF was pulmonary congestion.LVEF 20 to 25% with diffuse hypokinesis -Heparin drip + aspirin + beta-blocker + statin -Diuresis to improve lung compliance -Management as per cardiology and plan for invasive ischemia work-up.  Hypertension -Optimize antihypertensives and monitor  Diabetes mellitus -Glycemic controland taper systemic steroids  Anxiety and restlessness on the BiPAP -Optimize benzodiazepine and consider Precedex.  Anemia -Maintain hemoglobin more than 8 g/dL  Thrombocytopenia(improved) -Monitor platelet counts   Hypokalemia and hypophosphatemia -Replete and monitor electrolytes  DNR  DVT &GI prophylaxis. Continue supportive care  Critical care time 35 minutes

## 2017-11-07 NOTE — Consult Note (Signed)
ANTICOAGULATION CONSULT NOTE   Pharmacy Consult for heparin infusion Indication: chest pain/ACS  Allergies  Allergen Reactions  . Codeine Nausea Only    Patient Measurements: Height: 5\' 3"  (160 cm) Weight: 189 lb 13.1 oz (86.1 kg) IBW/kg (Calculated) : 52.4 Heparin Dosing Weight: 71.7  Vital Signs: Temp: 98.7 F (37.1 C) (10/29 0226) Temp Source: Oral (10/29 0226) BP: 130/75 (10/29 0500) Pulse Rate: 79 (10/29 0500)  Labs: Recent Labs    11/04/17 1047  11/05/17 0728 11/05/17 1552 11/05/17 2315 11/06/17 0439 11/06/17 1232 11/07/17 0421  HGB 11.4*   < > 10.4*  --   --  10.4*  --  12.1  HCT 34.4*   < > 31.2*  --   --  31.6*  --  36.6  PLT 99*   < > 86*  --   --  133*  --  161  APTT 42*  --   --   --   --   --   --   --   LABPROT 16.0*  --   --   --   --   --   --   --   INR 1.29  --   --   --   --   --   --   --   HEPARINUNFRC  --    < > 0.20* 0.20* 0.32 0.32  --  0.37  CREATININE  --    < > 1.24* 1.20*  --   --  1.30* 1.20*  TROPONINI  --   --  0.66*  --   --   --   --   --    < > = values in this interval not displayed.    Estimated Creatinine Clearance: 46 mL/min (A) (by C-G formula based on SCr of 1.2 mg/dL (H)).   Medical History: Past Medical History:  Diagnosis Date  . Anxiety   . Cardiomyopathy (HCC)    a. 10/2017 Echo: EF 20-25%, diff HK, mild MR. Nl RV size.  . DDD (degenerative disc disease), lumbar   . Diverticulosis   . GERD (gastroesophageal reflux disease)   . High blood pressure   . High cholesterol   . LBBB (left bundle branch block)   . Nephrolithiasis   . Sciatica   . Type II diabetes mellitus (HCC)     Medications:  Medications Prior to Admission  Medication Sig Dispense Refill Last Dose  . amLODipine-benazepril (LOTREL) 5-10 MG capsule Take 1 capsule by mouth daily.   11/01/2017 at 0800  . dexlansoprazole (DEXILANT) 60 MG capsule Take 60 mg by mouth daily before breakfast.   11/01/2017 at 0700  . docusate sodium (COLACE) 100 MG  capsule Take 1 capsule (100 mg total) by mouth 2 (two) times daily. 60 capsule 0 Unknown at PRN  . furosemide (LASIX) 20 MG tablet Take 20 mg by mouth daily.   11/01/2017 at 0800  . gabapentin (NEURONTIN) 100 MG capsule Take 100 mg by mouth 3 (three) times daily as needed (back pain).   Unknown at PRN  . glimepiride (AMARYL) 2 MG tablet Take 2 mg by mouth daily with breakfast.   11/01/2017 at 0800  . metFORMIN (GLUCOPHAGE-XR) 500 MG 24 hr tablet Take 500 mg by mouth 2 (two) times daily.   11/01/2017 at 1800  . Multiple Vitamin (MULTIVITAMIN WITH MINERALS) TABS tablet Take 1 tablet by mouth daily.   11/01/2017 at 0800  . PARoxetine (PAXIL) 20 MG tablet Take 20 mg by mouth at  bedtime.   11/01/2017 at 2000  . rosuvastatin (CRESTOR) 5 MG tablet Take 5 mg by mouth daily.    11/01/2017 at 0800  . sitaGLIPtin (JANUVIA) 100 MG tablet Take 100 mg by mouth daily.   11/01/2017 at 0800  . HYDROcodone-acetaminophen (NORCO/VICODIN) 5-325 MG tablet Take 1-2 tablets by mouth every 6 (six) hours as needed for moderate pain. (Patient not taking: Reported on 11/02/2017) 10 tablet 0 Not Taking at Unknown time  . nitrofurantoin, macrocrystal-monohydrate, (MACROBID) 100 MG capsule Take 1 capsule (100 mg total) by mouth every 12 (twelve) hours. (Patient not taking: Reported on 11/02/2017) 20 capsule 0 Not Taking at Unknown time  . oxybutynin (DITROPAN) 5 MG tablet Take 1 tablet (5 mg total) by mouth every 8 (eight) hours as needed for bladder spasms. (Patient not taking: Reported on 11/02/2017) 30 tablet 0 Not Taking at Unknown time  . sulfamethoxazole-trimethoprim (BACTRIM DS,SEPTRA DS) 800-160 MG tablet Take 1 tablet by mouth every 12 (twelve) hours. (Patient not taking: Reported on 11/02/2017) 14 tablet 0 Not Taking at Unknown time  . tamsulosin (FLOMAX) 0.4 MG CAPS capsule Take 1 capsule (0.4 mg total) by mouth daily. (Patient not taking: Reported on 11/02/2017) 30 capsule 0 Not Taking at Unknown time     Assessment: 72 y.o. female with history of HTN, HL, DMII, remote tob abuse, GERD, DDD, obesity, and anxiety, admitted with ureteral stone, complicated by acute systolic heart failure and elevated troponin.  Goal of Therapy:  Heparin level 0.3-0.7 units/ml Monitor platelets by anticoagulation protocol: Yes   Plan:  10/28 @ HL 0.32 therapeutic. Will continue rate at 1250 units/hr and will recheck HL w/ am labs.  10/29 AM heparin level 0.37. Continue current regimen. Recheck heparin level and CBC with tomorrow AM labs.  Fulton Reek, PharmD, BCPS  11/07/17 6:04 AM

## 2017-11-07 NOTE — Progress Notes (Signed)
Pharmacy Electrolyte Monitoring Consult:  Pharmacy consulted to assist in monitoring and replacing electrolytes in this 72 y.o. female admitted on 11/02/2017 with right ureteral stone and CHF.   Patient developed A. Fib with RVR overnight and was placed on amiodarone drip.   Patient is ordered furosemide 40mg  IV Q12hr and potassium PO Daily.  Labs:  Sodium (mmol/L)  Date Value  11/07/2017 143  10/11/2011 143   Potassium (mmol/L)  Date Value  11/07/2017 3.2 (L)  10/11/2011 3.4 (L)   Magnesium (mg/dL)  Date Value  16/10/9602 2.1   Phosphorus (mg/dL)  Date Value  54/09/8117 1.8 (L)   Calcium (mg/dL)  Date Value  14/78/2956 9.7   Calcium, Total (mg/dL)  Date Value  21/30/8657 10.0   Albumin (g/dL)  Date Value  84/69/6295 4.0  10/11/2011 4.3    Assessment/Plan: Patient received potassium phosphorus IV x 1.   Will continue to replace orally as possible in setting of CHF requiring furosemide.   In setting of CHF/Afib, will replace for goal potassium ~ 4,  goal magnesium ~2, and goal phosphorus > 2.5.   Will recheck electrolytes with am labs.  Pharmacy will continue to monitor and adjust per consult.   Rollin Kotowski L 11/07/2017 4:31 PM

## 2017-11-07 NOTE — Progress Notes (Signed)
Progress Note  Patient Name: Kristina Archer Date of Encounter: 11/07/2017  Primary Cardiologist: Julien Nordmann, MD  Subjective   Patient continues to deny rapid heart rate, palpitations, chest pain.  No reported SOB. Does report some R sided flank and back pain.  Continues to diurese - consider transition from IV to po lasix 10/28 net I/O -1427.39mL; net -2238.29 ml for admission Weight 86.2kg  80.1kg  86.1kg  Cr  1.30  1.20 with baseline ~0.92 Hypokalemia - repleting with goal 4.0  Remains SR with heart rates 70-80s Consider transition from IV to po amiodarone Tentative plan for cardiac cath this Wednesday 11/08/17  Inpatient Medications    Scheduled Meds: . aspirin  81 mg Oral Daily  . carvedilol  3.125 mg Oral BID WC  . docusate sodium  100 mg Oral BID  . furosemide  40 mg Intravenous BID  . insulin aspart  0-15 Units Subcutaneous Q4H  . insulin glargine  10 Units Subcutaneous QHS  . ipratropium-albuterol  3 mL Nebulization Q6H  . linagliptin  5 mg Oral Daily  . mouth rinse  15 mL Mouth Rinse BID  . pantoprazole  40 mg Oral Daily  . PARoxetine  20 mg Oral QHS  . polyethylene glycol  17 g Oral Daily  . potassium chloride  20 mEq Oral Daily  . rosuvastatin  40 mg Oral Daily   Continuous Infusions: . amiodarone 30 mg/hr (11/07/17 0700)  . ertapenem Stopped (11/06/17 1834)  . heparin 1,250 Units/hr (11/07/17 0936)   PRN Meds: acetaminophen **OR** acetaminophen, bisacodyl, levalbuterol, LORazepam, ondansetron **OR** ondansetron (ZOFRAN) IV, traZODone, zolpidem   Vital Signs    Vitals:   11/07/17 0400 11/07/17 0500 11/07/17 0600 11/07/17 0700  BP: 115/67 130/75 130/62 133/68  Pulse: 76 79 79 78  Resp: (!) 29 (!) 33 (!) 38 19  Temp:      TempSrc:      SpO2: 92% 95% 91% 93%  Weight:      Height:        Intake/Output Summary (Last 24 hours) at 11/07/2017 1253 Last data filed at 11/07/2017 1200 Gross per 24 hour  Intake 1186.71 ml  Output 3425 ml  Net  -2238.29 ml   Filed Weights   11/05/17 0500 11/06/17 0414 11/07/17 0300  Weight: 86 kg 80.1 kg 86.1 kg    Telemetry    SR, 70-80s bpm- Personally Reviewed  ECG    No new tracing.  Physical Exam   GEN: No acute distress.   Neck:  JVP difficult to assess but likely slightly elevated. Cardiac:  Distant heart sounds.  Regular rate and rhythm without murmurs. Respiratory: Breath remain slightly diminished at the bases. GI: Obese, Soft, nontender, non-distended  MS:  Trace/ trivial  bl LEE; No deformity. Neuro:  Nonfocal  Psych: Normal affect.  Labs    Chemistry Recent Labs  Lab 11/02/17 1408  11/05/17 1552 11/06/17 1232 11/07/17 0421  NA 134*   < > 136 137 143  K 3.6   < > 3.8 3.5 3.2*  CL 102   < > 102 101 105  CO2 21*   < > 25 25 28   GLUCOSE 350*   < > 453* 371* 101*  BUN 16   < > 41* 40* 35*  CREATININE 1.28*   < > 1.20* 1.30* 1.20*  CALCIUM 9.9   < > 9.7 9.8 9.7  PROT 7.4  --   --   --   --   ALBUMIN 4.0  --   --   --   --  AST 67*  --   --   --   --   ALT 49*  --   --   --   --   ALKPHOS 98  --   --   --   --   BILITOT 1.5*  --   --   --   --   GFRNONAA 42*   < > 45* 41* 45*  GFRAA 48*   < > 52* 47* 52*  ANIONGAP 11   < > 9 11 10    < > = values in this interval not displayed.     Hematology Recent Labs  Lab 11/05/17 0728 11/06/17 0439 11/07/17 0421  WBC 5.6 7.3 10.2  RBC 3.65* 3.75* 4.36  HGB 10.4* 10.4* 12.1  HCT 31.2* 31.6* 36.6  MCV 85.5 84.3 83.9  MCH 28.5 27.7 27.8  MCHC 33.3 32.9 33.1  RDW 14.4 14.1 14.1  PLT 86* 133* 161    Cardiac Enzymes Recent Labs  Lab 11/03/17 1801 11/03/17 2315 11/04/17 0408 11/05/17 0728  TROPONINI 0.27* 0.71* 0.80* 0.66*   No results for input(s): TROPIPOC in the last 168 hours.   BNP Recent Labs  Lab 11/03/17 1801  BNP 862.0*     DDimer No results for input(s): DDIMER in the last 168 hours.   Radiology    Dg Chest Port 1 View  Result Date: 11/07/2017 CLINICAL DATA:  Atelectasis EXAM:  PORTABLE CHEST 1 VIEW COMPARISON:  11/05/2017 FINDINGS: Cardiomegaly with vascular congestion. No confluent opacities, effusions or overt edema. No acute bony abnormality. IMPRESSION: Cardiomegaly, vascular congestion. Electronically Signed   By: Charlett Nose M.D.   On: 11/07/2017 07:52    Cardiac Studies   Echocardiogram (11/03/17): - Left ventricle: The cavity size was mildly dilated. Systolic function was severely reduced. The estimated ejection fraction was in the range of 20% to 25%. Diffuse hypokinesis. Regional wall motion abnormalities cannot be excluded. The study is not technically sufficient to allow evaluation of LV diastolic function. - Mitral valve: There was mild regurgitation. - Left atrium: The atrium was normal in size. - Right ventricle: Systolic function was normal. - Pulmonary arteries: Systolic pressure could not be accurately estimated.  Patient Profile     72 y.o. female with history of HTN, HL, DMII, remote tob abuse, GERD, DDD, obesity, and anxiety, admitted with ureteral stone, complicated by acute systolic heart failure and elevated troponin.  Assessment & Plan    Acute respiratory failure with hypoxia and acute systolic heart failure In the setting of likely COPD exacerbation.  EF 20-25% with echo above  Diuresing with 10/28 net I/O -1427.5. Weight increased 80.1kg  86.1kg per documentation. Cr 10/29 is 1.20 with baseline estimated 0.92.  Repleting potassium as below.   Consider updated BNP  Consider transition of furosemide 40 mg IV twice daily to oral lasix. Continue carvedilol 3.125 mg twice daily. After cardiac cath, consider addition of ACE inhibitor or ARB if renal function and BP allows. Continue ASA, BB, statin, heparin. Continue amiodarone with previous short run of Afib earlier this week 10/27, currently SR. Consider transition from IV to po amiodarone. Continue COPD treatment per medicine and critical care. Wean oxygen, as  tolerated.   Demand ischemia No current chest pain, improved SOB.  Cannot r/o underlying CAD contributing to cardiomyopathy at this time. Troponin peaked 0.80 and trending down, elevated in the setting of demand ischemia 0.29  0.71  0.80  0.66  Continue aspirin, carvedilol, and rosuvastatin. Consider addition of ACE/ARB after cardiac  cath if renal function and BP allows. Continue heparin. Continue amiodarone with Afib noted 10/27  -converted back to sinus rhythm 10/28 and remains in SR today.   Diuresing with plan for L/R heart cardiac cath this Wednesday   Acute kidney injury Improved since admission. Consider multifactorial etiology including acute decompensated heart failure complicated by recent ureteral stone.  Daily BMP, continue diuresis. Monitor I/O, daily weights. Caution with nephrotoxic agents.  Recommend continue to hold metformin in preparation for cardiac cath; hold metformin 48h following cath.  Hypokalemia - K 1.9  3.2. Recommend continue to replete with goal 4.0 - Mg 2.1 (10/29) - Daily BMET  Anemia - Hgb 10.4  12.1 - Monitor as on heparin. Per IM  Ureteral stone Asymptomatic at this time following ureteral stent placement by urology.  Per urology and internal medicine.  DM2 - SSI - Hold metformin as above before and after cath - Per IM  For questions or updates, please contact [facility] Please consult [email or web address] for contact info under [facility].  Signed, Lennon Alstrom, PA-C  11/07/2017, 12:53 PM

## 2017-11-08 ENCOUNTER — Encounter
Admission: EM | Disposition: A | Discharge status: Home Health | Payer: Self-pay | Source: Home / Self Care | Attending: Internal Medicine

## 2017-11-08 DIAGNOSIS — R7989 Other specified abnormal findings of blood chemistry: Secondary | ICD-10-CM

## 2017-11-08 LAB — BASIC METABOLIC PANEL
Anion gap: 9 (ref 5–15)
BUN: 24 mg/dL — AB (ref 8–23)
CHLORIDE: 103 mmol/L (ref 98–111)
CO2: 29 mmol/L (ref 22–32)
CREATININE: 1.03 mg/dL — AB (ref 0.44–1.00)
Calcium: 9.1 mg/dL (ref 8.9–10.3)
GFR calc Af Amer: >60 mL/min (ref >60)
GFR calc non Af Amer: 54 mL/min — ABNORMAL LOW (ref >60)
GLUCOSE: 123 mg/dL — AB (ref 70–99)
POTASSIUM: 3.1 mmol/L — AB (ref 3.5–5.1)
SODIUM: 141 mmol/L (ref 135–145)

## 2017-11-08 LAB — CBC
HCT: 34.9 % — ABNORMAL LOW (ref 36.0–46.0)
HEMOGLOBIN: 11.7 g/dL — AB (ref 12.0–15.0)
MCH: 27.7 pg (ref 26.0–34.0)
MCHC: 33.5 g/dL (ref 30.0–36.0)
MCV: 82.7 fL (ref 80.0–100.0)
Platelets: 159 10*3/uL (ref 150–400)
RBC: 4.22 MIL/uL (ref 3.87–5.11)
RDW: 14.4 % (ref 11.5–15.5)
WBC: 9.7 10*3/uL (ref 4.0–10.5)
nRBC: 0.2 % (ref 0.0–0.2)

## 2017-11-08 LAB — GLUCOSE, CAPILLARY
GLUCOSE-CAPILLARY: 222 mg/dL — AB (ref 70–99)
GLUCOSE-CAPILLARY: 269 mg/dL — AB (ref 70–99)
Glucose-Capillary: 121 mg/dL — ABNORMAL HIGH (ref 70–99)
Glucose-Capillary: 108 mg/dL — ABNORMAL HIGH (ref 70–99)
Glucose-Capillary: 233 mg/dL — ABNORMAL HIGH (ref 70–99)

## 2017-11-08 LAB — MAGNESIUM: Magnesium: 2.1 mg/dL (ref 1.7–2.4)

## 2017-11-08 LAB — HEPARIN LEVEL (UNFRACTIONATED): HEPARIN UNFRACTIONATED: 0.33 [IU]/mL (ref 0.30–0.70)

## 2017-11-08 LAB — PHOSPHORUS: Phosphorus: 3.1 mg/dL (ref 2.5–4.6)

## 2017-11-08 SURGERY — RIGHT/LEFT HEART CATH AND CORONARY ANGIOGRAPHY
Anesthesia: Moderate Sedation

## 2017-11-08 MED ORDER — INSULIN ASPART 100 UNIT/ML ~~LOC~~ SOLN
0.0000 [IU] | Freq: Three times a day (TID) | SUBCUTANEOUS | Status: DC
  Administered 2017-11-08: 5 [IU] via SUBCUTANEOUS
  Administered 2017-11-08: 8 [IU] via SUBCUTANEOUS
  Administered 2017-11-09: 2 [IU] via SUBCUTANEOUS
  Administered 2017-11-09 (×2): 3 [IU] via SUBCUTANEOUS
  Administered 2017-11-10: 2 [IU] via SUBCUTANEOUS
  Administered 2017-11-10: 8 [IU] via SUBCUTANEOUS
  Administered 2017-11-10: 5 [IU] via SUBCUTANEOUS
  Administered 2017-11-11 (×2): 3 [IU] via SUBCUTANEOUS
  Administered 2017-11-11: 2 [IU] via SUBCUTANEOUS
  Administered 2017-11-12: 8 [IU] via SUBCUTANEOUS
  Administered 2017-11-12: 3 [IU] via SUBCUTANEOUS
  Administered 2017-11-12: 8 [IU] via SUBCUTANEOUS
  Administered 2017-11-13: 3 [IU] via SUBCUTANEOUS
  Filled 2017-11-08 (×15): qty 1

## 2017-11-08 MED ORDER — FUROSEMIDE 40 MG PO TABS
40.0000 mg | ORAL_TABLET | Freq: Two times a day (BID) | ORAL | Status: DC
  Administered 2017-11-08 – 2017-11-09 (×2): 40 mg via ORAL
  Filled 2017-11-08 (×2): qty 1

## 2017-11-08 MED ORDER — POTASSIUM CHLORIDE CRYS ER 20 MEQ PO TBCR
20.0000 meq | EXTENDED_RELEASE_TABLET | Freq: Two times a day (BID) | ORAL | Status: DC
  Administered 2017-11-08: 20 meq via ORAL
  Filled 2017-11-08: qty 1

## 2017-11-08 MED ORDER — IPRATROPIUM-ALBUTEROL 0.5-2.5 (3) MG/3ML IN SOLN
3.0000 mL | Freq: Three times a day (TID) | RESPIRATORY_TRACT | Status: DC
  Administered 2017-11-09 – 2017-11-11 (×6): 3 mL via RESPIRATORY_TRACT
  Filled 2017-11-08 (×8): qty 3

## 2017-11-08 MED ORDER — POTASSIUM CHLORIDE CRYS ER 20 MEQ PO TBCR
40.0000 meq | EXTENDED_RELEASE_TABLET | Freq: Once | ORAL | Status: AC
  Administered 2017-11-08: 40 meq via ORAL
  Filled 2017-11-08: qty 2

## 2017-11-08 MED ORDER — FUROSEMIDE 20 MG PO TABS
20.0000 mg | ORAL_TABLET | Freq: Two times a day (BID) | ORAL | Status: DC
  Administered 2017-11-08: 20 mg via ORAL
  Filled 2017-11-08: qty 1

## 2017-11-08 MED ORDER — LISINOPRIL 5 MG PO TABS
2.5000 mg | ORAL_TABLET | Freq: Every day | ORAL | Status: DC
  Administered 2017-11-08 – 2017-11-13 (×6): 2.5 mg via ORAL
  Filled 2017-11-08 (×6): qty 1

## 2017-11-08 MED ORDER — INSULIN GLARGINE 100 UNIT/ML ~~LOC~~ SOLN
14.0000 [IU] | Freq: Every day | SUBCUTANEOUS | Status: DC
  Administered 2017-11-08 – 2017-11-11 (×4): 14 [IU] via SUBCUTANEOUS
  Filled 2017-11-08 (×5): qty 0.14

## 2017-11-08 NOTE — Progress Notes (Addendum)
Pharmacy Electrolyte Monitoring Consult:  Pharmacy consulted to assist in monitoring and replacing electrolytes in this 72 y.o. female admitted on 11/02/2017 with right ureteral stone and CHF.   Patient developed A. Fib with RVR and was placed on amiodarone drip.   Patient is ordered furosemide 20mg  PO Q12hr and potassium PO Daily.  Labs:  Sodium (mmol/L)  Date Value  11/08/2017 141  10/11/2011 143   Potassium (mmol/L)  Date Value  11/08/2017 3.1 (L)  10/11/2011 3.4 (L)   Magnesium (mg/dL)  Date Value  16/10/9602 2.1   Phosphorus (mg/dL)  Date Value  54/09/8117 3.1   Calcium (mg/dL)  Date Value  14/78/2956 9.1   Calcium, Total (mg/dL)  Date Value  21/30/8657 10.0   Albumin (g/dL)  Date Value  84/69/6295 4.0  10/11/2011 4.3    Assessment/Plan: Will increase KCl PO daily to BID for CHF requiring furosemide. Will order additional KCl PO once.  In setting of CHF/Afib, will replace for goal potassium ~ 4,  goal magnesium ~2, and goal phosphorus > 2.5.   Will recheck electrolytes with am labs.  Pharmacy will continue to monitor and adjust per consult.   Clovia Cuff, PharmD, BCPS 11/08/2017 9:11 AM

## 2017-11-08 NOTE — Care Management (Signed)
RNCM to patients room to assess.  PT is currently working with patient.

## 2017-11-08 NOTE — Consult Note (Signed)
ANTICOAGULATION CONSULT NOTE  Pharmacy Consult for heparin infusion Indication: chest pain/ACS  Allergies  Allergen Reactions  . Codeine Nausea Only    Patient Measurements: Height: 5\' 2"  (157.5 cm) Weight: 192 lb 10.9 oz (87.4 kg) IBW/kg (Calculated) : 50.1 Heparin Dosing Weight: 71.7  Vital Signs: Temp: 99.1 F (37.3 C) (10/30 0404) Temp Source: Oral (10/30 0404) BP: 125/60 (10/30 0404) Pulse Rate: 79 (10/30 0404)  Labs: Recent Labs    11/05/17 0728  11/06/17 0439 11/06/17 1232 11/07/17 0421 11/08/17 0620  HGB 10.4*  --  10.4*  --  12.1 11.7*  HCT 31.2*  --  31.6*  --  36.6 34.9*  PLT 86*  --  133*  --  161 159  HEPARINUNFRC 0.20*   < > 0.32  --  0.37 0.33  CREATININE 1.24*   < >  --  1.30* 1.20* 1.03*  TROPONINI 0.66*  --   --   --   --   --    < > = values in this interval not displayed.    Estimated Creatinine Clearance: 52.9 mL/min (A) (by C-G formula based on SCr of 1.03 mg/dL (H)).   Medical History: Past Medical History:  Diagnosis Date  . Anxiety   . Cardiomyopathy (HCC)    a. 10/2017 Echo: EF 20-25%, diff HK, mild MR. Nl RV size.  . DDD (degenerative disc disease), lumbar   . Diverticulosis   . GERD (gastroesophageal reflux disease)   . High blood pressure   . High cholesterol   . LBBB (left bundle branch block)   . Nephrolithiasis   . Sciatica   . Type II diabetes mellitus (HCC)    Assessment: 72 y.o. female with history of HTN, HL, DMII, remote tob abuse, GERD, DDD, obesity, and anxiety, admitted with ureteral stone, complicated by acute systolic heart failure and elevated troponin. Hgb fairly stable at 11.7   Heparin Course:  Initiation: 10/26: bolus held, infusion started at 850 units/hr 10/26 1700 HL 0.39 10/26 2300 HL 0.37 10/27 0728 HL 0.20 bolus of 1100 units, drip to 1000 units/hr 10/27 1552 HL 0.20 bolus of 1100 units, drip to 1250 units/hr 10/27 2300 HL 0.32 10/28 0645 HL 0.32 10/29 0605 HL 0.37 10/30 0620 HL  0.33    Goal of Therapy:  Heparin level 0.3-0.7 units/ml Monitor platelets by anticoagulation protocol: Yes   Plan:  Continue current regimen. Recheck heparin level and CBC with tomorrow AM labs.  Lowella Bandy, PharmD 11/08/2017,7:15 AM

## 2017-11-08 NOTE — Progress Notes (Signed)
Progress Note  Patient Name: Kristina Archer Date of Encounter: 11/08/2017  Primary Cardiologist: Mariah Milling  Subjective   Transferred out of the ICU to telemetry.  Patient notified staff on the evening of 10/29 she did not want to proceed with cardiac catheterization this morning without discussing this with her PCP, Dr. Harl Bowie.  This morning on rounds, she continues to decline cardiac catheterization stating that she wants to discuss her case with her PCP.  She understands the risk places her at increased risk and will lead to a longer admission.  She remains on supplemental oxygen via nasal cannula at 4 L.  She denies any chest pain.  Potassium remains low at 3.1.  Renal function improved from 1.2--> 1.03.  She remains on heparin infusion.  Inpatient Medications    Scheduled Meds: . amiodarone  200 mg Oral BID  . aspirin  81 mg Oral Daily  . aspirin  81 mg Oral Pre-Cath  . carvedilol  3.125 mg Oral BID WC  . docusate sodium  100 mg Oral BID  . furosemide  20 mg Oral BID  . insulin aspart  0-15 Units Subcutaneous TID WC  . insulin glargine  10 Units Subcutaneous QHS  . ipratropium-albuterol  3 mL Nebulization Q6H  . linagliptin  5 mg Oral Daily  . lisinopril  2.5 mg Oral Daily  . mouth rinse  15 mL Mouth Rinse BID  . pantoprazole  40 mg Oral Daily  . PARoxetine  20 mg Oral QHS  . polyethylene glycol  17 g Oral Daily  . potassium chloride  20 mEq Oral BID  . rosuvastatin  40 mg Oral Daily  . sodium chloride flush  3 mL Intravenous Q12H   Continuous Infusions: . sodium chloride    . sodium chloride 10 mL/hr at 11/08/17 0628  . ertapenem Stopped (11/07/17 1716)  . heparin 1,250 Units/hr (11/08/17 0634)   PRN Meds: sodium chloride, acetaminophen **OR** acetaminophen, bisacodyl, levalbuterol, LORazepam, ondansetron **OR** ondansetron (ZOFRAN) IV, sodium chloride flush, traZODone, zolpidem   Vital Signs    Vitals:   11/08/17 0240 11/08/17 0404 11/08/17 0745 11/08/17 0754    BP:  125/60 123/60   Pulse:  79 79   Resp:  (!) 28 18   Temp:  99.1 F (37.3 C) 98.2 F (36.8 C)   TempSrc:  Oral Oral   SpO2: 92% 91% 95% 93%  Weight:  87.4 kg    Height:        Intake/Output Summary (Last 24 hours) at 11/08/2017 1003 Last data filed at 11/08/2017 0628 Gross per 24 hour  Intake 532.86 ml  Output 3100 ml  Net -2567.14 ml   Filed Weights   11/07/17 0300 11/07/17 2244 11/08/17 0404  Weight: 86.1 kg 87.3 kg 87.4 kg    Telemetry    NSR vs WAP vs coarse Afib - Personally Reviewed  ECG    n/a - Personally Reviewed  Physical Exam   GEN: No acute distress.   Neck: JVD mildly elevated. Cardiac:  Irregular, no murmurs, rubs, or gallops.  Respiratory:  Diminished breath sounds bilaterally with crackles along the bilateral bases.  GI: Soft, nontender, non-distended.   MS: No edema; No deformity. Neuro:  Alert and oriented x 3; Nonfocal.  Psych: Normal affect.  Labs    Chemistry Recent Labs  Lab 11/02/17 1408  11/06/17 1232 11/07/17 0421 11/08/17 0620  NA 134*   < > 137 143 141  K 3.6   < > 3.5 3.2*  3.1*  CL 102   < > 101 105 103  CO2 21*   < > 25 28 29   GLUCOSE 350*   < > 371* 101* 123*  BUN 16   < > 40* 35* 24*  CREATININE 1.28*   < > 1.30* 1.20* 1.03*  CALCIUM 9.9   < > 9.8 9.7 9.1  PROT 7.4  --   --   --   --   ALBUMIN 4.0  --   --   --   --   AST 67*  --   --   --   --   ALT 49*  --   --   --   --   ALKPHOS 98  --   --   --   --   BILITOT 1.5*  --   --   --   --   GFRNONAA 42*   < > 41* 45* 54*  GFRAA 48*   < > 47* 52* >60  ANIONGAP 11   < > 11 10 9    < > = values in this interval not displayed.     Hematology Recent Labs  Lab 11/06/17 0439 11/07/17 0421 11/08/17 0620  WBC 7.3 10.2 9.7  RBC 3.75* 4.36 4.22  HGB 10.4* 12.1 11.7*  HCT 31.6* 36.6 34.9*  MCV 84.3 83.9 82.7  MCH 27.7 27.8 27.7  MCHC 32.9 33.1 33.5  RDW 14.1 14.1 14.4  PLT 133* 161 159    Cardiac Enzymes Recent Labs  Lab 11/03/17 1801 11/03/17 2315  11/04/17 0408 11/05/17 0728  TROPONINI 0.27* 0.71* 0.80* 0.66*   No results for input(s): TROPIPOC in the last 168 hours.   BNP Recent Labs  Lab 11/03/17 1801  BNP 862.0*     DDimer No results for input(s): DDIMER in the last 168 hours.   Radiology    Dg Chest Port 1 View  Result Date: 11/07/2017 IMPRESSION: Cardiomegaly, vascular congestion. Electronically Signed   By: Charlett Nose M.D.   On: 11/07/2017 07:52    Cardiac Studies   Study Conclusions  - Left ventricle: The cavity size was mildly dilated. Systolic   function was severely reduced. The estimated ejection fraction   was in the range of 20% to 25%. Diffuse hypokinesis. Regional   wall motion abnormalities cannot be excluded. The study is not   technically sufficient to allow evaluation of LV diastolic   function. - Mitral valve: There was mild regurgitation. - Left atrium: The atrium was normal in size. - Right ventricle: Systolic function was normal. - Pulmonary arteries: Systolic pressure could not be accurately   estimated.  Impressions:  - Rhythm is wide complex tachycardia rate 129 bpm.  Patient Profile     72 y.o. female with history of HTN, HL, DMII, remote tob abuse, GERD, DDD, obesity, and anxiety, admitted with obstructing right sided ureteral stone with hydronephrosis status post emergent right ureteral stenting by urology, complicated by acute systolic heart failure and elevated troponin.   Assessment & Plan    1.  Acute systolic CHF: -She continues to appear mildly volume overloaded -Continue diuresis with Lasix -Initially, she was for right and left heart catheterization as detailed below which has been deferred at her request.  We will plan to proceed with cardiac catheterization if she agrees after talking with her PCP at her request -Continue carvedilol, and lisinopril -Add spironolactone prior to discharge given her cardiomyopathy -CHF education -Daily weights with strict I's  and  2.  Elevated troponin: -No chest pain -Shortness of breath improving -Troponin peaked at 0.8 -Strong suspicion for underlying coronary artery disease contributing to her cardiomyopathy as noted above -Initially, she was scheduled for a right and left cardiac catheterization this morning however the patient indicated to staff and 2-hour overnight cardiologist on 10/29 that she wanted to delay this as she wanted to discuss the case with her PCP.  This morning on rounds, the patient continues to decline cardiac catheterization stating she wants to discuss this further with her PCP.  She has been made aware of the risks of this and accepts them.  We will await her response after discussing her case with her PCP.  We will make her n.p.o. at midnight again in case she decides to proceed with cardiac catheterization on 10/31 -Remains on heparin drip as below -Continue carvedilol, Crestor, and lisinopril -ASA  3.  Transient A. Fib with RVR: -Responded well to IV amiodarone -Appears to have been maintaining sinus rhythm though cannot exclude wandering atrial pacemaker versus coarse A. Fib -Remains on heparin drip -Once it is clear that she will not require any further invasive evaluation, we recommend long-term, full dose oral anticoagulation with Eliquis or Xarelto -Continue p.o. amiodarone -Obtain twelve-lead EKG this morning  4.  AKI: -Renal function improving  5.  Anemia: -Hemoglobin low though stable -Per IM -Monitor on heparin drip  6.  Ureteral stone: -Per IM  7.  Mild thrombocytopenia -Improved this morning to 159 -Monitor closely for possible HIT  For questions or updates, please contact [facility] Please consult [email or web address] for contact info under Cardiology/STEMI.    Signed, Eula Listen, PA-C [facility] Pager: [phone] 11/08/2017, 10:03 AM

## 2017-11-08 NOTE — Progress Notes (Signed)
[facility] - [place] at [facility]   PATIENT NAME: Kristina Archer    MR#:  [id number]  DATE OF BIRTH:  Jul 26, 1945  Patient is transferred from ICU, refusing cardiac cath until she speaks to her PCP.  Eager to get up with physical therapy.  Denies any other complaints. Chief Complaint  Patient presents with  . Back Pain    REVIEW OF SYSTEMS:   Review of Systems  HENT: Negative for hearing loss.    CONSTITUTIONAL: No fever, fatigue or weakness.  EYES: No blurred or double vision.  EARS, NOSE, AND THROAT: No tinnitus or ear pain.  RESPIRATORY: Shortness of breath decreased.Marland Kitchen  CARDIOVASCULAR: No chest pain, orthopnea, edema.  GASTROINTESTINAL: No nausea, vomiting, diarrhea or abdominal pain.  GENITOURINARY: No dysuria, hematuria.  ENDOCRINE: No polyuria, nocturia,  HEMATOLOGY: No anemia, easy bruising or bleeding SKIN: No rash or lesion. MUSCULOSKELETAL: No joint pain or arthritis.   NEUROLOGIC: No tingling, numbness, weakness.  PSYCHIATRY: No anxiety or depression.   DRUG ALLERGIES:   Allergies  Allergen Reactions  . Codeine Nausea Only    VITALS:  Blood pressure 123/60, pulse 79, temperature 98.2 F (36.8 C), temperature source Oral, resp. rate 18, height 5\' 2"  (1.575 m), weight 87.4 kg, SpO2 93 %.  PHYSICAL EXAMINATION:  GENERAL:  72 y.o.-year-old patient lying in the bed with no acute distress.  EYES: Pupils equal, round, reactive to light and accommodation. No scleral icterus. Extraocular muscles intact.  HEENT: Head atraumatic, normocephalic. Oropharynx and nasopharynx clear.  NECK:  Supple, no jugular venous distention. No thyroid enlargement, no tenderness.  LUNGS: clear to  auscultation bilaterally.   No  rales.  CARDIOVASCULAR: S1, S2 regular no murmurs, rubs, or gallops.  ABDOMEN: Soft, nontender, nondistended. Bowel sounds present. No organomegaly or mass.  EXTREMITIES: No pedal edema, cyanosis, or clubbing.  NEUROLOGIC: Cranial  nerves II through XII are intact. Muscle strength 5/5 in all extremities. Sensation intact. Gait not checked.  PSYCHIATRIC: The patient is alert and oriented x 3.  SKIN: No obvious rash, lesion, or ulcer.    LABORATORY PANEL:   CBC Recent Labs  Lab 11/08/17 0620  WBC 9.7  HGB 11.7*  HCT 34.9*  PLT 159   ------------------------------------------------------------------------------------------------------------------  Chemistries  Recent Labs  Lab 11/02/17 1408  11/08/17 0620  NA 134*   < > 141  K 3.6   < > 3.1*  CL 102   < > 103  CO2 21*   < > 29  GLUCOSE 350*   < > 123*  BUN 16   < > 24*  CREATININE 1.28*   < > 1.03*  CALCIUM 9.9   < > 9.1  MG  --    < > 2.1  AST 67*  --   --   ALT 49*  --   --   ALKPHOS 98  --   --   BILITOT 1.5*  --   --    < > = values in this interval not displayed.   ------------------------------------------------------------------------------------------------------------------  Cardiac Enzymes Recent Labs  Lab 11/05/17 0728  TROPONINI 0.66*   ------------------------------------------------------------------------------------------------------------------  RADIOLOGY:  Dg Chest Port 1 View  Result Date: 11/07/2017 CLINICAL DATA:  Atelectasis EXAM: PORTABLE CHEST 1 VIEW COMPARISON:  11/05/2017 FINDINGS: Cardiomegaly with vascular congestion. No confluent opacities, effusions or overt edema. No acute bony abnormality. IMPRESSION: Cardiomegaly, vascular congestion. Electronically Signed   By: Charlett Nose M.D.   On: 11/07/2017 07:52    EKG:   Orders  placed or performed during the hospital encounter of 11/02/17  . ED EKG  . ED EKG  . EKG 12-Lead  . EKG 12-Lead  . EKG 12-Lead  . EKG 12-Lead  . EKG 12-Lead  . EKG 12-Lead  . EKG 12-Lead  . EKG 12-Lead  . EKG 12-Lead  . EKG 12-Lead  . EKG 12-Lead  . EKG 12-Lead  . EKG 12-Lead  . EKG 12-Lead  . EKG 12-Lead  . EKG 12-Lead    ASSESSMENT AND PLAN:   #1 /right ureteral  calculus with right-sided hydronephrosis secondary to obstructing calculus: Status post emergency right ureteral stent by urology .patient has been afebrile after the procedure ESBL UTI, patient on meropenem.  Continue meropenem.  #2. non-ST elevation MI, troponin peaked up to 0.80.  Seen by cardiology, continue heparin drip, high intensity statins,, c continue beta-blockers, patient is refusing cardiac cath.  Continue heparin drip for now.   #3. acute respiratory failure with hypoxia secondary to acute systolic heart failure EF 20% by echo, clinically patient feels better, shortness of breath improved, hypoxia improved, continue IV Lasix.  Hypokalemia, replace potassium.on 4litres [place].  Continue beta-blockers, ACE inhibitors, Lasix  3 .  Diabetes mellitus type 2:, Uncontrolled: Received steroids' continue sliding scale insulin with coverage, continue Lantus, adjusted the dose today.  #4 anxiety, depression: Resume  Paxil.  #5 atrial fibrillation, rate controlled low, discontinue amiodarone drip, patient is on p.o. amiodarone 200 mg p.o. twice daily, on heparin drip at this time.   Insomnia: Patient is requesting Ambien tonight.  . All the records are reviewed and case discussed with Care Management/Social Workerr. Management plans discussed with the patient, family and they are in agreement.  CODE STATUS: DNR TOTAL TIME TAKING CARE OF THIS PATIENT: 38 minutes coordination of care  More than 50% of time spent in counseling, coordination of care. Patient is too critical to plan for discharge disposition.Marland Kitchen HIGH RISK FOR Cardiopulmonary arrest.  Katha Hamming M.D on 11/08/2017 at 1:02 PM  Between 7am to 6pm - Pager - [phone]  After 6pm go to [email or web address] - password [email or web address] [facility]  [facility]  Office  [phone]  CC: Primary care physician; Corky Downs, MD   Note: This dictation was prepared with Dragon dictation along with smaller phrase technology.  Any transcriptional errors that result from this process are unintentional.

## 2017-11-08 NOTE — Evaluation (Signed)
Physical Therapy Evaluation Patient Details Name: Kristina Archer MRN: [id number] DOB: 1945/08/20 Today's Date: 11/08/2017   History of Present Illness  Pt is a 72 y.o. female with a known history of essential hypertension, diabetes mellitus type 2, came to hospital with right flank pain.  Patient had severe right flank pain associated with nausea.  Found to have noted right hydronephrosis with 10x 13 mm stone at right UPJ.  Cystoscopy with right retrograde pyelogram and right ureteral stent placement performed 11/02/17.  Cardiology consulted secondary to Tachycardia, respiratory distress, and cardiomyopathy.  On 10/25 pt found to by hypoxic requiring 4LO2/min with chest xray showing acute pulmonary edema with HR 140 bpm, elevated troponin, and EF 20%.  Cardiology recommending cardiac catheterization with pt refusing procedure.  Assessment includes: right ureteral calculus with right-sided hydronephrosis secondary to obstructing calculus s/p emergency right ureteral stent by urology, non-ST elevation MI, troponin peaked up to 0.80, acute respiratory failure with hypoxia secondary to acute systolic heart failure EF 20% by echo, DM II, anxiety, A-fib, and insomnia.      Clinical Impression  Upon entering room pt reported feeling very cold and was visibly shaking with some difficulty speaking and had just received a heated blanket.  Nursing in room with vitals and temp taken all WNL.  Pt gradually warmed up and was able to communicate clearly and participate with PT services.  Pt presents with deficits in strength, transfers, mobility, gait, balance, and activity tolerance.  Pt required extensive assistance and effort during sup to/from sit bed mobility tasks.  Pt was able to stand from an elevated EOB with Mod A and was able to take several very small, shuffling steps before requiring to return to sitting.  Pt attempted to stand again several times but was unsuccessful.  Pt reported "Never felt this weak in  my life".  Pt's SpO2 and HR WNL during the session on 6LO2/min.  Pt will benefit from PT services in a SNF setting upon discharge to safely address above deficits for decreased caregiver assistance and eventual return to PLOF.        Follow Up Recommendations SNF;Supervision for mobility/OOB    Equipment Recommendations  Rolling walker with 5" wheels;Other (comment)(TBD at next venue of care if discharges to a SNF)    Recommendations for Other Services       Precautions / Restrictions Precautions Precautions: Fall Restrictions Weight Bearing Restrictions: No      Mobility  Bed Mobility Overal bed mobility: Needs Assistance Bed Mobility: Supine to Sit;Sit to Supine     Supine to sit: Mod assist Sit to supine: Mod assist   General bed mobility comments: Mod A for both BLE and upper body control and positioning  Transfers Overall transfer level: Needs assistance Equipment used: Rolling walker (2 wheeled) Transfers: Sit to/from Stand Sit to Stand: From elevated surface;Mod assist         General transfer comment: Mod verbal cues for sequencing most notably for hand placement  Ambulation/Gait Ambulation/Gait assistance: Min assist Gait Distance (Feet): 1 Feet Assistive device: Rolling walker (2 wheeled) Gait Pattern/deviations: Step-to pattern;Trunk flexed;Decreased step length - right;Decreased step length - left;Shuffle Gait velocity: Decreased   General Gait Details: Very short, shuffling steps at the EOB max of 1 foot before fatiguing and requiring to return to sitting.  Unable to stand a second time after multiple attempts.  Stairs            Wheelchair Mobility    Modified Rankin (Stroke Patients Only)  Balance Overall balance assessment: Needs assistance Sitting-balance support: Feet supported;Bilateral upper extremity supported Sitting balance-Leahy Scale: Fair Sitting balance - Comments: Slight lean to the Right that pt was able to correct  with min A and then hold independently Postural control: Right lateral lean Standing balance support: Bilateral upper extremity supported Standing balance-Leahy Scale: Fair Standing balance comment: Heavy reliance on BUE support for stability                             Pertinent Vitals/Pain Pain Assessment: No/denies pain    Home Living Family/patient expects to be discharged to:: Private residence Living Arrangements: Alone Available Help at Discharge: Friend(s);Available PRN/intermittently Type of Home: Apartment(3rd floor with elevator access) Home Access: Elevator     Home Layout: One level Home Equipment: None      Prior Function Level of Independence: Independent         Comments: Pt is an active community ambulator without an AD, drives, no fall history, Ind with all ADLs, and IADLs     Hand Dominance        Extremity/Trunk Assessment   Upper Extremity Assessment Upper Extremity Assessment: Generalized weakness    Lower Extremity Assessment Lower Extremity Assessment: Generalized weakness       Communication   Communication: No difficulties  Cognition Arousal/Alertness: Awake/alert Behavior During Therapy: WFL for tasks assessed/performed Overall Cognitive Status: Within Functional Limits for tasks assessed                                        General Comments      Exercises Total Joint Exercises Ankle Circles/Pumps: AROM;Both;10 reps Quad Sets: Strengthening;Both;10 reps Gluteal Sets: Strengthening;Both;10 reps Heel Slides: AAROM;5 reps Hip ABduction/ADduction: AAROM;Both;5 reps Straight Leg Raises: AAROM;Both;5 reps Long Arc Quad: AROM;Both;10 reps Knee Flexion: AROM;Both;10 reps   Assessment/Plan    PT Assessment Patient needs continued PT services  PT Problem List Decreased strength;Decreased activity tolerance;Decreased balance;Decreased knowledge of use of DME;Decreased mobility       PT Treatment  Interventions DME instruction;Gait training;Functional mobility training;Balance training;Therapeutic exercise;Therapeutic activities;Patient/family education    PT Goals (Current goals can be found in the Care Plan section)  Acute Rehab PT Goals Patient Stated Goal: To get stronger PT Goal Formulation: With patient Time For Goal Achievement: 11/21/17 Potential to Achieve Goals: Good    Frequency Min 2X/week   Barriers to discharge Inaccessible home environment;Decreased caregiver support      Co-evaluation               AM-PAC PT "6 Clicks" Daily Activity  Outcome Measure Difficulty turning over in bed (including adjusting bedclothes, sheets and blankets)?: Unable Difficulty moving from lying on back to sitting on the side of the bed? : Unable Difficulty sitting down on and standing up from a chair with arms (e.g., wheelchair, bedside commode, etc,.)?: Unable Help needed moving to and from a bed to chair (including a wheelchair)?: A Lot Help needed walking in hospital room?: Total Help needed climbing 3-5 steps with a railing? : Total 6 Click Score: 7    End of Session Equipment Utilized During Treatment: Gait belt;Oxygen Activity Tolerance: Patient limited by fatigue Patient left: in bed;with bed alarm set;with call bell/phone within reach(Pt unable to safely transfer to chair) Nurse Communication: Mobility status PT Visit Diagnosis: Muscle weakness (generalized) (M62.81);Difficulty in walking, not elsewhere  classified (R26.2)    Time: 1610-9604 PT Time Calculation (min) (ACUTE ONLY): 39 min   Charges:   PT Evaluation $PT Eval Low Complexity: 1 Low PT Treatments $Therapeutic Exercise: 8-22 mins        D. Scott Beaumont Austad PT, DPT 11/08/17, 3:28 PM

## 2017-11-09 ENCOUNTER — Telehealth: Payer: Self-pay | Admitting: Physician Assistant

## 2017-11-09 ENCOUNTER — Encounter: Payer: Self-pay | Admitting: *Deleted

## 2017-11-09 LAB — CBC
HEMATOCRIT: 32.8 % — AB (ref 36.0–46.0)
Hemoglobin: 10.8 g/dL — ABNORMAL LOW (ref 12.0–15.0)
MCH: 28.1 pg (ref 26.0–34.0)
MCHC: 32.9 g/dL (ref 30.0–36.0)
MCV: 85.2 fL (ref 80.0–100.0)
NRBC: 0 % (ref 0.0–0.2)
Platelets: 129 10*3/uL — ABNORMAL LOW (ref 150–400)
RBC: 3.85 MIL/uL — ABNORMAL LOW (ref 3.87–5.11)
RDW: 14.3 % (ref 11.5–15.5)
WBC: 8 10*3/uL (ref 4.0–10.5)

## 2017-11-09 LAB — GLUCOSE, CAPILLARY
GLUCOSE-CAPILLARY: 155 mg/dL — AB (ref 70–99)
GLUCOSE-CAPILLARY: 175 mg/dL — AB (ref 70–99)
Glucose-Capillary: 166 mg/dL — ABNORMAL HIGH (ref 70–99)
Glucose-Capillary: 145 mg/dL — ABNORMAL HIGH (ref 70–99)

## 2017-11-09 LAB — BASIC METABOLIC PANEL
Anion gap: 5 (ref 5–15)
BUN: 19 mg/dL (ref 8–23)
CALCIUM: 8.9 mg/dL (ref 8.9–10.3)
CHLORIDE: 105 mmol/L (ref 98–111)
CO2: 30 mmol/L (ref 22–32)
CREATININE: 1.11 mg/dL — AB (ref 0.44–1.00)
GFR, EST AFRICAN AMERICAN: 57 mL/min — AB (ref >60)
GFR, EST NON AFRICAN AMERICAN: 49 mL/min — AB (ref >60)
Glucose, Bld: 171 mg/dL — ABNORMAL HIGH (ref 70–99)
Potassium: 3.4 mmol/L — ABNORMAL LOW (ref 3.5–5.1)
SODIUM: 140 mmol/L (ref 135–145)

## 2017-11-09 LAB — HEPARIN LEVEL (UNFRACTIONATED): HEPARIN UNFRACTIONATED: 0.48 [IU]/mL (ref 0.30–0.70)

## 2017-11-09 MED ORDER — POTASSIUM CHLORIDE CRYS ER 20 MEQ PO TBCR
40.0000 meq | EXTENDED_RELEASE_TABLET | Freq: Two times a day (BID) | ORAL | Status: DC
  Administered 2017-11-09 – 2017-11-13 (×9): 40 meq via ORAL
  Filled 2017-11-09 (×9): qty 2

## 2017-11-09 MED ORDER — FUROSEMIDE 40 MG PO TABS
40.0000 mg | ORAL_TABLET | Freq: Every day | ORAL | Status: DC
  Administered 2017-11-10 – 2017-11-13 (×4): 40 mg via ORAL
  Filled 2017-11-09 (×4): qty 1

## 2017-11-09 MED ORDER — VALACYCLOVIR HCL 500 MG PO TABS
1000.0000 mg | ORAL_TABLET | Freq: Once | ORAL | Status: AC
  Administered 2017-11-09: 1000 mg via ORAL
  Filled 2017-11-09: qty 2

## 2017-11-09 MED ORDER — APIXABAN 5 MG PO TABS
5.0000 mg | ORAL_TABLET | Freq: Two times a day (BID) | ORAL | Status: DC
  Administered 2017-11-09 – 2017-11-13 (×9): 5 mg via ORAL
  Filled 2017-11-09 (×9): qty 1

## 2017-11-09 MED ORDER — SODIUM CHLORIDE 0.9 % IV SOLN
1.0000 g | Freq: Two times a day (BID) | INTRAVENOUS | Status: DC
  Administered 2017-11-09 – 2017-11-13 (×8): 1 g via INTRAVENOUS
  Filled 2017-11-09 (×9): qty 1

## 2017-11-09 NOTE — Progress Notes (Signed)
[facility] [place] [facility]   PATIENT NAME: Kristina Archer    MR#:  [id number]  DATE OF BIRTH:  May 26, 1945  visibly upset and wants to eat.  Refusing cardiac cath even if his picture Dr. Staci Righter she does not want cardiac cath told her that she may have a CAD, she is still says she does not want cardiac cath.  She does not want her son interference in her decisions.  Chief Complaint  Patient presents with  . Back Pain   No shortness of breath or chest pain. REVIEW OF SYSTEMS:   Review of Systems  HENT: Negative for hearing loss.    CONSTITUTIONAL: No fever, fatigue or weakness.  EYES: No blurred or double vision.  EARS, NOSE, AND THROAT: No tinnitus or ear pain.  RESPIRATORY: Shortness of breath decreased.Marland Kitchen  CARDIOVASCULAR: No chest pain, orthopnea, edema.  GASTROINTESTINAL: No nausea, vomiting, diarrhea or abdominal pain.  GENITOURINARY: No dysuria, hematuria.  ENDOCRINE: No polyuria, nocturia,  HEMATOLOGY: No anemia, easy bruising or bleeding SKIN: No rash or lesion. MUSCULOSKELETAL: No joint pain or arthritis.   NEUROLOGIC: No tingling, numbness, weakness.  PSYCHIATRY: No anxiety or depression.   DRUG ALLERGIES:   Allergies  Allergen Reactions  . Codeine Nausea Only    VITALS:  Blood pressure (!) 125/54, pulse 72, temperature 98.9 F (37.2 C), temperature source Oral, resp. rate 20, height 5\' 2"  (1.575 m), weight 87.5 kg, SpO2 96 %.  PHYSICAL EXAMINATION:  GENERAL:  72 y.o.-year-old patient lying in the bed with no acute distress.  EYES: Pupils equal, round, reactive to light and accommodation. No scleral icterus. Extraocular muscles intact.  HEENT: Head atraumatic, normocephalic. Oropharynx and nasopharynx clear.  NECK:  Supple, no jugular venous distention. No thyroid enlargement, no tenderness.  LUNGS: clear to  auscultation bilaterally.   No  rales.  CARDIOVASCULAR: S1, S2 regular no murmurs, rubs, or gallops.  ABDOMEN: Soft,  nontender, nondistended. Bowel sounds present. No organomegaly or mass.  EXTREMITIES: No pedal edema, cyanosis, or clubbing.  NEUROLOGIC: Cranial nerves II through XII are intact. Muscle strength 5/5 in all extremities. Sensation intact. Gait not checked.  PSYCHIATRIC: The patient is alert and oriented x 3.  SKIN: No obvious rash, lesion, or ulcer.    LABORATORY PANEL:   CBC Recent Labs  Lab 11/09/17 0549  WBC 8.0  HGB 10.8*  HCT 32.8*  PLT 129*   ------------------------------------------------------------------------------------------------------------------  Chemistries  Recent Labs  Lab 11/02/17 1408  11/08/17 0620 11/09/17 0549  NA 134*   < > 141 140  K 3.6   < > 3.1* 3.4*  CL 102   < > 103 105  CO2 21*   < > 29 30  GLUCOSE 350*   < > 123* 171*  BUN 16   < > 24* 19  CREATININE 1.28*   < > 1.03* 1.11*  CALCIUM 9.9   < > 9.1 8.9  MG  --    < > 2.1  --   AST 67*  --   --   --   ALT 49*  --   --   --   ALKPHOS 98  --   --   --   BILITOT 1.5*  --   --   --    < > = values in this interval not displayed.   ------------------------------------------------------------------------------------------------------------------  Cardiac Enzymes Recent Labs  Lab 11/05/17 0728  TROPONINI 0.66*   ------------------------------------------------------------------------------------------------------------------  RADIOLOGY:  No results found.  EKG:  Orders placed or performed during the hospital encounter of 11/02/17  . ED EKG  . ED EKG  . EKG 12-Lead  . EKG 12-Lead  . EKG 12-Lead  . EKG 12-Lead  . EKG 12-Lead  . EKG 12-Lead  . EKG 12-Lead  . EKG 12-Lead  . EKG 12-Lead  . EKG 12-Lead  . EKG 12-Lead  . EKG 12-Lead  . EKG 12-Lead  . EKG 12-Lead  . EKG 12-Lead  . EKG 12-Lead    ASSESSMENT AND PLAN:   #1 /right ureteral calculus with right-sided hydronephrosis secondary to obstructing calculus: Status post emergency right ureteral stent by urology .patient  has been afebrile after the procedure ESBL UTI, patient on meropenem.  Continue meropenem.  For total of 14 days.  #2. non-ST elevation MI, troponin peaked up to 0.80.  Seen by cardiology, and refusing cardiac cath, discussed with Dr. Kirke Corin today, continue beta-blockers, heparin drip, cardiology is planning to change Eliquis as patient refused cardiac cath.    #3. acute respiratory failure with hypoxia secondary to acute systolic heart failure EF 20% by echo, clinically patient feels better, shortness of breath improved, hypoxia improved, continue IV Lasix.  Hypokalemia, replace potassium.on 4litres .  Continue beta-blockers, ACE inhibitors, Lasix  3 .  Diabetes mellitus type 2:, Ucontinue Lantus, add NovoLog 4 units 3 times daily with meals. #4 anxiety, depression: Resume  Paxil.  #5 atrial fibrillation, rate controlled low, discontinue amiodarone drip, patient is on p.o. amiodarone 200 mg p.o. twice daily, on heparin drip at this time. 6 .hypokalemia, replace potassium.  Insomnia: Patient is requesting Ambien tonight.  .  Physical recommends SNF placement. All the records are reviewed and case discussed with Care Management/Social Workerr. Management plans discussed with the patient, family and they are in agreement.  CODE STATUS: DNR TOTAL TIME TAKING CARE OF THIS PATIENT: 38 minutes coordination of care  More than 50% of time spent in counseling, coordination of care. Patient is too critical to plan for discharge disposition.Marland Kitchen HIGH RISK FOR Cardiopulmonary arrest.  Katha Hamming M.D on 11/09/2017 at 12:10 PM  Between 7am to 6pm - Pager - [phone]  After 6pm go to [email or web address] - password [email or web address] [facility]  [name]  Office  [phone]  CC: Primary care physician; Corky Downs, MD   Note: This dictation was prepared with Dragon dictation along with smaller phrase technology. Any transcriptional errors that result from this process are  unintentional.

## 2017-11-09 NOTE — Plan of Care (Signed)
  Problem: Activity: Goal: Risk for activity intolerance will decrease Outcome: Progressing   Problem: Safety: Goal: Ability to remain free from injury will improve Outcome: Progressing   Problem: Urinary Elimination: Goal: Signs and symptoms of infection will decrease Outcome: Progressing

## 2017-11-09 NOTE — Telephone Encounter (Signed)
Patient is scheduled for TCM on 11/16/17 with Ward Givens

## 2017-11-09 NOTE — Care Management Note (Signed)
Case Management Note  Patient Details  Name: ALEKSIA FREIMAN MRN: [id number] Date of Birth: 21-Apr-1945  Subjective/Objective:   Patient is from home; independent. Hydronephrosis, s/p stent placement.  A-Fib with RVR started on Eliquis.  30 day free coupon given to patient.  She gets her prescriptions from [facility].  Had low BP's over night.  On 6L HF O2 acutely.  No oxygen requirements at home. Heparin drip discontinued about an hour ago.  Independent in all adls, denies issues accessing medical care, obtaining medications or with transportation.  Current with PCP.  She is planning to go to SNF at discharge, CSW aware.                      Action/Plan:   Expected Discharge Date:                  Expected Discharge Plan:  Skilled Nursing Facility  In-House Referral:     Discharge planning Services  CM Consult, Medication Assistance  Post Acute Care Choice:    Choice offered to:     DME Arranged:    DME Agency:     HH Arranged:    HH Agency:     Status of Service:  In process, will continue to follow  If discussed at Long Length of Stay Meetings, dates discussed:    Additional Comments:  Sherren Kerns, RN 11/09/2017, 2:52 PM

## 2017-11-09 NOTE — Care Management Important Message (Signed)
Important Message  Patient Details  Name: Kristina Archer MRN: [id number] Date of Birth: 1945-03-24   Medicare Important Message Given:  Yes    Sherren Kerns, RN 11/09/2017, 10:12 AM

## 2017-11-09 NOTE — Clinical Social Work Placement (Signed)
   CLINICAL SOCIAL WORK PLACEMENT  NOTE  Date:  11/09/2017  Patient Details  Name: Kristina Archer MRN: [id number] Date of Birth: 07/28/1945  Clinical Social Work is seeking post-discharge placement for this patient at the Skilled  Nursing Facility level of care (*CSW will initial, date and re-position this form in  chart as items are completed):  Yes   Patient/family provided with [facility] Clinical Social Work Department's list of facilities offering this level of care within the geographic area requested by the patient (or if unable, by the patient's family).  Yes   Patient/family informed of their freedom to choose among providers that offer the needed level of care, that participate in Medicare, Medicaid or managed care program needed by the patient, have an available bed and are willing to accept the patient.  Yes   Patient/family informed of [facility]'s ownership interest in [facility] and [facility], as well as of the fact that they are under no obligation to receive care at these facilities.  PASRR submitted to EDS on 11/09/17     PASRR number received on 11/09/17     Existing PASRR number confirmed on       FL2 transmitted to all facilities in geographic area requested by pt/family on 11/09/17     FL2 transmitted to all facilities within larger geographic area on       Patient informed that his/her managed care company has contracts with or will negotiate with certain facilities, including the following:            Patient/family informed of bed offers received.  Patient chooses bed at       Physician recommends and patient chooses bed at      Patient to be transferred to   on  .  Patient to be transferred to facility by       Patient family notified on   of transfer.  Name of family member notified:        PHYSICIAN       Additional Comment:    _______________________________________________ Ruthe Mannan, LCSWA 11/09/2017, 3:45 PM

## 2017-11-09 NOTE — Clinical Social Work Note (Signed)
Clinical Social Work Assessment  Patient Details  Name: Kristina Archer MRN: [id number] Date of Birth: [birth year]  Date of referral:  11/09/17               Reason for consult:  Facility Placement                Permission sought to share information with:  Case Manager, Customer service manager, Family Supports Permission granted to share information::  Yes, Verbal Permission Granted  Name::      SNF  Agency::   [facility]   Relationship::     Contact Information:     Housing/Transportation Living arrangements for the past 2 months:  [place] of Information:  Patient Patient Interpreter Needed:  None Criminal Activity/Legal Involvement Pertinent to Current Situation/Hospitalization:  No - Comment as needed Significant Relationships:  Adult Children Lives with:  Self Do you feel safe going back to the place where you live?  Yes Need for family participation in patient care:  No (Coment)  Care giving concerns:  Patient lives alone in [place] Worker assessment / plan:  CSW met with patient to discuss discharge plan. CSW introduced self and explained role. Patient states that she lives alone but has a son that helps as needed. CSW explained PT recommendation of SNF at discharge. Patient is agreeable to SNF and bed search. Patient's preference is [facility]. CSW will initiate bed search and give offers when available. CSW will follow for discharge planning.   Employment status:  Retired Nurse, adult PT Recommendations:  [place] / Referral to community resources:  [place]  Patient/Family's Response to care:  Patient thanked CSW for assistance   Patient/Family's Understanding of and Emotional Response to Diagnosis, Current Treatment, and Prognosis: Patient understanding of discharge plan and in agreement   Emotional Assessment Appearance:  Appears stated  age Attitude/Demeanor/Rapport:    Affect (typically observed):  Accepting, Happy, Pleasant Orientation:  Oriented to Self, Oriented to Place, Oriented to  Time, Oriented to Situation Alcohol / Substance use:  Not Applicable Psych involvement (Current and /or in the community):  No (Comment)  Discharge Needs  Concerns to be addressed:  Discharge Planning Concerns Readmission within the last 30 days:  No Current discharge risk:  Dependent with Mobility, Lives alone Barriers to Discharge:  Continued Medical Work up   [employer], [place] 11/09/2017, 3:23 PM

## 2017-11-09 NOTE — Progress Notes (Signed)
Inpatient Diabetes Program Recommendations  AACE/ADA: New Consensus Statement on Inpatient Glycemic Control (2015)  Target Ranges:  Prepandial:   less than 140 mg/dL      Peak postprandial:   less than 180 mg/dL (1-2 hours)      Critically ill patients:  140 - 180 mg/dL   Results for Kristina Archer, Kristina Archer (MRN [id number]) as of 11/09/2017 09:54  Ref. Range 11/08/2017 07:46 11/08/2017 11:50 11/08/2017 16:41 11/08/2017 21:11  Glucose-Capillary Latest Ref Range: 70 - 99 mg/dL 409 (H) 811 (H)  5 units NOVOLOG  269 (H)  8 units NOVOLOG  233 (H)    14 units LANTUS     Home DM Meds: Amaryl 2 mg daily       Metformin 500 mg BID       Januvia 100 mg daily  Current Orders: Lantus 14 units QHS      Novolog Moderate Correction Scale/ SSI (0-15 units) TID AC      Tradjenta 5 mg Daily      MD- Please consider starting Novolog Meal Coverage while patient's home Metformin and Amaryl are on hold:  Novolog 4 units TID with meals  (Please add the following Hold Parameters: Hold if pt eats <50% of meal, Hold if pt NPO)     --Will follow patient during hospitalization--  Ambrose Finland RN, MSN, CDE Diabetes Coordinator Inpatient Glycemic Control Team Team Pager: [phone] (8a-5p)

## 2017-11-09 NOTE — Consult Note (Signed)
ANTICOAGULATION CONSULT NOTE  Pharmacy Consult for heparin infusion Indication: chest pain/ACS  Allergies  Allergen Reactions  . Codeine Nausea Only    Patient Measurements: Height: 5\' 2"  (157.5 cm) Weight: 192 lb 12.8 oz (87.5 kg) IBW/kg (Calculated) : 50.1 Heparin Dosing Weight: 71.7  Vital Signs: Temp: 98.7 F (37.1 C) (10/31 0450) Temp Source: Oral (10/31 0450) BP: 110/49 (10/31 0450) Pulse Rate: 68 (10/31 0450)  Labs: Recent Labs    11/07/17 0421 11/08/17 0620 11/09/17 0549  HGB 12.1 11.7* 10.8*  HCT 36.6 34.9* 32.8*  PLT 161 159 129*  HEPARINUNFRC 0.37 0.33 0.48  CREATININE 1.20* 1.03* 1.11*    Estimated Creatinine Clearance: 49.2 mL/min (A) (by C-G formula based on SCr of 1.11 mg/dL (H)).   Medical History: Past Medical History:  Diagnosis Date  . Anxiety   . Cardiomyopathy (HCC)    a. 10/2017 Echo: EF 20-25%, diff HK, mild MR. Nl RV size.  . DDD (degenerative disc disease), lumbar   . Diverticulosis   . GERD (gastroesophageal reflux disease)   . High blood pressure   . High cholesterol   . LBBB (left bundle branch block)   . Nephrolithiasis   . Sciatica   . Type II diabetes mellitus (HCC)    Assessment: 72 y.o. female with history of HTN, HL, DMII, remote tob abuse, GERD, DDD, obesity, and anxiety, admitted with ureteral stone, complicated by acute systolic heart failure and elevated troponin. Hgb fairly stable at 11.7   Heparin Course:  Initiation: 10/26: bolus held, infusion started at 850 units/hr 10/26 1700 HL 0.39 10/26 2300 HL 0.37 10/27 0728 HL 0.20 bolus of 1100 units, drip to 1000 units/hr 10/27 1552 HL 0.20 bolus of 1100 units, drip to 1250 units/hr 10/27 2300 HL 0.32 10/28 0645 HL 0.32 10/29 0605 HL 0.37 10/30 0620 HL 0.33    Goal of Therapy:  Heparin level 0.3-0.7 units/ml Monitor platelets by anticoagulation protocol: Yes   Plan:  Continue current regimen. Recheck heparin level and CBC with tomorrow AM labs.  10/31 AM  heparin level 0.48. Continue current regimen. Recheck heparin level and CBC with tomorrow AM labs.  Beacher Every S, PharmD 11/09/2017,6:50 AM

## 2017-11-09 NOTE — Progress Notes (Addendum)
Pharmacy Electrolyte Monitoring Consult:  Pharmacy consulted to assist in monitoring and replacing electrolytes in this 72 y.o. female admitted on 11/02/2017 with right ureteral stone and CHF.   Patient developed A. Fib with RVR and was placed on amiodarone drip.   Patient is ordered furosemide 20mg  PO Q12hr and potassium PO Daily.  Labs:  Sodium (mmol/L)  Date Value  11/09/2017 140  10/11/2011 143   Potassium (mmol/L)  Date Value  11/09/2017 3.4 (L)  10/11/2011 3.4 (L)   Magnesium (mg/dL)  Date Value  16/10/9602 2.1   Phosphorus (mg/dL)  Date Value  54/09/8117 3.1   Calcium (mg/dL)  Date Value  14/78/2956 8.9   Calcium, Total (mg/dL)  Date Value  21/30/8657 10.0   Albumin (g/dL)  Date Value  84/69/6295 4.0  10/11/2011 4.3    Assessment/Plan: Patient received extra KCl 40 mEq po x 1 yesterday in addition to his scheduled 20 mEq po BID doses. IV furosemide has been converted to oral. K still low after 80 mEq yesterday. Will increase scheduled KCl to 40 mEq po BID.  In setting of CHF/Afib, will replace for goal potassium ~ 4,  goal magnesium ~2, and goal phosphorus > 2.5.   Since this was a critical care consult and patient is now on the floor pharmacy will sign off. Please re-consult if further assistance is desired.  Ohn Bostic A. Dahlia Bailiff, PharmD, BCPS Clinical Pharmacist 11/09/2017 7:15 AM

## 2017-11-09 NOTE — Telephone Encounter (Signed)
-----   Message from Lennon Alstrom, PA-C sent at 11/09/2017 12:24 PM EDT ----- Regarding: TCM Appointment Good afternoon!  Please schedule [name] for follow-up TCM appointment after discharge.   She was hospitalized with new onset Afib and elevated troponin with declined cardiac catheterization. We have started her on oral anticoagulation with Eliquis 5mg  BID and Amiodarone 200mg  BID with plans to decrease this dosage at follow-up to 200mg  daily.   Thank you,  Harvel Quale

## 2017-11-09 NOTE — NC FL2 (Signed)
[place] MEDICAID FL2 LEVEL OF CARE SCREENING TOOL     IDENTIFICATION  Patient Name: Kristina Archer Birthdate: Apr 21, 1945 Sex: female Admission Date (Current Location): 11/02/2017  [place] and [place] Number:  Chiropodist and Address:  [facility], [address], [place], [place] [address]      Provider Number: [id number]  Attending Physician Name and Address:  Katha Hamming, MD  Relative Name and Phone Number:  Adela Glimpse- son [phone]    Current Level of Care: Hospital Recommended Level of Care: Skilled Nursing Facility Prior Approval Number:    Date Approved/Denied:   PASRR Number: [id number]  Discharge Plan: SNF    Current Diagnoses: Patient Active Problem List   Diagnosis Date Noted  . CHF (congestive heart failure) (HCC)   . Right ureteral stone 11/02/2017    Orientation RESPIRATION BLADDER Height & Weight     Self, Time, Place, Situation  O2(6 Liters- HFNC) Continent Weight: 192 lb 12.8 oz (87.5 kg) Height:  5\' 2"  (157.5 cm)  BEHAVIORAL SYMPTOMS/MOOD NEUROLOGICAL BOWEL NUTRITION STATUS  (none) (none) Continent Diet(Heart Healthy )  AMBULATORY STATUS COMMUNICATION OF NEEDS Skin   Extensive Assist Verbally Normal                       Personal Care Assistance Level of Assistance  Bathing, Feeding, Dressing Bathing Assistance: Limited assistance Feeding assistance: Independent Dressing Assistance: Limited assistance     Functional Limitations Info  Sight, Hearing, Speech Sight Info: Adequate Hearing Info: Adequate Speech Info: Adequate    SPECIAL CARE FACTORS FREQUENCY  PT (By licensed PT), OT (By licensed OT)     PT Frequency: 5 OT Frequency: 5            Contractures Contractures Info: Not present    Additional Factors Info  Code Status, Allergies Code Status Info: DNR Allergies Info: Codeine            Current Medications (11/09/2017):  This is the current hospital  active medication list Current Facility-Administered Medications  Medication Dose Route Frequency Provider Last Rate Last Dose  . 0.9 %  sodium chloride infusion  250 mL Intravenous PRN End, Cristal Deer, MD      . 0.9 %  sodium chloride infusion   Intravenous Continuous End, Cristal Deer, MD   Stopped at 11/08/17 1357  . acetaminophen (TYLENOL) tablet 650 mg  650 mg Oral Q6H PRN Katha Hamming, MD   650 mg at 11/07/17 0830   Or  . acetaminophen (TYLENOL) suppository 650 mg  650 mg Rectal Q6H PRN Katha Hamming, MD      . amiodarone (PACERONE) tablet 200 mg  200 mg Oral BID End, Cristal Deer, MD   200 mg at 11/09/17 0931  . apixaban (ELIQUIS) tablet 5 mg  5 mg Oral BID Marisue Ivan D, PA-C   5 mg at 11/09/17 1304  . bisacodyl (DULCOLAX) EC tablet 5 mg  5 mg Oral Daily PRN Katha Hamming, MD      . carvedilol (COREG) tablet 3.125 mg  3.125 mg Oral BID WC Antonieta Iba, MD   3.125 mg at 11/09/17 0933  . docusate sodium (COLACE) capsule 100 mg  100 mg Oral BID Katha Hamming, MD   100 mg at 11/09/17 0930  . [START ON 11/10/2017] furosemide (LASIX) tablet 40 mg  40 mg Oral Daily Arida, Muhammad A, MD      . insulin aspart (novoLOG) injection 0-15 Units  0-15 Units Subcutaneous TID WC  Katha Hamming, MD   3 Units at 11/09/17 1205  . insulin glargine (LANTUS) injection 14 Units  14 Units Subcutaneous QHS Katha Hamming, MD   14 Units at 11/08/17 2117  . ipratropium-albuterol (DUONEB) 0.5-2.5 (3) MG/3ML nebulizer solution 3 mL  3 mL Nebulization TID Katha Hamming, MD   3 mL at 11/09/17 1311  . levalbuterol (XOPENEX) nebulizer solution 1.25 mg  1.25 mg Nebulization Q4H PRN Antonieta Iba, MD   1.25 mg at 11/07/17 0506  . linagliptin (TRADJENTA) tablet 5 mg  5 mg Oral Daily Katha Hamming, MD   5 mg at 11/09/17 0932  . lisinopril (PRINIVIL,ZESTRIL) tablet 2.5 mg  2.5 mg Oral Daily Eula Listen M, PA-C   2.5 mg at 11/09/17 0932  . LORazepam (ATIVAN)  injection 1 mg  1 mg Intravenous Q4H PRN Uvaldo Rising, MD   1 mg at 11/04/17 1430  . MEDLINE mouth rinse  15 mL Mouth Rinse BID Katha Hamming, MD   15 mL at 11/09/17 0932  . meropenem (MERREM) 1 g in sodium chloride 0.9 % 100 mL IVPB  1 g Intravenous Q12H Katha Hamming, MD      . ondansetron (ZOFRAN) tablet 4 mg  4 mg Oral Q6H PRN Katha Hamming, MD       Or  . ondansetron (ZOFRAN) injection 4 mg  4 mg Intravenous Q6H PRN Katha Hamming, MD      . pantoprazole (PROTONIX) EC tablet 40 mg  40 mg Oral Daily Katha Hamming, MD   40 mg at 11/09/17 0931  . PARoxetine (PAXIL) tablet 20 mg  20 mg Oral QHS Katha Hamming, MD   20 mg at 11/08/17 2117  . polyethylene glycol (MIRALAX / GLYCOLAX) packet 17 g  17 g Oral Daily Tukov-Yual, Magdalene S, NP   17 g at 11/08/17 0855  . potassium chloride SA (K-DUR,KLOR-CON) CR tablet 40 mEq  40 mEq Oral BID Katha Hamming, MD   40 mEq at 11/09/17 0930  . rosuvastatin (CRESTOR) tablet 40 mg  40 mg Oral Daily Creig Hines, NP   40 mg at 11/09/17 0931  . sodium chloride flush (NS) 0.9 % injection 3 mL  3 mL Intravenous Q12H End, Christopher, MD   3 mL at 11/09/17 0946  . sodium chloride flush (NS) 0.9 % injection 3 mL  3 mL Intravenous PRN End, Christopher, MD      . traZODone (DESYREL) tablet 50 mg  50 mg Oral QHS PRN Eugenie Norrie, NP   50 mg at 11/07/17 0133  . zolpidem (AMBIEN) tablet 5 mg  5 mg Oral QHS PRN Katha Hamming, MD   5 mg at 11/08/17 2132     Discharge Medications: Please see discharge summary for a list of discharge medications.  Relevant Imaging Results:  Relevant Lab Results:   Additional Information SSN: [id number]  Ruthe Mannan, [place]

## 2017-11-09 NOTE — Consult Note (Signed)
ANTICOAGULATION CONSULT NOTE  Pharmacy Consult for heparin infusion Indication: chest pain/ACS  Allergies  Allergen Reactions  . Codeine Nausea Only    Patient Measurements: Height: 5\' 2"  (157.5 cm) Weight: 192 lb 12.8 oz (87.5 kg) IBW/kg (Calculated) : 50.1 Heparin Dosing Weight: 71.7  Vital Signs: Temp: 98.7 F (37.1 C) (10/31 0450) Temp Source: Oral (10/31 0450) BP: 110/49 (10/31 0450) Pulse Rate: 68 (10/31 0450)  Labs: Recent Labs    11/07/17 0421 11/08/17 0620 11/09/17 0549  HGB 12.1 11.7* 10.8*  HCT 36.6 34.9* 32.8*  PLT 161 159 129*  HEPARINUNFRC 0.37 0.33 0.48  CREATININE 1.20* 1.03* 1.11*    Estimated Creatinine Clearance: 49.2 mL/min (A) (by C-G formula based on SCr of 1.11 mg/dL (H)).   Medical History: Past Medical History:  Diagnosis Date  . Anxiety   . Cardiomyopathy (HCC)    a. 10/2017 Echo: EF 20-25%, diff HK, mild MR. Nl RV size.  . DDD (degenerative disc disease), lumbar   . Diverticulosis   . GERD (gastroesophageal reflux disease)   . High blood pressure   . High cholesterol   . LBBB (left bundle branch block)   . Nephrolithiasis   . Sciatica   . Type II diabetes mellitus (HCC)    Assessment: 72 y.o. female with history of HTN, HL, DMII, remote tob abuse, GERD, DDD, obesity, and anxiety, admitted with ureteral stone, complicated by acute systolic heart failure and elevated troponin. Hgb fairly stable at 11.7   Heparin Course:  Initiation: 10/26: bolus held, infusion started at 850 units/hr 10/26 1700 HL 0.39 10/26 2300 HL 0.37 10/27 0728 HL 0.20 bolus of 1100 units, drip to 1000 units/hr 10/27 1552 HL 0.20 bolus of 1100 units, drip to 1250 units/hr 10/27 2300 HL 0.32 10/28 0645 HL 0.32 10/29 0605 HL 0.37 10/30 0620 HL 0.33 10/31 0549 HL 0.48    Goal of Therapy:  Heparin level 0.3-0.7 units/ml Monitor platelets by anticoagulation protocol: Yes   Plan:  Continue current regimen. Recheck heparin level and CBC with tomorrow  AM labs.  Carola Frost, PharmD, BCPS Clinical Pharmacist 11/09/2017,7:14 AM

## 2017-11-09 NOTE — Progress Notes (Signed)
PT Cancellation Note  Patient Details Name: Kristina Archer MRN: [id number] DOB: [birth year]   Cancelled Treatment:    Reason Eval/Treat Not Completed: Patient declined PT session this date secondary to fatigue.  Will attempt to see pt at a future date as appropriate.     Ovidio Hanger PT, DPT 11/09/17, 1:24 PM

## 2017-11-09 NOTE — Progress Notes (Signed)
Progress Note  Patient Name: Kristina Archer Date of Encounter: 11/09/2017  Primary Cardiologist: Julien Nordmann, MD  Subjective   Patient denies chest pain, palpitations, rapid heart rate. She confirmed that she does not want cardiac catheterization this admission.  She does not have any further complaints other than hunger and thirst.  Will plan to start on Eliquis 5mg  oral anticoagulation BID for new Afib. Will continue oral amiodarone 200mg  BID with plan to decrease dosage to 200mg  in a few weeks. Plan for follow-up in our office / follow-up labs.   Inpatient Medications    Scheduled Meds: . amiodarone  200 mg Oral BID  . aspirin  81 mg Oral Daily  . carvedilol  3.125 mg Oral BID WC  . docusate sodium  100 mg Oral BID  . furosemide  40 mg Oral BID  . insulin aspart  0-15 Units Subcutaneous TID WC  . insulin glargine  14 Units Subcutaneous QHS  . ipratropium-albuterol  3 mL Nebulization TID  . linagliptin  5 mg Oral Daily  . lisinopril  2.5 mg Oral Daily  . mouth rinse  15 mL Mouth Rinse BID  . pantoprazole  40 mg Oral Daily  . PARoxetine  20 mg Oral QHS  . polyethylene glycol  17 g Oral Daily  . potassium chloride  40 mEq Oral BID  . rosuvastatin  40 mg Oral Daily  . sodium chloride flush  3 mL Intravenous Q12H   Continuous Infusions: . sodium chloride    . sodium chloride Stopped (11/08/17 1357)  . ertapenem Stopped (11/08/17 1835)  . heparin 1,250 Units/hr (11/09/17 0542)   PRN Meds: sodium chloride, acetaminophen **OR** acetaminophen, bisacodyl, levalbuterol, LORazepam, ondansetron **OR** ondansetron (ZOFRAN) IV, sodium chloride flush, traZODone, zolpidem   Vital Signs    Vitals:   11/09/17 0117 11/09/17 0450 11/09/17 0742 11/09/17 0928  BP:  (!) 110/49  (!) 125/54  Pulse:  68  72  Resp:    20  Temp:  98.7 F (37.1 C)  98.9 F (37.2 C)  TempSrc:  Oral  Oral  SpO2:  96% 96% 96%  Weight: 87.5 kg     Height:        Intake/Output Summary (Last 24  hours) at 11/09/2017 1142 Last data filed at 11/09/2017 0542 Gross per 24 hour  Intake 470.11 ml  Output 1550 ml  Net -1079.89 ml   Filed Weights   11/07/17 2244 11/08/17 0404 11/09/17 0117  Weight: 87.3 kg 87.4 kg 87.5 kg    Telemetry    SR, HR 60-100s- Personally Reviewed  ECG    No new tracing.  Physical Exam   GEN: No acute distress.  Sitting in chair watching television Neck:  JVP difficult to assess d/t body habitus Cardiac:  Distant heart sounds.  Regular rate and rhythm without murmurs. Respiratory: Breath remain slightly diminished with crackles at the bases. GI: Obese, Soft, nontender, non-distended  MS:  trivial  bl LEE; No deformity. Neuro:  Nonfocal  Psych: Normal affect.  Labs    Chemistry Recent Labs  Lab 11/02/17 1408  11/07/17 0421 11/08/17 0620 11/09/17 0549  NA 134*   < > 143 141 140  K 3.6   < > 3.2* 3.1* 3.4*  CL 102   < > 105 103 105  CO2 21*   < > 28 29 30   GLUCOSE 350*   < > 101* 123* 171*  BUN 16   < > 35* 24* 19  CREATININE 1.28*   < >  1.20* 1.03* 1.11*  CALCIUM 9.9   < > 9.7 9.1 8.9  PROT 7.4  --   --   --   --   ALBUMIN 4.0  --   --   --   --   AST 67*  --   --   --   --   ALT 49*  --   --   --   --   ALKPHOS 98  --   --   --   --   BILITOT 1.5*  --   --   --   --   GFRNONAA 42*   < > 45* 54* 49*  GFRAA 48*   < > 52* >60 57*  ANIONGAP 11   < > 10 9 5    < > = values in this interval not displayed.     Hematology Recent Labs  Lab 11/07/17 0421 11/08/17 0620 11/09/17 0549  WBC 10.2 9.7 8.0  RBC 4.36 4.22 3.85*  HGB 12.1 11.7* 10.8*  HCT 36.6 34.9* 32.8*  MCV 83.9 82.7 85.2  MCH 27.8 27.7 28.1  MCHC 33.1 33.5 32.9  RDW 14.1 14.4 14.3  PLT 161 159 129*    Cardiac Enzymes Recent Labs  Lab 11/03/17 1801 11/03/17 2315 11/04/17 0408 11/05/17 0728  TROPONINI 0.27* 0.71* 0.80* 0.66*   No results for input(s): TROPIPOC in the last 168 hours.   BNP Recent Labs  Lab 11/03/17 1801  BNP 862.0*     DDimer No  results for input(s): DDIMER in the last 168 hours.   Radiology    No results found.  Cardiac Studies   Echocardiogram (11/03/17): - Left ventricle: The cavity size was mildly dilated. Systolic function was severely reduced. The estimated ejection fraction was in the range of 20% to 25%. Diffuse hypokinesis. Regional wall motion abnormalities cannot be excluded. The study is not technically sufficient to allow evaluation of LV diastolic function. - Mitral valve: There was mild regurgitation. - Left atrium: The atrium was normal in size. - Right ventricle: Systolic function was normal. - Pulmonary arteries: Systolic pressure could not be accurately estimated.  Patient Profile     72 y.o. female with history of HTN, HL, DMII, remote tob abuse, GERD, DDD, obesity, and anxiety, admitted with ureteral stone, complicated by acute systolic heart failure and elevated troponin.  Assessment & Plan    Acute respiratory failure with hypoxia and acute systolic heart failure In the setting of likely COPD exacerbation.  EF 20-25% with echo above  Weight increased 80.1kg  86.1kg  87.5 per documentation; Cr 1.11 with baseline estimated 0.92.  K 3.4 and repleting potassium as below; Consider updated BNP. I/O documented as -1579.16mL yesterday.  Declined recommended and scheduled L/R cardiac cath scheduled for 10/30 this week and  as documented in yesterday's progress notes with agreement to follow-up with Dr. Juel Burrow as an outpatient.   Continue oral lasix 40 mg po BID, carvedilol 3.125 mg twice daily, lisinopril 2.5mg  po qd. Continue ASA, BB, statin. Will transition from heparin to oral anticoagulation with Eliquis 5mg  BID in the setting of paroxysmal Afib. Continue oral amiodarone 200mg  bid po d/t previous short run of Afib earlier this week 10/27, currently SR. Plan to decrease to 200mg  daily and follow-up in our office / follow-up labs. Continue COPD treatment per medicine and  critical care. Remains on 4-6L [place] O2. Wean oxygen, as tolerated. Will need to ambulate prior to discharge   Will schedule TCM appointment prior to discharge  Demand ischemia No current chest pain, improved SOB still on Max.  Cannot r/o underlying CAD contributing to cardiomyopathy at this time. Troponin peaked 0.80, elevated in the setting of demand ischemia 0.29  0.71  0.80  0.66  Continue aspirin, carvedilol, and rosuvastatin, lisinopril.   Transition to oral anticoagulation with Eliquis 5mg  BID. Continue amiodarone and transition to oral 200mg  once daily in a few weeks. Afib noted 10/27  -converted back to sinus rhythm 10/28 and remains in SR today.   As above, declined L/R cardiac catheterization  Acute kidney injury Improved since admission. Consider multifactorial etiology including acute decompensated heart failure complicated by recent ureteral stone.  Daily BMP, continue diuresis. Monitor I/O, daily weights. Caution with nephrotoxic agents.   Hypokalemia - Recommend continue to replete with goal 4.0 - Mg 2.1 (10/29) - Daily BMET  Anemia - Hgb 10.4  12.1  10.8 - Monitor  Ureteral stone Asymptomatic at this time following ureteral stent placement by urology.  Per urology and internal medicine.  DM2 - SSI - Per IM  For questions or updates, please contact [facility] Please consult [email or web address] for contact info under [facility].  Signed, Lennon Alstrom, PA-C  11/09/2017, 11:42 AM

## 2017-11-09 NOTE — Telephone Encounter (Signed)
Patient currently admitted

## 2017-11-09 NOTE — Plan of Care (Signed)
  Problem: Nutrition: Goal: Adequate nutrition will be maintained Outcome: Progressing   Problem: Coping: Goal: Level of anxiety will decrease Outcome: Progressing   Problem: Elimination: Goal: Will not experience complications related to bowel motility Outcome: Progressing Note:  BM this shift   Problem: Elimination: Goal: Will not experience complications related to urinary retention Note:  Foley currently in place  Ambien given to help pt sleep, no complaints of pain this shift. Still currently on HiFlow [place]

## 2017-11-10 LAB — GLUCOSE, CAPILLARY
GLUCOSE-CAPILLARY: 217 mg/dL — AB (ref 70–99)
Glucose-Capillary: 124 mg/dL — ABNORMAL HIGH (ref 70–99)
Glucose-Capillary: 278 mg/dL — ABNORMAL HIGH (ref 70–99)
Glucose-Capillary: 176 mg/dL — ABNORMAL HIGH (ref 70–99)

## 2017-11-10 LAB — BASIC METABOLIC PANEL
Anion gap: 9 (ref 5–15)
BUN: 20 mg/dL (ref 8–23)
CHLORIDE: 101 mmol/L (ref 98–111)
CO2: 27 mmol/L (ref 22–32)
CREATININE: 1.25 mg/dL — AB (ref 0.44–1.00)
Calcium: 9 mg/dL (ref 8.9–10.3)
GFR calc Af Amer: 50 mL/min — ABNORMAL LOW (ref >60)
GFR calc non Af Amer: 43 mL/min — ABNORMAL LOW (ref >60)
GLUCOSE: 121 mg/dL — AB (ref 70–99)
Potassium: 3.7 mmol/L (ref 3.5–5.1)
SODIUM: 137 mmol/L (ref 135–145)

## 2017-11-10 LAB — CBC
HCT: 35 % — ABNORMAL LOW (ref 36.0–46.0)
Hemoglobin: 11.1 g/dL — ABNORMAL LOW (ref 12.0–15.0)
MCH: 27.1 pg (ref 26.0–34.0)
MCHC: 31.7 g/dL (ref 30.0–36.0)
MCV: 85.4 fL (ref 80.0–100.0)
Platelets: 148 10*3/uL — ABNORMAL LOW (ref 150–400)
RBC: 4.1 MIL/uL (ref 3.87–5.11)
RDW: 14.2 % (ref 11.5–15.5)
WBC: 10 10*3/uL (ref 4.0–10.5)
nRBC: 0 % (ref 0.0–0.2)

## 2017-11-10 LAB — MAGNESIUM: MAGNESIUM: 2.1 mg/dL (ref 1.7–2.4)

## 2017-11-10 LAB — PHOSPHORUS: Phosphorus: 2.8 mg/dL (ref 2.5–4.6)

## 2017-11-10 MED ORDER — SALINE SPRAY 0.65 % NA SOLN
1.0000 | NASAL | Status: DC | PRN
  Filled 2017-11-10: qty 44

## 2017-11-10 NOTE — Progress Notes (Signed)
[facility] - [place] at [facility]   PATIENT NAME: Kristina Archer    MR#:  [id number]  DATE OF BIRTH:  [birth year]  Patient sitting in the chair, no chest pain or shortness of breath.  Still requiring oxygen 4 L.  She said that she is agreeable for oxygen at discharge if needed.  For short time  Chief Complaint  Patient presents with  . Back Pain   No shortness of breath or chest pain. REVIEW OF SYSTEMS:   Review of Systems  HENT: Negative for hearing loss.    CONSTITUTIONAL: No fever, fatigue or weakness.  EYES: No blurred or double vision.  EARS, NOSE, AND THROAT: No tinnitus or ear pain.  RESPIRATORY: Shortness of breath decreased.Marland Kitchen  CARDIOVASCULAR: No chest pain, orthopnea, edema.  GASTROINTESTINAL: No nausea, vomiting, diarrhea or abdominal pain.  GENITOURINARY: No dysuria, hematuria.  ENDOCRINE: No polyuria, nocturia,  HEMATOLOGY: No anemia, easy bruising or bleeding SKIN: No rash or lesion. MUSCULOSKELETAL: No joint pain or arthritis.   NEUROLOGIC: No tingling, numbness, weakness.  PSYCHIATRY: No anxiety or depression.  RESolving fever blister on the right upper lip. DRUG ALLERGIES:   Allergies  Allergen Reactions  . Codeine Nausea Only    VITALS:  Blood pressure 116/61, pulse 68, temperature (!) 97.4 F (36.3 C), temperature source Oral, resp. rate 20, height 5\' 2"  (1.575 m), weight 87.3 kg, SpO2 94 %.  PHYSICAL EXAMINATION:  GENERAL:  72 y.o.-year-old patient lying in the bed with no acute distress.  Resolving fever blister on the right upper lip. EYES: Pupils equal, round, reactive to light and accommodation. No scleral icterus. Extraocular muscles intact.  HEENT: Head atraumatic, normocephalic. Oropharynx and nasopharynx clear.  NECK:  Supple, no jugular venous distention. No thyroid enlargement, no tenderness.  LUNGS: clear to  auscultation bilaterally.   No  rales.  CARDIOVASCULAR: S1, S2 regular no murmurs, rubs, or gallops.   ABDOMEN: Soft, nontender, nondistended. Bowel sounds present. No organomegaly or mass.  EXTREMITIES: No pedal edema, cyanosis, or clubbing.  NEUROLOGIC: Cranial nerves II through XII are intact. Muscle strength 5/5 in all extremities. Sensation intact. Gait not checked.  PSYCHIATRIC: The patient is alert and oriented x 3.  SKIN: No obvious rash, lesion, or ulcer.    LABORATORY PANEL:   CBC Recent Labs  Lab 11/10/17 0437  WBC 10.0  HGB 11.1*  HCT 35.0*  PLT 148*   ------------------------------------------------------------------------------------------------------------------  Chemistries  Recent Labs  Lab 11/10/17 0437  NA 137  K 3.7  CL 101  CO2 27  GLUCOSE 121*  BUN 20  CREATININE 1.25*  CALCIUM 9.0  MG 2.1   ------------------------------------------------------------------------------------------------------------------  Cardiac Enzymes Recent Labs  Lab 11/05/17 0728  TROPONINI 0.66*   ------------------------------------------------------------------------------------------------------------------  RADIOLOGY:  No results found.  EKG:   Orders placed or performed during the hospital encounter of 11/02/17  . ED EKG  . ED EKG  . EKG 12-Lead  . EKG 12-Lead  . EKG 12-Lead  . EKG 12-Lead  . EKG 12-Lead  . EKG 12-Lead  . EKG 12-Lead  . EKG 12-Lead  . EKG 12-Lead  . EKG 12-Lead  . EKG 12-Lead  . EKG 12-Lead  . EKG 12-Lead  . EKG 12-Lead  . EKG 12-Lead  . EKG 12-Lead    ASSESSMENT AND PLAN:   #1 /right ureteral calculus with right-sided hydronephrosis secondary to obstructing calculus: Status post emergency right ureteral stent by urology .patient has been afebrile after the procedure ESBL UTI, patient on meropenem.  Continue meropenem.  For total of 14 days.  #2. non-ST elevation MI, troponin peaked up to 0.80.  Seen by cardiology, and refusing cardiac cath, di continue aspirin, beta-blockers, statins, started on Eliquis.  Question about  whether she needs a LifeVest or not.   #3. acute respiratory failure with hypoxia secondary to acute systolic heart failure EF 20% by echo, clinically patient feels better, shortness of breath improved, hypoxia improved, started on p.o. Lasix today, patient is refusing cardiac cath .Marland Kitchen  And on high flow nasal cannula initially now down to 4 L and saturations are 94%.  Titrate and wean and see if she can go down to 2 L if possible.  Patient agreeable to go home on nursing home with oxygen if needed.  .  Hypokalemia, replace potassium'   3 .  Diabetes mellitus type 2:, continue Lantus, add NovoLog 4 units 3 times daily with meals.  Noted to have elevated blood sugar up to 278 at 12 PM today.  But overall better sugars yesterday.  I will watch blood sugars today and make further recommendation about her Lantus, mealtime insulin.  #4 anxiety, depression: Resume  Paxil.  #5 .atrial fibrillation, new onset: Rate controlled low, discontinue amiodarone drip, patient is on p.o. amiodarone 200 mg p.o. twice daily, started on Eliquis.    insomnia: Patient is requesting Ambien tonight.  .  Physical recommends SNF placement. Fever blister: Improved with Valtrex. All the records are reviewed and case discussed with Care Management/Social Workerr. Management plans discussed with the patient, family and they are in agreement.  CODE STATUS: DNR TOTAL TIME TAKING CARE OF THIS PATIENT: 38 minutes coordination of care  More than 50% of time spent in counseling, coordination of care. Patient is too critical to plan for discharge disposition.Marland Kitchen HIGH RISK FOR Cardiopulmonary arrest.  Katha Hamming M.D on 11/10/2017 at 1:04 PM  Between 7am to 6pm - Pager - [phone]  After 6pm go to [email or web address] - password [email or web address] [facility]  [place] [facility]  Office  [phone]  CC: Primary care physician; Corky Downs, MD   Note: This dictation was prepared with Dragon dictation along with smaller  phrase technology. Any transcriptional errors that result from this process are unintentional.

## 2017-11-10 NOTE — Progress Notes (Signed)
Physical Therapy Treatment Patient Details Name: Kristina Archer MRN: [id number] DOB: 1945-03-10 Today's Date: 11/10/2017    History of Present Illness Pt is a 72 y.o. female with a known history of essential hypertension, diabetes mellitus type 2, came to hospital with right flank pain.  Patient had severe right flank pain associated with nausea.  Found to have noted right hydronephrosis with 10x 13 mm stone at right UPJ.  Cystoscopy with right retrograde pyelogram and right ureteral stent placement performed 11/02/17.  Cardiology consulted secondary to Tachycardia, respiratory distress, and cardiomyopathy.  On 10/25 pt found to by hypoxic requiring 4LO2/min with chest xray showing acute pulmonary edema with HR 140 bpm, elevated troponin, and EF 20%.  Cardiology recommending cardiac catheterization with pt refusing procedure.  Assessment includes: right ureteral calculus with right-sided hydronephrosis secondary to obstructing calculus s/p emergency right ureteral stent by urology, non-ST elevation MI, troponin peaked up to 0.80, acute respiratory failure with hypoxia secondary to acute systolic heart failure EF 20% by echo, DM II, anxiety, A-fib, and insomnia.      PT Comments    Patient up in chair at start of session, agreeable to PT with no complaints of pain. Patient on 4L O2 throughout session, vitals stable. Patient able to perform sit <> stand transfers with CGA and min verbal cues for sequencing x6 reps in total during session, 2 without use of chair arms. Patient ambulated in room with CGA, RW ~79ft with 1 minute sitting rest break at 56ft. Able to perform few therapeutic exercises with demonstration from PT and min verbal cues. Patient complained of mild fatigue at end of session but overall demonstrated significant improvement from previous PT sessions. Pt still anxious with mobility and with decreased endurance. The patient would benefit from further skilled PT to continue to progress towards  goals to return to PLOF.      Follow Up Recommendations  SNF;Supervision for mobility/OOB     Equipment Recommendations  Rolling walker with 5" wheels;Other (comment)(TBD at next venue of care)    Recommendations for Other Services       Precautions / Restrictions Precautions Precautions: Fall Restrictions Weight Bearing Restrictions: No    Mobility  Bed Mobility               General bed mobility comments: Patient up in chair at start of session.  Transfers Overall transfer level: Needs assistance Equipment used: Rolling walker (2 wheeled) Transfers: Sit to/from Stand Sit to Stand: Min guard            Ambulation/Gait Ambulation/Gait assistance: Min guard Gait Distance (Feet): 12 Feet Assistive device: Rolling walker (2 wheeled)       General Gait Details: Short shuffling steps, decreased velocity. No LOB noted   Stairs             Wheelchair Mobility    Modified Rankin (Stroke Patients Only)       Balance Overall balance assessment: Needs assistance Sitting-balance support: Feet supported Sitting balance-Leahy Scale: Good       Standing balance-Leahy Scale: Fair                              Cognition Arousal/Alertness: Awake/alert Behavior During Therapy: WFL for tasks assessed/performed Overall Cognitive Status: Within Functional Limits for tasks assessed  Exercises Total Joint Exercises Long Arc Quad: AROM;Strengthening;Both;10 reps Marching in Standing: Strengthening;Both;15 reps Other Exercises Other Exercises: sit to stands x3 from chair, close supervision. Able to perform 2 reps without use of chair arms.    General Comments        Pertinent Vitals/Pain Pain Assessment: No/denies pain    Home Living                      Prior Function            PT Goals (current goals can now be found in the care plan section) Progress towards PT  goals: Progressing toward goals    Frequency    Min 2X/week      PT Plan Current plan remains appropriate    Co-evaluation              AM-PAC PT "6 Clicks" Daily Activity  Outcome Measure  Difficulty turning over in bed (including adjusting bedclothes, sheets and blankets)?: A Little Difficulty moving from lying on back to sitting on the side of the bed? : A Little Difficulty sitting down on and standing up from a chair with arms (e.g., wheelchair, bedside commode, etc,.)?: Unable Help needed moving to and from a bed to chair (including a wheelchair)?: A Little Help needed walking in hospital room?: A Little Help needed climbing 3-5 steps with a railing? : Total 6 Click Score: 14    End of Session Equipment Utilized During Treatment: Gait belt;Oxygen Activity Tolerance: Patient limited by fatigue Patient left: in chair;with call bell/phone within reach;with chair alarm set Nurse Communication: Mobility status PT Visit Diagnosis: Muscle weakness (generalized) (M62.81);Difficulty in walking, not elsewhere classified (R26.2)     Time: 1020-1045 PT Time Calculation (min) (ACUTE ONLY): 25 min  Charges:  $Therapeutic Activity: 23-37 mins                    Olga Coaster PT, DPT 10:58 AM,11/10/17 [id number]

## 2017-11-10 NOTE — Progress Notes (Signed)
Spoke with patient about apixaban- explained why we are using it and s/sx of bleeding Briefly reviewed HF medications, lisinopril, carvedilol, lasix- reasons for taking Explained why it seems she takes so many medications and the benefits of taking them Encouraged patient to have cath done Patient expressed interest in dietician consult. Will place consult  Olene Floss, Pharm.D, BCPS Clinical Pharmacist

## 2017-11-10 NOTE — Discharge Instructions (Signed)

## 2017-11-10 NOTE — Progress Notes (Signed)
Progress Note  Patient Name: Kristina Archer Date of Encounter: 11/10/2017  Primary Cardiologist: Julien Nordmann, MD  Subjective   Patient continues to deny CP, palpitations, racing heart rate, and SOB. She reports she slept well last night and is eager to eat breakfast this morning.   She was started on Eliquis 5mg  BID yesterday 10/31 and requested clarifications on the indications for this medication this morning, which we then reviewed at that time.   Her furosemide was also decreased to once daily.  Net -1579.9 output yesterday (10/30-10/31) Weight 86.2kg (10/24)  87.3kg (11/01) Cr 1.28 (10/24)  1.25 (11/1)  She was documented as hypotensive overnight with BP 115/50 at 4:40AM. No reported associated feelings of pre-syncope or weakness.   Inpatient Medications    Scheduled Meds: . amiodarone  200 mg Oral BID  . apixaban  5 mg Oral BID  . carvedilol  3.125 mg Oral BID WC  . docusate sodium  100 mg Oral BID  . furosemide  40 mg Oral Daily  . insulin aspart  0-15 Units Subcutaneous TID WC  . insulin glargine  14 Units Subcutaneous QHS  . ipratropium-albuterol  3 mL Nebulization TID  . linagliptin  5 mg Oral Daily  . lisinopril  2.5 mg Oral Daily  . mouth rinse  15 mL Mouth Rinse BID  . pantoprazole  40 mg Oral Daily  . PARoxetine  20 mg Oral QHS  . polyethylene glycol  17 g Oral Daily  . potassium chloride  40 mEq Oral BID  . rosuvastatin  40 mg Oral Daily  . sodium chloride flush  3 mL Intravenous Q12H   Continuous Infusions: . sodium chloride    . sodium chloride 10 mL/hr at 11/09/17 1742  . meropenem (MERREM) IV 1 g (11/10/17 0546)   PRN Meds: sodium chloride, acetaminophen **OR** acetaminophen, bisacodyl, levalbuterol, LORazepam, ondansetron **OR** ondansetron (ZOFRAN) IV, sodium chloride flush, traZODone, zolpidem   Vital Signs    Vitals:   11/09/17 2028 11/09/17 2045 11/10/17 0113 11/10/17 0440  BP: (!) 115/50   (!) 115/50  Pulse: 70   68  Resp:        Temp: 98.6 F (37 C)   98.4 F (36.9 C)  TempSrc: Oral   Oral  SpO2: 96% 96%  95%  Weight:   87.3 kg   Height:        Intake/Output Summary (Last 24 hours) at 11/10/2017 0749 Last data filed at 11/10/2017 0436 Gross per 24 hour  Intake 192.58 ml  Output 1150 ml  Net -957.42 ml   Filed Weights   11/08/17 0404 11/09/17 0117 11/10/17 0113  Weight: 87.4 kg 87.5 kg 87.3 kg    Telemetry    SR, HR 70-80s- Personally Reviewed  ECG    No new tracing.  Physical Exam   GEN: No acute distress. In bed and waking up for the morning. Neck:  JVP difficult to assess d/t body habitus Cardiac:  Distant heart sounds.  Regular rate and rhythm without murmurs. Respiratory: Breath remain slightly diminished at b/l bases, trace crackles. GI: Obese, Soft, nontender, non-distended  MS:  trivial LEE; No deformity. Neuro:  Nonfocal  Psych: Normal affect.  Labs    Chemistry Recent Labs  Lab 11/08/17 0620 11/09/17 0549 11/10/17 0437  NA 141 140 137  K 3.1* 3.4* 3.7  CL 103 105 101  CO2 29 30 27   GLUCOSE 123* 171* 121*  BUN 24* 19 20  CREATININE 1.03* 1.11* 1.25*  CALCIUM  9.1 8.9 9.0  GFRNONAA 54* 49* 43*  GFRAA >60 57* 50*  ANIONGAP 9 5 9      Hematology Recent Labs  Lab 11/08/17 0620 11/09/17 0549 11/10/17 0437  WBC 9.7 8.0 10.0  RBC 4.22 3.85* 4.10  HGB 11.7* 10.8* 11.1*  HCT 34.9* 32.8* 35.0*  MCV 82.7 85.2 85.4  MCH 27.7 28.1 27.1  MCHC 33.5 32.9 31.7  RDW 14.4 14.3 14.2  PLT 159 129* 148*    Cardiac Enzymes Recent Labs  Lab 11/03/17 1801 11/03/17 2315 11/04/17 0408 11/05/17 0728  TROPONINI 0.27* 0.71* 0.80* 0.66*   No results for input(s): TROPIPOC in the last 168 hours.   BNP Recent Labs  Lab 11/03/17 1801  BNP 862.0*     DDimer No results for input(s): DDIMER in the last 168 hours.   Radiology    No results found.  Cardiac Studies   Echocardiogram (11/03/17): - Left ventricle: The cavity size was mildly dilated. Systolic function was  severely reduced. The estimated ejection fraction was in the range of 20% to 25%. Diffuse hypokinesis. Regional wall motion abnormalities cannot be excluded. The study is not technically sufficient to allow evaluation of LV diastolic function. - Mitral valve: There was mild regurgitation. - Left atrium: The atrium was normal in size. - Right ventricle: Systolic function was normal. - Pulmonary arteries: Systolic pressure could not be accurately estimated.  Patient Profile     72 y.o. female with history of HTN, HL, DMII, remote tob abuse, GERD, DDD, obesity, and anxiety, admitted with ureteral stone, complicated by acute systolic heart failure and elevated troponin.  Assessment & Plan    Acute respiratory failure with hypoxia and acute systolic heart failure In the setting of likely COPD exacerbation.  EF 20-25% with echo above. Recent I/O and weights as above in HPI  Declined recommended and scheduled L/R cardiac cath scheduled for 10/30 this week and  as documented in yesterday's progress notes with agreement to follow-up with Dr. Juel Burrow as an outpatient.   Continue reduced oral lasix 40 mg po qd, carvedilol 3.125 mg po bid with hold parameters for systolic BP less than 110, lisinopril 2.5mg  po qd. Continue ASA, statin. On Eliquis 5mg  BID & amiodarone 200mg  po bid d/t new onset paroxysmal Afib earlier this week 10/2; currently SR.   Remains on 4-6L [place] O2. Wean oxygen, as tolerated. Will need to ambulate / PT prior to discharge  Scheduled for follow-up on 11/7 at our office with plan for follow-up labs and to decrease amiodarone from 200mg  BID to 200mg  daily in 2 weeks. Will plan to reschedule this appointment if the patient remains in the hospital over the weekend.  Demand ischemia No current chest pain, improved SOB still on [place].  Cannot r/o underlying CAD contributing to cardiomyopathy at this time. Troponin peaked 0.80, elevated in the setting of demand ischemia 0.29   0.71  0.80  0.66  Medical management as above  As above, declined L/R cardiac catheterization  Acute kidney injury Consider multifactorial etiology including acute decompensated heart failure complicated by recent ureteral stone.  Caution with nephrotoxic agents. Decreased furosemide to 40mg  po qd.  Hypokalemia - K 3.7 and still not at goal. Recommend continue to replete with goal 4.0. Will need potassium supplement with discharge. Could also consider a potassium sparing diuretic, such as spironolactone.mag  - Mg 2.1 (11/1) and at goal - Continue to monitor and follow-up labs at outpatient appointment   Anemia - Hgb 10.4  11.1 - Monitor  Ureteral stone Asymptomatic at this time following ureteral stent placement by urology.  Per urology and internal medicine.  DM2 - Per IM   For questions or updates, please contact [facility] Please consult [email or web address] for contact info under [facility].  Signed, Lennon Alstrom, PA-C  11/10/2017, 7:49 AM

## 2017-11-10 NOTE — Clinical Social Work Note (Signed)
CSW presented bed offers to patient and she would like [employer].  Patient is regular [employer], SNF will have to start insurance authorization.  CSW contacted [facility], and they can accept patient pending insurance authorization.  CSW to continue to follow patient's progress throughout discharge planning.  Ervin Knack. Falcon Mccaskey, MSW, [facility] [phone]  11/10/2017 2:28 PM

## 2017-11-10 NOTE — Telephone Encounter (Signed)
STILL ADMITTED °

## 2017-11-11 DIAGNOSIS — I4891 Unspecified atrial fibrillation: Secondary | ICD-10-CM

## 2017-11-11 LAB — GLUCOSE, CAPILLARY
GLUCOSE-CAPILLARY: 293 mg/dL — AB (ref 70–99)
Glucose-Capillary: 132 mg/dL — ABNORMAL HIGH (ref 70–99)
Glucose-Capillary: 174 mg/dL — ABNORMAL HIGH (ref 70–99)
Glucose-Capillary: 181 mg/dL — ABNORMAL HIGH (ref 70–99)

## 2017-11-11 NOTE — Progress Notes (Signed)
Progress Note  Patient Name: Kristina Archer Date of Encounter: 11/11/2017  Primary Cardiologist: Julien Nordmann, MD   Subjective   The patient is feeling better today.  She was able to discontinue supplemental oxygen yesterday.  She denies chest pain or shortness of breath at rest.  She is lying comfortably flat in bed.  Eager to go home.  Inpatient Medications    Scheduled Meds: . amiodarone  200 mg Oral BID  . apixaban  5 mg Oral BID  . carvedilol  3.125 mg Oral BID WC  . docusate sodium  100 mg Oral BID  . furosemide  40 mg Oral Daily  . insulin aspart  0-15 Units Subcutaneous TID WC  . insulin glargine  14 Units Subcutaneous QHS  . ipratropium-albuterol  3 mL Nebulization TID  . linagliptin  5 mg Oral Daily  . lisinopril  2.5 mg Oral Daily  . mouth rinse  15 mL Mouth Rinse BID  . pantoprazole  40 mg Oral Daily  . PARoxetine  20 mg Oral QHS  . polyethylene glycol  17 g Oral Daily  . potassium chloride  40 mEq Oral BID  . rosuvastatin  40 mg Oral Daily  . sodium chloride flush  3 mL Intravenous Q12H   Continuous Infusions: . sodium chloride    . sodium chloride Stopped (11/10/17 1832)  . meropenem (MERREM) IV 1 g (11/11/17 0655)   PRN Meds: sodium chloride, acetaminophen **OR** acetaminophen, bisacodyl, levalbuterol, LORazepam, ondansetron **OR** ondansetron (ZOFRAN) IV, sodium chloride, sodium chloride flush, traZODone, zolpidem   Vital Signs    Vitals:   11/10/17 1943 11/11/17 0335 11/11/17 0752 11/11/17 0816  BP: (!) 121/56 100/78  (!) 120/49  Pulse: 71 69  62  Resp: 17 17  18   Temp: (!) 97.4 F (36.3 C) 99.3 F (37.4 C)  97.9 F (36.6 C)  TempSrc: Oral Oral    SpO2: 96% 97% 92% 94%  Weight:  87.4 kg    Height:        Intake/Output Summary (Last 24 hours) at 11/11/2017 1247 Last data filed at 11/11/2017 0400 Gross per 24 hour  Intake 120 ml  Output 400 ml  Net -280 ml   Filed Weights   11/09/17 0117 11/10/17 0113 11/11/17 0335  Weight: 87.5 kg  87.3 kg 87.4 kg    Telemetry    Sinus rhythm - Personally Reviewed   Physical Exam  Elderly, obese woman in no distress. GEN: No acute distress.   Neck: No JVD Cardiac: RRR, no murmurs, rubs, or gallops.  Respiratory: Clear to auscultation bilaterally. GI: Soft, nontender, non-distended  MS:  Trace bilateral pretibial edema; No deformity. Neuro:  Nonfocal  Psych: Normal affect   Labs    Chemistry Recent Labs  Lab 11/08/17 0620 11/09/17 0549 11/10/17 0437  NA 141 140 137  K 3.1* 3.4* 3.7  CL 103 105 101  CO2 29 30 27   GLUCOSE 123* 171* 121*  BUN 24* 19 20  CREATININE 1.03* 1.11* 1.25*  CALCIUM 9.1 8.9 9.0  GFRNONAA 54* 49* 43*  GFRAA >60 57* 50*  ANIONGAP 9 5 9      Hematology Recent Labs  Lab 11/08/17 0620 11/09/17 0549 11/10/17 0437  WBC 9.7 8.0 10.0  RBC 4.22 3.85* 4.10  HGB 11.7* 10.8* 11.1*  HCT 34.9* 32.8* 35.0*  MCV 82.7 85.2 85.4  MCH 27.7 28.1 27.1  MCHC 33.5 32.9 31.7  RDW 14.4 14.3 14.2  PLT 159 129* 148*    Cardiac  Enzymes Recent Labs  Lab 11/05/17 0728  TROPONINI 0.66*   No results for input(s): TROPIPOC in the last 168 hours.   BNPNo results for input(s): BNP, PROBNP in the last 168 hours.   DDimer No results for input(s): DDIMER in the last 168 hours.   Radiology    No results found.  Cardiac Studies   Echo: Study Conclusions  - Left ventricle: The cavity size was mildly dilated. Systolic   function was severely reduced. The estimated ejection fraction   was in the range of 20% to 25%. Diffuse hypokinesis. Regional   wall motion abnormalities cannot be excluded. The study is not   technically sufficient to allow evaluation of LV diastolic   function. - Mitral valve: There was mild regurgitation. - Left atrium: The atrium was normal in size. - Right ventricle: Systolic function was normal. - Pulmonary arteries: Systolic pressure could not be accurately   estimated.  Impressions:  - Rhythm is wide complex  tachycardia rate 129 bpm.  Patient Profile     72 y.o. female with history ofHTN, HL, DMII, remote tob abuse, GERD, DDD, obesity, and anxiety, admitted with ureteral stone, complicated by acute systolic heart failure and elevated troponin.  Assessment & Plan    Acute systolic heart failure with LVEF 20 to 25%: The patient now appears euvolemic on furosemide 40 mg daily.  He is also treated with lisinopril and carvedilol.  It would be reasonable to add Spironolactone as an outpatient.  She has declined right and left heart catheterization and I discussed this with her today.  She is very clear and not wishing to pursue this.  I think she is stable from a cardiac perspective and can be discharged when her other medical problems will allow.  Atrial fibrillation with RVR: Converted to normal sinus rhythm on IV amiodarone, now maintaining sinus rhythm on oral amiodarone and tolerating anticoagulation with apixaban.  Elevated troponin likely demand ischemia: The patient has declined cardiac catheterization.  She will be treated medically.  She is on oral anticoagulation with apixaban and a high intensity statin drug.  Also treated with a beta-blocker.  [facility] will sign off.   Medication Recommendations:  Continue current Rx Other recommendations (labs, testing, etc):  none Follow up as an outpatient:  Dr Juel Burrow  For questions or updates, please contact [facility] Please consult [email or web address] for contact info under        Signed, Tonny Bollman, MD  11/11/2017, 12:47 PM

## 2017-11-11 NOTE — Progress Notes (Signed)
[facility] -  at [facility]   PATIENT NAME: Kristina Archer    MR#:  [id number]  DATE OF BIRTH:  11/20/1945   off the oxygen.  Had epistaxis last night which subsided by itself.  Chief Complaint  Patient presents with  . Back Pain   No shortness of breath or chest pain. REVIEW OF SYSTEMS:   Review of Systems  HENT: Negative for hearing loss.    CONSTITUTIONAL: No fever, fatigue or weakness.  EYES: No blurred or double vision.  EARS, NOSE, AND THROAT: No tinnitus or ear pain.  RESPIRATORY: Shortness of breath decreased.Marland Kitchen  CARDIOVASCULAR: No chest pain, orthopnea, edema.  GASTROINTESTINAL: No nausea, vomiting, diarrhea or abdominal pain.  GENITOURINARY: No dysuria, hematuria.  ENDOCRINE: No polyuria, nocturia,  HEMATOLOGY: No anemia, easy bruising or bleeding SKIN: No rash or lesion. MUSCULOSKELETAL: No joint pain or arthritis.   NEUROLOGIC: No tingling, numbness, weakness.  PSYCHIATRY: No anxiety or depression.  RESolving fever blister on the right upper lip. DRUG ALLERGIES:   Allergies  Allergen Reactions  . Codeine Nausea Only    VITALS:  Blood pressure (!) 120/49, pulse 62, temperature 97.9 F (36.6 C), resp. rate 18, height 5\' 2"  (1.575 m), weight 87.4 kg, SpO2 94 %.  PHYSICAL EXAMINATION:  GENERAL:  72 y.o.-year-old patient lying in the bed with no acute distress.  Resolving fever blister on the right upper lip. EYES: Pupils equal, round, reactive to light and accommodation. No scleral icterus. Extraocular muscles intact.  HEENT: Head atraumatic, normocephalic. Oropharynx and nasopharynx clear.  NECK:  Supple, no jugular venous distention. No thyroid enlargement, no tenderness.  LUNGS: clear to  auscultation bilaterally.   No  rales.  CARDIOVASCULAR: S1, S2 regular no murmurs, rubs, or gallops.  ABDOMEN: Soft, nontender, nondistended. Bowel sounds present. No organomegaly or mass.  EXTREMITIES: No pedal edema, cyanosis, or clubbing.   NEUROLOGIC: Cranial nerves II through XII are intact. Muscle strength 5/5 in all extremities. Sensation intact. Gait not checked.  PSYCHIATRIC: The patient is alert and oriented x 3.  SKIN: No obvious rash, lesion, or ulcer.    LABORATORY PANEL:   CBC Recent Labs  Lab 11/10/17 0437  WBC 10.0  HGB 11.1*  HCT 35.0*  PLT 148*   ------------------------------------------------------------------------------------------------------------------  Chemistries  Recent Labs  Lab 11/10/17 0437  NA 137  K 3.7  CL 101  CO2 27  GLUCOSE 121*  BUN 20  CREATININE 1.25*  CALCIUM 9.0  MG 2.1   ------------------------------------------------------------------------------------------------------------------  Cardiac Enzymes Recent Labs  Lab 11/05/17 0728  TROPONINI 0.66*   ------------------------------------------------------------------------------------------------------------------  RADIOLOGY:  No results found.  EKG:   Orders placed or performed during the hospital encounter of 11/02/17  . ED EKG  . ED EKG  . EKG 12-Lead  . EKG 12-Lead  . EKG 12-Lead  . EKG 12-Lead  . EKG 12-Lead  . EKG 12-Lead  . EKG 12-Lead  . EKG 12-Lead  . EKG 12-Lead  . EKG 12-Lead  . EKG 12-Lead  . EKG 12-Lead  . EKG 12-Lead  . EKG 12-Lead  . EKG 12-Lead  . EKG 12-Lead    ASSESSMENT AND PLAN:   #1 /right ureteral calculus with right-sided hydronephrosis secondary to obstructing calculus: Status post emergency right ureteral stent by urology .patient has been afebrile after the procedure ESBL UTI, patient on meropenem.  Continue meropenem.  For total of 14 days.  And is on antibiotics since admission on October 24.  #2. non-ST elevation MI, troponin  peaked up to 0.80.  Seen by cardiology, and refusing cardiac cath,i continue aspirin, beta-blockers, statins, started on Eliquis.  Question about whether she needs a LifeVest or not.   #3. acute respiratory failure with hypoxia secondary  to acute systolic heart failure EF 20% by echo, clinically patient feels better, shortness of breath improved, hypoxia improved, started on p.o. Lasix today, patient is refusing cardiac cath .Marland Kitchen She  is off the oxygen. .  Hypokalemia, replace potassium'   3 .  Diabetes mellitus type 2:, continue Lantus, add NovoLog 4 units 3 times daily with meals.    #4 anxiety, depression: Resume  Paxil.  #5 .atrial fibrillation, new onset: Rate controlled low, discontinue amiodarone drip, patient is on p.o. amiodarone 200 mg p.o. twice daily, started on Eliquis.    Epistaxis resolved on its own.  Continue to watch, added saline nose drops. insomnia: Patient is requesting Ambien tonight.  .  Physical recommends SNF placement.  Waiting for insurance authorization. Fever blister: Improved with Valtrex. All the records are reviewed and case discussed with Care Management/Social Workerr. Management plans discussed with the patient, family and they are in agreement.  CODE STATUS: DNR TOTAL TIME TAKING CARE OF THIS PATIENT: 38 minutes coordination of care  More than 50% of time spent in counseling, coordination of care. Patient is too critical to plan for discharge disposition.Marland Kitchen HIGH RISK FOR Cardiopulmonary arrest.  Katha Hamming M.D on 11/11/2017 at 11:29 AM  Between 7am to 6pm - Pager - [phone]  After 6pm go to [email or web address] - password [email or web address] [facility]  [place]  [facility]  Office  [phone]  CC: Primary care physician; Corky Downs, MD   Note: This dictation was prepared with Dragon dictation along with smaller phrase technology. Any transcriptional errors that result from this process are unintentional.

## 2017-11-11 NOTE — Clinical Social Work Note (Signed)
CSW received verbal update from [facility] that the patient is now declining SNF. CSW is signing off. Please consult should patient change her mind on disposition for discharge. CSW is not cancelling SNF authorization request until the patient discharges with home health should last minute changes occur.  Argentina Ponder, MSW, [facility] [phone]

## 2017-11-12 LAB — GLUCOSE, CAPILLARY
GLUCOSE-CAPILLARY: 186 mg/dL — AB (ref 70–99)
GLUCOSE-CAPILLARY: 298 mg/dL — AB (ref 70–99)
Glucose-Capillary: 262 mg/dL — ABNORMAL HIGH (ref 70–99)
Glucose-Capillary: 263 mg/dL — ABNORMAL HIGH (ref 70–99)

## 2017-11-12 MED ORDER — INSULIN GLARGINE 100 UNIT/ML ~~LOC~~ SOLN
16.0000 [IU] | Freq: Every day | SUBCUTANEOUS | Status: DC
  Administered 2017-11-12: 16 [IU] via SUBCUTANEOUS
  Filled 2017-11-12 (×2): qty 0.16

## 2017-11-12 MED ORDER — INSULIN ASPART 100 UNIT/ML ~~LOC~~ SOLN: 3.0000 [IU] | Freq: Three times a day (TID) | SUBCUTANEOUS | Status: DC

## 2017-11-12 NOTE — Plan of Care (Signed)
Nutrition Education Note  RD consulted for nutrition education regarding CHF and kidney stones.  Met with patient at bedside. She reports she has a good appetite and intake at baseline. She has not been able to finish all of her meals here because the portions are larger than what she is used to. Patient reports she is interested in learning how to eat healthier. She reports she does not routinely add salt to food. She does eat processed foods and does not seem to be eating many fruits and vegetables in her diet at home. Patient is unsure what type of kidney stones she makes.  RD provided "Heart Failure Nutrition Therapy" handout from the Academy of [employer]. Reviewed patient's dietary recall. Provided examples on ways to decrease sodium intake in diet. Discouraged intake of processed foods and use of salt shaker. Encouraged fresh fruits and vegetables as well as whole grain sources of carbohydrates to maximize fiber intake.   RD discussed why it is important for patient to adhere to diet recommendations, and emphasized the role of fluids, foods to avoid, and importance of weighing self daily.   RD provided "Kidney Stones Nutrition Therapy" handout from the Academy of [employer]. Nutrition therapy for kidney stones needs to be personalized based on the types of stones the patient makes and urine characteristics. This information is not available in chart and patient is also not sure what type of stones she makes. Encouraged her to bring the handout to her urology follow-up to see if they have any of the labs or information available. We did discuss that increasing fruit and vegetable intake and decreasing sodium intake can help in preventing certain kidney stones, so those are two dietary changes she can start with.  Teach back method used.  Expect fair to good compliance.  Body mass index is 34.79 kg/m. Pt meets criteria for obesity class I based on current  BMI.  Current diet order is heart healthy/carbohydrate modified, patient is consuming approximately 60-100% of meals at this time (reports the meals are larger portions than she is used to). Labs and medications reviewed. No further nutrition interventions warranted at this time. RD contact information provided. If additional nutrition issues arise, please re-consult RD.   Willey Blade, MS, [place], LDN Office: [phone] Pager: [phone] After Hours/Weekend Pager: [phone]

## 2017-11-12 NOTE — Care Management Note (Signed)
Case Management Note  Patient Details  Name: Kristina Archer MRN: [id number] Date of Birth: [birth year]  Subjective/Objective:    Patient refusing SNF at this time although that is Pt recommendation. Patient agreeable to home health. Per patient preference will place referral with [facility]. [place] aware of referral and patient is set to discharge tomorrow. There are no DME needs, patient reports she has all necessary equipment in the home.                 Action/Plan: RNCM to continue to follow for any needs.   Expected Discharge Date:  11/11/17               Expected Discharge Plan:  Home w Home Health Services  In-House Referral:     Discharge planning Services  CM Consult, Medication Assistance  Post Acute Care Choice:    Choice offered to:  Patient  DME Arranged:    DME Agency:     HH Arranged:  RN, PT HH Agency:  [facility]  Status of Service:  In process, will continue to follow  If discussed at Long Length of Stay Meetings, dates discussed:    Additional Comments:  Priscella Donna A [place], RN 11/12/2017, 10:20 AM

## 2017-11-12 NOTE — Progress Notes (Signed)
[facility] - [place] at [facility]   PATIENT NAME: Kristina Archer    MR#:  [id number]  DATE OF BIRTH:  [birth year]  Patient doing well, denies any complaints.  Chief Complaint  Patient presents with  . Back Pain   No shortness of breath or chest pain. REVIEW OF SYSTEMS:   Review of Systems  HENT: Negative for hearing loss.    CONSTITUTIONAL: No fever, fatigue or weakness.  EYES: No blurred or double vision.  EARS, NOSE, AND THROAT: No tinnitus or ear pain.  RESPIRATORY: Shortness of breath decreased.Marland Kitchen  CARDIOVASCULAR: No chest pain, orthopnea, edema.  GASTROINTESTINAL: No nausea, vomiting, diarrhea or abdominal pain.  GENITOURINARY: No dysuria, hematuria.  ENDOCRINE: No polyuria, nocturia,  HEMATOLOGY: No anemia, easy bruising or bleeding SKIN: No rash or lesion. MUSCULOSKELETAL: No joint pain or arthritis.   NEUROLOGIC: No tingling, numbness, weakness.  PSYCHIATRY: No anxiety or depression.  RESolving fever blister on the right upper lip. DRUG ALLERGIES:   Allergies  Allergen Reactions  . Codeine Nausea Only    VITALS:  Blood pressure (!) 127/58, pulse 72, temperature 97.9 F (36.6 C), temperature source Oral, resp. rate 20, height 5\' 2"  (1.575 m), weight 86.3 kg, SpO2 93 %.  PHYSICAL EXAMINATION:  GENERAL:  72 y.o.-year-old patient lying in the bed with no acute distress.  Resolving fever blister on the right upper lip. EYES: Pupils equal, round, reactive to light and accommodation. No scleral icterus. Extraocular muscles intact.  HEENT: Head atraumatic, normocephalic. Oropharynx and nasopharynx clear.  NECK:  Supple, no jugular venous distention. No thyroid enlargement, no tenderness.  LUNGS: clear to  auscultation bilaterally.   No  rales.  CARDIOVASCULAR: S1, S2 regular no murmurs, rubs, or gallops.  ABDOMEN: Soft, nontender, nondistended. Bowel sounds present. No organomegaly or mass.  EXTREMITIES: No pedal edema, cyanosis, or clubbing.   NEUROLOGIC: Cranial nerves II through XII are intact. Muscle strength 5/5 in all extremities. Sensation intact. Gait not checked.  PSYCHIATRIC: The patient is alert and oriented x 3.  SKIN: No obvious rash, lesion, or ulcer.    LABORATORY PANEL:   CBC Recent Labs  Lab 11/10/17 0437  WBC 10.0  HGB 11.1*  HCT 35.0*  PLT 148*   ------------------------------------------------------------------------------------------------------------------  Chemistries  Recent Labs  Lab 11/10/17 0437  NA 137  K 3.7  CL 101  CO2 27  GLUCOSE 121*  BUN 20  CREATININE 1.25*  CALCIUM 9.0  MG 2.1   ------------------------------------------------------------------------------------------------------------------  Cardiac Enzymes No results for input(s): TROPONINI in the last 168 hours. ------------------------------------------------------------------------------------------------------------------  RADIOLOGY:  No results found.  EKG:   Orders placed or performed during the hospital encounter of 11/02/17  . ED EKG  . ED EKG  . EKG 12-Lead  . EKG 12-Lead  . EKG 12-Lead  . EKG 12-Lead  . EKG 12-Lead  . EKG 12-Lead  . EKG 12-Lead  . EKG 12-Lead  . EKG 12-Lead  . EKG 12-Lead  . EKG 12-Lead  . EKG 12-Lead  . EKG 12-Lead  . EKG 12-Lead  . EKG 12-Lead  . EKG 12-Lead    ASSESSMENT AND PLAN:   #1 /right ureteral calculus with right-sided hydronephrosis secondary to obstructing calculus: Status post emergency right ureteral stent by urology .patient has been afebrile after the procedure ESBL UTI, patient on meropenem.  Continue meropenem.  For total of 14 days.  And is on antibiotics since admission on October 24.  #2. non-ST elevation MI, troponin peaked up to 0.80.  Seen by cardiology, and refusing cardiac cath,i continue aspirin, beta-blockers, statins, started on Eliquis.  Likely discharge home with oral meds.  #3. acute respiratory failure with hypoxia secondary to acute  systolic heart failure EF 20% by echo, clinically patient feels better, shortness of breath improved, hypoxia improved, started on p.o. Lasix  patient is refusing cardiac cath .. .  Hypokalemia, replace potassium'   3 .  Diabetes mellitus type 2:, Uncontrolled, adjust Lantus, continue sliding scale coverage. #4 anxiety, depression: Resume  Paxil.  #5 .atrial fibrillation, new onset: Rate controlled low, discontinue amiodarone drip, patient is on p.o. amiodarone 200 mg p.o. twice daily, started on Eliquis.    insomnia: Uses Ambien as needed.  Marland Kitchen  Physical recommends SNF placement.  But patient refused and wants to go home, likely discharge home tomorrow with home health.    Fever blister: Improved with Valtrex.   All the records are reviewed and case discussed with Care Management/Social Workerr. Management plans discussed with the patient, family and they are in agreement.  CODE STATUS: DNR TOTAL TIME TAKING CARE OF THIS PATIENT: 38 minutes coordination of care  More than 50% of time spent in counseling, coordination of care. Patient is too critical to plan for discharge disposition.Marland Kitchen HIGH RISK FOR Cardiopulmonary arrest.  Katha Hamming M.D on 11/12/2017 at 1:17 PM  Between 7am to 6pm - Pager - [phone]  After 6pm go to [email or web address] - password [email or web address] [facility]  [place] [facility]  Office  [phone]  CC: Primary care physician; Corky Downs, MD   Note: This dictation was prepared with Dragon dictation along with smaller phrase technology. Any transcriptional errors that result from this process are unintentional.

## 2017-11-13 DIAGNOSIS — Z23 Encounter for immunization: Secondary | ICD-10-CM | POA: Diagnosis not present

## 2017-11-13 LAB — GLUCOSE, CAPILLARY
Glucose-Capillary: 151 mg/dL — ABNORMAL HIGH (ref 70–99)
Glucose-Capillary: 264 mg/dL — ABNORMAL HIGH (ref 70–99)

## 2017-11-13 MED ORDER — ROSUVASTATIN CALCIUM 40 MG PO TABS: 40.0000 mg | ORAL_TABLET | Freq: Every day | ORAL | 0 refills | Status: DC

## 2017-11-13 MED ORDER — NITROFURANTOIN MONOHYD MACRO 100 MG PO CAPS: 100.0000 mg | ORAL_CAPSULE | Freq: Two times a day (BID) | ORAL | 0 refills | Status: AC

## 2017-11-13 MED ORDER — APIXABAN 5 MG PO TABS: 5.0000 mg | ORAL_TABLET | Freq: Two times a day (BID) | ORAL | 0 refills | Status: DC

## 2017-11-13 MED ORDER — NITROFURANTOIN MACROCRYSTAL 100 MG PO CAPS: 100.0000 mg | ORAL_CAPSULE | Freq: Four times a day (QID) | ORAL | 0 refills | Status: DC

## 2017-11-13 MED ORDER — AMIODARONE HCL 200 MG PO TABS: 200.0000 mg | ORAL_TABLET | Freq: Two times a day (BID) | ORAL | 0 refills | Status: DC

## 2017-11-13 MED ORDER — ALBUTEROL SULFATE HFA 108 (90 BASE) MCG/ACT IN AERS: 2.0000 | INHALATION_SPRAY | Freq: Four times a day (QID) | RESPIRATORY_TRACT | 2 refills | Status: DC | PRN

## 2017-11-13 MED ORDER — LISINOPRIL 2.5 MG PO TABS: 2.5000 mg | ORAL_TABLET | Freq: Every day | ORAL | 0 refills | Status: DC

## 2017-11-13 MED ORDER — CARVEDILOL 3.125 MG PO TABS: 3.1250 mg | ORAL_TABLET | Freq: Two times a day (BID) | ORAL | 0 refills | Status: DC

## 2017-11-13 NOTE — Progress Notes (Signed)
PT Cancellation Note  Patient Details Name: Kristina Archer MRN: [id number] DOB: Oct 23, 1945   Cancelled Treatment:    Reason Eval/Treat Not Completed: Other (comment)   Pt sitting edge of bed eating upon arrival.  Declined services stating she is awaiting discharge home.  Discussed equipment needed.  She said she thought a friend had a walker she could borrow but she was unsure or unsure what style/condition it is is.  She decided she would like one upon discharge to be safe but plans on borrowing a bedside commode if she needs it.  Discussed with care manager.   Danielle Dess 11/13/2017, 10:12 AM

## 2017-11-13 NOTE — Progress Notes (Signed)
discharging home today with home health physical therapy, walker.  Patient refused rehab placement.  Discharge instructions are in the computer.  I spoke with Dr. Alvester Morin from urology recommended he follows up with the urology as an outpatient in 1 to 2 weeks for stent removal, further treatment of stone.  Patient wants to see Dr. Lonna Cobb.  Refused a cardiac cath.  Refused rehab.  Discharge home today with Dilantin for 7 days.  Vantin for 7 days.  Confirmed the dose with pharmacy.  Has history of ESBL UTI, received meropenem 1 week in the hospital, patient has no fever, leukocytosis. Time spent on discharge preparation more than 30 minutes.

## 2017-11-13 NOTE — Progress Notes (Signed)
Discharge instructions explained to pt/ verbalized an understanding/ iv and tele removed/ will transport off unit when ride arrives.  

## 2017-11-13 NOTE — Telephone Encounter (Signed)
Patient being discharged today - 11/4 Patient scheduled 11/14 with C. Brion Aliment

## 2017-11-13 NOTE — Clinical Social Work Note (Signed)
CSW received referral for SNF.  Case discussed with case manager and plan is to discharge home with home health.  CSW to sign off please re-consult if social work needs arise.  Colin Ellers R. Hanya Guerin, MSW, LCSWA [phone]  

## 2017-11-13 NOTE — Care Management Note (Signed)
Case Management Note  Patient Details  Name: Kristina Archer MRN: [id number] Date of Birth: 1945/11/01  Subjective/Objective:    Discharging today to home with home heal;th PT, RN, OT aide.  [facility] notified of discharge.  [facility] to bring walker to bedside for discharge.  Patient states she is ready to go home.  Currently on room air. No further needs at this time.                  Action/Plan:   Expected Discharge Date:  11/11/17               Expected Discharge Plan:  Home w Home Health Services  In-House Referral:     Discharge planning Services  CM Consult, Medication Assistance  Post Acute Care Choice:    Choice offered to:  Patient  DME Arranged:    DME Agency:     HH Arranged:  RN, PT HH Agency:  [facility]  Status of Service:  In process, will continue to follow  If discussed at Long Length of Stay Meetings, dates discussed:    Additional Comments:  Sherren Kerns, RN 11/13/2017, 10:33 AM

## 2017-11-14 DIAGNOSIS — I110 Hypertensive heart disease with heart failure: Secondary | ICD-10-CM | POA: Diagnosis not present

## 2017-11-14 DIAGNOSIS — I5020 Unspecified systolic (congestive) heart failure: Secondary | ICD-10-CM | POA: Diagnosis not present

## 2017-11-14 DIAGNOSIS — E785 Hyperlipidemia, unspecified: Secondary | ICD-10-CM | POA: Diagnosis not present

## 2017-11-14 DIAGNOSIS — E119 Type 2 diabetes mellitus without complications: Secondary | ICD-10-CM | POA: Diagnosis not present

## 2017-11-14 DIAGNOSIS — I4891 Unspecified atrial fibrillation: Secondary | ICD-10-CM | POA: Diagnosis not present

## 2017-11-14 DIAGNOSIS — J449 Chronic obstructive pulmonary disease, unspecified: Secondary | ICD-10-CM | POA: Diagnosis not present

## 2017-11-14 DIAGNOSIS — Z48816 Encounter for surgical aftercare following surgery on the genitourinary system: Secondary | ICD-10-CM | POA: Diagnosis not present

## 2017-11-14 DIAGNOSIS — N132 Hydronephrosis with renal and ureteral calculous obstruction: Secondary | ICD-10-CM | POA: Diagnosis not present

## 2017-11-14 DIAGNOSIS — F419 Anxiety disorder, unspecified: Secondary | ICD-10-CM | POA: Diagnosis not present

## 2017-11-15 DIAGNOSIS — J449 Chronic obstructive pulmonary disease, unspecified: Secondary | ICD-10-CM | POA: Diagnosis not present

## 2017-11-15 DIAGNOSIS — I5020 Unspecified systolic (congestive) heart failure: Secondary | ICD-10-CM | POA: Diagnosis not present

## 2017-11-15 DIAGNOSIS — E119 Type 2 diabetes mellitus without complications: Secondary | ICD-10-CM | POA: Diagnosis not present

## 2017-11-15 DIAGNOSIS — I110 Hypertensive heart disease with heart failure: Secondary | ICD-10-CM | POA: Diagnosis not present

## 2017-11-15 DIAGNOSIS — Z48816 Encounter for surgical aftercare following surgery on the genitourinary system: Secondary | ICD-10-CM | POA: Diagnosis not present

## 2017-11-15 DIAGNOSIS — I4891 Unspecified atrial fibrillation: Secondary | ICD-10-CM | POA: Diagnosis not present

## 2017-11-15 DIAGNOSIS — N132 Hydronephrosis with renal and ureteral calculous obstruction: Secondary | ICD-10-CM | POA: Diagnosis not present

## 2017-11-15 DIAGNOSIS — F419 Anxiety disorder, unspecified: Secondary | ICD-10-CM | POA: Diagnosis not present

## 2017-11-15 DIAGNOSIS — E785 Hyperlipidemia, unspecified: Secondary | ICD-10-CM | POA: Diagnosis not present

## 2017-11-15 NOTE — Discharge Summary (Signed)
Kristina Archer, is a 72 y.o. female  DOB 13-Oct-1945  MRN [id number].  Admission date:  11/02/2017  Admitting Physician  Katha Hamming, MD  Discharge Date:  11/13/2017   Primary MD  Corky Downs, MD  Recommendations for primary care physician for things to follow:   Follow-up with PCP in 1 week Follow-up with Dr. Jamelle Rushing in 2 weeks  Admission Diagnosis  Kidney stone [N20.0] Troponin I above reference range [R79.89] Pre-op chest exam [Z01.811] Acute pyelonephritis [N10]   Discharge Diagnosis  Kidney stone [N20.0] Troponin I above reference range [R79.89] Pre-op chest exam [Z01.811] Acute pyelonephritis [N10]   Active Problems:   Right ureteral stone   CHF (congestive heart failure) (HCC)      Past Medical History:  Diagnosis Date  . Anxiety   . Cardiomyopathy (HCC)    a. 10/2017 Echo: EF 20-25%, diff HK, mild MR. Nl RV size.  . DDD (degenerative disc disease), lumbar   . Diverticulosis   . GERD (gastroesophageal reflux disease)   . High blood pressure   . High cholesterol   . LBBB (left bundle branch block)   . Nephrolithiasis   . Sciatica   . Type II diabetes mellitus (HCC)     Past Surgical History:  Procedure Laterality Date  . ABDOMINAL HYSTERECTOMY    . CHOLECYSTECTOMY    . CYSTOSCOPY W/ RETROGRADES Bilateral 05/24/2017   Procedure: CYSTOSCOPY WITH RETROGRADE PYELOGRAM;  Surgeon: Vanna Scotland, MD;  Location: [facility] ORS;  Service: Urology;  Laterality: Bilateral;  . CYSTOSCOPY W/ URETERAL STENT PLACEMENT Right 11/02/2017   Procedure: CYSTOSCOPY WITH RETROGRADE PYELOGRAM/URETERAL STENT PLACEMENT;  Surgeon: Crista Elliot, MD;  Location: [facility] ORS;  Service: Urology;  Laterality: Right;  . CYSTOSCOPY/URETEROSCOPY/HOLMIUM LASER/STENT PLACEMENT Left 05/24/2017   Procedure:  CYSTOSCOPY/URETEROSCOPY/HOLMIUM LASER/STENT PLACEMENT;  Surgeon: Vanna Scotland, MD;  Location: [facility] ORS;  Service: Urology;  Laterality: Left;       History of present illness and  Hospital Course:     Kindly see H&P for history of present illness and admission details, please review complete Labs, Consult reports and Test reports for all details in brief  HPI  from the history and physical done on the day of admission 72 year old female patient with essential hypertension, diabetes mellitus type 2 came in because of right flank pain associated with nausea, found to have right hydronephrosis, tending to 13 mm stone at right UPJ.  Admitted for obstructive uropathy due to right ureteral stone.  Admitted for the same, seen by urology, had emergency urological procedure for her right ureteral stent.   Hospital Course  Obstructive uropathy with a right ureteral calculus, right hydronephrosis to, patient had emergency ureteral stent by urology, Dr. Alvester Morin.  Has been afebrile, urine culture showed ESBL, patient received meropenem, discharged home with Macrobid 100 p.o. twice daily for 7 days after discussing with pharmacist.  I spoke with Dr. Alvester Morin, he recommended that she follows up with urology in 1 to 2 weeks, patient wants  to follow-up with Dr. Lonna Cobb. 2.  Non-ST elevation MI with troponin up to 0.8, seen by cardiology, patient had shortness of breath, atrial fibrillation with RVR on telemetry so transferred to stepdown unit, received IV Lasix, seen by cardiology from Dr. Clifton Custard.  Initially patient required high flow nasal cannula gradually weaned off to room air.  Patient continued on aspirin, beta-blockers, heparin drip, statins.  Patient echocardiogram showed ejection fraction of 20% with diffuse hypokinesia, possible regional wall motion abnormalities.  Patient refused cardiac  cath despite repeatedly asking by cardiologist and also myself.  She said she does not want cardiac cath.  So patient  started on Eliquis. 3.  Atrial fibrillation with RVR, initially required amiodarone drip converted to normal sinus rhythm.  At home with amiodarone, Eliquis. 4.  Acute systolic heart failure with EF 20 to 25%.  Patient received IV Lasix,, patient shortness of breath improved, changed to p.o. Lasix.  Cardiology to add spironolactone as an outpatient.  Discharge home with p.o. amiodarone, Coreg, Lasix, lisinopril. 5.  Non-ST elevation MI, declined cardiac cath, medical treatment only, discharged home with oral anticoagulants, high intensity statins, beta-blockers. * 6 .diabetes mellitus type 2: Patient is on metformin, Amaryl, resume at discharge. #7. deconditioning,'Physical  therapy recommended rehab placement but patient refused and wants to go home so we will discharge her home with home health physical therapy.  Summary patient 72 year old female patient admitted for right ureteral stone with obstructive uropathy, patient had emergency right ureteral stone, letter on postoperative course complicated by shortness of breath, A. fib with RVR requiring transferring her to ICU for oxygen, IV amiodarone drip, IV Lasix, patient did not want to be intubated and signed DNR.  Patient symptoms improved with IV Lasix, amiodarone drip, patient is able to, 4 x 4 nasal cannula and monitor telemetry.  Patient echocardiogram showed EF 20% with wall motion abnormality but patient refused cardiac cath, medical treatment advised by cardiology because she refused cardiac cath.  Discharged home with aspirin, beta-blockers, statins, Eliquis, amiodarone, , ACE inhibitors.  Follow-up with [facility] cardiology as an outpatient to start spironolactone.  Discharge Condition: Stable   Follow UP   Contact information for follow-up providers    Corky Downs, MD. Schedule an appointment as soon as possible for a visit on 11/21/2017.   Specialty:  Internal Medicine Why:  Appointment Time: @ 10:15am Contact  information: [address] [place] [address] [phone]        Antonieta Iba, MD.   Specialty:  Cardiology Contact information: [address] [place] [address] [phone]        Creig Hines, NP. Schedule an appointment as soon as possible for a visit on 11/23/2017.   Specialties:  Nurse Practitioner, Cardiology, Radiology Why:  Appointment Time: @ 10:30am Contact information: [address] [place] [address] [phone]        Riki Altes, MD. Schedule an appointment as soon as possible for a visit on 11/24/2017.   Specialty:  Urology Why:  Appointment Time: @ 12:45pm Contact information: [address] [place] [address] [phone]            Contact information for after-discharge care    Destination    [facility].   Service:  Skilled Nursing Why:  SNF Contact information: [address] [place] [address] [phone]                    Discharge Instructions  and  Discharge Medications     Allergies as of 11/13/2017      Reactions   Codeine Nausea Only      Medication List    STOP taking these medications   amLODipine-benazepril 5-10 MG capsule Commonly known as:  LOTREL   HYDROcodone-acetaminophen 5-325 MG tablet Commonly known as:  NORCO/VICODIN   sulfamethoxazole-trimethoprim 800-160 MG tablet Commonly known as:  BACTRIM DS,SEPTRA DS     TAKE these medications  albuterol 108 (90 Base) MCG/ACT inhaler Commonly known as:  PROVENTIL HFA;VENTOLIN HFA Inhale 2 puffs into the lungs every 6 (six) hours as needed for wheezing or shortness of breath.   amiodarone 200 MG tablet Commonly known as:  PACERONE Take 1 tablet (200 mg total) by mouth 2 (two) times daily.   apixaban 5 MG Tabs tablet Commonly known as:  ELIQUIS Take 1 tablet (5 mg total) by mouth 2 (two) times daily.   carvedilol 3.125  MG tablet Commonly known as:  COREG Take 1 tablet (3.125 mg total) by mouth 2 (two) times daily with a meal.   DEXILANT 60 MG capsule Generic drug:  dexlansoprazole Take 60 mg by mouth daily before breakfast.   docusate sodium 100 MG capsule Commonly known as:  COLACE Take 1 capsule (100 mg total) by mouth 2 (two) times daily.   furosemide 20 MG tablet Commonly known as:  LASIX Take 20 mg by mouth daily.   gabapentin 100 MG capsule Commonly known as:  NEURONTIN Take 100 mg by mouth 3 (three) times daily as needed (back pain).   glimepiride 2 MG tablet Commonly known as:  AMARYL Take 2 mg by mouth daily with breakfast.   lisinopril 2.5 MG tablet Commonly known as:  PRINIVIL,ZESTRIL Take 1 tablet (2.5 mg total) by mouth daily.   metFORMIN 500 MG 24 hr tablet Commonly known as:  GLUCOPHAGE-XR Take 500 mg by mouth 2 (two) times daily.   multivitamin with minerals Tabs tablet Take 1 tablet by mouth daily.   nitrofurantoin (macrocrystal-monohydrate) 100 MG capsule Commonly known as:  MACROBID Take 1 capsule (100 mg total) by mouth 2 (two) times daily for 7 days. What changed:  You were already taking a medication with the same name, and this prescription was added. Make sure you understand how and when to take each.   nitrofurantoin (macrocrystal-monohydrate) 100 MG capsule Commonly known as:  MACROBID Take 1 capsule (100 mg total) by mouth every 12 (twelve) hours for 7 days. What changed:  Another medication with the same name was added. Make sure you understand how and when to take each.   oxybutynin 5 MG tablet Commonly known as:  DITROPAN Take 1 tablet (5 mg total) by mouth every 8 (eight) hours as needed for bladder spasms.   PARoxetine 20 MG tablet Commonly known as:  PAXIL Take 20 mg by mouth at bedtime.   rosuvastatin 40 MG tablet Commonly known as:  CRESTOR Take 1 tablet (40 mg total) by mouth daily. What changed:    medication strength  how much to  take   sitaGLIPtin 100 MG tablet Commonly known as:  JANUVIA Take 100 mg by mouth daily.   tamsulosin 0.4 MG Caps capsule Commonly known as:  FLOMAX Take 1 capsule (0.4 mg total) by mouth daily.         Diet and Activity recommendation: See Discharge Instructions above   Consults obtained ;cardiology, urology  Major procedures and Radiology Reports - PLEASE review detailed and final reports for all details, in brief -      Dg Abd 1 View  Result Date: 11/03/2017 CLINICAL DATA:  72 year old female with increased pain after urologic stent placement. EXAM: ABDOMEN - 1 VIEW COMPARISON:  CT Abdomen and Pelvis 11/02/2017. FINDINGS: Two supine views at 1520 hours. Right double-J ureteral stent has been placed. The distal pigtail is appropriately located in the lower central pelvis. The proximal pigtail is partially on looped. The 10 millimeter right ureteropelvic junction obstructing calculus  seen by CT yesterday is not clearly identified. Numerous pelvic phleboliths redemonstrated. New moderate gaseous distension of the stomach. Normal bowel gas pattern otherwise. Stable cholecystectomy clips. No acute osseous abnormality identified. IMPRESSION: 1. Right double-J ureteral stent placed. The proximal pigtail of the catheter is partially un-coiled. The distal pigtail appears satisfactory. The 10 mm right UPJ calculus seen by CT yesterday is not clearly identified. 2. Moderate new gaseous distension of the stomach, but normal bowel gas pattern otherwise. Electronically Signed   By: Odessa Fleming M.D.   On: 11/03/2017 15:41   Dg Chest Port 1 View  Result Date: 11/07/2017 CLINICAL DATA:  Atelectasis EXAM: PORTABLE CHEST 1 VIEW COMPARISON:  11/05/2017 FINDINGS: Cardiomegaly with vascular congestion. No confluent opacities, effusions or overt edema. No acute bony abnormality. IMPRESSION: Cardiomegaly, vascular congestion. Electronically Signed   By: Charlett Nose M.D.   On: 11/07/2017 07:52   Dg Chest  Port 1 View  Result Date: 11/05/2017 CLINICAL DATA:  CHF EXAM: PORTABLE CHEST 1 VIEW COMPARISON:  11/03/2017 FINDINGS: Lungs are clear.  No pleural effusion or pneumothorax. The heart is top-normal in size. IMPRESSION: No evidence of acute cardiopulmonary disease. Electronically Signed   By: Charline Bills M.D.   On: 11/05/2017 08:46   Dg Chest Port 1 View  Result Date: 11/03/2017 CLINICAL DATA:  72 year old female with increasing shortness of breath after urologic stent placement. Wheezing. EXAM: PORTABLE CHEST 1 VIEW COMPARISON:  CT Abdomen and Pelvis 11/02/2017. FINDINGS: The lung bases were clear on the CT yesterday. AP seated view today at 1515 hours. Stable mild cardiomegaly. Other mediastinal contours are within normal limits. Visualized tracheal air column is within normal limits. Borderline to mild increased pulmonary interstitial markings now. No pneumothorax or consolidation. Questionable small pleural effusions. Negative visible bowel gas pattern. No acute osseous abnormality identified. IMPRESSION: 1. Mild pulmonary interstitial markings and possibly new small pleural effusions suspicious for Acute Pulmonary Edema. 2. Stable mild cardiomegaly. Electronically Signed   By: Odessa Fleming M.D.   On: 11/03/2017 15:39   Ct Renal Stone Study  Result Date: 11/02/2017 CLINICAL DATA:  Flank pain EXAM: CT ABDOMEN AND PELVIS WITHOUT CONTRAST TECHNIQUE: Multidetector CT imaging of the abdomen and pelvis was performed following the standard protocol without IV contrast. COMPARISON:  Ultrasound 07/21/2017, radiograph 05/22/2017 FINDINGS: Lower chest: Lung bases demonstrate no acute consolidation or effusion. The heart size is normal. Hepatobiliary: No focal liver abnormality is seen. There may be subtle contour nodularity of the surface of the liver. Status post cholecystectomy. No biliary dilatation. Pancreas: Unremarkable. No pancreatic ductal dilatation or surrounding inflammatory changes. Spleen: Normal  in size without focal abnormality. Adrenals/Urinary Tract: Adrenal glands are normal. Moderate right perinephric stranding. Moderate right hydronephrosis, secondary to a 10 x 13 mm stone in the proximal right ureter at the UPJ. Additional 8 mm stone lower pole right kidney. 3 mm stone lower pole left kidney. Bladder unremarkable Stomach/Bowel: Stomach is within normal limits. Appendix appears normal. No evidence of bowel wall thickening, distention, or inflammatory changes. Left colon diverticular disease without acute inflammatory process Vascular/Lymphatic: Moderate aortic atherosclerosis. No aneurysm. No significantly enlarged lymph nodes. Reproductive: Status post hysterectomy.  3.1 cm right ovarian cyst. Other: Negative for free air or free fluid. Musculoskeletal: Mild scoliosis and degenerative change of the spine IMPRESSION: 1. Moderate right hydronephrosis, secondary to a 10 x 13 mm stone at the right UPJ. 2. Intrarenal calculi bilaterally 3. Left colon diverticular disease without acute inflammation 4. 3.1 cm right adnexal cyst. Recommend correlation  with pelvic ultrasound which may be performed on a nonemergent basis. Electronically Signed   By: Jasmine Pang M.D.   On: 11/02/2017 14:34    Micro Results    No results found for this or any previous visit (from the past 240 hour(s)).     Today   Subjective:   Anniya Whiters today has no headache,no chest abdominal pain,no new weakness tingling or numbness, feels much better wants to go home today.   Objective:   Blood pressure (!) 120/56, pulse 69, temperature 97.8 F (36.6 C), temperature source Oral, resp. rate 18, height 5\' 2"  (1.575 m), weight 86 kg, SpO2 95 %.  No intake or output data in the 24 hours ending 11/15/17 1615  Exam Awake Alert, Oriented x 3, No new F.N deficits, Normal affect [place].AT,PERRAL Supple Neck,No JVD, No cervical lymphadenopathy appriciated.  Symmetrical Chest wall movement, Good air movement bilaterally,  CTAB RRR,No Gallops,Rubs or new Murmurs, No Parasternal Heave +ve B.Sounds, Abd Soft, Non tender, No organomegaly appriciated, No rebound -guarding or rigidity. No Cyanosis, Clubbing or edema, No new Rash or bruise  Data Review   CBC w Diff:  Lab Results  Component Value Date   WBC 10.0 11/10/2017   HGB 11.1 (L) 11/10/2017   HGB 14.7 10/11/2011   HCT 35.0 (L) 11/10/2017   HCT 41.4 10/11/2011   PLT 148 (L) 11/10/2017   PLT 166 10/11/2011   LYMPHOPCT 11 11/07/2017   MONOPCT 11 11/07/2017   EOSPCT 0 11/07/2017   BASOPCT 0 11/07/2017    CMP:  Lab Results  Component Value Date   NA 137 11/10/2017   NA 143 10/11/2011   K 3.7 11/10/2017   K 3.4 (L) 10/11/2011   CL 101 11/10/2017   CL 106 10/11/2011   CO2 27 11/10/2017   CO2 28 10/11/2011   BUN 20 11/10/2017   BUN 9 10/11/2011   CREATININE 1.25 (H) 11/10/2017   CREATININE 1.04 10/11/2011   PROT 7.4 11/02/2017   PROT 8.6 (H) 10/11/2011   ALBUMIN 4.0 11/02/2017   ALBUMIN 4.3 10/11/2011   BILITOT 1.5 (H) 11/02/2017   BILITOT 0.8 10/11/2011   ALKPHOS 98 11/02/2017   ALKPHOS 161 (H) 10/11/2011   AST 67 (H) 11/02/2017   AST 34 10/11/2011   ALT 49 (H) 11/02/2017   ALT 42 10/11/2011  .   Total Time in preparing paper work, data evaluation and todays exam - 35 minutes  Katha Hamming M.D on 11/13/2017 at 4:15 PM    Note: This dictation was prepared with Dragon dictation along with smaller phrase technology. Any transcriptional errors that result from this process are unintentional.

## 2017-11-15 NOTE — Telephone Encounter (Signed)
Patient contacted regarding discharge from [facility] on Monday 11/13/17.  Patient understands to follow up with provider Ward Givens, NP on 11/23/17 at 10:30 am at the [facility] office. However, the patient states "I don't know if I'll keep that, I'm seeing my doctor before that." I advised her we will keep this scheduled for now and if her PCP/ she feels she does not need this appointment or she doesn't want to keep it, she can call back to cancel.  She is agreeable and voices understanding.  Patient understands discharge instructions? Yes Patient understands medications and regiment? Yes Patient understands to bring all medications to this visit? Yes

## 2017-11-16 ENCOUNTER — Ambulatory Visit: Payer: Medicare HMO | Admitting: Nurse Practitioner

## 2017-11-17 ENCOUNTER — Other Ambulatory Visit: Payer: Self-pay

## 2017-11-17 DIAGNOSIS — Z48816 Encounter for surgical aftercare following surgery on the genitourinary system: Secondary | ICD-10-CM | POA: Diagnosis not present

## 2017-11-17 DIAGNOSIS — J449 Chronic obstructive pulmonary disease, unspecified: Secondary | ICD-10-CM | POA: Diagnosis not present

## 2017-11-17 DIAGNOSIS — N132 Hydronephrosis with renal and ureteral calculous obstruction: Secondary | ICD-10-CM | POA: Diagnosis not present

## 2017-11-17 DIAGNOSIS — E119 Type 2 diabetes mellitus without complications: Secondary | ICD-10-CM | POA: Diagnosis not present

## 2017-11-17 DIAGNOSIS — I4891 Unspecified atrial fibrillation: Secondary | ICD-10-CM | POA: Diagnosis not present

## 2017-11-17 DIAGNOSIS — F419 Anxiety disorder, unspecified: Secondary | ICD-10-CM | POA: Diagnosis not present

## 2017-11-17 DIAGNOSIS — I5020 Unspecified systolic (congestive) heart failure: Secondary | ICD-10-CM | POA: Diagnosis not present

## 2017-11-17 DIAGNOSIS — I110 Hypertensive heart disease with heart failure: Secondary | ICD-10-CM | POA: Diagnosis not present

## 2017-11-17 DIAGNOSIS — E785 Hyperlipidemia, unspecified: Secondary | ICD-10-CM | POA: Diagnosis not present

## 2017-11-17 NOTE — Patient Outreach (Signed)
[facility]) Care Management  11/17/2017  KATORA FINI [birth year] [id number]  EMMI: general discharge red alert Referral date: 11/17/17 Referral reason: unfilled prescriptions Insurance: [employer] Day # 1  Telephone call to patient regarding EMMI general discharge. Patient states she has someone at her home now and will need to talk at a later date.   PLAN: RNCM will attempt 2nd telephone call to patient within 4 business days.  RNCm will send patient outreach letter.   George Ina RN,BSN,CCM [facility] Telephonic  [phone]

## 2017-11-20 ENCOUNTER — Ambulatory Visit: Payer: Self-pay

## 2017-11-20 ENCOUNTER — Other Ambulatory Visit: Payer: Self-pay

## 2017-11-20 NOTE — Patient Outreach (Signed)
[facility]) Care Management  11/20/2017  Kristina Archer [birth year] [id number]     EMMI-General Discharge RED ON EMMI ALERT Day # 1 Date: 11/15/17 Red Alert Reason: " Unfilled prescriptions? Yes"    EMMI-General Discharge RED ON EMMI ALERT Day # 4 Date: 11/18/17 Red Alert Reason: " Lost interest in things? Yes  Sad/hopeless/anxious/empty? Yes"   Outreach attempt # 1 to patient.  Spoke with patient briefly as she voices she is going for PCP appt soon. She reports that she is doing fairly well since return home. Reviewed and addressed red alerts with patient. She states that she has some meds that she will needs refills on soon. She has already planned to take all her pill bottles to MD appt today to have MD review her meds and med changes since discharge. She denies currently being out of all meds. Patient denies any emotional/ood changes. She reports she did not understand questions. She voices no issue with transportation. Patient states that she is getting [facility] services via [facility] and RN has already been out over the weekend to see her and will be back later this week. She denies any further RN CM needs or concerns at this time. Patient has completed automated post discharge EMMI-General calls and made aware. She voiced understanding and was appreciative of follow up call.      Plan: RN CM will close case at this time.    [name], RN,BSN,CCM [facility] Care Management Telephonic Care Management Coordinator Direct Phone: [phone] Toll Free: [phone] Fax: [phone]

## 2017-11-21 ENCOUNTER — Other Ambulatory Visit: Payer: Self-pay

## 2017-11-21 DIAGNOSIS — R5381 Other malaise: Secondary | ICD-10-CM | POA: Diagnosis not present

## 2017-11-21 DIAGNOSIS — I10 Essential (primary) hypertension: Secondary | ICD-10-CM | POA: Diagnosis not present

## 2017-11-21 NOTE — Telephone Encounter (Signed)
This encounter was created in error - please disregard.

## 2017-11-21 NOTE — Patient Outreach (Signed)
[facility] Care Management  11/21/2017  Kristina MooreBrenda A Archer 12-Jan-1945 [id number]   EMMI: general discharge red alert Referral date: 11/17/17 Referral reason: unfilled prescriptions [facility]: [facility]  Return call to patient regarding EMMI general discharge follow up. HIPAA verified by patient. Patient states she wanted to know if the nurse would home health would be returning this week. Patient states she is not sure if she has the paperwork with the contact phone number.   RNCM provided patient contact name of home health agency [facility] and contact phone number.  Advised patient to call [facility] to request next home visit date with nurse. Patient verbalized understanding and appreciation of call.  Patient denied any further needs at [facility].   PLAN; RNCM will close due to patient being assessed and having no further needs.

## 2017-11-22 DIAGNOSIS — J449 Chronic obstructive pulmonary disease, unspecified: Secondary | ICD-10-CM | POA: Diagnosis not present

## 2017-11-22 DIAGNOSIS — E119 Type 2 diabetes mellitus without complications: Secondary | ICD-10-CM | POA: Diagnosis not present

## 2017-11-22 DIAGNOSIS — I110 Hypertensive heart disease with heart failure: Secondary | ICD-10-CM | POA: Diagnosis not present

## 2017-11-22 DIAGNOSIS — I5020 Unspecified systolic (congestive) heart failure: Secondary | ICD-10-CM | POA: Diagnosis not present

## 2017-11-22 DIAGNOSIS — Z48816 Encounter for surgical aftercare following surgery on the genitourinary system: Secondary | ICD-10-CM | POA: Diagnosis not present

## 2017-11-22 DIAGNOSIS — N132 Hydronephrosis with renal and ureteral calculous obstruction: Secondary | ICD-10-CM | POA: Diagnosis not present

## 2017-11-22 DIAGNOSIS — E785 Hyperlipidemia, unspecified: Secondary | ICD-10-CM | POA: Diagnosis not present

## 2017-11-22 DIAGNOSIS — F419 Anxiety disorder, unspecified: Secondary | ICD-10-CM | POA: Diagnosis not present

## 2017-11-22 DIAGNOSIS — I4891 Unspecified atrial fibrillation: Secondary | ICD-10-CM | POA: Diagnosis not present

## 2017-11-23 ENCOUNTER — Ambulatory Visit: Payer: Medicare HMO | Admitting: Nurse Practitioner

## 2017-11-24 ENCOUNTER — Ambulatory Visit: Payer: Medicare HMO | Admitting: Urology

## 2017-11-24 DIAGNOSIS — J449 Chronic obstructive pulmonary disease, unspecified: Secondary | ICD-10-CM | POA: Diagnosis not present

## 2017-11-24 DIAGNOSIS — I5020 Unspecified systolic (congestive) heart failure: Secondary | ICD-10-CM | POA: Diagnosis not present

## 2017-11-24 DIAGNOSIS — I110 Hypertensive heart disease with heart failure: Secondary | ICD-10-CM | POA: Diagnosis not present

## 2017-11-24 DIAGNOSIS — E785 Hyperlipidemia, unspecified: Secondary | ICD-10-CM | POA: Diagnosis not present

## 2017-11-24 DIAGNOSIS — F419 Anxiety disorder, unspecified: Secondary | ICD-10-CM | POA: Diagnosis not present

## 2017-11-24 DIAGNOSIS — N132 Hydronephrosis with renal and ureteral calculous obstruction: Secondary | ICD-10-CM | POA: Diagnosis not present

## 2017-11-24 DIAGNOSIS — E119 Type 2 diabetes mellitus without complications: Secondary | ICD-10-CM | POA: Diagnosis not present

## 2017-11-24 DIAGNOSIS — Z48816 Encounter for surgical aftercare following surgery on the genitourinary system: Secondary | ICD-10-CM | POA: Diagnosis not present

## 2017-11-24 DIAGNOSIS — I4891 Unspecified atrial fibrillation: Secondary | ICD-10-CM | POA: Diagnosis not present

## 2017-11-29 DIAGNOSIS — Z48816 Encounter for surgical aftercare following surgery on the genitourinary system: Secondary | ICD-10-CM | POA: Diagnosis not present

## 2017-11-29 DIAGNOSIS — J449 Chronic obstructive pulmonary disease, unspecified: Secondary | ICD-10-CM | POA: Diagnosis not present

## 2017-11-29 DIAGNOSIS — I5020 Unspecified systolic (congestive) heart failure: Secondary | ICD-10-CM | POA: Diagnosis not present

## 2017-11-29 DIAGNOSIS — I110 Hypertensive heart disease with heart failure: Secondary | ICD-10-CM | POA: Diagnosis not present

## 2017-11-29 DIAGNOSIS — N132 Hydronephrosis with renal and ureteral calculous obstruction: Secondary | ICD-10-CM | POA: Diagnosis not present

## 2017-11-29 DIAGNOSIS — E119 Type 2 diabetes mellitus without complications: Secondary | ICD-10-CM | POA: Diagnosis not present

## 2017-11-29 DIAGNOSIS — F419 Anxiety disorder, unspecified: Secondary | ICD-10-CM | POA: Diagnosis not present

## 2017-11-29 DIAGNOSIS — E785 Hyperlipidemia, unspecified: Secondary | ICD-10-CM | POA: Diagnosis not present

## 2017-11-29 DIAGNOSIS — I4891 Unspecified atrial fibrillation: Secondary | ICD-10-CM | POA: Diagnosis not present

## 2017-11-30 DIAGNOSIS — F419 Anxiety disorder, unspecified: Secondary | ICD-10-CM | POA: Diagnosis not present

## 2017-11-30 DIAGNOSIS — J449 Chronic obstructive pulmonary disease, unspecified: Secondary | ICD-10-CM | POA: Diagnosis not present

## 2017-11-30 DIAGNOSIS — I5020 Unspecified systolic (congestive) heart failure: Secondary | ICD-10-CM | POA: Diagnosis not present

## 2017-11-30 DIAGNOSIS — I4891 Unspecified atrial fibrillation: Secondary | ICD-10-CM | POA: Diagnosis not present

## 2017-11-30 DIAGNOSIS — I110 Hypertensive heart disease with heart failure: Secondary | ICD-10-CM | POA: Diagnosis not present

## 2017-11-30 DIAGNOSIS — Z48816 Encounter for surgical aftercare following surgery on the genitourinary system: Secondary | ICD-10-CM | POA: Diagnosis not present

## 2017-11-30 DIAGNOSIS — N132 Hydronephrosis with renal and ureteral calculous obstruction: Secondary | ICD-10-CM | POA: Diagnosis not present

## 2017-11-30 DIAGNOSIS — E785 Hyperlipidemia, unspecified: Secondary | ICD-10-CM | POA: Diagnosis not present

## 2017-11-30 DIAGNOSIS — E119 Type 2 diabetes mellitus without complications: Secondary | ICD-10-CM | POA: Diagnosis not present

## 2017-12-01 DIAGNOSIS — J449 Chronic obstructive pulmonary disease, unspecified: Secondary | ICD-10-CM | POA: Diagnosis not present

## 2017-12-01 DIAGNOSIS — F419 Anxiety disorder, unspecified: Secondary | ICD-10-CM | POA: Diagnosis not present

## 2017-12-01 DIAGNOSIS — E785 Hyperlipidemia, unspecified: Secondary | ICD-10-CM | POA: Diagnosis not present

## 2017-12-01 DIAGNOSIS — E119 Type 2 diabetes mellitus without complications: Secondary | ICD-10-CM | POA: Diagnosis not present

## 2017-12-01 DIAGNOSIS — I110 Hypertensive heart disease with heart failure: Secondary | ICD-10-CM | POA: Diagnosis not present

## 2017-12-01 DIAGNOSIS — I4891 Unspecified atrial fibrillation: Secondary | ICD-10-CM | POA: Diagnosis not present

## 2017-12-01 DIAGNOSIS — Z48816 Encounter for surgical aftercare following surgery on the genitourinary system: Secondary | ICD-10-CM | POA: Diagnosis not present

## 2017-12-01 DIAGNOSIS — N132 Hydronephrosis with renal and ureteral calculous obstruction: Secondary | ICD-10-CM | POA: Diagnosis not present

## 2017-12-01 DIAGNOSIS — I5020 Unspecified systolic (congestive) heart failure: Secondary | ICD-10-CM | POA: Diagnosis not present

## 2017-12-03 DIAGNOSIS — Z48816 Encounter for surgical aftercare following surgery on the genitourinary system: Secondary | ICD-10-CM | POA: Diagnosis not present

## 2017-12-03 DIAGNOSIS — E119 Type 2 diabetes mellitus without complications: Secondary | ICD-10-CM | POA: Diagnosis not present

## 2017-12-03 DIAGNOSIS — E785 Hyperlipidemia, unspecified: Secondary | ICD-10-CM | POA: Diagnosis not present

## 2017-12-03 DIAGNOSIS — N132 Hydronephrosis with renal and ureteral calculous obstruction: Secondary | ICD-10-CM | POA: Diagnosis not present

## 2017-12-03 DIAGNOSIS — I110 Hypertensive heart disease with heart failure: Secondary | ICD-10-CM | POA: Diagnosis not present

## 2017-12-03 DIAGNOSIS — F419 Anxiety disorder, unspecified: Secondary | ICD-10-CM | POA: Diagnosis not present

## 2017-12-03 DIAGNOSIS — I5020 Unspecified systolic (congestive) heart failure: Secondary | ICD-10-CM | POA: Diagnosis not present

## 2017-12-03 DIAGNOSIS — I4891 Unspecified atrial fibrillation: Secondary | ICD-10-CM | POA: Diagnosis not present

## 2017-12-03 DIAGNOSIS — J449 Chronic obstructive pulmonary disease, unspecified: Secondary | ICD-10-CM | POA: Diagnosis not present

## 2017-12-06 DIAGNOSIS — E785 Hyperlipidemia, unspecified: Secondary | ICD-10-CM | POA: Diagnosis not present

## 2017-12-06 DIAGNOSIS — F419 Anxiety disorder, unspecified: Secondary | ICD-10-CM | POA: Diagnosis not present

## 2017-12-06 DIAGNOSIS — E119 Type 2 diabetes mellitus without complications: Secondary | ICD-10-CM | POA: Diagnosis not present

## 2017-12-06 DIAGNOSIS — I4891 Unspecified atrial fibrillation: Secondary | ICD-10-CM | POA: Diagnosis not present

## 2017-12-06 DIAGNOSIS — I5020 Unspecified systolic (congestive) heart failure: Secondary | ICD-10-CM | POA: Diagnosis not present

## 2017-12-06 DIAGNOSIS — J449 Chronic obstructive pulmonary disease, unspecified: Secondary | ICD-10-CM | POA: Diagnosis not present

## 2017-12-06 DIAGNOSIS — I110 Hypertensive heart disease with heart failure: Secondary | ICD-10-CM | POA: Diagnosis not present

## 2017-12-06 DIAGNOSIS — Z48816 Encounter for surgical aftercare following surgery on the genitourinary system: Secondary | ICD-10-CM | POA: Diagnosis not present

## 2017-12-06 DIAGNOSIS — N132 Hydronephrosis with renal and ureteral calculous obstruction: Secondary | ICD-10-CM | POA: Diagnosis not present

## 2017-12-14 DIAGNOSIS — N132 Hydronephrosis with renal and ureteral calculous obstruction: Secondary | ICD-10-CM | POA: Diagnosis not present

## 2017-12-14 DIAGNOSIS — J449 Chronic obstructive pulmonary disease, unspecified: Secondary | ICD-10-CM | POA: Diagnosis not present

## 2017-12-14 DIAGNOSIS — F419 Anxiety disorder, unspecified: Secondary | ICD-10-CM | POA: Diagnosis not present

## 2017-12-14 DIAGNOSIS — E119 Type 2 diabetes mellitus without complications: Secondary | ICD-10-CM | POA: Diagnosis not present

## 2017-12-14 DIAGNOSIS — I4891 Unspecified atrial fibrillation: Secondary | ICD-10-CM | POA: Diagnosis not present

## 2017-12-14 DIAGNOSIS — E785 Hyperlipidemia, unspecified: Secondary | ICD-10-CM | POA: Diagnosis not present

## 2017-12-14 DIAGNOSIS — I5020 Unspecified systolic (congestive) heart failure: Secondary | ICD-10-CM | POA: Diagnosis not present

## 2017-12-14 DIAGNOSIS — I110 Hypertensive heart disease with heart failure: Secondary | ICD-10-CM | POA: Diagnosis not present

## 2017-12-14 DIAGNOSIS — Z48816 Encounter for surgical aftercare following surgery on the genitourinary system: Secondary | ICD-10-CM | POA: Diagnosis not present

## 2017-12-15 DIAGNOSIS — J449 Chronic obstructive pulmonary disease, unspecified: Secondary | ICD-10-CM | POA: Diagnosis not present

## 2017-12-15 DIAGNOSIS — Z48816 Encounter for surgical aftercare following surgery on the genitourinary system: Secondary | ICD-10-CM | POA: Diagnosis not present

## 2017-12-15 DIAGNOSIS — E119 Type 2 diabetes mellitus without complications: Secondary | ICD-10-CM | POA: Diagnosis not present

## 2017-12-15 DIAGNOSIS — N132 Hydronephrosis with renal and ureteral calculous obstruction: Secondary | ICD-10-CM | POA: Diagnosis not present

## 2017-12-15 DIAGNOSIS — I110 Hypertensive heart disease with heart failure: Secondary | ICD-10-CM | POA: Diagnosis not present

## 2017-12-15 DIAGNOSIS — F419 Anxiety disorder, unspecified: Secondary | ICD-10-CM | POA: Diagnosis not present

## 2017-12-15 DIAGNOSIS — E785 Hyperlipidemia, unspecified: Secondary | ICD-10-CM | POA: Diagnosis not present

## 2017-12-15 DIAGNOSIS — I5020 Unspecified systolic (congestive) heart failure: Secondary | ICD-10-CM | POA: Diagnosis not present

## 2017-12-15 DIAGNOSIS — I4891 Unspecified atrial fibrillation: Secondary | ICD-10-CM | POA: Diagnosis not present

## 2017-12-17 ENCOUNTER — Other Ambulatory Visit: Payer: Self-pay

## 2017-12-17 ENCOUNTER — Emergency Department: Payer: Medicare HMO

## 2017-12-17 ENCOUNTER — Inpatient Hospital Stay
Admission: EM | Admit: 2017-12-17 | Discharge: 2017-12-22 | DRG: 871 | Disposition: A | Discharge status: Skilled Nursing Facility | Payer: Medicare HMO | Attending: Internal Medicine | Admitting: Internal Medicine

## 2017-12-17 DIAGNOSIS — A419 Sepsis, unspecified organism: Secondary | ICD-10-CM | POA: Diagnosis not present

## 2017-12-17 DIAGNOSIS — N201 Calculus of ureter: Secondary | ICD-10-CM | POA: Diagnosis not present

## 2017-12-17 DIAGNOSIS — Z87442 Personal history of urinary calculi: Secondary | ICD-10-CM | POA: Diagnosis not present

## 2017-12-17 DIAGNOSIS — I447 Left bundle-branch block, unspecified: Secondary | ICD-10-CM | POA: Diagnosis not present

## 2017-12-17 DIAGNOSIS — E7800 Pure hypercholesterolemia, unspecified: Secondary | ICD-10-CM | POA: Diagnosis present

## 2017-12-17 DIAGNOSIS — R112 Nausea with vomiting, unspecified: Secondary | ICD-10-CM | POA: Diagnosis present

## 2017-12-17 DIAGNOSIS — R319 Hematuria, unspecified: Secondary | ICD-10-CM

## 2017-12-17 DIAGNOSIS — Z7901 Long term (current) use of anticoagulants: Secondary | ICD-10-CM

## 2017-12-17 DIAGNOSIS — E876 Hypokalemia: Secondary | ICD-10-CM | POA: Diagnosis present

## 2017-12-17 DIAGNOSIS — K219 Gastro-esophageal reflux disease without esophagitis: Secondary | ICD-10-CM | POA: Diagnosis present

## 2017-12-17 DIAGNOSIS — Z66 Do not resuscitate: Secondary | ICD-10-CM | POA: Diagnosis present

## 2017-12-17 DIAGNOSIS — G4700 Insomnia, unspecified: Secondary | ICD-10-CM | POA: Diagnosis present

## 2017-12-17 DIAGNOSIS — Y95 Nosocomial condition: Secondary | ICD-10-CM | POA: Diagnosis present

## 2017-12-17 DIAGNOSIS — M6281 Muscle weakness (generalized): Secondary | ICD-10-CM | POA: Diagnosis not present

## 2017-12-17 DIAGNOSIS — A4151 Sepsis due to Escherichia coli [E. coli]: Secondary | ICD-10-CM | POA: Diagnosis present

## 2017-12-17 DIAGNOSIS — N390 Urinary tract infection, site not specified: Secondary | ICD-10-CM

## 2017-12-17 DIAGNOSIS — J181 Lobar pneumonia, unspecified organism: Secondary | ICD-10-CM | POA: Diagnosis not present

## 2017-12-17 DIAGNOSIS — R6520 Severe sepsis without septic shock: Secondary | ICD-10-CM | POA: Diagnosis present

## 2017-12-17 DIAGNOSIS — J9601 Acute respiratory failure with hypoxia: Secondary | ICD-10-CM | POA: Diagnosis present

## 2017-12-17 DIAGNOSIS — I252 Old myocardial infarction: Secondary | ICD-10-CM

## 2017-12-17 DIAGNOSIS — N200 Calculus of kidney: Secondary | ICD-10-CM | POA: Diagnosis not present

## 2017-12-17 DIAGNOSIS — I509 Heart failure, unspecified: Secondary | ICD-10-CM | POA: Diagnosis present

## 2017-12-17 DIAGNOSIS — Z1612 Extended spectrum beta lactamase (ESBL) resistance: Secondary | ICD-10-CM | POA: Diagnosis present

## 2017-12-17 DIAGNOSIS — N179 Acute kidney failure, unspecified: Secondary | ICD-10-CM | POA: Diagnosis present

## 2017-12-17 DIAGNOSIS — R001 Bradycardia, unspecified: Secondary | ICD-10-CM | POA: Diagnosis not present

## 2017-12-17 DIAGNOSIS — Z9049 Acquired absence of other specified parts of digestive tract: Secondary | ICD-10-CM

## 2017-12-17 DIAGNOSIS — N132 Hydronephrosis with renal and ureteral calculous obstruction: Secondary | ICD-10-CM | POA: Diagnosis not present

## 2017-12-17 DIAGNOSIS — R0600 Dyspnea, unspecified: Secondary | ICD-10-CM

## 2017-12-17 DIAGNOSIS — Z7401 Bed confinement status: Secondary | ICD-10-CM | POA: Diagnosis not present

## 2017-12-17 DIAGNOSIS — Z79899 Other long term (current) drug therapy: Secondary | ICD-10-CM

## 2017-12-17 DIAGNOSIS — R0902 Hypoxemia: Secondary | ICD-10-CM | POA: Diagnosis not present

## 2017-12-17 DIAGNOSIS — K5790 Diverticulosis of intestine, part unspecified, without perforation or abscess without bleeding: Secondary | ICD-10-CM | POA: Diagnosis present

## 2017-12-17 DIAGNOSIS — Z885 Allergy status to narcotic agent status: Secondary | ICD-10-CM

## 2017-12-17 DIAGNOSIS — E1122 Type 2 diabetes mellitus with diabetic chronic kidney disease: Secondary | ICD-10-CM | POA: Diagnosis present

## 2017-12-17 DIAGNOSIS — R110 Nausea: Secondary | ICD-10-CM | POA: Diagnosis not present

## 2017-12-17 DIAGNOSIS — I2510 Atherosclerotic heart disease of native coronary artery without angina pectoris: Secondary | ICD-10-CM | POA: Diagnosis present

## 2017-12-17 DIAGNOSIS — Z8249 Family history of ischemic heart disease and other diseases of the circulatory system: Secondary | ICD-10-CM

## 2017-12-17 DIAGNOSIS — I130 Hypertensive heart and chronic kidney disease with heart failure and stage 1 through stage 4 chronic kidney disease, or unspecified chronic kidney disease: Secondary | ICD-10-CM | POA: Diagnosis present

## 2017-12-17 DIAGNOSIS — N136 Pyonephrosis: Secondary | ICD-10-CM | POA: Diagnosis present

## 2017-12-17 DIAGNOSIS — Z87891 Personal history of nicotine dependence: Secondary | ICD-10-CM

## 2017-12-17 DIAGNOSIS — I340 Nonrheumatic mitral (valve) insufficiency: Secondary | ICD-10-CM | POA: Diagnosis present

## 2017-12-17 DIAGNOSIS — J189 Pneumonia, unspecified organism: Secondary | ICD-10-CM | POA: Diagnosis present

## 2017-12-17 DIAGNOSIS — Z7984 Long term (current) use of oral hypoglycemic drugs: Secondary | ICD-10-CM

## 2017-12-17 DIAGNOSIS — M5136 Other intervertebral disc degeneration, lumbar region: Secondary | ICD-10-CM | POA: Diagnosis present

## 2017-12-17 DIAGNOSIS — Z87440 Personal history of urinary (tract) infections: Secondary | ICD-10-CM

## 2017-12-17 DIAGNOSIS — N183 Chronic kidney disease, stage 3 (moderate): Secondary | ICD-10-CM | POA: Diagnosis present

## 2017-12-17 DIAGNOSIS — Z960 Presence of urogenital implants: Secondary | ICD-10-CM | POA: Diagnosis present

## 2017-12-17 DIAGNOSIS — E1165 Type 2 diabetes mellitus with hyperglycemia: Secondary | ICD-10-CM | POA: Diagnosis not present

## 2017-12-17 DIAGNOSIS — I429 Cardiomyopathy, unspecified: Secondary | ICD-10-CM | POA: Diagnosis present

## 2017-12-17 DIAGNOSIS — M2550 Pain in unspecified joint: Secondary | ICD-10-CM | POA: Diagnosis not present

## 2017-12-17 DIAGNOSIS — I959 Hypotension, unspecified: Secondary | ICD-10-CM | POA: Diagnosis not present

## 2017-12-17 DIAGNOSIS — Z90710 Acquired absence of both cervix and uterus: Secondary | ICD-10-CM

## 2017-12-17 HISTORY — DX: Personal history of urinary calculi: Z87.442

## 2017-12-17 HISTORY — DX: Pneumonia, unspecified organism: J18.9

## 2017-12-17 HISTORY — DX: Heart failure, unspecified: I50.9

## 2017-12-17 LAB — COMPREHENSIVE METABOLIC PANEL
ALBUMIN: 2.9 g/dL — AB (ref 3.5–5.0)
ALK PHOS: 88 U/L (ref 38–126)
ALT: 13 U/L (ref 0–44)
ANION GAP: 11 (ref 5–15)
AST: 25 U/L (ref 15–41)
BUN: 12 mg/dL (ref 8–23)
CO2: 25 mmol/L (ref 22–32)
Calcium: 9.2 mg/dL (ref 8.9–10.3)
Chloride: 97 mmol/L — ABNORMAL LOW (ref 98–111)
Creatinine, Ser: 1.31 mg/dL — ABNORMAL HIGH (ref 0.44–1.00)
GFR calc Af Amer: 48 mL/min — ABNORMAL LOW (ref >60)
GFR calc non Af Amer: 41 mL/min — ABNORMAL LOW (ref >60)
GLUCOSE: 295 mg/dL — AB (ref 70–99)
Potassium: 2.3 mmol/L — CL (ref 3.5–5.1)
SODIUM: 133 mmol/L — AB (ref 135–145)
Total Bilirubin: 2.4 mg/dL — ABNORMAL HIGH (ref 0.3–1.2)
Total Protein: 6.6 g/dL (ref 6.5–8.1)

## 2017-12-17 LAB — CBC WITH DIFFERENTIAL/PLATELET
ABS IMMATURE GRANULOCYTES: 0.05 10*3/uL (ref 0.00–0.07)
BASOS ABS: 0 10*3/uL (ref 0.0–0.1)
Basophils Relative: 0 %
Eosinophils Absolute: 0.2 10*3/uL (ref 0.0–0.5)
Eosinophils Relative: 3 %
HEMATOCRIT: 30.2 % — AB (ref 36.0–46.0)
HEMOGLOBIN: 9.7 g/dL — AB (ref 12.0–15.0)
IMMATURE GRANULOCYTES: 1 %
LYMPHS ABS: 0.4 10*3/uL — AB (ref 0.7–4.0)
LYMPHS PCT: 6 %
MCH: 27.1 pg (ref 26.0–34.0)
MCHC: 32.1 g/dL (ref 30.0–36.0)
MCV: 84.4 fL (ref 80.0–100.0)
Monocytes Absolute: 0.1 10*3/uL (ref 0.1–1.0)
Monocytes Relative: 2 %
NEUTROS PCT: 88 %
NRBC: 0 % (ref 0.0–0.2)
Neutro Abs: 5.3 10*3/uL (ref 1.7–7.7)
Platelets: 128 10*3/uL — ABNORMAL LOW (ref 150–400)
RBC: 3.58 MIL/uL — ABNORMAL LOW (ref 3.87–5.11)
RDW: 15.3 % (ref 11.5–15.5)
WBC: 6 10*3/uL (ref 4.0–10.5)

## 2017-12-17 LAB — LACTIC ACID, PLASMA: Lactic Acid, Venous: 2.1 mmol/L (ref 0.5–1.9)

## 2017-12-17 LAB — URINALYSIS, COMPLETE (UACMP) WITH MICROSCOPIC
Bilirubin Urine: NEGATIVE
GLUCOSE, UA: NEGATIVE mg/dL
Ketones, ur: NEGATIVE mg/dL
Nitrite: NEGATIVE
PH: 6 (ref 5.0–8.0)
Protein, ur: 100 mg/dL — AB
RBC / HPF: >50 RBC/hpf — ABNORMAL HIGH (ref 0–5)
Specific Gravity, Urine: 1.006 (ref 1.005–1.030)

## 2017-12-17 LAB — CG4 I-STAT (LACTIC ACID): Lactic Acid, Venous: 2.95 mmol/L (ref 0.5–1.9)

## 2017-12-17 LAB — INFLUENZA PANEL BY PCR (TYPE A & B)
Influenza A By PCR: NEGATIVE
Influenza B By PCR: NEGATIVE

## 2017-12-17 LAB — LIPASE, BLOOD: Lipase: 24 U/L (ref 11–51)

## 2017-12-17 MED ORDER — SODIUM CHLORIDE 0.9 % IV BOLUS
1000.0000 mL | Freq: Once | INTRAVENOUS | Status: AC
  Administered 2017-12-17: 1000 mL via INTRAVENOUS

## 2017-12-17 MED ORDER — SODIUM CHLORIDE 0.9 % IV SOLN
500.0000 mg | Freq: Once | INTRAVENOUS | Status: AC
  Administered 2017-12-17: 500 mg via INTRAVENOUS
  Filled 2017-12-17: qty 500

## 2017-12-17 MED ORDER — SODIUM CHLORIDE 0.9 % IV SOLN
2.0000 g | Freq: Once | INTRAVENOUS | Status: AC
  Administered 2017-12-17: 2 g via INTRAVENOUS
  Filled 2017-12-17: qty 20

## 2017-12-17 NOTE — ED Notes (Signed)
Pt stated that she wanted something to drink and eat. Asked provider he stated pt could have both. Pt was given cup of water and saltine crackers.

## 2017-12-17 NOTE — ED Notes (Signed)
[phone]-Kristina Archer(SON)

## 2017-12-17 NOTE — ED Provider Notes (Addendum)
[facility] Emergency Department Provider Note  ____________________________________________  Time seen: Approximately 11:24 PM  I have reviewed the triage vital signs and the nursing notes.   HISTORY  Chief Complaint Vomiting and Diarrhea  Level 5 Caveat: Portions of the History and Physical including HPI and review of systems are unable to be completely obtained due to patient being a poor historian    HPI Kristina Archer is a 72 y.o. female with a history of cardiomyopathy, hypertension, diabetes, CHF who comes to the ED complaining of nausea vomiting diarrhea that started today.  She also complains of back pain that she relates to a kidney stone.  EMS report when they found the patient her couch was saturated with liquid stool.  Patient also complains of shortness of breath and cough for the past few days.      Past Medical History:  Diagnosis Date  . Anxiety   . Cardiomyopathy (HCC)    a. 10/2017 Echo: EF 20-25%, diff HK, mild MR. Nl RV size.  . DDD (degenerative disc disease), lumbar   . Diverticulosis   . GERD (gastroesophageal reflux disease)   . High blood pressure   . High cholesterol   . LBBB (left bundle branch block)   . Nephrolithiasis   . Sciatica   . Type II diabetes mellitus [facility](HCC)      Patient Active Problem List   Diagnosis Date Noted  . CHF (congestive heart failure) (HCC)   . Right ureteral stone 11/02/2017     Past Surgical History:  Procedure Laterality Date  . ABDOMINAL HYSTERECTOMY    . CHOLECYSTECTOMY    . CYSTOSCOPY W/ RETROGRADES Bilateral 05/24/2017   Procedure: CYSTOSCOPY WITH RETROGRADE PYELOGRAM;  Surgeon: [name], [name], MD;  Location: [facility] ORS;  Service: Urology;  Laterality: Bilateral;  . CYSTOSCOPY W/ URETERAL STENT PLACEMENT Right 11/02/2017   Procedure: CYSTOSCOPY WITH RETROGRADE PYELOGRAM/URETERAL STENT PLACEMENT;  Surgeon: [name], [name], MD;  Location: [facility] ORS;  Service: Urology;  Laterality:  Right;  . CYSTOSCOPY/URETEROSCOPY/HOLMIUM LASER/STENT PLACEMENT Left 05/24/2017   Procedure: CYSTOSCOPY/URETEROSCOPY/HOLMIUM LASER/STENT PLACEMENT;  Surgeon: [name], [name], MD;  Location: [facility] ORS;  Service: Urology;  Laterality: Left;     Prior to Admission medications   Medication Sig Start Date End Date Taking? Authorizing Provider  albuterol (PROVENTIL HFA;VENTOLIN HFA) 108 (90 Base) MCG/ACT inhaler Inhale 2 puffs into the lungs every 6 (six) hours as needed for wheezing or shortness of breath. 11/13/17  Yes Katha HammingKonidena, Snehalatha, MD  amiodarone (PACERONE) 200 MG tablet Take 1 tablet (200 mg total) by mouth 2 (two) times daily. 11/13/17  Yes Katha HammingKonidena, Snehalatha, MD  apixaban (ELIQUIS) 5 MG TABS tablet Take 1 tablet (5 mg total) by mouth 2 (two) times daily. 11/13/17  Yes Katha HammingKonidena, Snehalatha, MD  carvedilol (COREG) 3.125 MG tablet Take 1 tablet (3.125 mg total) by mouth 2 (two) times daily with a meal. 11/13/17  Yes Katha HammingKonidena, Snehalatha, MD  docusate sodium (COLACE) 100 MG capsule Take 1 capsule (100 mg total) by mouth 2 (two) times daily. 05/24/17  Yes [name], [name], MD  furosemide (LASIX) 20 MG tablet Take 20 mg by mouth daily.   Yes [provider]  lisinopril (PRINIVIL,ZESTRIL) 2.5 MG tablet Take 1 tablet (2.5 mg total) by mouth daily. 11/13/17  Yes Katha HammingKonidena, Snehalatha, MD  metFORMIN (GLUCOPHAGE-XR) 500 MG 24 hr tablet Take 500 mg by mouth 2 (two) times daily.   Yes [provider]  Multiple Vitamin (MULTIVITAMIN WITH MINERALS) TABS tablet Take 1 tablet  by mouth daily.   Yes [provider]  PARoxetine (PAXIL) 20 MG tablet Take 20 mg by mouth at bedtime.   Yes [provider]  rosuvastatin (CRESTOR) 40 MG tablet Take 1 tablet (40 mg total) by mouth daily. 11/13/17  Yes Katha Hamming, MD  traZODone (DESYREL) 50 MG tablet Take 1 tablet by mouth at bedtime. 11/30/17  Yes [provider]  dexlansoprazole (DEXILANT) 60 MG capsule Take 60 mg by  mouth daily before breakfast.    [provider]  gabapentin (NEURONTIN) 100 MG capsule Take 100 mg by mouth 3 (three) times daily as needed (back pain).    [provider]  glimepiride (AMARYL) 2 MG tablet Take 2 mg by mouth daily with breakfast.    [provider]  oxybutynin (DITROPAN) 5 MG tablet Take 1 tablet (5 mg total) by mouth every 8 (eight) hours as needed for bladder spasms. Patient not taking: Reported on 11/02/2017 05/24/17   Vanna Scotland, MD  sitaGLIPtin (JANUVIA) 100 MG tablet Take 100 mg by mouth daily.    [provider]  tamsulosin (FLOMAX) 0.4 MG CAPS capsule Take 1 capsule (0.4 mg total) by mouth daily. Patient not taking: Reported on 11/02/2017 05/24/17   Vanna Scotland, MD     Allergies Codeine   Family History  Problem Relation Age of Onset  . Heart attack Father 71  . Bladder Cancer Neg Hx   . Kidney cancer Neg Hx     Social History Social History   Tobacco Use  . Smoking status: Former Smoker    Packs/day: 1.50    Years: 15.00    Pack years: 22.50    Types: Cigarettes    Last attempt to quit: 05/12/1997    Years since quitting: 20.6  . Smokeless tobacco: Never Used  Substance Use Topics  . Alcohol use: Not Currently  . Drug use: Never    Review of Systems  Constitutional:   No fever or chills.  ENT:   No sore throat. No rhinorrhea. Cardiovascular:   No chest pain or syncope. Respiratory:   Positive shortness of breath and cough cough. Gastrointestinal: Positive as above for abdominal pain and vomiting and diarrhea.  Musculoskeletal:   Negative for focal pain or swelling All other systems reviewed and are negative except as documented above in ROS and HPI.  ____________________________________________   PHYSICAL EXAM:  VITAL SIGNS: ED Triage Vitals  Enc Vitals Group     BP 12/17/17 2053 (!) 129/55     Pulse Rate 12/17/17 2053 86     Resp 12/17/17 2053 17     Temp 12/17/17 2053 97.9 F (36.6 C)      Temp Source 12/17/17 2053 Oral     SpO2 12/17/17 2053 91 %     Weight 12/17/17 2059 200 lb (90.7 kg)     Height 12/17/17 2059 5\' 3"  (1.6 m)     Head Circumference --      Peak Flow --      Pain Score 12/17/17 2059 6     Pain Loc --      Pain Edu? --      Excl. in GC? --     Vital signs reviewed, nursing assessments reviewed.   Constitutional:   Alert and oriented.  Ill-appearing. Eyes:   Conjunctivae are normal. EOMI. PERRL. ENT      Head:   Normocephalic and atraumatic.      Nose:   No congestion/rhinnorhea.  Mouth/Throat:   Dry mucous membranes, no pharyngeal erythema. No peritonsillar mass.       Neck:   No meningismus. Full ROM. Hematological/Lymphatic/Immunilogical:   No cervical lymphadenopathy. Cardiovascular:   RRR. Symmetric bilateral radial and DP pulses.  No murmurs. Cap refill less than 2 seconds. Respiratory:   Room air oxygen saturation 87%.  Increases to 94% on 2 L nasal cannula.  Normal respiratory effort without tachypnea/retractions. Breath sounds are clear and equal bilaterally. No wheezes/rales/rhonchi. Gastrointestinal:   Soft and nontender. Non distended. There is no CVA tenderness.  No rebound, rigidity, or guarding.  Musculoskeletal:   Normal range of motion in all extremities. No joint effusions.  No lower extremity tenderness.  No edema. Neurologic:   Normal speech and language.  Motor grossly intact. No acute focal neurologic deficits are appreciated.  Skin:    Skin is warm, dry and intact. No rash noted.  No petechiae, purpura, or bullae.  ____________________________________________    LABS (pertinent positives/negatives) (all labs ordered are listed, but only abnormal results are displayed) Labs Reviewed  COMPREHENSIVE METABOLIC PANEL - Abnormal; Notable for the following components:      Result Value   Sodium 133 (*)    Potassium 2.3 (*)    Chloride 97 (*)    Glucose, Bld 295 (*)    Creatinine, Ser 1.31 (*)    Albumin 2.9 (*)     Total Bilirubin 2.4 (*)    GFR calc non Af Amer 41 (*)    GFR calc Af Amer 48 (*)    All other components within normal limits  CBC WITH DIFFERENTIAL/PLATELET - Abnormal; Notable for the following components:   RBC 3.58 (*)    Hemoglobin 9.7 (*)    HCT 30.2 (*)    Platelets 128 (*)    Lymphs Abs 0.4 (*)    All other components within normal limits  LACTIC ACID, PLASMA - Abnormal; Notable for the following components:   Lactic Acid, Venous 2.1 (*)    All other components within normal limits  URINALYSIS, COMPLETE (UACMP) WITH MICROSCOPIC - Abnormal; Notable for the following components:   Color, Urine YELLOW (*)    APPearance CLOUDY (*)    Hgb urine dipstick LARGE (*)    Protein, ur 100 (*)    Leukocytes, UA LARGE (*)    RBC / HPF >50 (*)    WBC, UA >50 (*)    Bacteria, UA MANY (*)    All other components within normal limits  CG4 I-STAT (LACTIC ACID) - Abnormal; Notable for the following components:   Lactic Acid, Venous 2.95 (*)    All other components within normal limits  CULTURE, BLOOD (ROUTINE X 2)  CULTURE, BLOOD (ROUTINE X 2)  C DIFFICILE QUICK SCREEN W PCR REFLEX  URINE CULTURE  LIPASE, BLOOD  INFLUENZA PANEL BY PCR (TYPE A & B)  LACTIC ACID, PLASMA  I-STAT CG4 LACTIC ACID, ED   ____________________________________________   EKG    ____________________________________________    RADIOLOGY  Dg Chest Portable 1 View  Result Date: 12/17/2017 CLINICAL DATA:  Hypoxia, vomiting, weakness. EXAM: PORTABLE CHEST 1 VIEW COMPARISON:  11/07/2017. FINDINGS: Enlarged cardiac silhouette with an interval decrease in size. Interval mild patchy opacity at the left lung base and possible small left pleural effusion. Clear right lung. Normal vascularity. Diffuse osteopenia. IMPRESSION: 1. Interval mild patchy atelectasis or pneumonia at the left lung base and possible small left pleural effusion. 2. Improved cardiomegaly. Electronically Signed   By: Viviann Spare  Azucena Kuba M.D.   On:  12/17/2017 21:38   Ct Renal Stone Study  Result Date: 12/17/2017 CLINICAL DATA:  Nausea, vomiting and diarrhea that began today. Back pain. Known ureteral calculus on the right with a stent. EXAM: CT ABDOMEN AND PELVIS WITHOUT CONTRAST TECHNIQUE: Multidetector CT imaging of the abdomen and pelvis was performed following the standard protocol without IV contrast. COMPARISON:  Abdomen pelvis radiographs dated 11/03/2017 and abdomen and pelvis CT dated 11/02/2017. FINDINGS: Lower chest: Interval moderate dependent patchy opacity in the left lower lobe and mild dependent patchy opacity in the right lower lobe. Interval mild enlargement of the heart with left atrial and left ventricular enlargement. No pleural fluid seen. Hepatobiliary: No focal liver abnormality is seen. Status post cholecystectomy. No biliary dilatation. Pancreas: Unremarkable. No pancreatic ductal dilatation or surrounding inflammatory changes. Spleen: Enlarged, measuring 14.9 cm in length. Adrenals/Urinary Tract: 2 adjacent calculi in the lower pole of the right kidney, 1 measuring 6 mm in the other measuring 8 mm. 7 mm calculus in the proximal to mid right ureter at the level of the L4 vertebral body. A right renal stent remains partially and coiled in the right renal collecting system. Moderate dilatation of the right renal collecting system and proximal ureter. 5 mm lower pole left renal calculus. Otherwise, normal appearing left kidney and left ureter without hydronephrosis. Bilateral pelvic phleboliths. Normal appearing adrenal glands. Stomach/Bowel: Multiple colonic diverticula without evidence of diverticulitis. Normal appearing appendix, small bowel and stomach. Vascular/Lymphatic: Atheromatous arterial calcifications without aneurysm. No enlarged lymph nodes. Reproductive: Status post hysterectomy. No adnexal masses. The previously demonstrated right adnexal cyst is no longer seen. Other: No abdominal wall hernia or abnormality. No  abdominopelvic ascites. Musculoskeletal: Lumbar and lower thoracic spine degenerative changes. IMPRESSION: 1. Moderate right hydronephrosis and proximal hydroureter. 2. 7 mm proximal to mid right ureteral calculus at the level of the L4 vertebral body. 3. Bilateral nonobstructing renal calculi. 4. Interval moderate dependent atelectasis and possible pneumonia in the left lower lobe and mild dependent atelectasis and possible pneumonia in the right lower lobe. 5. Mild cardiomegaly with left atrial and left ventricular enlargement. 6. Stable mild splenomegaly. 7. Colonic diverticulosis. Electronically Signed   By: Beckie Salts M.D.   On: 12/17/2017 22:57    ____________________________________________   PROCEDURES .Critical Care Performed by: Sharman Cheek, MD Authorized by: Sharman Cheek, MD   Critical care provider statement:    Critical care time (minutes):  35   Critical care time was exclusive of:  Separately billable procedures and treating other patients   Critical care was necessary to treat or prevent imminent or life-threatening deterioration of the following conditions:  Sepsis and respiratory failure   Critical care was time spent personally by me on the following activities:  Development of treatment plan with patient or surrogate, discussions with consultants, evaluation of patient's response to treatment, examination of patient, obtaining history from patient or surrogate, ordering and performing treatments and interventions, ordering and review of laboratory studies, ordering and review of radiographic studies, pulse oximetry, re-evaluation of patient's condition and review of old charts    ____________________________________________  DIFFERENTIAL DIAGNOSIS   Influenza, gastroenteritis, biliary disease, pancreatitis, pneumonia, pneumothorax  CLINICAL IMPRESSION / ASSESSMENT AND PLAN / ED COURSE  Pertinent labs & imaging results that were available during my care of  the patient were reviewed by me and considered in my medical decision making (see chart for details).    Patient presents with nausea vomiting diarrhea.  On initial assessment also found  to have a room air oxygenation of 87%.  Lung exam is unremarkable.  Vital signs are unremarkable, afebrile, normal blood pressure, not meeting sepsis criteria on initial assessment.  pursued a sepsis work-up with lactate blood culture chest x-ray urinalysis and labs.    Clinical Course as of Dec 19 18  Wynelle Link Dec 17, 2017  2153 Chest x-ray suggest pneumonia.  With her acute hypoxic respiratory failure, start antibiotics for community-acquired pneumonia.  Lactate is elevated to 3 concerning for sepsis.  No signs of shock at present time.   [PS]    Clinical Course User Index [PS] Sharman Cheek, MD    ----------------------------------------- 11:28 PM on 12/17/2017 -----------------------------------------  With lactate elevation, added on a CT scan of the abdomen and pelvis given the prominent GI symptoms.  This does show a 7 mm ureteral stone on the right causing proximal hydronephrosis.  The CT also demonstrates pneumonia at bilateral lung bases as suggested by the chest x-ray.  Urinalysis still pending.  At present appears the patient has sepsis due to community-acquired pneumonia.  Azithromycin and ceftriaxone ordered.  IV fluids for hydration and resuscitation.  No signs of shock, blood pressures remained stable.  Plan to admit  ----------------------------------------- 12:19 AM on 12/18/2017 -----------------------------------------  Lactate improved to 2.1 after fluids.  Case discussed with the hospitalist.  Urinalysis is concerning for infection with red cells white cells bacteria and leukocytes.  Discussed with urology who will evaluate in the morning.  Recommends keeping patient n.p.o.  May have to delay surgical management of the ureteral obstruction due to the pneumonia until respiratory status  is more stabilized.   ____________________________________________   FINAL CLINICAL IMPRESSION(S) / ED DIAGNOSES    Final diagnoses:  Sepsis with acute hypoxic respiratory failure without septic shock, due to unspecified organism (HCC)  Ureterolithiasis  Urinary tract infection with hematuria, site unspecified  Healthcare-associated pneumonia     ED Discharge Orders    None      Portions of this note were generated with dragon dictation software. Dictation errors may occur despite best attempts at proofreading.    Sharman Cheek, MD 12/17/17 2330    Sharman Cheek, MD 12/18/17 Perlie Mayo

## 2017-12-17 NOTE — ED Triage Notes (Signed)
Pt arrives via EMS with complaints of n/v/d that started today.Pt stated that she has a back pain and she has a kidney stone.

## 2017-12-18 ENCOUNTER — Other Ambulatory Visit: Payer: Self-pay

## 2017-12-18 DIAGNOSIS — I2510 Atherosclerotic heart disease of native coronary artery without angina pectoris: Secondary | ICD-10-CM | POA: Diagnosis present

## 2017-12-18 DIAGNOSIS — I509 Heart failure, unspecified: Secondary | ICD-10-CM | POA: Diagnosis present

## 2017-12-18 DIAGNOSIS — E876 Hypokalemia: Secondary | ICD-10-CM | POA: Diagnosis present

## 2017-12-18 DIAGNOSIS — N183 Chronic kidney disease, stage 3 (moderate): Secondary | ICD-10-CM | POA: Diagnosis present

## 2017-12-18 DIAGNOSIS — M5136 Other intervertebral disc degeneration, lumbar region: Secondary | ICD-10-CM | POA: Diagnosis present

## 2017-12-18 DIAGNOSIS — J9601 Acute respiratory failure with hypoxia: Secondary | ICD-10-CM | POA: Diagnosis present

## 2017-12-18 DIAGNOSIS — Z66 Do not resuscitate: Secondary | ICD-10-CM | POA: Diagnosis present

## 2017-12-18 DIAGNOSIS — I130 Hypertensive heart and chronic kidney disease with heart failure and stage 1 through stage 4 chronic kidney disease, or unspecified chronic kidney disease: Secondary | ICD-10-CM | POA: Diagnosis present

## 2017-12-18 DIAGNOSIS — R319 Hematuria, unspecified: Secondary | ICD-10-CM

## 2017-12-18 DIAGNOSIS — G4700 Insomnia, unspecified: Secondary | ICD-10-CM | POA: Diagnosis present

## 2017-12-18 DIAGNOSIS — N136 Pyonephrosis: Secondary | ICD-10-CM | POA: Diagnosis present

## 2017-12-18 DIAGNOSIS — I429 Cardiomyopathy, unspecified: Secondary | ICD-10-CM | POA: Diagnosis present

## 2017-12-18 DIAGNOSIS — A419 Sepsis, unspecified organism: Secondary | ICD-10-CM | POA: Diagnosis present

## 2017-12-18 DIAGNOSIS — N390 Urinary tract infection, site not specified: Secondary | ICD-10-CM

## 2017-12-18 DIAGNOSIS — I252 Old myocardial infarction: Secondary | ICD-10-CM | POA: Diagnosis not present

## 2017-12-18 DIAGNOSIS — E1122 Type 2 diabetes mellitus with diabetic chronic kidney disease: Secondary | ICD-10-CM | POA: Diagnosis present

## 2017-12-18 DIAGNOSIS — Z960 Presence of urogenital implants: Secondary | ICD-10-CM | POA: Diagnosis present

## 2017-12-18 DIAGNOSIS — A4151 Sepsis due to Escherichia coli [E. coli]: Secondary | ICD-10-CM | POA: Diagnosis present

## 2017-12-18 DIAGNOSIS — Y95 Nosocomial condition: Secondary | ICD-10-CM | POA: Diagnosis present

## 2017-12-18 DIAGNOSIS — E7800 Pure hypercholesterolemia, unspecified: Secondary | ICD-10-CM | POA: Diagnosis present

## 2017-12-18 DIAGNOSIS — Z87442 Personal history of urinary calculi: Secondary | ICD-10-CM | POA: Diagnosis not present

## 2017-12-18 DIAGNOSIS — J189 Pneumonia, unspecified organism: Secondary | ICD-10-CM | POA: Diagnosis present

## 2017-12-18 DIAGNOSIS — K219 Gastro-esophageal reflux disease without esophagitis: Secondary | ICD-10-CM | POA: Diagnosis present

## 2017-12-18 DIAGNOSIS — R112 Nausea with vomiting, unspecified: Secondary | ICD-10-CM | POA: Diagnosis present

## 2017-12-18 DIAGNOSIS — R6520 Severe sepsis without septic shock: Secondary | ICD-10-CM | POA: Diagnosis present

## 2017-12-18 DIAGNOSIS — I340 Nonrheumatic mitral (valve) insufficiency: Secondary | ICD-10-CM | POA: Diagnosis present

## 2017-12-18 DIAGNOSIS — Z1612 Extended spectrum beta lactamase (ESBL) resistance: Secondary | ICD-10-CM | POA: Diagnosis present

## 2017-12-18 DIAGNOSIS — N179 Acute kidney failure, unspecified: Secondary | ICD-10-CM | POA: Diagnosis present

## 2017-12-18 DIAGNOSIS — K5790 Diverticulosis of intestine, part unspecified, without perforation or abscess without bleeding: Secondary | ICD-10-CM | POA: Diagnosis present

## 2017-12-18 LAB — CBC
HCT: 24.1 % — ABNORMAL LOW (ref 36.0–46.0)
Hemoglobin: 7.8 g/dL — ABNORMAL LOW (ref 12.0–15.0)
MCH: 27.2 pg (ref 26.0–34.0)
MCHC: 32.4 g/dL (ref 30.0–36.0)
MCV: 84 fL (ref 80.0–100.0)
Platelets: 136 10*3/uL — ABNORMAL LOW (ref 150–400)
RBC: 2.87 MIL/uL — ABNORMAL LOW (ref 3.87–5.11)
RDW: 15.6 % — ABNORMAL HIGH (ref 11.5–15.5)
WBC: 11.6 10*3/uL — ABNORMAL HIGH (ref 4.0–10.5)
nRBC: 0 % (ref 0.0–0.2)

## 2017-12-18 LAB — LACTIC ACID, PLASMA: Lactic Acid, Venous: 1.8 mmol/L (ref 0.5–1.9)

## 2017-12-18 LAB — GLUCOSE, CAPILLARY
Glucose-Capillary: 381 mg/dL — ABNORMAL HIGH (ref 70–99)
Glucose-Capillary: 314 mg/dL — ABNORMAL HIGH (ref 70–99)
Glucose-Capillary: 195 mg/dL — ABNORMAL HIGH (ref 70–99)
Glucose-Capillary: 175 mg/dL — ABNORMAL HIGH (ref 70–99)

## 2017-12-18 LAB — BASIC METABOLIC PANEL
Anion gap: 8 (ref 5–15)
BUN: 13 mg/dL (ref 8–23)
CALCIUM: 8.1 mg/dL — AB (ref 8.9–10.3)
CO2: 24 mmol/L (ref 22–32)
Chloride: 100 mmol/L (ref 98–111)
Creatinine, Ser: 1.48 mg/dL — ABNORMAL HIGH (ref 0.44–1.00)
GFR calc Af Amer: 41 mL/min — ABNORMAL LOW (ref >60)
GFR calc non Af Amer: 36 mL/min — ABNORMAL LOW (ref >60)
Glucose, Bld: 378 mg/dL — ABNORMAL HIGH (ref 70–99)
Potassium: 2.5 mmol/L — CL (ref 3.5–5.1)
Sodium: 132 mmol/L — ABNORMAL LOW (ref 135–145)

## 2017-12-18 LAB — MRSA PCR SCREENING: MRSA BY PCR: NEGATIVE

## 2017-12-18 LAB — POTASSIUM: Potassium: 3.5 mmol/L (ref 3.5–5.1)

## 2017-12-18 LAB — VANCOMYCIN, RANDOM: Vancomycin Rm: 10

## 2017-12-18 LAB — MAGNESIUM: Magnesium: 1.7 mg/dL (ref 1.7–2.4)

## 2017-12-18 MED ORDER — PANTOPRAZOLE SODIUM 40 MG PO TBEC
40.0000 mg | DELAYED_RELEASE_TABLET | Freq: Every day | ORAL | Status: DC
  Administered 2017-12-18 – 2017-12-22 (×5): 40 mg via ORAL
  Filled 2017-12-18 (×5): qty 1

## 2017-12-18 MED ORDER — HYDROCODONE-ACETAMINOPHEN 5-325 MG PO TABS
1.0000 | ORAL_TABLET | ORAL | Status: DC | PRN
  Administered 2017-12-18: 1 via ORAL
  Filled 2017-12-18: qty 1

## 2017-12-18 MED ORDER — TRAZODONE HCL 50 MG PO TABS
25.0000 mg | ORAL_TABLET | Freq: Every evening | ORAL | Status: DC | PRN
  Administered 2017-12-18: 25 mg via ORAL
  Filled 2017-12-18: qty 1

## 2017-12-18 MED ORDER — POTASSIUM CHLORIDE CRYS ER 20 MEQ PO TBCR
40.0000 meq | EXTENDED_RELEASE_TABLET | Freq: Two times a day (BID) | ORAL | Status: DC
  Filled 2017-12-18: qty 2

## 2017-12-18 MED ORDER — SODIUM CHLORIDE 0.9 % IV SOLN
2.0000 g | Freq: Once | INTRAVENOUS | Status: AC
  Administered 2017-12-18: 2 g via INTRAVENOUS
  Filled 2017-12-18: qty 2

## 2017-12-18 MED ORDER — ONDANSETRON HCL 4 MG PO TABS: 4.0000 mg | ORAL_TABLET | Freq: Four times a day (QID) | ORAL | Status: DC | PRN

## 2017-12-18 MED ORDER — ALBUTEROL SULFATE (2.5 MG/3ML) 0.083% IN NEBU
2.5000 mg | INHALATION_SOLUTION | Freq: Four times a day (QID) | RESPIRATORY_TRACT | Status: DC | PRN
  Administered 2017-12-19: 2.5 mg via RESPIRATORY_TRACT
  Filled 2017-12-18: qty 3

## 2017-12-18 MED ORDER — VANCOMYCIN HCL IN DEXTROSE 1-5 GM/200ML-% IV SOLN
1000.0000 mg | Freq: Once | INTRAVENOUS | Status: AC
  Administered 2017-12-18: 1000 mg via INTRAVENOUS
  Filled 2017-12-18: qty 200

## 2017-12-18 MED ORDER — INSULIN ASPART 100 UNIT/ML ~~LOC~~ SOLN
0.0000 [IU] | Freq: Three times a day (TID) | SUBCUTANEOUS | Status: DC
  Administered 2017-12-18: 15 [IU] via SUBCUTANEOUS
  Administered 2017-12-18: 11 [IU] via SUBCUTANEOUS
  Administered 2017-12-18 – 2017-12-20 (×6): 3 [IU] via SUBCUTANEOUS
  Administered 2017-12-21: 2 [IU] via SUBCUTANEOUS
  Administered 2017-12-21 (×2): 3 [IU] via SUBCUTANEOUS
  Administered 2017-12-22: 2 [IU] via SUBCUTANEOUS
  Filled 2017-12-18 (×12): qty 1

## 2017-12-18 MED ORDER — ACETAMINOPHEN 650 MG RE SUPP: 650.0000 mg | Freq: Four times a day (QID) | RECTAL | Status: DC | PRN

## 2017-12-18 MED ORDER — ROSUVASTATIN CALCIUM 10 MG PO TABS
40.0000 mg | ORAL_TABLET | Freq: Every day | ORAL | Status: DC
  Administered 2017-12-18 – 2017-12-21 (×4): 40 mg via ORAL
  Filled 2017-12-18 (×4): qty 4

## 2017-12-18 MED ORDER — CARVEDILOL 3.125 MG PO TABS
3.1250 mg | ORAL_TABLET | Freq: Two times a day (BID) | ORAL | Status: DC
  Administered 2017-12-19 – 2017-12-22 (×7): 3.125 mg via ORAL
  Filled 2017-12-18 (×7): qty 1

## 2017-12-18 MED ORDER — INSULIN ASPART 100 UNIT/ML ~~LOC~~ SOLN: 0.0000 [IU] | Freq: Every day | SUBCUTANEOUS | Status: DC

## 2017-12-18 MED ORDER — ACETAMINOPHEN 325 MG PO TABS
650.0000 mg | ORAL_TABLET | Freq: Four times a day (QID) | ORAL | Status: DC | PRN
  Administered 2017-12-18 – 2017-12-21 (×5): 650 mg via ORAL
  Filled 2017-12-18 (×5): qty 2

## 2017-12-18 MED ORDER — POTASSIUM CHLORIDE CRYS ER 20 MEQ PO TBCR
40.0000 meq | EXTENDED_RELEASE_TABLET | Freq: Two times a day (BID) | ORAL | Status: DC
  Administered 2017-12-18 – 2017-12-19 (×4): 40 meq via ORAL
  Filled 2017-12-18 (×4): qty 2

## 2017-12-18 MED ORDER — ONDANSETRON HCL 4 MG/2ML IJ SOLN
4.0000 mg | Freq: Four times a day (QID) | INTRAMUSCULAR | Status: DC | PRN
  Administered 2017-12-18 – 2017-12-21 (×2): 4 mg via INTRAVENOUS
  Filled 2017-12-18 (×2): qty 2

## 2017-12-18 MED ORDER — VANCOMYCIN 50 MG/ML ORAL SOLUTION
125.0000 mg | Freq: Four times a day (QID) | ORAL | Status: DC
  Administered 2017-12-18 (×2): 125 mg via ORAL
  Filled 2017-12-18 (×6): qty 2.5

## 2017-12-18 MED ORDER — SODIUM CHLORIDE 0.9 % IV SOLN
INTRAVENOUS | Status: DC
  Administered 2017-12-18: 03:00:00 via INTRAVENOUS

## 2017-12-18 MED ORDER — BISACODYL 5 MG PO TBEC: 5.0000 mg | DELAYED_RELEASE_TABLET | Freq: Every day | ORAL | Status: DC | PRN

## 2017-12-18 MED ORDER — DOCUSATE SODIUM 100 MG PO CAPS
100.0000 mg | ORAL_CAPSULE | Freq: Two times a day (BID) | ORAL | Status: DC
  Administered 2017-12-18 – 2017-12-21 (×7): 100 mg via ORAL
  Filled 2017-12-18 (×8): qty 1

## 2017-12-18 MED ORDER — POTASSIUM CHLORIDE IN NACL 40-0.9 MEQ/L-% IV SOLN
INTRAVENOUS | Status: DC
  Administered 2017-12-18 (×2): 75 mL/h via INTRAVENOUS
  Filled 2017-12-18 (×4): qty 1000

## 2017-12-18 MED ORDER — MAGNESIUM SULFATE 2 GM/50ML IV SOLN
2.0000 g | Freq: Once | INTRAVENOUS | Status: AC
  Administered 2017-12-18: 2 g via INTRAVENOUS
  Filled 2017-12-18: qty 50

## 2017-12-18 MED ORDER — SODIUM CHLORIDE 0.9 % IV SOLN
1.0000 g | Freq: Two times a day (BID) | INTRAVENOUS | Status: DC
  Administered 2017-12-18 – 2017-12-21 (×7): 1 g via INTRAVENOUS
  Filled 2017-12-18 (×9): qty 1

## 2017-12-18 MED ORDER — TRAZODONE HCL 50 MG PO TABS
50.0000 mg | ORAL_TABLET | Freq: Every day | ORAL | Status: DC
  Administered 2017-12-18: 50 mg via ORAL
  Filled 2017-12-18: qty 1

## 2017-12-18 MED ORDER — VANCOMYCIN 50 MG/ML ORAL SOLUTION: 125.0000 mg | Freq: Four times a day (QID) | ORAL | Status: DC

## 2017-12-18 MED ORDER — INSULIN GLARGINE 100 UNIT/ML ~~LOC~~ SOLN
12.0000 [IU] | Freq: Every day | SUBCUTANEOUS | Status: DC
  Administered 2017-12-18 – 2017-12-21 (×4): 12 [IU] via SUBCUTANEOUS
  Filled 2017-12-18 (×5): qty 0.12

## 2017-12-18 MED ORDER — GLUCERNA SHAKE PO LIQD
237.0000 mL | Freq: Two times a day (BID) | ORAL | Status: DC
  Administered 2017-12-18 – 2017-12-22 (×7): 237 mL via ORAL

## 2017-12-18 MED ORDER — PAROXETINE HCL 20 MG PO TABS
20.0000 mg | ORAL_TABLET | Freq: Every day | ORAL | Status: DC
  Administered 2017-12-18 – 2017-12-21 (×4): 20 mg via ORAL
  Filled 2017-12-18 (×4): qty 1

## 2017-12-18 MED ORDER — VANCOMYCIN HCL 10 G IV SOLR
1250.0000 mg | INTRAVENOUS | Status: DC
  Filled 2017-12-18: qty 1250

## 2017-12-18 MED ORDER — ADULT MULTIVITAMIN W/MINERALS CH
1.0000 | ORAL_TABLET | Freq: Every day | ORAL | Status: DC
  Administered 2017-12-18 – 2017-12-22 (×5): 1 via ORAL
  Filled 2017-12-18 (×5): qty 1

## 2017-12-18 MED ORDER — APIXABAN 5 MG PO TABS
5.0000 mg | ORAL_TABLET | Freq: Two times a day (BID) | ORAL | Status: DC
  Administered 2017-12-18 – 2017-12-22 (×9): 5 mg via ORAL
  Filled 2017-12-18 (×9): qty 1

## 2017-12-18 NOTE — Progress Notes (Signed)
Temp 98.5 after receiving tylenol

## 2017-12-18 NOTE — Consult Note (Signed)
8:11 AM   [name] 05/01/1945 [id number]  CC: Nausea, diarrhea, fever  HPI: I was asked to see [name] in consultation from Dr. Duane Boston.  She is a very comorbid 72 year old female with medical history notable for type 2 diabetes, cardiomyopathy, and DNR CODE STATUS.  She has a long history of nephrolithiasis and has been previously managed by Dr. Hollice Espy.  She most recently was admitted 11/02/2017 with an infected right proximal ureteral stone with ESBL E. coli urosepsis and underwent emergent right ureteral stent placement with the on-call urologist Dr. Gloriann Loan.  She was ultimately discharged, however she did not follow-up with urology as scheduled, as she was feeling too "weak".  She was admitted overnight to the hospitalist service with fever to 101.8, nausea, diarrhea, back pain and concern for sepsis from either urinary source or possible pneumonia.  CT overnight showed appropriate position of right-sided ureteral stent with mild hydronephrosis, however the bladder was distended.  A Foley was placed for maximal decompression.  This morning she denies any nausea, vomiting, or diarrhea.  She has some dull midline back pain.  There are no aggravating or alleviating factors.  Duration is 24 hours.  Severity is severe.   PMH: Past Medical History:  Diagnosis Date  . Anxiety   . Cardiomyopathy ([place])    a. 10/2017 Echo: EF 20-25%, diff HK, mild MR. Nl RV size.  . DDD (degenerative disc disease), lumbar   . Diverticulosis   . GERD (gastroesophageal reflux disease)   . High blood pressure   . High cholesterol   . LBBB (left bundle branch block)   . Nephrolithiasis   . Sciatica   . Type II diabetes mellitus ([place])     Surgical History: Past Surgical History:  Procedure Laterality Date  . ABDOMINAL HYSTERECTOMY    . CHOLECYSTECTOMY    . CYSTOSCOPY W/ RETROGRADES Bilateral 05/24/2017   Procedure: CYSTOSCOPY WITH RETROGRADE PYELOGRAM;  Surgeon: Hollice Espy, MD;   Location: [facility] ORS;  Service: Urology;  Laterality: Bilateral;  . CYSTOSCOPY W/ URETERAL STENT PLACEMENT Right 11/02/2017   Procedure: CYSTOSCOPY WITH RETROGRADE PYELOGRAM/URETERAL STENT PLACEMENT;  Surgeon: Lucas Mallow, MD;  Location: [facility] ORS;  Service: Urology;  Laterality: Right;  . CYSTOSCOPY/URETEROSCOPY/HOLMIUM LASER/STENT PLACEMENT Left 05/24/2017   Procedure: CYSTOSCOPY/URETEROSCOPY/HOLMIUM LASER/STENT PLACEMENT;  Surgeon: Hollice Espy, MD;  Location: [facility] ORS;  Service: Urology;  Laterality: Left;     Allergies:  Allergies  Allergen Reactions  . Codeine Nausea Only    Family History: Family History  Problem Relation Age of Onset  . Heart attack Father 20  . Bladder Cancer Neg Hx   . Kidney cancer Neg Hx     Social History:  reports that she quit smoking about 20 years ago. Her smoking use included cigarettes. She has a 22.50 pack-year smoking history. She has never used smokeless tobacco. She reports that she drank alcohol. She reports that she does not use drugs.  ROS: 8 point review of systems negative aside from those stated in the HPI.  Physical Exam: BP (!) 100/47 (BP Location: Right Arm)   Pulse 65   Temp 98 F (36.7 C)   Resp 18   Ht '5\' 2"'  (1.575 m)   Wt 85.2 kg   SpO2 96%   BMI 34.35 kg/m    Constitutional:  Alert and oriented, No acute distress. Cardiovascular: No clubbing, cyanosis, or edema. Respiratory: Normal respiratory effort, no increased work of breathing. GI: Abdomen is soft, nontender, nondistended, no  abdominal masses GU: No CVA tenderness Lymph: No cervical or inguinal lymphadenopathy. Skin: No rashes, bruises or suspicious lesions. Neurologic: Grossly intact, no focal deficits, moving all 4 extremities. Psychiatric: Normal mood and affect.  Laboratory Data: Reviewed WBC 11.6 Potassium 2.5 Creatinine 1.48, eGFR 36 Urinalysis many bacteria, more than 50 RBCs, greater than 50 WBCs, WBC clumps, nitrite negative  Pertinent  Imaging: I have personally reviewed the CT stone protocol dated twelve 08/2017.  The right ureteral stent is in appropriate position with a curl in the lower pole as well as in the bladder.  There is mild hydronephrosis in the setting of a distended bladder secondary to stent reflux.  Assessment & Plan:   In summary, [name] is a very co-morbid 72 year old female who previously underwent right ureteral stent placement with Dr. Gloriann Loan for an infected right proximal ureteral stone in the setting of ESBL E. coli UTI on 11/02/2017.  She did not follow-up with urology as scheduled.  She represents 12/17/2017 with likely sepsis from either urinary source or pneumonia.  -Continue Foley until afebrile greater than 24 hours and clinically improved -Recommend 10 to 14 days of culture appropriate antibiotics, follow-up urine culture in setting of prior ESBL E. Coli -Follow-up C. difficile testing -After completion of treatment dose antibiotics, recommend daily UTI prophylaxis with stent in place with nitrofurantoin 100 mg daily -Urology follow-up arranged for discussion of definitive management of her right sided stones with ureteroscopy, laser lithotripsy and, stent placement when infection treated and medically maximized -Call if questions   [place], [place] [address] [place], [place] [address] [phone]

## 2017-12-18 NOTE — Progress Notes (Signed)
CODE SEPSIS - PHARMACY COMMUNICATION  **Broad Spectrum Antibiotics should be administered within 1 hour of Sepsis diagnosis**  Time Code Sepsis Called/Page Received: 2159  Antibiotics Ordered: vanc/cefepime/ceftriaxone/azithro  Time of 1st antibiotic administration: 2218  Additional action taken by pharmacy:   If necessary, Name of Provider/Nurse Contacted:     Thomasene Rippleavid  Lizett Chowning ,PharmD Clinical Pharmacist  12/18/2017  12:35 AM

## 2017-12-18 NOTE — Progress Notes (Signed)
Tylenol 650mg   given for temp 101.8

## 2017-12-18 NOTE — Progress Notes (Signed)
[facility] [place] [facility]   PATIENT NAME: Kristina Archer    MR#:  [id number]  DATE OF BIRTH:  04-05-1945  SUBJECTIVE:  CHIEF COMPLAINT: Reporting shortness of breath with exertion.  Could not follow-up with urology as scheduled because she was weak.  No diarrhea and no bowel movements today.  REVIEW OF SYSTEMS:  CONSTITUTIONAL: No fever, fatigue or weakness.  EYES: No blurred or double vision.  EARS, NOSE, AND THROAT: No tinnitus or ear pain.  RESPIRATORY: No cough, shortness of breath, wheezing or hemoptysis.  CARDIOVASCULAR: No chest pain, orthopnea, edema.  GASTROINTESTINAL: No nausea, vomiting, diarrhea or abdominal pain.  GENITOURINARY: No dysuria, hematuria.  ENDOCRINE: No polyuria, nocturia,  HEMATOLOGY: No anemia, easy bruising or bleeding SKIN: No rash or lesion. MUSCULOSKELETAL: No joint pain or arthritis.   NEUROLOGIC: No tingling, numbness, weakness.  PSYCHIATRY: No anxiety or depression.   DRUG ALLERGIES:   Allergies  Allergen Reactions  . Codeine Nausea Only    VITALS:  Blood pressure (!) 96/51, pulse 63, temperature 98.2 F (36.8 C), temperature source Oral, resp. rate 16, height 5\' 2"  (1.575 m), weight 85.2 kg, SpO2 98 %.  PHYSICAL EXAMINATION:  GENERAL:  72 y.o.-year-old patient lying in the bed with no acute distress.  EYES: Pupils equal, round, reactive to light and accommodation. No scleral icterus. Extraocular muscles intact.  HEENT: Head atraumatic, normocephalic. Oropharynx and nasopharynx clear.  NECK:  Supple, no jugular venous distention. No thyroid enlargement, no tenderness.  LUNGS: Normal breath sounds bilaterally, no wheezing, rales,rhonchi or crepitation. No use of accessory muscles of respiration.  CARDIOVASCULAR: S1, S2 normal. No murmurs, rubs, or gallops.  ABDOMEN: Soft, nontender, nondistended. Bowel sounds present. No organomegaly or mass.  EXTREMITIES: No pedal edema, cyanosis, or clubbing.   NEUROLOGIC: Cranial nerves II through XII are intact. Muscle strength 5/5 in all extremities. Sensation intact. Gait not checked.  PSYCHIATRIC: The patient is alert and oriented x 3.  SKIN: No obvious rash, lesion, or ulcer.    LABORATORY PANEL:   CBC Recent Labs  Lab 12/18/17 0224  WBC 11.6*  HGB 7.8*  HCT 24.1*  PLT 136*   ------------------------------------------------------------------------------------------------------------------  Chemistries  Recent Labs  Lab 12/17/17 2057 12/18/17 0224 12/18/17 0631  NA 133* 132*  --   K 2.3* 2.5*  --   CL 97* 100  --   CO2 25 24  --   GLUCOSE 295* 378*  --   BUN 12 13  --   CREATININE 1.31* 1.48*  --   CALCIUM 9.2 8.1*  --   MG  --   --  1.7  AST 25  --   --   ALT 13  --   --   ALKPHOS 88  --   --   BILITOT 2.4*  --   --    ------------------------------------------------------------------------------------------------------------------  Cardiac Enzymes No results for input(s): TROPONINI in the last 168 hours. ------------------------------------------------------------------------------------------------------------------  RADIOLOGY:  Dg Chest Portable 1 View  Result Date: 12/17/2017 CLINICAL DATA:  Hypoxia, vomiting, weakness. EXAM: PORTABLE CHEST 1 VIEW COMPARISON:  11/07/2017. FINDINGS: Enlarged cardiac silhouette with an interval decrease in size. Interval mild patchy opacity at the left lung base and possible small left pleural effusion. Clear right lung. Normal vascularity. Diffuse osteopenia. IMPRESSION: 1. Interval mild patchy atelectasis or pneumonia at the left lung base and possible small left pleural effusion. 2. Improved cardiomegaly. Electronically Signed   By: Beckie Salts M.D.   On: 12/17/2017 21:38  Ct Renal Stone Study  Result Date: 12/17/2017 CLINICAL DATA:  Nausea, vomiting and diarrhea that began today. Back pain. Known ureteral calculus on the right with a stent. EXAM: CT ABDOMEN AND PELVIS  WITHOUT CONTRAST TECHNIQUE: Multidetector CT imaging of the abdomen and pelvis was performed following the standard protocol without IV contrast. COMPARISON:  Abdomen pelvis radiographs dated 11/03/2017 and abdomen and pelvis CT dated 11/02/2017. FINDINGS: Lower chest: Interval moderate dependent patchy opacity in the left lower lobe and mild dependent patchy opacity in the right lower lobe. Interval mild enlargement of the heart with left atrial and left ventricular enlargement. No pleural fluid seen. Hepatobiliary: No focal liver abnormality is seen. Status post cholecystectomy. No biliary dilatation. Pancreas: Unremarkable. No pancreatic ductal dilatation or surrounding inflammatory changes. Spleen: Enlarged, measuring 14.9 cm in length. Adrenals/Urinary Tract: 2 adjacent calculi in the lower pole of the right kidney, 1 measuring 6 mm in the other measuring 8 mm. 7 mm calculus in the proximal to mid right ureter at the level of the L4 vertebral body. A right renal stent remains partially and coiled in the right renal collecting system. Moderate dilatation of the right renal collecting system and proximal ureter. 5 mm lower pole left renal calculus. Otherwise, normal appearing left kidney and left ureter without hydronephrosis. Bilateral pelvic phleboliths. Normal appearing adrenal glands. Stomach/Bowel: Multiple colonic diverticula without evidence of diverticulitis. Normal appearing appendix, small bowel and stomach. Vascular/Lymphatic: Atheromatous arterial calcifications without aneurysm. No enlarged lymph nodes. Reproductive: Status post hysterectomy. No adnexal masses. The previously demonstrated right adnexal cyst is no longer seen. Other: No abdominal wall hernia or abnormality. No abdominopelvic ascites. Musculoskeletal: Lumbar and lower thoracic spine degenerative changes. IMPRESSION: 1. Moderate right hydronephrosis and proximal hydroureter. 2. 7 mm proximal to mid right ureteral calculus at the level  of the L4 vertebral body. 3. Bilateral nonobstructing renal calculi. 4. Interval moderate dependent atelectasis and possible pneumonia in the left lower lobe and mild dependent atelectasis and possible pneumonia in the right lower lobe. 5. Mild cardiomegaly with left atrial and left ventricular enlargement. 6. Stable mild splenomegaly. 7. Colonic diverticulosis. Electronically Signed   By: Beckie Salts M.D.   On: 12/17/2017 22:57    EKG:   Orders placed or performed during the hospital encounter of 11/02/17  . ED EKG  . ED EKG  . EKG 12-Lead  . EKG 12-Lead  . EKG 12-Lead  . EKG 12-Lead  . EKG 12-Lead  . EKG 12-Lead  . EKG 12-Lead  . EKG 12-Lead  . EKG 12-Lead  . EKG 12-Lead  . EKG 12-Lead  . EKG 12-Lead  . EKG 12-Lead  . EKG 12-Lead  . EKG 12-Lead  . EKG 12-Lead    ASSESSMENT AND PLAN:    1.  Sepsis, likely secondary to pneumonia and UTI.   On broad-spectrum IV antibiotics and IV fluids.   Continue supportive measures with oxygen as needed pain control.     Will check stool for C. Difficile infection as she has a watery bowel movement, patient denies any 2.  HCAP,  broad-spectrum IV antibiotics, oxygen and nebulizer. 3.  UTI, secondary to kidney stone.  Patient is status post recent right ureteral stent placement.   Seen by urology reports that patient missed her follow-up appointment Recommending outpatient follow-up with urology after discharge regarding the kidney stone  4.  Diabetes type 2.  Will monitor blood sugars before meals and at bedtime and use insulin treatment during the hospital stay. 5. ARF/CKD3, likely  prerenal. IV fluids and monitor kidney function closely.  Avoid nephrotoxic medications. 6.  Hypokalemia, likely secondary to diarrhea.    Repleted potassium at 3.5    All the records are reviewed and case discussed with Care Management/Social Workerr. Management plans discussed with the patient, family and they are in agreement.  CODE STATUS: dnr    TOTAL TIME TAKING CARE OF THIS PATIENT: 36  minutes.   POSSIBLE D/C IN 2  DAYS, DEPENDING ON CLINICAL CONDITION.  Note: This dictation was prepared with Dragon dictation along with smaller phrase technology. Any transcriptional errors that result from this process are unintentional.   Ramonita LabAruna Di Jasmer M.D on 12/18/2017 at 4:28 PM  Between 7am to 6pm - Pager - [phone] After 6pm go to [email or web address] - password [email or web address] [facility]  [facility]  Office  [phone]  CC: Primary care physician; Corky DownsMasoud, [name], MD

## 2017-12-18 NOTE — Progress Notes (Signed)
Initial Nutrition Assessment  DOCUMENTATION CODES:   Obesity unspecified  INTERVENTION:  Glucerna Shake po BID, each supplement provides 220 kcal and 10 grams of protein (pt likes chocolate) MVI daily    NUTRITION DIAGNOSIS:   Increased nutrient needs related to acute illness(sepsis) as evidenced by estimated needs, energy intake < 75% for > 7 days, per patient/family report.   GOAL:   Patient will meet greater than or equal to 90% of their needs   MONITOR:   PO intake, Weight trends, Supplement acceptance, I & O's, Labs  REASON FOR ASSESSMENT:   Malnutrition Screening Tool    ASSESSMENT:  72 year old female with known history of CKD, cardiomyopathy, HTN, T2DM, Diverticulosis, GERD, HLD, Nephrolithiasis, Cholecystectomy, Cytoscopy w/ ureteroscopy stent presented to ED for n/v x 24hrs and rt flank pain secondary to ureteral stone and stent placement a few weeks ago.    Patient had emergency rt ureteral stent placement on 11/02/17. Per urology physician note she reported feeling too weak to return for follow up with urology as scheduled.  Pt reports feeling a little better this afternoon and denies n/v today. Patient reports improvements to intake and appetite with 100% of oatmeal for breakfast and most of vegetable soup for lunch. Patient wears dentures and stated her son was bringing them this evening when he comes to visit. RD offered to modify diet to softer foods if son is unable to find them.   Prior to recent hospitalizations patient reports 3 meals/day. Breakfast is usually a couple pieces of cheese toast and coffee, sandwiches w/ chips for lunch and dinner patient reports using the crockpot a lot. Recently she recalls making stew beef and onions with green beans. Patient does enjoy [employer] approximately 1/week and orders Jr. Cheeseburger, fries, and small frosty.   Patient endorses recent wt losses recalls UBW 195lb 6 months ago.  Medications reviewed and include:  Colace, novoLog (0-15units) with meals, (0-5 units) at bedtime, MVI, Protonix, KCl 40mEq tablet BID, Vancomycin, Hydrocodone, Zofran, Trazodone  0.9%NaCl w/KCL 6240mEq/L @75mL /hr  Labs: Na 132 (L) replacing K 2.5 (L) replacing Glucose 378 (H) Cr 1.48 (H) Ca 8.1 (L) albumin not available for correction   NUTRITION - FOCUSED PHYSICAL EXAM:    Most Recent Value  Orbital Region  Mild depletion  Upper Arm Region  Mild depletion  Thoracic and Lumbar Region  No depletion  Buccal Region  Mild depletion  Temple Region  Mild depletion  Clavicle Bone Region  No depletion  Clavicle and Acromion Bone Region  No depletion  Scapular Bone Region  No depletion  Dorsal Hand  Mild depletion  Patellar Region  No depletion  Anterior Thigh Region  No depletion  Posterior Calf Region  No depletion  Edema (RD Assessment)  None  Hair  Reviewed  Eyes  Reviewed  Mouth  Reviewed [dentures,  not with pt]  Skin  Reviewed  Nails  Reviewed       Diet Order:   Diet Order            Diet heart healthy/carb modified Room service appropriate? Yes; Fluid consistency: Thin  Diet effective now              EDUCATION NEEDS:   No education needs have been identified at this time  Skin:  Skin Assessment: Reviewed RN Assessment  Last BM:  12/17/2017; WDL  Height:   Ht Readings from Last 1 Encounters:  12/18/17 5\' 2"  (1.575 m)    Weight:   Wt  Readings from Last 1 Encounters:  12/18/17 85.2 kg    Ideal Body Weight:  50 kg  BMI:  Body mass index is 34.35 kg/m.  Estimated Nutritional Needs:   Kcal:  1800-2130  Protein:  65-75g/day (1.3-1.5 IBW)  Fluid:  </=1.8L/day    Lars Masson, RD, LDN  After Hours/Weekend Pager: [phone]

## 2017-12-18 NOTE — Progress Notes (Addendum)
Pharmacy Antibiotic Note  Kristina Archer is a 72 y.o. female admitted on 12/17/2017 with pneumonia/UTI w/ h/o resistance E. Coli on UCx.  Pharmacy has been consulted for vanc/meropnem dosing. Patient received vanc 1g, cefepime 2g, ceftriaxone 2g, azithromycin 500 mg IV x 1 in ED.  Plan: 12/09 @ 0500 am labs show Scr rose 1.48 << 1.31. Patient is in AKI, will d/c vanc regimen and draw a 12 hr random vanc and dose per levels. Will dose 15 mg/kg IV x 1 if random level < 20 mcg/mL  Will continue vanc 1.25g IV q24h w/ 6 hour stack Will draw vanc trough 12/12 @ 0600 prior to 4th dose. Will start meropenem 1g IV q12h  Ke 0.0403 T1/2 ~ 24 hours Goal trough 15 - 20 mcg/mL  Height: 5\' 3"  (160 cm) Weight: 200 lb (90.7 kg) IBW/kg (Calculated) : 52.4  Temp (24hrs), Avg:97.9 F (36.6 C), Min:97.9 F (36.6 C), Max:97.9 F (36.6 C)  Recent Labs  Lab 12/17/17 2057 12/17/17 2111 12/17/17 2256  WBC 6.0  --   --   CREATININE 1.31*  --   --   LATICACIDVEN  --  2.95* 2.1*    Estimated Creatinine Clearance: 43.3 mL/min (A) (by C-G formula based on SCr of 1.31 mg/dL (H)).    Allergies  Allergen Reactions  . Codeine Nausea Only    Thank you for allowing pharmacy to be a part of this patient's care.  Thomasene Rippleavid Parish Dubose, PharmD, BCPS Clinical Pharmacist 12/18/2017

## 2017-12-18 NOTE — Progress Notes (Signed)
Pharmacy Electrolyte Monitoring Consult:  Pharmacy consulted to assist in monitoring and replacing electrolytes in this 72 y.o. female admitted on 12/17/2017 with Vomiting and Diarrhea   Labs:  Sodium (mmol/L)  Date Value  12/18/2017 132 (L)  10/11/2011 143   Potassium (mmol/L)  Date Value  12/18/2017 2.5 (LL)  10/11/2011 3.4 (L)   Magnesium (mg/dL)  Date Value  16/10/960412/09/2017 1.7   Phosphorus (mg/dL)  Date Value  54/09/811911/01/2017 2.8   Calcium (mg/dL)  Date Value  14/78/295612/09/2017 8.1 (L)   Calcium, Total (mg/dL)  Date Value  21/30/865710/01/2011 10.0   Albumin (g/dL)  Date Value  84/69/629512/08/2017 2.9 (L)  10/11/2011 4.3    Assessment/Plan: 12/09 @ 0200 K 2.5, Mg pending. Spoke to hospitalist to start NS + 40 mEq IV @ 75 ml/hr for now since patient has cardiomegaly. Will replace as needed when mag level results. Will recheck electrolytes w/ am labs.  Mg - 1.7 Will order Magnesium Sulf 2 gm IV once.  Will recheck tomorrow with morning labs.  Abbie Sonshris [place] Thielman, PharmD Clinical Pharmacist 12/18/2017

## 2017-12-18 NOTE — Progress Notes (Signed)
Pharmacy Electrolyte Monitoring Consult:  Pharmacy consulted to assist in monitoring and replacing electrolytes in this 72 y.o. female admitted on 12/17/2017 with Vomiting and Diarrhea   Labs:  Sodium (mmol/L)  Date Value  12/18/2017 132 (L)  10/11/2011 143   Potassium (mmol/L)  Date Value  12/18/2017 2.5 (LL)  10/11/2011 3.4 (L)   Magnesium (mg/dL)  Date Value  11/91/478211/01/2017 2.1   Phosphorus (mg/dL)  Date Value  95/62/130811/01/2017 2.8   Calcium (mg/dL)  Date Value  65/78/469612/09/2017 8.1 (L)   Calcium, Total (mg/dL)  Date Value  29/52/841310/01/2011 10.0   Albumin (g/dL)  Date Value  24/40/102712/08/2017 2.9 (L)  10/11/2011 4.3    Assessment/Plan: 12/09 @ 0200 K 2.5, Mg pending. Spoke to hospitalist to start NS + 40 mEq IV @ 75 ml/hr for now since patient has cardiomegaly. Will replace as needed when mag level results. Will recheck electrolytes w/ am labs.  Thomasene Rippleavid Madge Therrien, PharmD, BCPS Clinical Pharmacist 12/18/2017

## 2017-12-18 NOTE — Progress Notes (Signed)
Inpatient Diabetes Program Recommendations  AACE/ADA: New Consensus Statement on Inpatient Glycemic Control (2015)  Target Ranges:  Prepandial:   less than 140 mg/dL      Peak postprandial:   less than 180 mg/dL (1-2 hours)      Critically ill patients:  140 - 180 mg/dL   Results for Kristina MooreRILEY, [name] (MRN [id number]) as of 12/18/2017 14:11  Ref. Range 12/18/2017 07:47 12/18/2017 11:57  Glucose-Capillary Latest Ref Range: 70 - 99 mg/dL 829381 (H)  15 units NOVOLOG  314 (H)  11 units NOVOLOG     Admit Sepsis/ Pneumonia/ UTI  History: DM, CKD  Home DM Meds: Metformin 500 mg BID        Amaryl 2 mg Daily      Januvia 100 mg Daily  Current Orders: Novolog Moderate Correction Scale/ SSI (0-15 units) TID AC + HS     Novolog SSI started at 8am today.  CBGs running very high--Possibly from the Sepsis/ Pneumonia/ UTI?     MD- If patient continues to have severely elevated CBGs, please consider the following:  Start Lantus 12 units QHS (0.15 units/kg dosing based on weight of 85 kg)     --Will follow patient during hospitalization--  Ambrose FinlandJeannine Johnston Carrson Lightcap RN, MSN, CDE Diabetes Coordinator Inpatient Glycemic Control Team Team Pager: [phone] (8a-5p)

## 2017-12-18 NOTE — H&P (Signed)
[facility] - [place] at [facility]   PATIENT NAME: Kristina Archer    MR#:  [id number]  DATE OF BIRTH:  [birth year]  DATE OF ADMISSION:  12/17/2017  PRIMARY CARE PHYSICIAN: Corky Downs, MD   REQUESTING/REFERRING PHYSICIAN:   CHIEF COMPLAINT:   Chief Complaint  Patient presents with  . Vomiting  . Diarrhea    HISTORY OF PRESENT ILLNESS: Kristina Archer  is a 72 y.o. female with a known history of CKD, cardiomyopathy, hypertension, type 2 diabetes and other comorbidities. Patient was brought to emergency room for nausea, vomiting and diarrhea going on for the past 24 hours, gradually getting worse.  Per EMS report, she was found at home on her couch laying in a pool of liquid stool.  She complains of right flank pain that has been going on for the past few weeks, secondary to right ureteral stone, for which she recently underwent ureteral stent placement, few weeks ago. At the arrival to emergency room patient was noted to be lethargic and febrile with temperature at 101.8.  Initial blood pressure was 97/48.  BP improved with IV fluids. Blood test done in the emergency room are notable for elevated lactic acid level at 2.9, platelet count 136, creatinine level 1.48,  WBC 11.6, hemoglobin 9.7.  UA is positive for UTI. There is left lower lobe pneumonia noted per CT scan and right hydronephrosis still present. Is admitted for further evaluation and treatment.   PAST MEDICAL HISTORY:   Past Medical History:  Diagnosis Date  . Anxiety   . Cardiomyopathy (HCC)    a. 10/2017 Echo: EF 20-25%, diff HK, mild MR. Nl RV size.  . DDD (degenerative disc disease), lumbar   . Diverticulosis   . GERD (gastroesophageal reflux disease)   . High blood pressure   . High cholesterol   . LBBB (left bundle branch block)   . Nephrolithiasis   . Sciatica   . Type II diabetes mellitus (HCC)     PAST SURGICAL HISTORY:  Past Surgical History:  Procedure Laterality Date  .  ABDOMINAL HYSTERECTOMY    . CHOLECYSTECTOMY    . CYSTOSCOPY W/ RETROGRADES Bilateral 05/24/2017   Procedure: CYSTOSCOPY WITH RETROGRADE PYELOGRAM;  Surgeon: Vanna Scotland, MD;  Location: [facility] ORS;  Service: Urology;  Laterality: Bilateral;  . CYSTOSCOPY W/ URETERAL STENT PLACEMENT Right 11/02/2017   Procedure: CYSTOSCOPY WITH RETROGRADE PYELOGRAM/URETERAL STENT PLACEMENT;  Surgeon: Crista Elliot, MD;  Location: [facility] ORS;  Service: Urology;  Laterality: Right;  . CYSTOSCOPY/URETEROSCOPY/HOLMIUM LASER/STENT PLACEMENT Left 05/24/2017   Procedure: CYSTOSCOPY/URETEROSCOPY/HOLMIUM LASER/STENT PLACEMENT;  Surgeon: Vanna Scotland, MD;  Location: [facility] ORS;  Service: Urology;  Laterality: Left;    SOCIAL HISTORY:  Social History   Tobacco Use  . Smoking status: Former Smoker    Packs/day: 1.50    Years: 15.00    Pack years: 22.50    Types: Cigarettes    Last attempt to quit: 05/12/1997    Years since quitting: 20.6  . Smokeless tobacco: Never Used  Substance Use Topics  . Alcohol use: Not Currently    FAMILY HISTORY:  Family History  Problem Relation Age of Onset  . Heart attack Father 12  . Bladder Cancer Neg Hx   . Kidney cancer Neg Hx     DRUG ALLERGIES:  Allergies  Allergen Reactions  . Codeine Nausea Only    REVIEW OF SYSTEMS:   CONSTITUTIONAL: Positive for fever, fatigue and generalized weakness.  EYES: No changes in vision.  EARS,  NOSE, AND THROAT: No tinnitus or ear pain.  RESPIRATORY: No cough, shortness of breath, wheezing or hemoptysis.  CARDIOVASCULAR: No chest pain, orthopnea, edema.  GASTROINTESTINAL: Positive for nausea, vomiting, diarrhea.  Positive for right flank pain.  GENITOURINARY: No dysuria, hematuria.  ENDOCRINE: No polyuria, nocturia. HEMATOLOGY: No bleeding. SKIN: No rash or lesion. MUSCULOSKELETAL: No joint pain at this time.   NEUROLOGIC: No focal weakness.  PSYCHIATRY: No anxiety or depression.   MEDICATIONS AT HOME:  Prior to Admission  medications   Medication Sig Start Date End Date Taking? Authorizing Provider  albuterol (PROVENTIL HFA;VENTOLIN HFA) 108 (90 Base) MCG/ACT inhaler Inhale 2 puffs into the lungs every 6 (six) hours as needed for wheezing or shortness of breath. 11/13/17  Yes Katha Hamming, MD  amiodarone (PACERONE) 200 MG tablet Take 1 tablet (200 mg total) by mouth 2 (two) times daily. 11/13/17  Yes Katha Hamming, MD  apixaban (ELIQUIS) 5 MG TABS tablet Take 1 tablet (5 mg total) by mouth 2 (two) times daily. 11/13/17  Yes Katha Hamming, MD  carvedilol (COREG) 3.125 MG tablet Take 1 tablet (3.125 mg total) by mouth 2 (two) times daily with a meal. 11/13/17  Yes Katha Hamming, MD  docusate sodium (COLACE) 100 MG capsule Take 1 capsule (100 mg total) by mouth 2 (two) times daily. 05/24/17  Yes Vanna Scotland, MD  furosemide (LASIX) 20 MG tablet Take 20 mg by mouth daily.   Yes [provider]  lisinopril (PRINIVIL,ZESTRIL) 2.5 MG tablet Take 1 tablet (2.5 mg total) by mouth daily. 11/13/17  Yes Katha Hamming, MD  metFORMIN (GLUCOPHAGE-XR) 500 MG 24 hr tablet Take 500 mg by mouth 2 (two) times daily.   Yes [provider]  Multiple Vitamin (MULTIVITAMIN WITH MINERALS) TABS tablet Take 1 tablet by mouth daily.   Yes [provider]  PARoxetine (PAXIL) 20 MG tablet Take 20 mg by mouth at bedtime.   Yes [provider]  rosuvastatin (CRESTOR) 40 MG tablet Take 1 tablet (40 mg total) by mouth daily. 11/13/17  Yes Katha Hamming, MD  traZODone (DESYREL) 50 MG tablet Take 1 tablet by mouth at bedtime. 11/30/17  Yes [provider]  dexlansoprazole (DEXILANT) 60 MG capsule Take 60 mg by mouth daily before breakfast.    [provider]  gabapentin (NEURONTIN) 100 MG capsule Take 100 mg by mouth 3 (three) times daily as needed (back pain).    [provider]  glimepiride (AMARYL) 2 MG tablet Take 2 mg by mouth daily with breakfast.     [provider]  oxybutynin (DITROPAN) 5 MG tablet Take 1 tablet (5 mg total) by mouth every 8 (eight) hours as needed for bladder spasms. Patient not taking: Reported on 11/02/2017 05/24/17   Vanna Scotland, MD  sitaGLIPtin (JANUVIA) 100 MG tablet Take 100 mg by mouth daily.    [provider]  tamsulosin (FLOMAX) 0.4 MG CAPS capsule Take 1 capsule (0.4 mg total) by mouth daily. Patient not taking: Reported on 11/02/2017 05/24/17   Vanna Scotland, MD      PHYSICAL EXAMINATION:   VITAL SIGNS: Blood pressure (!) 103/44, pulse 81, temperature 97.9 F (36.6 C), temperature source Oral, resp. rate (!) 24, height 5\' 3"  (1.6 m), weight 90.7 kg, SpO2 90 %.  GENERAL:  72 y.o.-year-old patient lying in the bed.  She looks acutely ill, lethargic. EYES: Pupils equal, round, reactive to light and accommodation. No scleral icterus. Extraocular muscles intact.  HEENT: Head atraumatic, normocephalic. Oropharynx and nasopharynx clear.  NECK:  Supple, no jugular venous distention. No thyroid enlargement, no tenderness.  LUNGS: Reduced breath sounds bilaterally, no wheezing, rales,rhonchi or crepitation. No use of accessory muscles of respiration.  CARDIOVASCULAR: S1, S2 normal. No S3/S4.  ABDOMEN: There is right flank pain with palpation.otherwise, the abdomen is soft, nondistended. Bowel sounds present. No organomegaly or mass.  EXTREMITIES: No pedal edema, cyanosis, or clubbing.  NEUROLOGIC: No focal weakness. PSYCHIATRIC: The patient is alert and oriented x 3, but lethargic.  SKIN: No obvious rash, lesion, or ulcer.   LABORATORY PANEL:   CBC Recent Labs  Lab 12/17/17 2057  WBC 6.0  HGB 9.7*  HCT 30.2*  PLT 128*  MCV 84.4  MCH 27.1  MCHC 32.1  RDW 15.3  LYMPHSABS 0.4*  MONOABS 0.1  EOSABS 0.2  BASOSABS 0.0   ------------------------------------------------------------------------------------------------------------------  Chemistries  Recent Labs  Lab  12/17/17 2057  NA 133*  K 2.3*  CL 97*  CO2 25  GLUCOSE 295*  BUN 12  CREATININE 1.31*  CALCIUM 9.2  AST 25  ALT 13  ALKPHOS 88  BILITOT 2.4*   ------------------------------------------------------------------------------------------------------------------ estimated creatinine clearance is 43.3 mL/min (A) (by C-G formula based on SCr of 1.31 mg/dL (H)). ------------------------------------------------------------------------------------------------------------------ No results for input(s): TSH, T4TOTAL, T3FREE, THYROIDAB in the last 72 hours.  Invalid input(s): FREET3   Coagulation profile No results for input(s): INR, PROTIME in the last 168 hours. ------------------------------------------------------------------------------------------------------------------- No results for input(s): DDIMER in the last 72 hours. -------------------------------------------------------------------------------------------------------------------  Cardiac Enzymes No results for input(s): CKMB, TROPONINI, MYOGLOBIN in the last 168 hours.  Invalid input(s): CK ------------------------------------------------------------------------------------------------------------------ Invalid input(s): POCBNP  ---------------------------------------------------------------------------------------------------------------  Urinalysis    Component Value Date/Time   COLORURINE YELLOW (A) 12/17/2017 2057   APPEARANCEUR CLOUDY (A) 12/17/2017 2057   APPEARANCEUR Clear 06/16/2017 1049   LABSPEC 1.006 12/17/2017 2057   LABSPEC 1.004 10/11/2011 0432   PHURINE 6.0 12/17/2017 2057   GLUCOSEU NEGATIVE 12/17/2017 2057   GLUCOSEU 50 mg/dL 40/98/1191 4782   HGBUR LARGE (A) 12/17/2017 2057   BILIRUBINUR NEGATIVE 12/17/2017 2057   BILIRUBINUR Negative 06/16/2017 1049   BILIRUBINUR Negative 10/11/2011 0432   KETONESUR NEGATIVE 12/17/2017 2057   PROTEINUR 100 (A) 12/17/2017 2057   NITRITE NEGATIVE 12/17/2017  2057   LEUKOCYTESUR LARGE (A) 12/17/2017 2057   LEUKOCYTESUR 1+ (A) 06/16/2017 1049   LEUKOCYTESUR Negative 10/11/2011 0432     RADIOLOGY: Dg Chest Portable 1 View  Result Date: 12/17/2017 CLINICAL DATA:  Hypoxia, vomiting, weakness. EXAM: PORTABLE CHEST 1 VIEW COMPARISON:  11/07/2017. FINDINGS: Enlarged cardiac silhouette with an interval decrease in size. Interval mild patchy opacity at the left lung base and possible small left pleural effusion. Clear right lung. Normal vascularity. Diffuse osteopenia. IMPRESSION: 1. Interval mild patchy atelectasis or pneumonia at the left lung base and possible small left pleural effusion. 2. Improved cardiomegaly. Electronically Signed   By: Beckie Salts M.D.   On: 12/17/2017 21:38   Ct Renal Stone Study  Result Date: 12/17/2017 CLINICAL DATA:  Nausea, vomiting and diarrhea that began today. Back pain. Known ureteral calculus on the right with a stent. EXAM: CT ABDOMEN AND PELVIS WITHOUT CONTRAST TECHNIQUE: Multidetector CT imaging of the abdomen and pelvis was performed following the standard protocol without IV contrast. COMPARISON:  Abdomen pelvis radiographs dated 11/03/2017 and abdomen and pelvis CT dated 11/02/2017. FINDINGS: Lower chest: Interval moderate dependent patchy opacity in the left lower lobe and mild dependent patchy opacity in the right lower lobe. Interval mild enlargement of the heart with left atrial  and left ventricular enlargement. No pleural fluid seen. Hepatobiliary: No focal liver abnormality is seen. Status post cholecystectomy. No biliary dilatation. Pancreas: Unremarkable. No pancreatic ductal dilatation or surrounding inflammatory changes. Spleen: Enlarged, measuring 14.9 cm in length. Adrenals/Urinary Tract: 2 adjacent calculi in the lower pole of the right kidney, 1 measuring 6 mm in the other measuring 8 mm. 7 mm calculus in the proximal to mid right ureter at the level of the L4 vertebral body. A right renal stent remains  partially and coiled in the right renal collecting system. Moderate dilatation of the right renal collecting system and proximal ureter. 5 mm lower pole left renal calculus. Otherwise, normal appearing left kidney and left ureter without hydronephrosis. Bilateral pelvic phleboliths. Normal appearing adrenal glands. Stomach/Bowel: Multiple colonic diverticula without evidence of diverticulitis. Normal appearing appendix, small bowel and stomach. Vascular/Lymphatic: Atheromatous arterial calcifications without aneurysm. No enlarged lymph nodes. Reproductive: Status post hysterectomy. No adnexal masses. The previously demonstrated right adnexal cyst is no longer seen. Other: No abdominal wall hernia or abnormality. No abdominopelvic ascites. Musculoskeletal: Lumbar and lower thoracic spine degenerative changes. IMPRESSION: 1. Moderate right hydronephrosis and proximal hydroureter. 2. 7 mm proximal to mid right ureteral calculus at the level of the L4 vertebral body. 3. Bilateral nonobstructing renal calculi. 4. Interval moderate dependent atelectasis and possible pneumonia in the left lower lobe and mild dependent atelectasis and possible pneumonia in the right lower lobe. 5. Mild cardiomegaly with left atrial and left ventricular enlargement. 6. Stable mild splenomegaly. 7. Colonic diverticulosis. Electronically Signed   By: Beckie SaltsSteven  Reid M.D.   On: 12/17/2017 22:57    EKG: Orders placed or performed during the hospital encounter of 11/02/17  . ED EKG  . ED EKG  . EKG 12-Lead  . EKG 12-Lead  . EKG 12-Lead  . EKG 12-Lead  . EKG 12-Lead  . EKG 12-Lead  . EKG 12-Lead  . EKG 12-Lead  . EKG 12-Lead  . EKG 12-Lead  . EKG 12-Lead  . EKG 12-Lead  . EKG 12-Lead  . EKG 12-Lead  . EKG 12-Lead  . EKG 12-Lead    IMPRESSION AND PLAN:  1.  Sepsis, likely secondary to pneumonia and UTI.  Will start treatment with broad-spectrum IV antibiotics and IV fluids.  Continue supportive measures with oxygen as needed  and pain control.  Continue to monitor patient clinically closely and follow lactic acid level and cultures results.  Will check stool for C. Difficile infection. 2.  HCAP, will start treatment with broad-spectrum IV antibiotics, oxygen and nebulizer. 3.  UTI, secondary to kidney stone.  Patient is status post recent right ureteral stent placement.  We will start IV antibiotics per most recent urine culture report.  Urology is consulted for further evaluation and treatment. 4.  Diabetes type 2.  Will monitor blood sugars before meals and at bedtime and use insulin treatment during the hospital stay. 5. ARF/CKD3, likely prerenal.  We will start IV fluids and monitor kidney function closely.  Avoid nephrotoxic medications. 6.  Hypokalemia, likely secondary to diarrhea.  Will replace potassium per protocol.  All the records are reviewed and case discussed with ED provider. Management plans discussed with the patient, family and they are in agreement.  CODE STATUS: DNR Code Status History    Date Active Date Inactive Code Status Order ID Comments User Context   11/04/2017 1129 11/13/2017 1657 DNR [id number]  Katha HammingKonidena, Snehalatha, MD Inpatient   11/02/2017 1524 11/04/2017 1129 Full Code [id number]  Katha HammingKonidena, Snehalatha, MD  ED    Questions for Most Recent Historical Code Status (Order [id number])    Question Answer Comment   In the event of cardiac or respiratory ARREST Do not call a "code blue"    In the event of cardiac or respiratory ARREST Do not perform Intubation, CPR, defibrillation or ACLS    In the event of cardiac or respiratory ARREST Use medication by any route, position, wound care, and other measures to relive pain and suffering. May use oxygen, suction and manual treatment of airway obstruction as needed for comfort.    Comments NMP        TOTAL TIME TAKING CARE OF THIS PATIENT: 55 minutes.    Cammy Copa M.D on 12/18/2017 at 1:38 AM  Between 7am to 6pm - Pager -  [phone]  After 6pm go to [email or web address] - password [email or web address] [facility] Physicians  at [facility]  [phone]  CC: Primary care physician; Corky Downs, MD

## 2017-12-18 NOTE — Progress Notes (Signed)
Notified Dr Marjie SkiffSridharan of 2.5 potassium. New order received for K-Dur

## 2017-12-19 ENCOUNTER — Inpatient Hospital Stay: Payer: Medicare HMO

## 2017-12-19 LAB — COMPREHENSIVE METABOLIC PANEL
ALT: 13 U/L (ref 0–44)
AST: 21 U/L (ref 15–41)
Albumin: 2.4 g/dL — ABNORMAL LOW (ref 3.5–5.0)
Alkaline Phosphatase: 60 U/L (ref 38–126)
Anion gap: 2 — ABNORMAL LOW (ref 5–15)
BILIRUBIN TOTAL: 0.8 mg/dL (ref 0.3–1.2)
BUN: 21 mg/dL (ref 8–23)
CO2: 25 mmol/L (ref 22–32)
Calcium: 8.8 mg/dL — ABNORMAL LOW (ref 8.9–10.3)
Chloride: 110 mmol/L (ref 98–111)
Creatinine, Ser: 1.47 mg/dL — ABNORMAL HIGH (ref 0.44–1.00)
GFR calc Af Amer: 42 mL/min — ABNORMAL LOW (ref >60)
GFR, EST NON AFRICAN AMERICAN: 36 mL/min — AB (ref >60)
Glucose, Bld: 151 mg/dL — ABNORMAL HIGH (ref 70–99)
Potassium: 4.2 mmol/L (ref 3.5–5.1)
Sodium: 137 mmol/L (ref 135–145)
TOTAL PROTEIN: 5.6 g/dL — AB (ref 6.5–8.1)

## 2017-12-19 LAB — GLUCOSE, CAPILLARY
GLUCOSE-CAPILLARY: 164 mg/dL — AB (ref 70–99)
Glucose-Capillary: 153 mg/dL — ABNORMAL HIGH (ref 70–99)
Glucose-Capillary: 158 mg/dL — ABNORMAL HIGH (ref 70–99)
Glucose-Capillary: 125 mg/dL — ABNORMAL HIGH (ref 70–99)

## 2017-12-19 LAB — MAGNESIUM: Magnesium: 2.5 mg/dL — ABNORMAL HIGH (ref 1.7–2.4)

## 2017-12-19 MED ORDER — ALPRAZOLAM 0.5 MG PO TABS
0.5000 mg | ORAL_TABLET | Freq: Once | ORAL | Status: AC
  Administered 2017-12-19: 0.5 mg via ORAL
  Filled 2017-12-19: qty 1

## 2017-12-19 MED ORDER — SODIUM CHLORIDE 0.9 % IV SOLN
INTRAVENOUS | Status: AC
  Administered 2017-12-19: 15:00:00 via INTRAVENOUS

## 2017-12-19 MED ORDER — FUROSEMIDE 10 MG/ML IJ SOLN
40.0000 mg | Freq: Once | INTRAMUSCULAR | Status: AC
  Administered 2017-12-19: 40 mg via INTRAVENOUS
  Filled 2017-12-19: qty 4

## 2017-12-19 MED ORDER — ZOLPIDEM TARTRATE 5 MG PO TABS
5.0000 mg | ORAL_TABLET | Freq: Every day | ORAL | Status: DC
  Administered 2017-12-19: 5 mg via ORAL
  Filled 2017-12-19 (×2): qty 1

## 2017-12-19 NOTE — Clinical Social Work Note (Addendum)
Clinical Social Work Assessment  Patient Details  Name: Kristina Archer MRN: [id number] Date of Birth: Feb 08, 1945  Date of referral:  12/19/17               Reason for consult:  Facility Placement                Permission sought to share information with:  Chartered certified accountant granted to share information::  Yes, Verbal Permission Granted  Name::      [place]::   [place]   Relationship::     Contact Information:     Housing/Transportation Living arrangements for the past 2 months:  [place] of Information:  Patient Patient Interpreter Needed:  None Criminal Activity/Legal Involvement Pertinent to Current Situation/Hospitalization:  No - Comment as needed Significant Relationships:  Adult Children Lives with:  Self Do you feel safe going back to the place where you live?  Yes Need for family participation in patient care:  Yes (Comment)  Care giving concerns:  Patient lives alone in an apartment in [place].    Social Worker assessment / plan:  Holiday representative (CSW) reviewed chart and noted that PT is recommending SNF. CSW met with patient alone at bedside to discuss D/C plan. Patient was alert and oriented X4 and was sitting up in the chair. CSW introduced self and explained role of CSW department. Per patient she lives alone in an apartment and her son [name] is her primary support. CSW explained SNF process and that Mcarthur Rossetti will have to approve SNF. Patient is agreeable to SNF search in [facility] and reported that her brother Delfino Lovett is a long term care resident at [facility]. FL2 complete and faxed out.   CSW presented bed offers to patient and discussed quality measures. Patient chose [facility]. Per [facility] admissions coordinator at [facility] she will start [facility] authorization. CSW left patient's son [name] a Advertising account executive. CSW will continue to follow and assist as needed.    Patient's son [name] called CSW back and was made aware of above. Son is in agreement with D/C plan.   Employment status:  Disabled (Comment on whether or not currently receiving Disability), Retired Forensic scientist:  Medicaid In [place], [employer] PT Recommendations:  [place] / Referral to community resources:  [place]  Patient/Family's Response to care:  Patient chose [facility].   Patient/Family's Understanding of and Emotional Response to Diagnosis, Current Treatment, and Prognosis:  Patient was very pleasant and thanked CSW for assistance.   Emotional Assessment Appearance:  Appears stated age Attitude/Demeanor/Rapport:    Affect (typically observed):  Accepting, Adaptable, Pleasant Orientation:  Oriented to Self, Oriented to Place, Oriented to  Time, Oriented to Situation Alcohol / Substance use:  Not Applicable Psych involvement (Current and /or in the community):  No (Comment)  Discharge Needs  Concerns to be addressed:  Discharge Planning Concerns Readmission within the last 30 days:  No Current discharge risk:  Dependent with Mobility Barriers to Discharge:  Continued Medical Work up   [employer], Veronia Beets, LCSW 12/19/2017, 4:58 PM

## 2017-12-19 NOTE — Progress Notes (Signed)
Patient o2 saturation in the 80's MD notified. Orders received. Will continue to monitor

## 2017-12-19 NOTE — Clinical Social Work Placement (Signed)
   CLINICAL SOCIAL WORK PLACEMENT  NOTE  Date:  12/19/2017  Patient Details  Name: Kristina Archer MRN: [id number] Date of Birth: February 19, 1945  Clinical Social Work is seeking post-discharge placement for this patient at the Skilled  Nursing Facility level of care (*CSW will initial, date and re-position this form in  chart as items are completed):  Yes   Patient/family provided with [facility] Clinical Social Work Department's list of facilities offering this level of care within the geographic area requested by the patient (or if unable, by the patient's family).  Yes   Patient/family informed of their freedom to choose among providers that offer the needed level of care, that participate in Medicare, Medicaid or managed care program needed by the patient, have an available bed and are willing to accept the patient.  Yes   Patient/family informed of [place]'s ownership interest [facility] and [facility], as well as of the fact that they are under no obligation to receive care at these facilities.  PASRR submitted to EDS on       PASRR number received on       Existing PASRR number confirmed on 12/19/17     FL2 transmitted to all facilities in geographic area requested by pt/family on 12/19/17     FL2 transmitted to all facilities within larger geographic area on       Patient informed that his/her managed care company has contracts with or will negotiate with certain facilities, including the following:        Yes   Patient/family informed of bed offers received.  Patient chooses bed at [facility] )     Physician recommends and patient chooses bed at      Patient to be transferred to   on  .  Patient to be transferred to facility by       Patient family notified on   of transfer.  Name of family member notified:        PHYSICIAN       Additional Comment:    _______________________________________________ Labrea Eccleston, Darleen CrockerBailey M,  LCSW 12/19/2017, 4:57 PM

## 2017-12-19 NOTE — Progress Notes (Addendum)
[facility] [place] [facility]   PATIENT NAME: Kristina Archer    MR#:  [id number]  DATE OF BIRTH:  [birth year]  SUBJECTIVE:  CHIEF COMPLAINT: Reporting improving shortness of breath  Feeling weak and tired  could not follow-up with urology as scheduled because she was weak.  No diarrhea and no bowel movements in the past 24 hours  REVIEW OF SYSTEMS:  CONSTITUTIONAL: No fever, fatigue or weakness.  EYES: No blurred or double vision.  EARS, NOSE, AND THROAT: No tinnitus or ear pain.  RESPIRATORY: No cough, shortness of breath, wheezing or hemoptysis.  CARDIOVASCULAR: No chest pain, orthopnea, edema.  GASTROINTESTINAL: No nausea, vomiting, diarrhea or abdominal pain.  GENITOURINARY: No dysuria, hematuria.  ENDOCRINE: No polyuria, nocturia,  HEMATOLOGY: No anemia, easy bruising or bleeding SKIN: No rash or lesion. MUSCULOSKELETAL: No joint pain or arthritis.   NEUROLOGIC: No tingling, numbness, weakness.  PSYCHIATRY: No anxiety or depression.   DRUG ALLERGIES:   Allergies  Allergen Reactions  . Codeine Nausea Only    VITALS:  Blood pressure 117/64, pulse 73, temperature 98.3 F (36.8 C), temperature source Oral, resp. rate 18, height 5\' 2"  (1.575 m), weight 85.2 kg, SpO2 97 %.  PHYSICAL EXAMINATION:  GENERAL:  72 y.o.-year-old patient lying in the bed with no acute distress.  EYES: Pupils equal, round, reactive to light and accommodation. No scleral icterus. Extraocular muscles intact.  HEENT: Head atraumatic, normocephalic. Oropharynx and nasopharynx clear.  NECK:  Supple, no jugular venous distention. No thyroid enlargement, no tenderness.  LUNGS: Normal breath sounds bilaterally, no wheezing, rales,rhonchi or crepitation. No use of accessory muscles of respiration.  CARDIOVASCULAR: S1, S2 normal. No murmurs, rubs, or gallops.  ABDOMEN: Soft, nontender, nondistended. Bowel sounds present. No organomegaly or mass.  EXTREMITIES: No pedal edema,  cyanosis, or clubbing.  NEUROLOGIC: Cranial nerves II through XII are intact. Muscle strength 5/5 in all extremities. Sensation intact. Gait not checked.  PSYCHIATRIC: The patient is alert and oriented x 3.  SKIN: No obvious rash, lesion, or ulcer.    LABORATORY PANEL:   CBC Recent Labs  Lab 12/18/17 0224  WBC 11.6*  HGB 7.8*  HCT 24.1*  PLT 136*   ------------------------------------------------------------------------------------------------------------------  Chemistries  Recent Labs  Lab 12/19/17 0442  NA 137  K 4.2  CL 110  CO2 25  GLUCOSE 151*  BUN 21  CREATININE 1.47*  CALCIUM 8.8*  MG 2.5*  AST 21  ALT 13  ALKPHOS 60  BILITOT 0.8   ------------------------------------------------------------------------------------------------------------------  Cardiac Enzymes No results for input(s): TROPONINI in the last 168 hours. ------------------------------------------------------------------------------------------------------------------  RADIOLOGY:  Dg Chest 2 View  Result Date: 12/19/2017 CLINICAL DATA:  Pneumonia EXAM: CHEST - 2 VIEW COMPARISON:  Portable chest x-ray of December 17, 2017 FINDINGS: The lungs are better inflated today. Patchy density in the left lower lobe is less conspicuous but has not cleared. The cardiac silhouette remains enlarged. The pulmonary vascularity is not engorged. There is calcification in the wall of the aortic arch. The bony thorax exhibits no acute abnormality. IMPRESSION: Improving left lower lobe pneumonia. A follow-up chest x-ray in 2-3 weeks is recommended to assure complete clearing. Enlargement of the cardiac silhouette without pulmonary vascular congestion or pulmonary edema. Electronically Signed   By: David  SwazilandJordan M.D.   On: 12/19/2017 13:26   Dg Chest Portable 1 View  Result Date: 12/17/2017 CLINICAL DATA:  Hypoxia, vomiting, weakness. EXAM: PORTABLE CHEST 1 VIEW COMPARISON:  11/07/2017. FINDINGS: Enlarged cardiac  silhouette with an  interval decrease in size. Interval mild patchy opacity at the left lung base and possible small left pleural effusion. Clear right lung. Normal vascularity. Diffuse osteopenia. IMPRESSION: 1. Interval mild patchy atelectasis or pneumonia at the left lung base and possible small left pleural effusion. 2. Improved cardiomegaly. Electronically Signed   By: Beckie Salts M.D.   On: 12/17/2017 21:38   Ct Renal Stone Study  Result Date: 12/17/2017 CLINICAL DATA:  Nausea, vomiting and diarrhea that began today. Back pain. Known ureteral calculus on the right with a stent. EXAM: CT ABDOMEN AND PELVIS WITHOUT CONTRAST TECHNIQUE: Multidetector CT imaging of the abdomen and pelvis was performed following the standard protocol without IV contrast. COMPARISON:  Abdomen pelvis radiographs dated 11/03/2017 and abdomen and pelvis CT dated 11/02/2017. FINDINGS: Lower chest: Interval moderate dependent patchy opacity in the left lower lobe and mild dependent patchy opacity in the right lower lobe. Interval mild enlargement of the heart with left atrial and left ventricular enlargement. No pleural fluid seen. Hepatobiliary: No focal liver abnormality is seen. Status post cholecystectomy. No biliary dilatation. Pancreas: Unremarkable. No pancreatic ductal dilatation or surrounding inflammatory changes. Spleen: Enlarged, measuring 14.9 cm in length. Adrenals/Urinary Tract: 2 adjacent calculi in the lower pole of the right kidney, 1 measuring 6 mm in the other measuring 8 mm. 7 mm calculus in the proximal to mid right ureter at the level of the L4 vertebral body. A right renal stent remains partially and coiled in the right renal collecting system. Moderate dilatation of the right renal collecting system and proximal ureter. 5 mm lower pole left renal calculus. Otherwise, normal appearing left kidney and left ureter without hydronephrosis. Bilateral pelvic phleboliths. Normal appearing adrenal glands.  Stomach/Bowel: Multiple colonic diverticula without evidence of diverticulitis. Normal appearing appendix, small bowel and stomach. Vascular/Lymphatic: Atheromatous arterial calcifications without aneurysm. No enlarged lymph nodes. Reproductive: Status post hysterectomy. No adnexal masses. The previously demonstrated right adnexal cyst is no longer seen. Other: No abdominal wall hernia or abnormality. No abdominopelvic ascites. Musculoskeletal: Lumbar and lower thoracic spine degenerative changes. IMPRESSION: 1. Moderate right hydronephrosis and proximal hydroureter. 2. 7 mm proximal to mid right ureteral calculus at the level of the L4 vertebral body. 3. Bilateral nonobstructing renal calculi. 4. Interval moderate dependent atelectasis and possible pneumonia in the left lower lobe and mild dependent atelectasis and possible pneumonia in the right lower lobe. 5. Mild cardiomegaly with left atrial and left ventricular enlargement. 6. Stable mild splenomegaly. 7. Colonic diverticulosis. Electronically Signed   By: Beckie Salts M.D.   On: 12/17/2017 22:57    EKG:   Orders placed or performed during the hospital encounter of 11/02/17  . ED EKG  . ED EKG  . EKG 12-Lead  . EKG 12-Lead  . EKG 12-Lead  . EKG 12-Lead  . EKG 12-Lead  . EKG 12-Lead  . EKG 12-Lead  . EKG 12-Lead  . EKG 12-Lead  . EKG 12-Lead  . EKG 12-Lead  . EKG 12-Lead  . EKG 12-Lead  . EKG 12-Lead  . EKG 12-Lead  . EKG 12-Lead    ASSESSMENT AND PLAN:    1.  Sepsis, likely secondary to pneumonia and UTI.   On broad-spectrum IV antibiotics and IV fluids.   Continue supportive measures with oxygen as needed pain control.   Repeat chest x-ray on 12/19/2017 with improving left lower lobe pneumonia   Will check stool for C. Difficile infection as she has a watery bowel movement, patient denies any 2.  HCAP,  broad-spectrum IV antibiotics, oxygen and nebulizer. Repeat chest x-ray today with improving left lower lobe pneumonia 3.   UTI, secondary to kidney stone.  Patient is status post recent right ureteral stent placement.   Seen by urology reports that patient missed her follow-up appointment Recommending outpatient follow-up with urology after discharge regarding the kidney stone  Urine culture with greater than 100,000 colonies of E. coli, given the history of ESBL in the past continue meropenem for now 4.  Diabetes type 2.  Will monitor blood sugars before meals and at bedtime and use insulin treatment during the hospital stay. 5. ARF/CKD3, likely prerenal. IV fluids and monitor kidney function closely.  Avoid nephrotoxic medications. Creatinine 1.48-1.47, baseline creatinine seems to be at 1.1 Check a.m. labs 6.  Hypokalemia, likely secondary to diarrhea.     potassium at 3.1 Replete and recheck.  Magnesium at 2.1 7.  History of cardiomyopathy recent echocardiogram with 20 to 25% ejection fraction in October 2019 on Eliquis continue outpatient follow-up with cardiology  8.  Generalized weakness PT assessment- snf and walker with 5 inch wheels.  Offered home health with physical therapy but patient is refusing as she lives alone, very uncomfortable with home health with physical therapy and agreeable with skilled nursing care facility  9.  Insomnia patient is requesting Ambien will start Ambien nightly    All the records are reviewed and case discussed with Care Management/Social Workerr. Management plans discussed with the patient, family and they are in agreement.  CODE STATUS: dnr   TOTAL TIME TAKING CARE OF THIS PATIENT: 36  minutes.   POSSIBLE D/C IN 2  DAYS, DEPENDING ON CLINICAL CONDITION.  Note: This dictation was prepared with Dragon dictation along with smaller phrase technology. Any transcriptional errors that result from this process are unintentional.   Ramonita Lab M.D on 12/19/2017 at 4:13 PM  Between 7am to 6pm - Pager - [phone] After 6pm go to [email or web address] - password [email or web address]  [facility]  [facility]  Office  [phone]  CC: Primary care physician; Corky Downs, MD

## 2017-12-19 NOTE — Progress Notes (Signed)
Pharmacy Electrolyte Monitoring Consult:  Pharmacy consulted to assist in monitoring and replacing electrolytes in this 72 y.o. female admitted on 12/17/2017 with Vomiting and Diarrhea   Labs:  Sodium (mmol/L)  Date Value  12/19/2017 137  10/11/2011 143   Potassium (mmol/L)  Date Value  12/19/2017 4.2  10/11/2011 3.4 (L)   Magnesium (mg/dL)  Date Value  16/10/960412/10/2017 2.5 (H)   Phosphorus (mg/dL)  Date Value  54/09/811911/01/2017 2.8   Calcium (mg/dL)  Date Value  14/78/295612/10/2017 8.8 (L)   Calcium, Total (mg/dL)  Date Value  21/30/865710/01/2011 10.0   Albumin (g/dL)  Date Value  84/69/629512/10/2017 2.4 (L)  10/11/2011 4.3    Assessment/Plan: 12/10 K - 4.2 and WNL Mg - 2.5 will monitor as it is slightly elevated  No replacement needed.  Will recheck with morning labs.  Kristina Archer, PharmD Clinical Pharmacist 12/19/2017

## 2017-12-19 NOTE — Evaluation (Signed)
Physical Therapy Evaluation Patient Details Name: Kristina MooreBrenda A Archer MRN: [id number] DOB: March 03, 1945 Today's Date: 12/19/2017   History of Present Illness  Pt is a 72 y/o F who presented with nausea, diarrhea, fever. Pt has h/o recent R uretal stent placement for infected proximal uretreral stone. Pt found to have UTI. CT showed hydronephrosis.  Pt's PMH includes sciatica, LBBB, cardiomyopathy.      Clinical Impression  Pt admitted with above diagnosis. Pt currently with functional limitations due to the deficits listed below (see PT Problem List). Kristina Archer appears to not be at her baseline level of mobility.  Typically, pt ambulates in her home without AD and is independent with ADLs.  She currently requires min guard for bed mobility, transfers, and is limited to short distance ambulation due to fatigue.  SpO2 remains at or above 93% on RA throughout session without signs of SOB.  Given pt's current mobility status, recommending SNF at d/c.  Pt will benefit from skilled PT to increase their independence and safety with mobility to allow discharge to the venue listed below.      Follow Up Recommendations SNF    Equipment Recommendations  Rolling walker with 5" wheels(pt's RW is old and borrowed)    Recommendations for Other Services       Precautions / Restrictions Precautions Precautions: Fall;Other (comment) Precaution Comments: Monitor O2 Restrictions Weight Bearing Restrictions: No      Mobility  Bed Mobility Overal bed mobility: Needs Assistance Bed Mobility: Rolling;Sidelying to Sit Rolling: Min guard Sidelying to sit: Min guard;HOB elevated       General bed mobility comments: Cues for log roll techinque and pt uses bed rail to assist in pushing up from sidelying.  Increased time and effort.   Transfers Overall transfer level: Needs assistance Equipment used: Rolling walker (2 wheeled) Transfers: Sit to/from Stand Sit to Stand: Min guard         General transfer  comment: Pt is slow to stand.  Proper hand placement and safe technique demonstrated.    Ambulation/Gait Ambulation/Gait assistance: Min guard Gait Distance (Feet): 30 Feet Assistive device: Rolling walker (2 wheeled) Gait Pattern/deviations: Decreased step length - right;Decreased step length - left Gait velocity: decreased Gait velocity interpretation: <1.31 ft/sec, indicative of household ambulator General Gait Details: Dec gait speed with guarded posture.  Pt fatigues with ambulation to bathroom and back.    Stairs            Wheelchair Mobility    Modified Rankin (Stroke Patients Only)       Balance Overall balance assessment: Needs assistance Sitting-balance support: No upper extremity supported;Feet supported Sitting balance-Leahy Scale: Good     Standing balance support: No upper extremity supported;During functional activity Standing balance-Leahy Scale: Fair Standing balance comment: Pt able to stand statically without UE support but relies on RW to ambulate                             Pertinent Vitals/Pain Pain Assessment: Faces Faces Pain Scale: Hurts little more Pain Location: back Pain Descriptors / Indicators: Discomfort Pain Intervention(s): Limited activity within patient's tolerance;Monitored during session;Repositioned    Home Living Family/patient expects to be discharged to:: Private residence Living Arrangements: Alone Available Help at Discharge: Friend(s);Available PRN/intermittently Type of Home: Apartment Home Access: Elevator     Home Layout: One level Home Equipment: Walker - 2 wheels;Grab bars - toilet(reports RW is old and she is borrowing from someone)  Prior Function Level of Independence: Needs assistance   Gait / Transfers Assistance Needed: Pt ambulates without AD in apartment but uses RW outside.  No falls in the past 6 months.   ADL's / Homemaking Assistance Needed: Pt taking sponge bath ind, ind with  dressing.  Is able to cook simple meals.  Friends do the driving and grocery shopping.          Hand Dominance        Extremity/Trunk Assessment   Upper Extremity Assessment Upper Extremity Assessment: Generalized weakness    Lower Extremity Assessment Lower Extremity Assessment: Generalized weakness       Communication   Communication: No difficulties  Cognition Arousal/Alertness: Awake/alert Behavior During Therapy: WFL for tasks assessed/performed Overall Cognitive Status: Within Functional Limits for tasks assessed                                        General Comments General comments (skin integrity, edema, etc.): HR remains stable throughout session.  SpO2 remains at or above 93% on RA throughout session.  RN notified.     Exercises Other Exercises Other Exercises: Encouraged pt to ambulate in room at least 3x/day with nursing staff   Assessment/Plan    PT Assessment Patient needs continued PT services  PT Problem List Decreased strength;Decreased balance;Decreased activity tolerance;Decreased knowledge of use of DME;Decreased safety awareness;Cardiopulmonary status limiting activity;Pain       PT Treatment Interventions DME instruction;Gait training;Functional mobility training;Therapeutic activities;Therapeutic exercise;Balance training;Neuromuscular re-education;Patient/family education    PT Goals (Current goals can be found in the Care Plan section)  Acute Rehab PT Goals Patient Stated Goal: to improve mobility and be as independent as possible PT Goal Formulation: With patient Time For Goal Achievement: 01/02/18 Potential to Achieve Goals: Good    Frequency Min 2X/week   Barriers to discharge Decreased caregiver support Lives alone    Co-evaluation               AM-PAC PT "6 Clicks" Mobility  Outcome Measure Help needed turning from your back to your side while in a flat bed without using bedrails?: A Lot Help needed  moving from lying on your back to sitting on the side of a flat bed without using bedrails?: A Lot Help needed moving to and from a bed to a chair (including a wheelchair)?: A Little Help needed standing up from a chair using your arms (e.g., wheelchair or bedside chair)?: A Little Help needed to walk in hospital room?: A Little Help needed climbing 3-5 steps with a railing? : A Lot 6 Click Score: 15    End of Session Equipment Utilized During Treatment: Gait belt;Oxygen Activity Tolerance: Patient tolerated treatment well Patient left: in chair;with call bell/phone within reach;with chair alarm set Nurse Communication: Mobility status;Other (comment)(SpO2) PT Visit Diagnosis: Muscle weakness (generalized) (M62.81);Unsteadiness on feet (R26.81)    Time: 1610-9604 PT Time Calculation (min) (ACUTE ONLY): 38 min   Charges:   PT Evaluation $PT Eval Moderate Complexity: 1 Mod PT Treatments $Gait Training: 8-22 mins $Therapeutic Activity: 8-22 mins        Encarnacion Chu PT, DPT 12/19/2017, 4:13 PM

## 2017-12-19 NOTE — NC FL2 (Signed)
[place] MEDICAID FL2 LEVEL OF CARE SCREENING TOOL     IDENTIFICATION  Patient Name: Kristina Archer Birthdate: 10/10/1945 Sex: female Admission Date (Current Location): 12/17/2017  [facility] and [place] Number:  [name] ([id number]) Facility and Address:  [facility], [address] [place], [place] [address]      Provider Number: [id number]  Attending Physician Name and Address:  Kristina Lab, MD  Relative Name and Phone Number:       Current Level of Care: Hospital Recommended Level of Care: Skilled Nursing Facility Prior Approval Number:    Date Approved/Denied:   PASRR Number: ([id number])  Discharge Plan: SNF    Current Diagnoses: Patient Active Problem List   Diagnosis Date Noted  . Sepsis (HCC) 12/18/2017  . CHF (congestive heart failure) (HCC)   . Right ureteral stone 11/02/2017    Orientation RESPIRATION BLADDER Height & Weight     Self, Time, Situation, Place  O2(2 Liters Oxygen. ) Continent Weight: 187 lb 13.3 oz (85.2 kg) Height:  5\' 2"  (157.5 cm)  BEHAVIORAL SYMPTOMS/MOOD NEUROLOGICAL BOWEL NUTRITION STATUS      Continent Diet(Diet: Heart Healthy/ Carb Modified. )  AMBULATORY STATUS COMMUNICATION OF NEEDS Skin   Extensive Assist Verbally Normal                       Personal Care Assistance Level of Assistance  Bathing, Feeding, Dressing Bathing Assistance: Limited assistance Feeding assistance: Independent Dressing Assistance: Limited assistance     Functional Limitations Info  Sight, Hearing, Speech Sight Info: Adequate Hearing Info: Adequate Speech Info: Adequate    SPECIAL CARE FACTORS FREQUENCY  PT (By licensed PT), OT (By licensed OT)     PT Frequency: (5) OT Frequency: (5)            Contractures      Additional Factors Info  Code Status, Allergies, Isolation Precautions Code Status Info: (DNR ) Allergies Info: (Codeine)     Isolation Precautions Info: (histroy of ESBL in urine. )    Current Medications (12/19/2017):  This is the current hospital active medication list Current Facility-Administered Medications  Medication Dose Route Frequency Provider Last Rate Last Dose  . 0.9 %  sodium chloride infusion   Intravenous Continuous Gouru, Aruna, MD 75 mL/hr at 12/19/17 1451    . acetaminophen (TYLENOL) tablet 650 mg  650 mg Oral Q6H PRN Kristina Copa, MD   650 mg at 12/19/17 6387   Or  . acetaminophen (TYLENOL) suppository 650 mg  650 mg Rectal Q6H PRN Kristina Copa, MD      . albuterol (PROVENTIL) (2.5 MG/3ML) 0.083% nebulizer solution 2.5 mg  2.5 mg Inhalation Q6H PRN Kristina Copa, MD      . apixaban Everlene Balls) tablet 5 mg  5 mg Oral BID Kristina Copa, MD   5 mg at 12/19/17 0926  . bisacodyl (DULCOLAX) EC tablet 5 mg  5 mg Oral Daily PRN Kristina Copa, MD      . carvedilol (COREG) tablet 3.125 mg  3.125 mg Oral BID WC Kristina Copa, MD   3.125 mg at 12/19/17 0757  . docusate sodium (COLACE) capsule 100 mg  100 mg Oral BID Kristina Copa, MD   100 mg at 12/19/17 5643  . feeding supplement (GLUCERNA SHAKE) (GLUCERNA SHAKE) liquid 237 mL  237 mL Oral BID BM Gouru, Aruna, MD   237 mL at 12/19/17 1411  . HYDROcodone-acetaminophen (NORCO/VICODIN) 5-325 MG per tablet 1-2 tablet  1-2 tablet Oral Q4H PRN  Kristina CopaMaier, Angela, MD   1 tablet at 12/18/17 [phone]  . insulin aspart (novoLOG) injection 0-15 Units  0-15 Units Subcutaneous TID WC Kristina CopaMaier, Angela, MD   3 Units at 12/19/17 1227  . insulin aspart (novoLOG) injection 0-5 Units  0-5 Units Subcutaneous QHS Kristina CopaMaier, Angela, MD      . insulin glargine (LANTUS) injection 12 Units  12 Units Subcutaneous QHS Kristina LabGouru, Aruna, MD   12 Units at 12/18/17 2100  . meropenem (MERREM) 1 g in sodium chloride 0.9 % 100 mL IVPB  1 g Intravenous Q12H Kristina CopaMaier, Angela, MD 200 mL/hr at 12/19/17 1412 1 g at 12/19/17 1412  . multivitamin with minerals tablet 1 tablet  1 tablet Oral Daily Kristina CopaMaier, Angela, MD   1 tablet at 12/19/17 0926  . ondansetron (ZOFRAN) tablet 4 mg   4 mg Oral Q6H PRN Kristina CopaMaier, Angela, MD       Or  . ondansetron [facility](ZOFRAN) injection 4 mg  4 mg Intravenous Q6H PRN Kristina CopaMaier, Angela, MD   4 mg at 12/18/17 0928  . pantoprazole (PROTONIX) EC tablet 40 mg  40 mg Oral Daily Kristina CopaMaier, Angela, MD   40 mg at 12/19/17 [id number]  . PARoxetine (PAXIL) tablet 20 mg  20 mg Oral QHS Kristina CopaMaier, Angela, MD   20 mg at 12/18/17 2100  . potassium chloride SA (K-DUR,KLOR-CON) CR tablet 40 mEq  40 mEq Oral BID Kristina CopaMaier, Angela, MD   40 mEq at 12/19/17 [id number]  . rosuvastatin (CRESTOR) tablet 40 mg  40 mg Oral Daily Kristina CopaMaier, Angela, MD   40 mg at 12/18/17 1656  . zolpidem (AMBIEN) tablet 5 mg  5 mg Oral QHS Kristina LabGouru, Aruna, MD         Discharge Medications: Please see discharge summary for a list of discharge medications.  Relevant Imaging Results:  Relevant Archer Results:   Additional Information (SSN: [id number])  Kristina Demo, Darleen CrockerBailey M, LCSW

## 2017-12-20 ENCOUNTER — Encounter: Payer: Self-pay | Admitting: *Deleted

## 2017-12-20 ENCOUNTER — Inpatient Hospital Stay: Payer: Self-pay

## 2017-12-20 DIAGNOSIS — J181 Lobar pneumonia, unspecified organism: Secondary | ICD-10-CM

## 2017-12-20 DIAGNOSIS — N390 Urinary tract infection, site not specified: Secondary | ICD-10-CM

## 2017-12-20 DIAGNOSIS — R319 Hematuria, unspecified: Secondary | ICD-10-CM

## 2017-12-20 DIAGNOSIS — J189 Pneumonia, unspecified organism: Secondary | ICD-10-CM

## 2017-12-20 LAB — BASIC METABOLIC PANEL
Anion gap: 5 (ref 5–15)
BUN: 18 mg/dL (ref 8–23)
CO2: 21 mmol/L — ABNORMAL LOW (ref 22–32)
Calcium: 9.3 mg/dL (ref 8.9–10.3)
Chloride: 109 mmol/L (ref 98–111)
Creatinine, Ser: 1.31 mg/dL — ABNORMAL HIGH (ref 0.44–1.00)
GFR calc Af Amer: 48 mL/min — ABNORMAL LOW (ref >60)
GFR calc non Af Amer: 41 mL/min — ABNORMAL LOW (ref >60)
Glucose, Bld: 129 mg/dL — ABNORMAL HIGH (ref 70–99)
Potassium: 4.9 mmol/L (ref 3.5–5.1)
SODIUM: 135 mmol/L (ref 135–145)

## 2017-12-20 LAB — GLUCOSE, CAPILLARY
Glucose-Capillary: 98 mg/dL (ref 70–99)
Glucose-Capillary: 155 mg/dL — ABNORMAL HIGH (ref 70–99)
Glucose-Capillary: 162 mg/dL — ABNORMAL HIGH (ref 70–99)
Glucose-Capillary: 190 mg/dL — ABNORMAL HIGH (ref 70–99)

## 2017-12-20 LAB — CBC WITH DIFFERENTIAL/PLATELET
Abs Immature Granulocytes: 0.11 10*3/uL — ABNORMAL HIGH (ref 0.00–0.07)
Basophils Absolute: 0.1 10*3/uL (ref 0.0–0.1)
Basophils Relative: 1 %
Eosinophils Absolute: 0.1 10*3/uL (ref 0.0–0.5)
Eosinophils Relative: 1 %
HCT: 27.3 % — ABNORMAL LOW (ref 36.0–46.0)
HEMOGLOBIN: 8.7 g/dL — AB (ref 12.0–15.0)
Immature Granulocytes: 1 %
LYMPHS PCT: 19 %
Lymphs Abs: 1.9 10*3/uL (ref 0.7–4.0)
MCH: 27.1 pg (ref 26.0–34.0)
MCHC: 31.9 g/dL (ref 30.0–36.0)
MCV: 85 fL (ref 80.0–100.0)
Monocytes Absolute: 0.5 10*3/uL (ref 0.1–1.0)
Monocytes Relative: 6 %
Neutro Abs: 6.9 10*3/uL (ref 1.7–7.7)
Neutrophils Relative %: 72 %
Platelets: 181 10*3/uL (ref 150–400)
RBC: 3.21 MIL/uL — ABNORMAL LOW (ref 3.87–5.11)
RDW: 15.5 % (ref 11.5–15.5)
WBC: 9.6 10*3/uL (ref 4.0–10.5)
nRBC: 0 % (ref 0.0–0.2)

## 2017-12-20 LAB — PROCALCITONIN
Procalcitonin: 15.11 ng/mL
Procalcitonin: 23.18 ng/mL

## 2017-12-20 LAB — URINE CULTURE: Culture: >=100000 — AB

## 2017-12-20 LAB — LACTIC ACID, PLASMA
Lactic Acid, Venous: 0.9 mmol/L (ref 0.5–1.9)
Lactic Acid, Venous: 0.9 mmol/L (ref 0.5–1.9)

## 2017-12-20 LAB — MAGNESIUM: Magnesium: 2.2 mg/dL (ref 1.7–2.4)

## 2017-12-20 MED ORDER — FUROSEMIDE 10 MG/ML IJ SOLN
20.0000 mg | Freq: Every day | INTRAMUSCULAR | Status: DC
  Administered 2017-12-20: 20 mg via INTRAVENOUS
  Filled 2017-12-20: qty 4

## 2017-12-20 MED ORDER — FUROSEMIDE 20 MG PO TABS
20.0000 mg | ORAL_TABLET | Freq: Every day | ORAL | Status: DC
  Administered 2017-12-21 – 2017-12-22 (×2): 20 mg via ORAL
  Filled 2017-12-20 (×2): qty 1

## 2017-12-20 NOTE — Progress Notes (Signed)
Pharmacy Electrolyte Monitoring Consult:  Pharmacy consulted to assist in monitoring and replacing electrolytes in this 72 y.o. female admitted on 12/17/2017 with Vomiting and Diarrhea   Labs:  Sodium (mmol/L)  Date Value  12/20/2017 135  10/11/2011 143   Potassium (mmol/L)  Date Value  12/20/2017 4.9  10/11/2011 3.4 (L)   Magnesium (mg/dL)  Date Value  86/57/846912/11/2017 2.2   Phosphorus (mg/dL)  Date Value  62/95/284111/01/2017 2.8   Calcium (mg/dL)  Date Value  32/44/010212/11/2017 9.3   Calcium, Total (mg/dL)  Date Value  72/53/664410/01/2011 10.0   Albumin (g/dL)  Date Value  03/47/425912/10/2017 2.4 (L)  10/11/2011 4.3    Assessment/Plan: 12/10 K - 4.9 Mg - 2.2  No replacement needed.  Will recheck BMP with morning labs.  Abbie Sonshris Delani Kohli, PharmD Clinical Pharmacist 12/20/2017

## 2017-12-20 NOTE — Progress Notes (Signed)
[facility] [employer] [facility] is still pending.   Baker Hughes IncorporatedBailey Karyna Bessler, LCSW [phone]

## 2017-12-20 NOTE — Progress Notes (Signed)
Patients son took patients home medications with him

## 2017-12-20 NOTE — Consult Note (Signed)
Infectious Disease     Reason for Consult: ESBL UTI  Referring Physician:Gouru, A Date of Admission:  12/17/2017   Active Problems:   Sepsis ([place])   HPI: Kristina Archer is a 72 y.o. female with a history of CKD, cardiomyopathy, hypertension, type 2 diabetes admitted with NV and diarrhea as well as R flank pain.  On admit she was febrile, hypotensive and lethargic with an elevated lactic acid. UA + and UCX with ESBL E coli. Also noted LLL Pna and R hydronephrosis.  On 10/24 she had R ureteral stent placement for obstructive nephropathy and infection. Cultures at that time grew ESBL E coli and she was dced on 11/4 on macrobid for 7 days. I do not see any evidence of followup with outpatient urology.  Seen here 12/9 with plan for 10-14 days abx, followed by ppx treatment with macrobid until definitive management of her stones. She has defervesced, no further nausea or vomiting. FU CXR with some improvement.  Past Medical History:  Diagnosis Date  . Anxiety   . Cardiomyopathy ([place])    a. 10/2017 Echo: EF 20-25%, diff HK, mild MR. Nl RV size.  . CHF (congestive heart failure) ([place])   . Coronary artery disease   . DDD (degenerative disc disease), lumbar   . Diverticulosis   . GERD (gastroesophageal reflux disease)   . High blood pressure   . High cholesterol   . History of kidney stones   . LBBB (left bundle branch block)   . Myocardial infarction ([place])   . Nephrolithiasis   . Pneumonia   . Sciatica   . Type II diabetes mellitus ([place])    Past Surgical History:  Procedure Laterality Date  . ABDOMINAL HYSTERECTOMY    . CHOLECYSTECTOMY    . CYSTOSCOPY W/ RETROGRADES Bilateral 05/24/2017   Procedure: CYSTOSCOPY WITH RETROGRADE PYELOGRAM;  Surgeon: Hollice Espy, MD;  Location: [facility] ORS;  Service: Urology;  Laterality: Bilateral;  . CYSTOSCOPY W/ URETERAL STENT PLACEMENT Right 11/02/2017   Procedure: CYSTOSCOPY WITH RETROGRADE PYELOGRAM/URETERAL STENT PLACEMENT;  Surgeon: Lucas Mallow, MD;  Location: [facility] ORS;  Service: Urology;  Laterality: Right;  . CYSTOSCOPY/URETEROSCOPY/HOLMIUM LASER/STENT PLACEMENT Left 05/24/2017   Procedure: CYSTOSCOPY/URETEROSCOPY/HOLMIUM LASER/STENT PLACEMENT;  Surgeon: Hollice Espy, MD;  Location: [facility] ORS;  Service: Urology;  Laterality: Left;   Social History   Tobacco Use  . Smoking status: Former Smoker    Packs/day: 1.50    Years: 15.00    Pack years: 22.50    Types: Cigarettes    Last attempt to quit: 05/12/1997    Years since quitting: 20.6  . Smokeless tobacco: Never Used  Substance Use Topics  . Alcohol use: Not Currently  . Drug use: Never   Family History  Problem Relation Age of Onset  . Heart attack Father 24  . Bladder Cancer Neg Hx   . Kidney cancer Neg Hx     Allergies:  Allergies  Allergen Reactions  . Codeine Nausea Only    Current antibiotics: Antibiotics Given (last 72 hours)    Date/Time Action Medication Dose Rate   12/17/17 2218 New Bag/Given   cefTRIAXone (ROCEPHIN) 2 g in sodium chloride 0.9 % 100 mL IVPB 2 g 200 mL/hr   12/17/17 2251 New Bag/Given   azithromycin (ZITHROMAX) 500 mg in sodium chloride 0.9 % 250 mL IVPB 500 mg 250 mL/hr   12/18/17 0021 New Bag/Given   ceFEPIme (MAXIPIME) 2 g in sodium chloride 0.9 % 100 mL IVPB 2 g 200  mL/hr   12/18/17 0059 New Bag/Given   vancomycin (VANCOCIN) IVPB 1000 mg/200 mL premix 1,000 mg 200 mL/hr   12/18/17 0305 New Bag/Given   meropenem (MERREM) 1 g in sodium chloride 0.9 % 100 mL IVPB 1 g 200 mL/hr   12/18/17 0640 Given   vancomycin (VANCOCIN) 50 mg/mL oral solution 125 mg 125 mg    12/18/17 1225 Given   vancomycin (VANCOCIN) 50 mg/mL oral solution 125 mg 125 mg    12/18/17 1358 New Bag/Given   meropenem (MERREM) 1 g in sodium chloride 0.9 % 100 mL IVPB 1 g 200 mL/hr   12/19/17 0310 New Bag/Given   meropenem (MERREM) 1 g in sodium chloride 0.9 % 100 mL IVPB 1 g 200 mL/hr   12/19/17 1412 New Bag/Given   meropenem (MERREM) 1 g in sodium chloride  0.9 % 100 mL IVPB 1 g 200 mL/hr   12/20/17 0124 New Bag/Given   meropenem (MERREM) 1 g in sodium chloride 0.9 % 100 mL IVPB 1 g 200 mL/hr   12/20/17 1509 New Bag/Given   meropenem (MERREM) 1 g in sodium chloride 0.9 % 100 mL IVPB 1 g 200 mL/hr      MEDICATIONS: . apixaban  5 mg Oral BID  . carvedilol  3.125 mg Oral BID WC  . docusate sodium  100 mg Oral BID  . feeding supplement (GLUCERNA SHAKE)  237 mL Oral BID BM  . [START ON 12/21/2017] furosemide  20 mg Oral Daily  . insulin aspart  0-15 Units Subcutaneous TID WC  . insulin aspart  0-5 Units Subcutaneous QHS  . insulin glargine  12 Units Subcutaneous QHS  . multivitamin with minerals  1 tablet Oral Daily  . pantoprazole  40 mg Oral Daily  . PARoxetine  20 mg Oral QHS  . rosuvastatin  40 mg Oral Daily  . zolpidem  5 mg Oral QHS    Review of Systems - 11 systems reviewed and negative per HPI   OBJECTIVE: Temp:  [98.2 F (36.8 C)-99.1 F (37.3 C)] 98.2 F (36.8 C) (12/11 1622) Pulse Rate:  [70-86] 74 (12/11 1622) Resp:  [16-20] 18 (12/11 1622) BP: (122-141)/(58-71) 124/61 (12/11 1622) SpO2:  [89 %-98 %] 91 % (12/11 1622) Physical Exam  Constitutional:  oriented to person, place, and time. appears well-developed and well-nourished. No distress.  HENT: /AT, PERRLA, no scleral icterus Mouth/Throat: Oropharynx is clear and moist. No oropharyngeal exudate.  Cardiovascular: Normal rate, regular rhythm  Pulmonary/Chest: bibasilar rhonchi Neck = supple, no nuchal rigidity Abdominal: Soft. Bowel sounds are normal.  exhibits no distension. There is no tenderness.  Lymphadenopathy: no cervical adenopathy. No axillary adenopathy Neurological: alert and oriented to person, place, and time.  Skin: Skin is warm and dry. No rash noted. No erythema.  Psychiatric: a normal mood and affect.  behavior is normal.    LABS: Results for orders placed or performed during the hospital encounter of 12/17/17 (from the past 48 hour(s))   Glucose, capillary     Status: Abnormal   Collection Time: 12/18/17  4:35 PM  Result Value Ref Range   Glucose-Capillary 195 (H) 70 - 99 mg/dL  Potassium     Status: None   Collection Time: 12/18/17  6:16 PM  Result Value Ref Range   Potassium 3.5 3.5 - 5.1 mmol/L    Comment: Performed at [facility], [address] [place],  [address]  Glucose, capillary     Status: Abnormal   Collection Time: 12/18/17  8:58 PM  Result Value Ref Range   Glucose-Capillary 175 (H) 70 - 99 mg/dL  Comprehensive metabolic panel     Status: Abnormal   Collection Time: 12/19/17  4:42 AM  Result Value Ref Range   Sodium 137 135 - 145 mmol/L    Comment: ELECTROLYTES REPEATED SDR   Potassium 4.2 3.5 - 5.1 mmol/L   Chloride 110 98 - 111 mmol/L   CO2 25 22 - 32 mmol/L   Glucose, Bld 151 (H) 70 - 99 mg/dL   BUN 21 8 - 23 mg/dL   Creatinine, Ser 1.47 (H) 0.44 - 1.00 mg/dL   Calcium 8.8 (L) 8.9 - 10.3 mg/dL   Total Protein 5.6 (L) 6.5 - 8.1 g/dL   Albumin 2.4 (L) 3.5 - 5.0 g/dL   AST 21 15 - 41 U/L   ALT 13 0 - 44 U/L   Alkaline Phosphatase 60 38 - 126 U/L   Total Bilirubin 0.8 0.3 - 1.2 mg/dL   GFR calc non Af Amer 36 (L) >60 mL/min   GFR calc Af Amer 42 (L) >60 mL/min   Anion gap 2 (L) 5 - 15    Comment: Performed at [facility], [address], [place], [place] [address]  Magnesium     Status: Abnormal   Collection Time: 12/19/17  4:42 AM  Result Value Ref Range   Magnesium 2.5 (H) 1.7 - 2.4 mg/dL    Comment: Performed at [facility], [address] [place], [place] [address]  Glucose, capillary     Status: Abnormal   Collection Time: 12/19/17  7:39 AM  Result Value Ref Range   Glucose-Capillary 164 (H) 70 - 99 mg/dL  Glucose, capillary     Status: Abnormal   Collection Time: 12/19/17 12:16 PM  Result Value Ref Range   Glucose-Capillary 153 (H) 70 - 99 mg/dL  Glucose, capillary     Status: Abnormal   Collection Time: 12/19/17  4:45 PM  Result Value  Ref Range   Glucose-Capillary 158 (H) 70 - 99 mg/dL  Glucose, capillary     Status: Abnormal   Collection Time: 12/19/17  9:17 PM  Result Value Ref Range   Glucose-Capillary 125 (H) 70 - 99 mg/dL  Lactic acid, plasma     Status: None   Collection Time: 12/20/17 12:17 AM  Result Value Ref Range   Lactic Acid, Venous 0.9 0.5 - 1.9 mmol/L    Comment: Performed at [facility], [address] [place], [place] [address]  Procalcitonin - Baseline     Status: None   Collection Time: 12/20/17 12:17 AM  Result Value Ref Range   Procalcitonin 23.18 ng/mL    Comment:        Interpretation: PCT >= 10 ng/mL: Important systemic inflammatory response, almost exclusively due to severe bacterial sepsis or septic shock. (NOTE)       Sepsis PCT Algorithm           Lower Respiratory Tract                                      Infection PCT Algorithm    ----------------------------     ----------------------------         PCT < 0.25 ng/mL                PCT < 0.10 ng/mL  Strongly encourage             Strongly discourage   discontinuation of antibiotics    initiation of antibiotics    ----------------------------     -----------------------------       PCT 0.25 - 0.50 ng/mL            PCT 0.10 - 0.25 ng/mL               OR       >80% decrease in PCT            Discourage initiation of                                            antibiotics      Encourage discontinuation           of antibiotics    ----------------------------     -----------------------------         PCT >= 0.50 ng/mL              PCT 0.26 - 0.50 ng/mL                AND       <80% decrease in PCT             Encourage initiation of                                             antibiotics       Encourage continuation           of antibiotics    ----------------------------     -----------------------------        PCT >= 0.50 ng/mL                  PCT > 0.50 ng/mL               AND         increase in PCT                   Strongly encourage                                      initiation of antibiotics    Strongly encourage escalation           of antibiotics                                     -----------------------------                                           PCT <= 0.25 ng/mL                                                 OR                                        >  80% decrease in PCT                                     Discontinue / Do not initiate                                             antibiotics Performed at [facility], [address] [place], [place] [address]   CBC with Differential/Platelet     Status: Abnormal   Collection Time: 12/20/17 12:17 AM  Result Value Ref Range   WBC 9.6 4.0 - 10.5 K/uL   RBC 3.21 (L) 3.87 - 5.11 MIL/uL   Hemoglobin 8.7 (L) 12.0 - 15.0 g/dL   HCT 27.3 (L) 36.0 - 46.0 %   MCV 85.0 80.0 - 100.0 fL   MCH 27.1 26.0 - 34.0 pg   MCHC 31.9 30.0 - 36.0 g/dL   RDW 15.5 11.5 - 15.5 %   Platelets 181 150 - 400 K/uL   nRBC 0.0 0.0 - 0.2 %   Neutrophils Relative % 72 %   Neutro Abs 6.9 1.7 - 7.7 K/uL   Lymphocytes Relative 19 %   Lymphs Abs 1.9 0.7 - 4.0 K/uL   Monocytes Relative 6 %   Monocytes Absolute 0.5 0.1 - 1.0 K/uL   Eosinophils Relative 1 %   Eosinophils Absolute 0.1 0.0 - 0.5 K/uL   Basophils Relative 1 %   Basophils Absolute 0.1 0.0 - 0.1 K/uL   Immature Granulocytes 1 %   Abs Immature Granulocytes 0.11 (H) 0.00 - 0.07 K/uL    Comment: Performed at [facility], [address] [place], [place] [address]  Basic metabolic panel     Status: Abnormal   Collection Time: 12/20/17 12:17 AM  Result Value Ref Range   Sodium 135 135 - 145 mmol/L   Potassium 4.9 3.5 - 5.1 mmol/L   Chloride 109 98 - 111 mmol/L   CO2 21 (L) 22 - 32 mmol/L   Glucose, Bld 129 (H) 70 - 99 mg/dL   BUN 18 8 - 23 mg/dL   Creatinine, Ser 1.31 (H) 0.44 - 1.00 mg/dL   Calcium 9.3 8.9 - 10.3 mg/dL   GFR calc non Af Amer 41 (L) >60 mL/min   GFR  calc Af Amer 48 (L) >60 mL/min   Anion gap 5 5 - 15    Comment: Performed at [facility], [address] [place], [place] [address]  Magnesium     Status: None   Collection Time: 12/20/17 12:17 AM  Result Value Ref Range   Magnesium 2.2 1.7 - 2.4 mg/dL    Comment: Performed at [facility], [address] [place], [place] [address]  Lactic acid, plasma     Status: None   Collection Time: 12/20/17  3:02 AM  Result Value Ref Range   Lactic Acid, Venous 0.9 0.5 - 1.9 mmol/L    Comment: Performed at [facility], [address] [place], [place] [address]  Glucose, capillary     Status: None   Collection Time: 12/20/17  8:36 AM  Result Value Ref Range   Glucose-Capillary 98 70 - 99 mg/dL  Glucose, capillary     Status: Abnormal   Collection Time: 12/20/17 12:23 PM  Result Value Ref Range  Glucose-Capillary 155 (H) 70 - 99 mg/dL   No components found for: ESR, C REACTIVE PROTEIN MICRO: Recent Results (from the past 720 hour(s))  Culture, blood (routine x 2)     Status: None (Preliminary result)   Collection Time: 12/17/17  8:57 PM  Result Value Ref Range Status   Specimen Description BLOOD RIGHT ANTECUBITAL  Final   Special Requests   Final    BOTTLES DRAWN AEROBIC AND ANAEROBIC Blood Culture results may not be optimal due to an excessive volume of blood received in culture bottles   Culture   Final    NO GROWTH 3 DAYS Performed at [facility], [address] [place], [place] [address]    Report Status PENDING  Incomplete  Culture, blood (routine x 2)     Status: None (Preliminary result)   Collection Time: 12/17/17  8:57 PM  Result Value Ref Range Status   Specimen Description BLOOD LEFT FOREARM  Final   Special Requests   Final    BOTTLES DRAWN AEROBIC AND ANAEROBIC Blood Culture results may not be optimal due to an excessive volume of blood received in culture bottles   Culture   Final    NO GROWTH 3 DAYS Performed at [facility], [address], [place], [place] [address]    Report Status PENDING  Incomplete  Urine culture     Status: Abnormal   Collection Time: 12/17/17  8:57 PM  Result Value Ref Range Status   Specimen Description   Final    URINE, RANDOM Performed at [facility], [address], [place], [place] [address]    Special Requests   Final    NONE Performed at [facility], [address] [place], [place] [address]    Culture (A)  Final    >=100,000 COLONIES/mL ESCHERICHIA COLI Confirmed Extended Spectrum Beta-Lactamase Producer (ESBL).  In bloodstream infections from ESBL organisms, carbapenems are preferred over piperacillin/tazobactam. They are shown to have a lower risk of mortality.    Report Status 12/20/2017 FINAL  Final   Organism ID, Bacteria ESCHERICHIA COLI (A)  Final      Susceptibility   Escherichia coli - MIC*    AMPICILLIN >=32 RESISTANT Resistant     CEFAZOLIN >=64 RESISTANT Resistant     CEFTRIAXONE >=64 RESISTANT Resistant     CIPROFLOXACIN >=4 RESISTANT Resistant     GENTAMICIN <=1 SENSITIVE Sensitive     IMIPENEM <=0.25 SENSITIVE Sensitive     NITROFURANTOIN <=16 SENSITIVE Sensitive     TRIMETH/SULFA >=320 RESISTANT Resistant     AMPICILLIN/SULBACTAM >=32 RESISTANT Resistant     PIP/TAZO 8 SENSITIVE Sensitive     Extended ESBL POSITIVE Resistant     * >=100,000 COLONIES/mL ESCHERICHIA COLI  MRSA PCR Screening     Status: None   Collection Time: 12/18/17  3:24 PM  Result Value Ref Range Status   MRSA by PCR NEGATIVE NEGATIVE Final    Comment:        The GeneXpert MRSA Assay (FDA approved for NASAL specimens only), is one component of a comprehensive MRSA colonization surveillance program. It is not intended to diagnose MRSA infection nor to guide or monitor treatment for MRSA infections. Performed at [facility], [address] [place], [place] [address]     IMAGING: Dg Chest 2 View  Result Date:  12/19/2017 CLINICAL DATA:  Pneumonia EXAM: CHEST - 2 VIEW COMPARISON:  Portable chest x-ray of December 17, 2017 FINDINGS: The lungs are  better inflated today. Patchy density in the left lower lobe is less conspicuous but has not cleared. The cardiac silhouette remains enlarged. The pulmonary vascularity is not engorged. There is calcification in the wall of the aortic arch. The bony thorax exhibits no acute abnormality. IMPRESSION: Improving left lower lobe pneumonia. A follow-up chest x-ray in 2-3 weeks is recommended to assure complete clearing. Enlargement of the cardiac silhouette without pulmonary vascular congestion or pulmonary edema. Electronically Signed   By: Omere Marti  [place] M.D.   On: 12/19/2017 13:26   Dg Chest Port 1 View  Result Date: 12/19/2017 CLINICAL DATA:  Dyspnea. EXAM: PORTABLE CHEST 1 VIEW COMPARISON:  12/19/2017. FINDINGS: Stable enlarged cardiac silhouette. Mild increase in prominence of the pulmonary vasculature and interstitial markings with some Kerley lines. Small bilateral pleural effusions. Diffuse osteopenia. IMPRESSION: Interval changes of acute congestive heart failure with stable cardiomegaly. Electronically Signed   By: Claudie Revering M.D.   On: 12/19/2017 23:40   Dg Chest Portable 1 View  Result Date: 12/17/2017 CLINICAL DATA:  Hypoxia, vomiting, weakness. EXAM: PORTABLE CHEST 1 VIEW COMPARISON:  11/07/2017. FINDINGS: Enlarged cardiac silhouette with an interval decrease in size. Interval mild patchy opacity at the left lung base and possible small left pleural effusion. Clear right lung. Normal vascularity. Diffuse osteopenia. IMPRESSION: 1. Interval mild patchy atelectasis or pneumonia at the left lung base and possible small left pleural effusion. 2. Improved cardiomegaly. Electronically Signed   By: Claudie Revering M.D.   On: 12/17/2017 21:38   Ct Renal Stone Study  Result Date: 12/17/2017 CLINICAL DATA:  Nausea, vomiting and diarrhea that began today. Back pain. Known  ureteral calculus on the right with a stent. EXAM: CT ABDOMEN AND PELVIS WITHOUT CONTRAST TECHNIQUE: Multidetector CT imaging of the abdomen and pelvis was performed following the standard protocol without IV contrast. COMPARISON:  Abdomen pelvis radiographs dated 11/03/2017 and abdomen and pelvis CT dated 11/02/2017. FINDINGS: Lower chest: Interval moderate dependent patchy opacity in the left lower lobe and mild dependent patchy opacity in the right lower lobe. Interval mild enlargement of the heart with left atrial and left ventricular enlargement. No pleural fluid seen. Hepatobiliary: No focal liver abnormality is seen. Status post cholecystectomy. No biliary dilatation. Pancreas: Unremarkable. No pancreatic ductal dilatation or surrounding inflammatory changes. Spleen: Enlarged, measuring 14.9 cm in length. Adrenals/Urinary Tract: 2 adjacent calculi in the lower pole of the right kidney, 1 measuring 6 mm in the other measuring 8 mm. 7 mm calculus in the proximal to mid right ureter at the level of the L4 vertebral body. A right renal stent remains partially and coiled in the right renal collecting system. Moderate dilatation of the right renal collecting system and proximal ureter. 5 mm lower pole left renal calculus. Otherwise, normal appearing left kidney and left ureter without hydronephrosis. Bilateral pelvic phleboliths. Normal appearing adrenal glands. Stomach/Bowel: Multiple colonic diverticula without evidence of diverticulitis. Normal appearing appendix, small bowel and stomach. Vascular/Lymphatic: Atheromatous arterial calcifications without aneurysm. No enlarged lymph nodes. Reproductive: Status post hysterectomy. No adnexal masses. The previously demonstrated right adnexal cyst is no longer seen. Other: No abdominal wall hernia or abnormality. No abdominopelvic ascites. Musculoskeletal: Lumbar and lower thoracic spine degenerative changes. IMPRESSION: 1. Moderate right hydronephrosis and proximal  hydroureter. 2. 7 mm proximal to mid right ureteral calculus at the level of the L4 vertebral body. 3. Bilateral nonobstructing renal calculi. 4. Interval moderate dependent atelectasis and possible pneumonia in the left lower lobe and mild dependent atelectasis and possible pneumonia in  the right lower lobe. 5. Mild cardiomegaly with left atrial and left ventricular enlargement. 6. Stable mild splenomegaly. 7. Colonic diverticulosis. Electronically Signed   By: Claudie Revering M.D.   On: 12/17/2017 22:57    Assessment:   Kristina Archer is a 72 y.o. female with multiple medical problems including long hx of nephrolithiasis and recurrent UTIs and recent admission 10/24-11/4 with obstructive hydronephrosis and ESBL E coli infection. Now readmitted with NV diarrhea, sepsis, and recurrent UTI as well as Pneumonia.  Clinically much improved with meropenem. Stent seems well placed per urology based on CT imaging. Will be sent to SNF for PT.  Recommendations Place PICC IV ertapenem 1 gm QD for 14 total day abx course- stop date 12/21 After that can remove picc and agree to start macrobid suppressive therapy 100 qd. Would try to expediate treatment of the stones and try to remove stents and stones ASAP as infection likely to have seeded these and will likely recur if definitive treatment not performed. No need for ID outpatient follow up. See Abx order sheet.  Thank you very much for allowing me to participate in the care of this patient. Please call with questions.   Cheral Marker. Ola Spurr, MD

## 2017-12-20 NOTE — Progress Notes (Signed)
[facility]. Kristina Archer is aware that patient may need IV ABX and has a private room for patient. Per MD patient may be ready for D/C tomorrow. Patient and her son Kristina Archer are aware of above.   Kristina Hughes IncorporatedBailey Jairy Angulo, LCSW [phone]

## 2017-12-20 NOTE — Progress Notes (Signed)
Infectious Disease Long Term IV Antibiotic Orders Kristina MooreBrenda A Archer [birth year]  Diagnosis: UTI with obstructive neprholithaisis and ESBL E coli   Culture results  Specimen Description --   URINE, RANDOM  Performed at [facility], [address] [place], [place] [address]    Special Requests --   NONE  Performed at [facility], [address] [place], [place] [address]    Culture --Abnormal    >=100,000 COLONIES/mL ESCHERICHIA COLI  Confirmed Extended Spectrum Beta-Lactamase Producer (ESBL). In bloodstream infections from ESBL organisms, carbapenems are preferred over piperacillin/tazobactam. They are shown to have a lower risk of mortality.  Abnormal    Report Status 12/20/2017 FINAL   Organism ID, Bacteria ESCHERICHIA COLIAbnormal   Susceptibility   Escherichia coli ([id number])   Antibiotic Interpretation Microscan Method Status   AMPICILLIN Resistant >=32 RESISTANT MIC Final   CEFAZOLIN Resistant >=64 RESISTANT MIC Final   CEFTRIAXONE Resistant >=64 RESISTANT MIC Final   CIPROFLOXACIN Resistant >=4 RESISTANT MIC Final   GENTAMICIN Sensitive <=1 SENSITIVE MIC Final   IMIPENEM Sensitive <=0.25 SENSITIVE MIC Final   NITROFURANTOIN Sensitive <=16 SENSITIVE MIC Final   TRIMETH/SULFA Resistant >=320 RESISTANT MIC Final   AMPICILLIN/SULBACTAM Resistant >=32 RESISTANT MIC Final   PIP/TAZO Sensitive 8 SENSITIVE MIC Final   Extended ESBL Resistant POSITIVE MIC Final   Susceptibility Comments   >=100,000 COLONIES/mL ESCHERICHIA COLI         LABS Lab Results  Component Value Date   CREATININE 1.31 (H) 12/20/2017   Lab Results  Component Value Date   WBC 9.6 12/20/2017   HGB 8.7 (L) 12/20/2017   HCT 27.3 (L) 12/20/2017   MCV 85.0 12/20/2017   PLT 181 12/20/2017   No results found for: ESRSEDRATE, POCTSEDRATE No results found for: CRP  Allergies:  Allergies  Allergen Reactions  . Codeine Nausea Only    Discharge antibiotics Ertapenem   1    Gram  every 24  hours   PICC Care per protocol Labs weekly while on IV antibiotics -FAX weekly labs to [phone] CBC w diff   Bun and Cr  Planned duration of antibiotics 14 days from admit PICC Line may be pulled at completion of therapy  Stop date 12/30/17 Follow up clinic date NO ID FU   Mick Sellavid P Darlena Koval, MD

## 2017-12-20 NOTE — Plan of Care (Signed)
  Problem: Education: Goal: Knowledge of General Education information will improve Description: Including pain rating scale, medication(s)/side effects and non-pharmacologic comfort measures Outcome: Progressing   Problem: Health Behavior/Discharge Planning: Goal: Ability to manage health-related needs will improve Outcome: Progressing   Problem: Clinical Measurements: Goal: Ability to maintain clinical measurements within normal limits will improve Outcome: Progressing Goal: Will remain free from infection Outcome: Progressing Goal: Diagnostic test results will improve Outcome: Progressing Goal: Respiratory complications will improve Outcome: Progressing Goal: Cardiovascular complication will be avoided Outcome: Progressing   Problem: Activity: Goal: Risk for activity intolerance will decrease Outcome: Progressing   Problem: Nutrition: Goal: Adequate nutrition will be maintained Outcome: Progressing   Problem: Coping: Goal: Level of anxiety will decrease Outcome: Progressing   Problem: Elimination: Goal: Will not experience complications related to bowel motility Outcome: Progressing Goal: Will not experience complications related to urinary retention Outcome: Progressing   Problem: Pain Managment: Goal: General experience of comfort will improve Outcome: Progressing   Problem: Safety: Goal: Ability to remain free from injury will improve Outcome: Progressing   Problem: Skin Integrity: Goal: Risk for impaired skin integrity will decrease Outcome: Progressing   Problem: Fluid Volume: Goal: Hemodynamic stability will improve Outcome: Progressing   Problem: Clinical Measurements: Goal: Diagnostic test results will improve Outcome: Progressing Goal: Signs and symptoms of infection will decrease Outcome: Progressing   Problem: Respiratory: Goal: Ability to maintain adequate ventilation will improve Outcome: Progressing   Problem: Activity: Goal: Ability  to tolerate increased activity will improve Outcome: Progressing   Problem: Clinical Measurements: Goal: Ability to maintain a body temperature in the normal range will improve Outcome: Progressing   Problem: Respiratory: Goal: Ability to maintain adequate ventilation will improve Outcome: Progressing Goal: Ability to maintain a clear airway will improve Outcome: Progressing   

## 2017-12-20 NOTE — Progress Notes (Signed)
[facility] [place] at [facility]   PATIENT NAME: Kristina BurdockBrenda Delon    MR#:  [id number]  DATE OF BIRTH:  18-Dec-1945  SUBJECTIVE:  CHIEF COMPLAINT: Reporting improving shortness of breath  Feeling weak and tired no diarrhea Rested well last night with Ambien  could not follow-up with urology as scheduled because she was weak.  No diarrhea and no bowel movements in the past 24 hours  REVIEW OF SYSTEMS:  CONSTITUTIONAL: No fever, fatigue or weakness.  EYES: No blurred or double vision.  EARS, NOSE, AND THROAT: No tinnitus or ear pain.  RESPIRATORY: No cough, shortness of breath, wheezing or hemoptysis.  CARDIOVASCULAR: No chest pain, orthopnea, edema.  GASTROINTESTINAL: No nausea, vomiting, diarrhea or abdominal pain.  GENITOURINARY: No dysuria, hematuria.  ENDOCRINE: No polyuria, nocturia,  HEMATOLOGY: No anemia, easy bruising or bleeding SKIN: No rash or lesion. MUSCULOSKELETAL: No joint pain or arthritis.   NEUROLOGIC: No tingling, numbness, weakness.  PSYCHIATRY: No anxiety or depression.   DRUG ALLERGIES:   Allergies  Allergen Reactions  . Codeine Nausea Only    VITALS:  Blood pressure 130/62, pulse 70, temperature 99.1 F (37.3 C), temperature source Oral, resp. rate 16, height 5\' 2"  (1.575 m), weight 85.2 kg, SpO2 97 %.  PHYSICAL EXAMINATION:  GENERAL:  72 y.o.-year-old patient lying in the bed with no acute distress.  EYES: Pupils equal, round, reactive to light and accommodation. No scleral icterus. Extraocular muscles intact.  HEENT: Head atraumatic, normocephalic. Oropharynx and nasopharynx clear.  NECK:  Supple, no jugular venous distention. No thyroid enlargement, no tenderness.  LUNGS: Normal breath sounds bilaterally, no wheezing, rales,rhonchi or crepitation. No use of accessory muscles of respiration.  CARDIOVASCULAR: S1, S2 normal. No murmurs, rubs, or gallops.  ABDOMEN: Soft, nontender, nondistended. Bowel sounds present. No  organomegaly or mass.  EXTREMITIES: No pedal edema, cyanosis, or clubbing.  NEUROLOGIC: Cranial nerves II through XII are intact. Muscle strength 5/5 in all extremities. Sensation intact. Gait not checked.  PSYCHIATRIC: The patient is alert and oriented x 3.  SKIN: No obvious rash, lesion, or ulcer.    LABORATORY PANEL:   CBC Recent Labs  Lab 12/20/17 0017  WBC 9.6  HGB 8.7*  HCT 27.3*  PLT 181   ------------------------------------------------------------------------------------------------------------------  Chemistries  Recent Labs  Lab 12/19/17 0442 12/20/17 0017  NA 137 135  K 4.2 4.9  CL 110 109  CO2 25 21*  GLUCOSE 151* 129*  BUN 21 18  CREATININE 1.47* 1.31*  CALCIUM 8.8* 9.3  MG 2.5* 2.2  AST 21  --   ALT 13  --   ALKPHOS 60  --   BILITOT 0.8  --    ------------------------------------------------------------------------------------------------------------------  Cardiac Enzymes No results for input(s): TROPONINI in the last 168 hours. ------------------------------------------------------------------------------------------------------------------  RADIOLOGY:  Dg Chest 2 View  Result Date: 12/19/2017 CLINICAL DATA:  Pneumonia EXAM: CHEST - 2 VIEW COMPARISON:  Portable chest x-ray of December 17, 2017 FINDINGS: The lungs are better inflated today. Patchy density in the left lower lobe is less conspicuous but has not cleared. The cardiac silhouette remains enlarged. The pulmonary vascularity is not engorged. There is calcification in the wall of the aortic arch. The bony thorax exhibits no acute abnormality. IMPRESSION: Improving left lower lobe pneumonia. A follow-up chest x-ray in 2-3 weeks is recommended to assure complete clearing. Enlargement of the cardiac silhouette without pulmonary vascular congestion or pulmonary edema. Electronically Signed   By: David  Swazil[place] M.D.   On: 12/19/2017 13:26  Dg Chest Port 1 View  Result Date: 12/19/2017 CLINICAL  DATA:  Dyspnea. EXAM: PORTABLE CHEST 1 VIEW COMPARISON:  12/19/2017. FINDINGS: Stable enlarged cardiac silhouette. Mild increase in prominence of the pulmonary vasculature and interstitial markings with some Kerley lines. Small bilateral pleural effusions. Diffuse osteopenia. IMPRESSION: Interval changes of acute congestive heart failure with stable cardiomegaly. Electronically Signed   By: Beckie Salts M.D.   On: 12/19/2017 23:40    EKG:   Orders placed or performed during the hospital encounter of 11/02/17  . ED EKG  . ED EKG  . EKG 12-Lead  . EKG 12-Lead  . EKG 12-Lead  . EKG 12-Lead  . EKG 12-Lead  . EKG 12-Lead  . EKG 12-Lead  . EKG 12-Lead  . EKG 12-Lead  . EKG 12-Lead  . EKG 12-Lead  . EKG 12-Lead  . EKG 12-Lead  . EKG 12-Lead  . EKG 12-Lead  . EKG 12-Lead    ASSESSMENT AND PLAN:    1.  Sepsis, likely secondary to pneumonia and UTI.   On broad-spectrum IV antibiotics and IV fluids.   Will discontinue IV fluids as patient is clinically improving Procalcitonin is still elevated 23.5 but leukocytosis is trending down and patient is afebrile clinically improving we will repeat procalcitonin level in a.m.  Continue supportive measures with oxygen as needed pain control.   Repeat chest x-ray on 12/19/2017 with improving left lower lobe pneumonia   Will check stool for C. Difficile infection as she has a watery bowel movement, patient denies any BM 2.  HCAP,  broad-spectrum IV antibiotics, oxygen and nebulizer. Repeat chest x-ray today with improving left lower lobe pneumonia 3.  UTI, secondary to kidney stone.  Patient is status post recent right ureteral stent placement.   Seen by urology reports that patient missed her follow-up appointment Recommending outpatient follow-up with urology after discharge regarding the kidney stone  Urine culture with greater than 100,000 colonies of E. coli, given the history of ESBL in the past continue meropenem for now Consult infectious  disease 4.  Diabetes type 2.  Will monitor blood sugars before meals and at bedtime and use insulin treatment during the hospital stay. 5. ARF/CKD3, likely prerenal. IV fluids and monitor kidney function closely.  Avoid nephrotoxic medications. Creatinine 1.48-1.47, baseline creatinine seems to be at 1.1 Check a.m. labs 6.  Hypokalemia, likely secondary to diarrhea.     potassium at 4.9 .  Magnesium at 2.1 7.  History of cardiomyopathy recent echocardiogram with 20 to 25% ejection fraction in October 2019 on Eliquis continue outpatient follow-up with cardiology  8.  Generalized weakness PT assessment- snf and walker with 5 inch wheels.  Offered home health with physical therapy but patient is refusing as she lives alone, very uncomfortable with home health with physical therapy and agreeable with skilled nursing care facility Awaiting insurance authorization  9.  Insomnia patient is requesting Ambien will start Ambien nightly    All the records are reviewed and case discussed with Care Management/Social Workerr. Management plans discussed with the patient, family and they are in agreement.  CODE STATUS: dnr   TOTAL TIME TAKING CARE OF THIS PATIENT: 36  minutes.   POSSIBLE D/C IN 1-2  DAYS, DEPENDING ON CLINICAL CONDITION.  Note: This dictation was prepared with Dragon dictation along with smaller phrase technology. Any transcriptional errors that result from this process are unintentional.   Ramonita Lab M.D on 12/20/2017 at 4:04 PM  Between 7am to 6pm - Pager -  [phone] After 6pm go to [email or web address] - password [email or web address] [facility]  [place] [facility]  Office  [phone]  CC: Primary care physician; Corky Downs, MD

## 2017-12-21 LAB — GLUCOSE, CAPILLARY
GLUCOSE-CAPILLARY: 159 mg/dL — AB (ref 70–99)
GLUCOSE-CAPILLARY: 186 mg/dL — AB (ref 70–99)
Glucose-Capillary: 144 mg/dL — ABNORMAL HIGH (ref 70–99)
Glucose-Capillary: 176 mg/dL — ABNORMAL HIGH (ref 70–99)

## 2017-12-21 LAB — POTASSIUM: Potassium: 3.8 mmol/L (ref 3.5–5.1)

## 2017-12-21 LAB — PROCALCITONIN: Procalcitonin: 9.22 ng/mL

## 2017-12-21 MED ORDER — POTASSIUM CHLORIDE CRYS ER 20 MEQ PO TBCR
20.0000 meq | EXTENDED_RELEASE_TABLET | Freq: Once | ORAL | Status: AC
  Administered 2017-12-21: 20 meq via ORAL
  Filled 2017-12-21: qty 1

## 2017-12-21 MED ORDER — TRAZODONE HCL 50 MG PO TABS
50.0000 mg | ORAL_TABLET | Freq: Every day | ORAL | Status: DC
  Administered 2017-12-21: 50 mg via ORAL
  Filled 2017-12-21: qty 1

## 2017-12-21 MED ORDER — SODIUM CHLORIDE 0.9 % IV SOLN
1.0000 g | INTRAVENOUS | Status: DC
  Administered 2017-12-21: 1000 mg via INTRAVENOUS
  Filled 2017-12-21 (×2): qty 1

## 2017-12-21 NOTE — Care Management Important Message (Signed)
Important Message  Patient Details  Name: Kristina Archer MRN: [id number] Date of Birth: 22-Jan-1945   Medicare Important Message Given:  Yes    Aitana Burry A Jolanta Cabeza, RN 12/21/2017, 11:28 AM

## 2017-12-21 NOTE — Progress Notes (Signed)
[facility] CLINIC INFECTIOUS DISEASE PROGRESS NOTE Date of Admission:  12/17/2017     ID: Kristina Archer is a 72 y.o. female with ESBL E coli.  Active Problems:   Sepsis (HCC)   Urinary tract infection with hematuria   Pneumonia of both lower lobes due to infectious organism [facility])  Subjective: Low grade fever but denies any new complaints.  ROS  Eleven systems are reviewed and negative except per hpi  Medications:  Antibiotics Given (last 72 hours)    Date/Time Action Medication Dose Rate   12/18/17 1225 Given   vancomycin (VANCOCIN) 50 mg/mL oral solution 125 mg 125 mg    12/18/17 1358 New Bag/Given   meropenem (MERREM) 1 g in sodium chloride 0.9 % 100 mL IVPB 1 g 200 mL/hr   12/19/17 0310 New Bag/Given   meropenem (MERREM) 1 g in sodium chloride 0.9 % 100 mL IVPB 1 g 200 mL/hr   12/19/17 1412 New Bag/Given   meropenem (MERREM) 1 g in sodium chloride 0.9 % 100 mL IVPB 1 g 200 mL/hr   12/20/17 0124 New Bag/Given   meropenem (MERREM) 1 g in sodium chloride 0.9 % 100 mL IVPB 1 g 200 mL/hr   12/20/17 1509 New Bag/Given   meropenem (MERREM) 1 g in sodium chloride 0.9 % 100 mL IVPB 1 g 200 mL/hr   12/21/17 0100 New Bag/Given   meropenem (MERREM) 1 g in sodium chloride 0.9 % 100 mL IVPB 1 g 200 mL/hr     . apixaban  5 mg Oral BID  . carvedilol  3.125 mg Oral BID WC  . docusate sodium  100 mg Oral BID  . feeding supplement (GLUCERNA SHAKE)  237 mL Oral BID BM  . furosemide  20 mg Oral Daily  . insulin aspart  0-15 Units Subcutaneous TID WC  . insulin aspart  0-5 Units Subcutaneous QHS  . insulin glargine  12 Units Subcutaneous QHS  . multivitamin with minerals  1 tablet Oral Daily  . pantoprazole  40 mg Oral Daily  . PARoxetine  20 mg Oral QHS  . potassium chloride  20 mEq Oral Once  . rosuvastatin  40 mg Oral Daily  . zolpidem  5 mg Oral QHS    Objective: Vital signs in last 24 hours: Temp:  [98 F (36.7 C)-100.5 F (38.1 C)] 98 F (36.7 C) (12/12 0750) Pulse Rate:   [69-74] 70 (12/12 0750) Resp:  [18] 18 (12/11 2329) BP: (124-131)/(55-62) 131/58 (12/12 0750) SpO2:  [91 %-97 %] 94 % (12/12 0750) Constitutional:  oriented to person, place, and time. appears well-developed and well-nourished. No distress.  HENT: Archer/AT, PERRLA, no scleral icterus Mouth/Throat: Oropharynx is clear and moist. No oropharyngeal exudate.  Cardiovascular: Normal rate, regular rhythm  Pulmonary/Chest: bibasilar rhonchi Neck = supple, no nuchal rigidity Abdominal: Soft. Bowel sounds are normal.  exhibits no distension. There is no tenderness.  Lymphadenopathy: no cervical adenopathy. No axillary adenopathy Neurological: alert and oriented to person, place, and time.  Skin: Skin is warm and dry. No rash noted. No erythema.  Psychiatric: a normal mood and affect.  behavior is normal.   Lab Results Recent Labs    12/19/17 0442 12/20/17 0017 12/21/17 0730  WBC  --  9.6  --   HGB  --  8.7*  --   HCT  --  27.3*  --   NA 137 135  --   K 4.2 4.9 3.8  CL 110 109  --   CO2 25 21*  --  BUN 21 18  --   CREATININE 1.47* 1.31*  --     Microbiology: @micro @ Studies/Results: Dg Chest 2 View  Result Date: 12/19/2017 CLINICAL DATA:  Pneumonia EXAM: CHEST - 2 VIEW COMPARISON:  Portable chest x-ray of December 17, 2017 FINDINGS: The lungs are better inflated today. Patchy density in the left lower lobe is less conspicuous but has not cleared. The cardiac silhouette remains enlarged. The pulmonary vascularity is not engorged. There is calcification in the wall of the aortic arch. The bony thorax exhibits no acute abnormality. IMPRESSION: Improving left lower lobe pneumonia. A follow-up chest x-ray in 2-3 weeks is recommended to assure complete clearing. Enlargement of the cardiac silhouette without pulmonary vascular congestion or pulmonary edema. Electronically Signed   By: Marisol Glazer  [place] M.D.   On: 12/19/2017 13:26   Dg Chest Port 1 View  Result Date: 12/19/2017 CLINICAL DATA:   Dyspnea. EXAM: PORTABLE CHEST 1 VIEW COMPARISON:  12/19/2017. FINDINGS: Stable enlarged cardiac silhouette. Mild increase in prominence of the pulmonary vasculature and interstitial markings with some Kerley lines. Small bilateral pleural effusions. Diffuse osteopenia. IMPRESSION: Interval changes of acute congestive heart failure with stable cardiomegaly. Electronically Signed   By: Beckie SaltsSteven  Reid M.D.   On: 12/19/2017 23:40   [place] Ekg Site Rite  Result Date: 12/20/2017 If Site Rite image not attached, placement could not be confirmed due to current cardiac rhythm.   Assessment/Plan: Kristina Archer is a 72 y.o. female with multiple medical problems including long hx of nephrolithiasis and recurrent UTIs and recent admission 10/24-11/4 with obstructive hydronephrosis and ESBL E coli infection. Now readmitted with NV diarrhea, sepsis, and recurrent UTI as well as Pneumonia.  Clinically much improved with meropenem. Stent seems well placed per urology based on CT imaging. Will be sent to SNF for PT. 12/12 - low grade fever but otherwise improving Recommendations Place PICC tomorrow if remains afebrile IV ertapenem 1 gm QD for 14 total day abx course- stop date 12/21 After that can remove picc and agree to start macrobid suppressive therapy 100 qd. Would try to expediate treatment of the stones and try to remove stents and stones ASAP as infection likely to have seeded these and will likely recur if definitive treatment not performed.  No need for ID outpatient follow up. See Abx order sheet. Thank you very much for the consult. Will follow with you.  Mick SellDavid P Zaydenn Balaguer   12/21/2017, 11:05 AM

## 2017-12-21 NOTE — Progress Notes (Signed)
Per MD patient is not stable for D/C today. [facility] is aware of above and is aware of IV ABX orders. Patient and her son Toney SangRusty are aware of above. Clinical Social Worker (CSW) will continue to follow and assist as needed.   Baker Hughes IncorporatedBailey Kejuan Bekker, LCSW [phone]

## 2017-12-21 NOTE — Progress Notes (Signed)
Pharmacy Electrolyte Monitoring Consult:  Pharmacy consulted to assist in monitoring and replacing electrolytes in this 72 y.o. female admitted on 12/17/2017 with Vomiting and Diarrhea   Labs:  Sodium (mmol/L)  Date Value  12/20/2017 135  10/11/2011 143   Potassium (mmol/L)  Date Value  12/21/2017 3.8  10/11/2011 3.4 (L)   Magnesium (mg/dL)  Date Value  16/10/960412/11/2017 2.2   Phosphorus (mg/dL)  Date Value  54/09/811911/01/2017 2.8   Calcium (mg/dL)  Date Value  14/78/295612/11/2017 9.3   Calcium, Total (mg/dL)  Date Value  21/30/865710/01/2011 10.0   Albumin (g/dL)  Date Value  84/69/629512/10/2017 2.4 (L)  10/11/2011 4.3    Assessment/Plan: 12/10 K - 3.8  K down from 4.9 yesterday.  Patient receiving Furosemide 20 mg daily.  Will order KCl PO 20 mEq once and recheck with morning labs.  Abbie Sonshris Yussuf Sawyers, PharmD Clinical Pharmacist 12/21/2017

## 2017-12-21 NOTE — Progress Notes (Signed)
Physical Therapy Treatment Patient Details Name: Kristina Archer MRN: [id number] DOB: 03-Oct-1945 Today's Date: 12/21/2017    History of Present Illness Pt is a 72 y/o F who presented with nausea, diarrhea, fever. Pt has h/o recent R uretal stent placement for infected proximal uretreral stone. Pt found to have UTI. CT showed hydronephrosis.  Pt's PMH includes sciatica, LBBB, cardiomyopathy.      PT Comments    Ms. Andre made good progress with mobility but demonstrates guarded posture and decreased gait speed while ambulating.  Pt fatigues with ambulation. SNF remains most appropriate d/c plan at this time.   Follow Up Recommendations  SNF     Equipment Recommendations  Rolling walker with 5" wheels(pt's RW is old and borrowed)    Recommendations for Other Services       Precautions / Restrictions Precautions Precautions: Fall Restrictions Weight Bearing Restrictions: No    Mobility  Bed Mobility               General bed mobility comments: Pt sitting EOB upon PT arrival and sitting in chair at end of session  Transfers Overall transfer level: Needs assistance Equipment used: Rolling walker (2 wheeled) Transfers: Sit to/from Stand Sit to Stand: Min guard         General transfer comment: Min guard for safety.  No instability noted.  Ambulation/Gait Ambulation/Gait assistance: Min guard Gait Distance (Feet): 55 Feet Assistive device: Rolling walker (2 wheeled) Gait Pattern/deviations: Decreased step length - right;Decreased step length - left Gait velocity: decreased   General Gait Details: Pt remains steady but ambulates with guarded posture with intentionally with dec gait speed.    Stairs             Wheelchair Mobility    Modified Rankin (Stroke Patients Only)       Balance Overall balance assessment: Needs assistance Sitting-balance support: No upper extremity supported;Feet supported Sitting balance-Leahy Scale: Good     Standing  balance support: No upper extremity supported;During functional activity Standing balance-Leahy Scale: Fair Standing balance comment: Pt able to stand statically without UE support but relies on RW to ambulate                            Cognition Arousal/Alertness: Awake/alert Behavior During Therapy: WFL for tasks assessed/performed Overall Cognitive Status: Within Functional Limits for tasks assessed                                        Exercises Other Exercises Other Exercises: Encouraged pt to ambulate in room at least 3x/day with nursing staff    General Comments General comments (skin integrity, edema, etc.): Pt's SpO2 97% at end of ambulation on RA. No SOB noted during session.       Pertinent Vitals/Pain Pain Assessment: Faces Faces Pain Scale: No hurt(no signs of pain) Pain Intervention(s): Monitored during session    Home Living                      Prior Function            PT Goals (current goals can now be found in the care plan section) Acute Rehab PT Goals Patient Stated Goal: to improve mobility and be as independent as possible PT Goal Formulation: With patient Time For Goal Achievement: 01/02/18 Potential to Achieve Goals: Good  Progress towards PT goals: Progressing toward goals    Frequency    Min 2X/week      PT Plan Current plan remains appropriate    Co-evaluation              AM-PAC PT "6 Clicks" Mobility   Outcome Measure  Help needed turning from your back to your side while in a flat bed without using bedrails?: A Little Help needed moving from lying on your back to sitting on the side of a flat bed without using bedrails?: A Little Help needed moving to and from a bed to a chair (including a wheelchair)?: A Little Help needed standing up from a chair using your arms (e.g., wheelchair or bedside chair)?: A Little Help needed to walk in hospital room?: A Little Help needed climbing 3-5  steps with a railing? : A Lot 6 Click Score: 17    End of Session Equipment Utilized During Treatment: Gait belt Activity Tolerance: Patient tolerated treatment well Patient left: in chair;with call bell/phone within reach;with chair alarm set Nurse Communication: Mobility status PT Visit Diagnosis: Muscle weakness (generalized) (M62.81);Unsteadiness on feet (R26.81)     Time: 1610-96041113-1134 PT Time Calculation (min) (ACUTE ONLY): 21 min  Charges:  $Gait Training: 8-22 mins                     Session was performed by student PT, Lisbeth RenshawShane Courtney, and directed, overseen, and documented by this PT.  Encarnacion ChuAshley Cheyne Boulden PT, DPT 12/21/2017, 11:45 AM

## 2017-12-21 NOTE — Progress Notes (Signed)
[facility] -  at [facility]   PATIENT NAME: Kristina Archer    MR#:  [id number]  DATE OF BIRTH:  [birth year]  SUBJECTIVE:  CHIEF COMPLAINT: Reporting improving shortness of breath  Patient is reporting bad dreams with Ambien ,requesting trazodone and to discontinue Ambien  could not follow-up with urology as scheduled because she was weak.  No diarrhea and no bowel movements in the past 24 hours  REVIEW OF SYSTEMS:  CONSTITUTIONAL: No fever, fatigue or weakness.  EYES: No blurred or double vision.  EARS, NOSE, AND THROAT: No tinnitus or ear pain.  RESPIRATORY: No cough, shortness of breath, wheezing or hemoptysis.  CARDIOVASCULAR: No chest pain, orthopnea, edema.  GASTROINTESTINAL: No nausea, vomiting, diarrhea or abdominal pain.  GENITOURINARY: No dysuria, hematuria.  ENDOCRINE: No polyuria, nocturia,  HEMATOLOGY: No anemia, easy bruising or bleeding SKIN: No rash or lesion. MUSCULOSKELETAL: No joint pain or arthritis.   NEUROLOGIC: No tingling, numbness, weakness.  PSYCHIATRY: No anxiety or depression.   DRUG ALLERGIES:   Allergies  Allergen Reactions  . Codeine Nausea Only    VITALS:  Blood pressure (!) 131/58, pulse 70, temperature 98 F (36.7 C), temperature source Oral, resp. rate 18, height 5\' 2"  (1.575 m), weight 85.2 kg, SpO2 94 %.  PHYSICAL EXAMINATION:  GENERAL:  72 y.o.-year-old patient lying in the bed with no acute distress.  EYES: Pupils equal, round, reactive to light and accommodation. No scleral icterus. Extraocular muscles intact.  HEENT: Head atraumatic, normocephalic. Oropharynx and nasopharynx clear.  NECK:  Supple, no jugular venous distention. No thyroid enlargement, no tenderness.  LUNGS: Normal breath sounds bilaterally, no wheezing, rales,rhonchi or crepitation. No use of accessory muscles of respiration.  CARDIOVASCULAR: S1, S2 normal. No murmurs, rubs, or gallops.  ABDOMEN: Soft, nontender, nondistended. Bowel  sounds present.  EXTREMITIES: No pedal edema, cyanosis, or clubbing.  NEUROLOGIC: Awake, alert and oriented x3 sensation intact. Gait not checked.  PSYCHIATRIC: The patient is alert and oriented x 3.  SKIN: No obvious rash, lesion, or ulcer.    LABORATORY PANEL:   CBC Recent Labs  Lab 12/20/17 0017  WBC 9.6  HGB 8.7*  HCT 27.3*  PLT 181   ------------------------------------------------------------------------------------------------------------------  Chemistries  Recent Labs  Lab 12/19/17 0442 12/20/17 0017 12/21/17 0730  NA 137 135  --   K 4.2 4.9 3.8  CL 110 109  --   CO2 25 21*  --   GLUCOSE 151* 129*  --   BUN 21 18  --   CREATININE 1.47* 1.31*  --   CALCIUM 8.8* 9.3  --   MG 2.5* 2.2  --   AST 21  --   --   ALT 13  --   --   ALKPHOS 60  --   --   BILITOT 0.8  --   --    ------------------------------------------------------------------------------------------------------------------  Cardiac Enzymes No results for input(s): TROPONINI in the last 168 hours. ------------------------------------------------------------------------------------------------------------------  RADIOLOGY:  Dg Chest Port 1 View  Result Date: 12/19/2017 CLINICAL DATA:  Dyspnea. EXAM: PORTABLE CHEST 1 VIEW COMPARISON:  12/19/2017. FINDINGS: Stable enlarged cardiac silhouette. Mild increase in prominence of the pulmonary vasculature and interstitial markings with some Kerley lines. Small bilateral pleural effusions. Diffuse osteopenia. IMPRESSION: Interval changes of acute congestive heart failure with stable cardiomegaly. Electronically Signed   By: Beckie Salts M.D.   On: 12/19/2017 23:40   [place] Ekg Site Rite  Result Date: 12/20/2017 If Site Rite image not attached, placement could not be  confirmed due to current cardiac rhythm.   EKG:   Orders placed or performed during the hospital encounter of 11/02/17  . ED EKG  . ED EKG  . EKG 12-Lead  . EKG 12-Lead  . EKG 12-Lead  .  EKG 12-Lead  . EKG 12-Lead  . EKG 12-Lead  . EKG 12-Lead  . EKG 12-Lead  . EKG 12-Lead  . EKG 12-Lead  . EKG 12-Lead  . EKG 12-Lead  . EKG 12-Lead  . EKG 12-Lead  . EKG 12-Lead  . EKG 12-Lead    ASSESSMENT AND PLAN:    1.  Sepsis, likely secondary to pneumonia and UTI.   On broad-spectrum IV antibiotics and IV fluids.    discontinued IV fluids as patient is clinically improving Procalcitonin is still elevated 23.5 but leukocytosis is trending down and patient is afebrile clinically improving we will repeat procalcitonin level in a.m.  Continue supportive measures with oxygen as needed pain control.   Repeat chest x-ray on 12/19/2017 with improving left lower lobe pneumonia    2.  HCAP,  broad-spectrum IV antibiotics, oxygen and nebulizer. Repeat chest x-ray today with improving left lower lobe pneumonia  3.    ESBL E. coli UTI, secondary to kidney stone.  Patient is status post recent right ureteral stent placement.   Seen by urology reports that patient missed her follow-up appointment.   Recommending outpatient follow-up with urology after discharge regarding the kidney stone  Urine culture with greater than 100,000 colonies of E. coli, given the history of ESBL in the past continue meropenem for now Seen by ID Dr. Sampson Goon appreciate his recommendations Will place PICC line tomorrow if patient is afebrile for the next 24 hours And is to discharge patient with IV atropine and 1 g once daily for 14 days with a stop date 12/30/2017 Procalcitonin 15.11-9.22  4.  Diabetes type 2.  Will monitor blood sugars before meals and at bedtime and use insulin treatment during the hospital stay.  5. ARF/CKD3, likely prerenal. IV fluids and monitor kidney function closely.  Avoid nephrotoxic medications. Creatinine 1.48-1.47-1.31 baseline creatinine seems to be at 1.1  6.  Hypokalemia, likely secondary to diarrhea.     potassium at 4.9 .  Magnesium at 2.1  7.  History of  cardiomyopathy recent echocardiogram with 20 to 25% ejection fraction in October 2019 on Eliquis continue outpatient follow-up with cardiology  8.  Generalized weakness PT assessment- snf and walker with 5 inch wheels.  Offered home health with physical therapy but patient is refusing as she lives alone, very uncomfortable with home health with physical therapy and agreeable with skilled nursing care facility Awaiting insurance authorization  9.  Insomnia patient had a bad dreams with Ambien requesting to discontinue Ambien and prefers trazodone  Disposition skilled nursing facility when patient is clinically stable [facility]  All the records are reviewed and case discussed with Care Management/Social Workerr. Management plans discussed with the patient she is in agreement CODE STATUS: dnr   TOTAL TIME TAKING CARE OF THIS PATIENT: 36  minutes.   POSSIBLE D/C IN 1-  DAYS, DEPENDING ON CLINICAL CONDITION.  Note: This dictation was prepared with Dragon dictation along with smaller phrase technology. Any transcriptional errors that result from this process are unintentional.   Ramonita Lab M.D on 12/21/2017 at 1:53 PM  Between 7am to 6pm - Pager - [phone] After 6pm go to [email or web address] - password [email or web address] [facility]  [facility] [place] [facility]  Office  [phone]  CC:  Primary care physician; Corky DownsMasoud, [name], MD

## 2017-12-21 NOTE — Progress Notes (Signed)
PHARMACY CONSULT NOTE FOR:  OUTPATIENT  PARENTERAL ANTIBIOTIC THERAPY (OPAT)  Indication: ESBL E. Coli UTI Regimen: ertapenem 1gm q24h End date: 12/30/2017  IV antibiotic discharge orders are pended. To discharging provider:  please sign these orders via discharge navigator,  Select New Orders & click on the button choice - Manage This Unsigned Work.     Thank you for allowing pharmacy to be a part of this patient's care.  Juliette Alcideustin Zeigler, PharmD, BCPS.   Work Cell: [phone] 12/21/2017 9:44 AM

## 2017-12-22 DIAGNOSIS — A419 Sepsis, unspecified organism: Secondary | ICD-10-CM | POA: Diagnosis not present

## 2017-12-22 DIAGNOSIS — M2550 Pain in unspecified joint: Secondary | ICD-10-CM | POA: Diagnosis not present

## 2017-12-22 DIAGNOSIS — Z7401 Bed confinement status: Secondary | ICD-10-CM | POA: Diagnosis not present

## 2017-12-22 DIAGNOSIS — A4151 Sepsis due to Escherichia coli [E. coli]: Secondary | ICD-10-CM | POA: Diagnosis not present

## 2017-12-22 DIAGNOSIS — A498 Other bacterial infections of unspecified site: Secondary | ICD-10-CM | POA: Diagnosis not present

## 2017-12-22 DIAGNOSIS — M6281 Muscle weakness (generalized): Secondary | ICD-10-CM | POA: Diagnosis not present

## 2017-12-22 DIAGNOSIS — N179 Acute kidney failure, unspecified: Secondary | ICD-10-CM | POA: Diagnosis not present

## 2017-12-22 DIAGNOSIS — Z1612 Extended spectrum beta lactamase (ESBL) resistance: Secondary | ICD-10-CM | POA: Diagnosis not present

## 2017-12-22 DIAGNOSIS — R112 Nausea with vomiting, unspecified: Secondary | ICD-10-CM | POA: Diagnosis not present

## 2017-12-22 DIAGNOSIS — J9601 Acute respiratory failure with hypoxia: Secondary | ICD-10-CM | POA: Diagnosis not present

## 2017-12-22 DIAGNOSIS — J189 Pneumonia, unspecified organism: Secondary | ICD-10-CM | POA: Diagnosis not present

## 2017-12-22 DIAGNOSIS — N201 Calculus of ureter: Secondary | ICD-10-CM | POA: Diagnosis not present

## 2017-12-22 DIAGNOSIS — R0902 Hypoxemia: Secondary | ICD-10-CM | POA: Diagnosis not present

## 2017-12-22 DIAGNOSIS — N183 Chronic kidney disease, stage 3 (moderate): Secondary | ICD-10-CM | POA: Diagnosis not present

## 2017-12-22 DIAGNOSIS — J181 Lobar pneumonia, unspecified organism: Secondary | ICD-10-CM | POA: Diagnosis not present

## 2017-12-22 DIAGNOSIS — N200 Calculus of kidney: Secondary | ICD-10-CM | POA: Diagnosis not present

## 2017-12-22 DIAGNOSIS — N390 Urinary tract infection, site not specified: Secondary | ICD-10-CM | POA: Diagnosis not present

## 2017-12-22 DIAGNOSIS — E1122 Type 2 diabetes mellitus with diabetic chronic kidney disease: Secondary | ICD-10-CM | POA: Diagnosis not present

## 2017-12-22 DIAGNOSIS — I959 Hypotension, unspecified: Secondary | ICD-10-CM | POA: Diagnosis not present

## 2017-12-22 LAB — BASIC METABOLIC PANEL
Anion gap: 7 (ref 5–15)
BUN: 15 mg/dL (ref 8–23)
CO2: 27 mmol/L (ref 22–32)
Calcium: 9.5 mg/dL (ref 8.9–10.3)
Chloride: 105 mmol/L (ref 98–111)
Creatinine, Ser: 1.02 mg/dL — ABNORMAL HIGH (ref 0.44–1.00)
GFR calc Af Amer: >60 mL/min (ref >60)
GFR, EST NON AFRICAN AMERICAN: 56 mL/min — AB (ref >60)
Glucose, Bld: 136 mg/dL — ABNORMAL HIGH (ref 70–99)
Potassium: 4 mmol/L (ref 3.5–5.1)
Sodium: 139 mmol/L (ref 135–145)

## 2017-12-22 LAB — GLUCOSE, CAPILLARY
GLUCOSE-CAPILLARY: 128 mg/dL — AB (ref 70–99)
Glucose-Capillary: 176 mg/dL — ABNORMAL HIGH (ref 70–99)

## 2017-12-22 LAB — CULTURE, BLOOD (ROUTINE X 2)
Culture: NO GROWTH
Culture: NO GROWTH

## 2017-12-22 LAB — PROCALCITONIN: Procalcitonin: 3.74 ng/mL

## 2017-12-22 MED ORDER — SODIUM CHLORIDE 0.9% FLUSH: 10.0000 mL | INTRAVENOUS | Status: DC | PRN

## 2017-12-22 MED ORDER — GLUCERNA SHAKE PO LIQD: 237.0000 mL | Freq: Two times a day (BID) | ORAL | 0 refills | Status: DC

## 2017-12-22 MED ORDER — ERTAPENEM IV (FOR PTA / DISCHARGE USE ONLY): 1.0000 g | INTRAVENOUS | 0 refills | Status: AC

## 2017-12-22 MED ORDER — ACETAMINOPHEN 325 MG PO TABS: 650.0000 mg | ORAL_TABLET | Freq: Four times a day (QID) | ORAL | Status: DC | PRN

## 2017-12-22 MED ORDER — HYDROCODONE-ACETAMINOPHEN 5-325 MG PO TABS: 1.0000 | ORAL_TABLET | Freq: Four times a day (QID) | ORAL | 0 refills | Status: DC | PRN

## 2017-12-22 NOTE — Progress Notes (Signed)
Pharmacy Electrolyte Monitoring Consult:  Pharmacy consulted to assist in monitoring and replacing electrolytes in this 72 y.o. female admitted on 12/17/2017 with Vomiting and Diarrhea   Labs:  Sodium (mmol/L)  Date Value  12/22/2017 139  10/11/2011 143   Potassium (mmol/L)  Date Value  12/22/2017 4.0  10/11/2011 3.4 (L)   Magnesium (mg/dL)  Date Value  96/04/540912/11/2017 2.2   Phosphorus (mg/dL)  Date Value  81/19/147811/01/2017 2.8   Calcium (mg/dL)  Date Value  29/56/213012/13/2019 9.5   Calcium, Total (mg/dL)  Date Value  86/57/846910/01/2011 10.0   Albumin (g/dL)  Date Value  62/95/284112/10/2017 2.4 (L)  10/11/2011 4.3    Assessment/Plan: 12/13 K - 4.0  No replacement needed.  Will recheck with morning labs.  Abbie Sonshris Jams Trickett, PharmD Clinical Pharmacist 12/22/2017

## 2017-12-22 NOTE — Discharge Instructions (Signed)
Follow-up with primary care physician in 3 to 5 days Follow-up with urology Dr. Javier GlazierSniinski in 2 weeks or sooner as needed

## 2017-12-22 NOTE — Progress Notes (Addendum)
Patient is medically stable for D/C to [facility] today. [facility] been received and patient can come today. RN will call report and arrange EMS for transport. Clinical Child psychotherapistocial Worker (CSW) sent D/C orders to [facility]. Patient is aware of above. CSW left patient's son Kristina Archer a Engineer, technical salesvoicemail. Please reconsult if future social work needs arise. CSW signing off.   Patient's son Kristina Archer called CSW back and is in agreement with D/C plan.   Baker Hughes IncorporatedBailey Shulem Mader, LCSW [phone]

## 2017-12-22 NOTE — Discharge Summary (Signed)
[facility] NAME: Kristina Archer    MR#:  [id number]  DATE OF BIRTH:  1945-09-25  DATE OF ADMISSION:  12/17/2017 ADMITTING PHYSICIAN: Amelia Jo, MD  DATE OF DISCHARGE: 12/22/17   PRIMARY CARE PHYSICIAN: Cletis Athens, MD    ADMISSION DIAGNOSIS:  Ureterolithiasis [N20.1] Healthcare-associated pneumonia [J18.9] Urinary tract infection with hematuria, site unspecified [N39.0, R31.9] Sepsis with acute hypoxic respiratory failure without septic shock, due to unspecified organism ([place]) [A41.9, R65.20, J96.01]  DISCHARGE DIAGNOSIS:   .Sepsis ESBL E. coli UTI Pneumonia Right ureteral stone SECONDARY DIAGNOSIS:   Past Medical History:  Diagnosis Date  . Anxiety   . Cardiomyopathy ([place])    a. 10/2017 Echo: EF 20-25%, diff HK, mild MR. Nl RV size.  . CHF (congestive heart failure) ([place])   . Coronary artery disease   . DDD (degenerative disc disease), lumbar   . Diverticulosis   . GERD (gastroesophageal reflux disease)   . High blood pressure   . High cholesterol   . History of kidney stones   . LBBB (left bundle branch block)   . Myocardial infarction ([place])   . Nephrolithiasis   . Pneumonia   . Sciatica   . Type II diabetes mellitus [facility])     HOSPITAL COURSE:  HISTORY OF PRESENT ILLNESS: Kristina Archer  is a 72 y.o. female with a known history of CKD, cardiomyopathy, hypertension, type 2 diabetes and other comorbidities. Patient was brought to emergency room for nausea, vomiting and diarrhea going on for the past 24 hours, gradually getting worse.  Per EMS report, she was found at home on her couch laying in a pool of liquid stool.  She complains of right flank pain that has been going on for the past few weeks, secondary to right ureteral stone, for which she recently underwent ureteral stent placement, few weeks ago. At the arrival to emergency room patient was noted to be lethargic and febrile with temperature at  101.8.  Initial blood pressure was 97/48.  BP improved with IV fluids. Blood test done in the emergency room are notable for elevated lactic acid level at 2.9, platelet count 136, creatinine level 1.48,  WBC 11.6, hemoglobin 9.7.  UA is positive for UTI. There is left lower lobe pneumonia noted per CT scan and right hydronephrosis still present. Is admitted for further evaluation and treatment.   Hospital course  Sepsis, likely secondary to pneumonia and UTI.  Clinically improved with broad-spectrum IV antibiotics and IV fluids.   discontinued IV fluids as patient is clinically better Procalcitonin -23.5 --9.22--3.74 today, leukocytosis is trending down and patient is afebrile clinically improving pain control. As needed Repeat chest x-ray on 12/19/2017 with improving left lower lobe pneumonia   2.HCAP, broad-spectrum IV antibiotics, oxygen and nebulizer. Repeat chest x-ray with improving left lower lobe pneumonia  3.  ESBL E. coli UTI,secondary to kidney stone.Patient is status post recent right ureteral stent placement.  Seen by urology reports that patient missed her follow-up appointment.   Recommending outpatient follow-up with urology Dr. Glori Luis after discharge regarding the kidney stone Urine culture with greater than 100,000 colonies of E. coli, culture with ESBL E. coli treated with meropenem during the hospital course Seen by ID Dr. Ola Spurr appreciate his recommendations PICC line placed today in the right upper extremity discharge patient with IV  Irtapenem  1 g once daily for 14 days with a stop date 12/30/2017   4.Diabetes type 2.Will monitor blood sugars  before meals and at bedtime and use insulin treatment during the hospital stay.  5. ARF/CKD3,likely prerenal.IV fluids and monitor kidney function closely.Avoid nephrotoxic medications. Creatinine 1.48-1.47-1.31 -1.02 baseline creatinine seems to be at 1.1 Acute renal failure  resolved  6.Hypokalemia,likely secondary to diarrhea.   potassium at 4 .  Magnesium at 2.1  7.  History of cardiomyopathy recent echocardiogram with 20 to 25% ejection fraction in October 2019 on Eliquis continue outpatient follow-up with cardiology  8.  Generalized weakness PT assessment- snf and walker with 5 inch wheels.  Offered home health with physical therapy but patient is refusing as she lives alone, very uncomfortable with home health with physical therapy and agreeable with skilled nursing care facility Received insurance authorization  9.  Insomnia patient had a bad dreams with Ambien requesting to discontinue Ambien and prefers trazodone  Disposition skilled nursing facility when patient is clinically stable [facility]   DISCHARGE CONDITIONS:   fair  CONSULTS OBTAINED:  Treatment Team:  Billey Co, MD Leonel Ramsay, MD   PROCEDURES PICC line placement on the right upper extremity  DRUG ALLERGIES:   Allergies  Allergen Reactions  . Codeine Nausea Only    DISCHARGE MEDICATIONS:   Allergies as of 12/22/2017      Reactions   Codeine Nausea Only      Medication List    STOP taking these medications   lisinopril 2.5 MG tablet Commonly known as:  PRINIVIL,ZESTRIL   oxybutynin 5 MG tablet Commonly known as:  DITROPAN   tamsulosin 0.4 MG Caps capsule Commonly known as:  FLOMAX     TAKE these medications   acetaminophen 325 MG tablet Commonly known as:  TYLENOL Take 2 tablets (650 mg total) by mouth every 6 (six) hours as needed for mild pain (or Fever >/= 101).   albuterol 108 (90 Base) MCG/ACT inhaler Commonly known as:  PROVENTIL HFA;VENTOLIN HFA Inhale 2 puffs into the lungs every 6 (six) hours as needed for wheezing or shortness of breath.   amiodarone 200 MG tablet Commonly known as:  PACERONE Take 1 tablet (200 mg total) by mouth 2 (two) times daily.   apixaban 5 MG Tabs tablet Commonly known as:  ELIQUIS Take 1  tablet (5 mg total) by mouth 2 (two) times daily.   carvedilol 3.125 MG tablet Commonly known as:  COREG Take 1 tablet (3.125 mg total) by mouth 2 (two) times daily with a meal.   DEXILANT 60 MG capsule Generic drug:  dexlansoprazole Take 60 mg by mouth daily before breakfast.   docusate sodium 100 MG capsule Commonly known as:  COLACE Take 1 capsule (100 mg total) by mouth 2 (two) times daily.   ertapenem  IVPB Commonly known as:  INVANZ Inject 1 g into the vein daily for 9 days. Indication:  ESBL UTI Last Day of Therapy:  12/30/2017 Labs - Once weekly:  CBC/D and BMP, Labs - Every other week:  ESR and CRP   feeding supplement (GLUCERNA SHAKE) Liqd Take 237 mLs by mouth 2 (two) times daily between meals.   furosemide 20 MG tablet Commonly known as:  LASIX Take 20 mg by mouth daily.   gabapentin 100 MG capsule Commonly known as:  NEURONTIN Take 100 mg by mouth 3 (three) times daily as needed (back pain).   glimepiride 2 MG tablet Commonly known as:  AMARYL Take 2 mg by mouth daily with breakfast.   HYDROcodone-acetaminophen 5-325 MG tablet Commonly known as:  NORCO/VICODIN Take 1-2 tablets  by mouth every 6 (six) hours as needed for moderate pain.   metFORMIN 500 MG 24 hr tablet Commonly known as:  GLUCOPHAGE-XR Take 500 mg by mouth 2 (two) times daily.   multivitamin with minerals Tabs tablet Take 1 tablet by mouth daily.   PARoxetine 20 MG tablet Commonly known as:  PAXIL Take 20 mg by mouth at bedtime.   rosuvastatin 40 MG tablet Commonly known as:  CRESTOR Take 1 tablet (40 mg total) by mouth daily.   sitaGLIPtin 100 MG tablet Commonly known as:  JANUVIA Take 100 mg by mouth daily.   traZODone 50 MG tablet Commonly known as:  DESYREL Take 1 tablet by mouth at bedtime.            Home Infusion Instuctions  (From admission, onward)         Start     Ordered   12/22/17 0000  Home infusion instructions    Question:  Instructions  Answer:   Flushing of vascular access device: 0.9% NaCl pre/post medication administration and prn patency; Heparin 100 u/ml, 22m for implanted ports and Heparin 10u/ml, 532mfor all other central venous catheters.   12/22/17 1149           DISCHARGE INSTRUCTIONS:   Follow-up with primary care physician in 3 to 5 days Follow-up with urology Dr. SnCherlyn Labellan 2 weeks or sooner as needed   DIET:  Diabetic diet  DISCHARGE CONDITION:  Stable  ACTIVITY:  Activity as tolerated  OXYGEN:  Home Oxygen: No.   Oxygen Delivery: room air  DISCHARGE LOCATION:  nursing home   If you experience worsening of your admission symptoms, develop shortness of breath, life threatening emergency, suicidal or homicidal thoughts you must seek medical attention immediately by calling 911 or calling your MD immediately  if symptoms less severe.  You Must read complete instructions/literature along with all the possible adverse reactions/side effects for all the Medicines you take and that have been prescribed to you. Take any new Medicines after you have completely understood and accpet all the possible adverse reactions/side effects.   Please note  You were cared for by a hospitalist during your hospital stay. If you have any questions about your discharge medications or the care you received while you were in the hospital after you are discharged, you can call the unit and asked to speak with the hospitalist on call if the hospitalist that took care of you is not available. Once you are discharged, your primary care physician will handle any further medical issues. Please note that NO REFILLS for any discharge medications will be authorized once you are discharged, as it is imperative that you return to your primary care physician (or establish a relationship with a primary care physician if you do not have one) for your aftercare needs so that they can reassess your need for medications and monitor your lab  values.     Today  Chief Complaint  Patient presents with  . Vomiting  . Diarrhea   Patient is doing much better.  PICC line placed on the right upper extremity okay to discharge patient from ID standpoint  ROS:  CONSTITUTIONAL: Denies fevers, chills. Denies any fatigue, weakness.  EYES: Denies blurry vision, double vision, eye pain. EARS, NOSE, THROAT: Denies tinnitus, ear pain, hearing loss. RESPIRATORY: Denies cough, wheeze, shortness of breath.  CARDIOVASCULAR: Denies chest pain, palpitations, edema.  GASTROINTESTINAL: Denies nausea, vomiting, diarrhea, abdominal pain. Denies bright red blood per rectum. GENITOURINARY: Denies dysuria,  hematuria. ENDOCRINE: Denies nocturia or thyroid problems. HEMATOLOGIC AND LYMPHATIC: Denies easy bruising or bleeding. SKIN: Denies rash or lesion. MUSCULOSKELETAL: Denies pain in neck, back, shoulder, knees, hips or arthritic symptoms.  NEUROLOGIC: Denies paralysis, paresthesias.  PSYCHIATRIC: Denies anxiety or depressive symptoms.   VITAL SIGNS:  Blood pressure 136/62, pulse 67, temperature 98.1 F (36.7 C), temperature source Oral, resp. rate 19, height 5' 2" (1.575 m), weight 85.2 kg, SpO2 95 %.  I/O:    Intake/Output Summary (Last 24 hours) at 12/22/2017 1149 Last data filed at 12/21/2017 1537 Gross per 24 hour  Intake 100 ml  Output -  Net 100 ml    PHYSICAL EXAMINATION:  GENERAL:  72 y.o.-year-old patient lying in the bed with no acute distress.  EYES: Pupils equal, round, reactive to light and accommodation. No scleral icterus. Extraocular muscles intact.  HEENT: Head atraumatic, normocephalic. Oropharynx and nasopharynx clear.  NECK:  Supple, no jugular venous distention. No thyroid enlargement, no tenderness.  LUNGS: Normal breath sounds bilaterally, no wheezing, rales,rhonchi or crepitation. No use of accessory muscles of respiration.  CARDIOVASCULAR: S1, S2 normal. No murmurs, rubs, or gallops.  ABDOMEN: Soft,  non-tender, non-distended. Bowel sounds present. EXTREMITIES: Right upper extremity with PICC line no pedal edema, cyanosis, or clubbing.  NEUROLOGIC: Awake, alert and oriented x3 sensation intact. Gait not checked.  PSYCHIATRIC: The patient is alert and oriented x 3.  SKIN: No obvious rash, lesion, or ulcer.   DATA REVIEW:   CBC Recent Labs  Lab 12/20/17 0017  WBC 9.6  HGB 8.7*  HCT 27.3*  PLT 181    Chemistries  Recent Labs  Lab 12/19/17 0442 12/20/17 0017  12/22/17 0508  NA 137 135  --  139  K 4.2 4.9   < > 4.0  CL 110 109  --  105  CO2 25 21*  --  27  GLUCOSE 151* 129*  --  136*  BUN 21 18  --  15  CREATININE 1.47* 1.31*  --  1.02*  CALCIUM 8.8* 9.3  --  9.5  MG 2.5* 2.2  --   --   AST 21  --   --   --   ALT 13  --   --   --   ALKPHOS 60  --   --   --   BILITOT 0.8  --   --   --    < > = values in this interval not displayed.    Cardiac Enzymes No results for input(s): TROPONINI in the last 168 hours.  Microbiology Results  Results for orders placed or performed during the hospital encounter of 12/17/17  Culture, blood (routine x 2)     Status: None   Collection Time: 12/17/17  8:57 PM  Result Value Ref Range Status   Specimen Description BLOOD RIGHT ANTECUBITAL  Final   Special Requests   Final    BOTTLES DRAWN AEROBIC AND ANAEROBIC Blood Culture results may not be optimal due to an excessive volume of blood received in culture bottles   Culture   Final    NO GROWTH 5 DAYS Performed at [facility], [address] [place], [place] [address]    Report Status 12/22/2017 FINAL  Final  Culture, blood (routine x 2)     Status: None   Collection Time: 12/17/17  8:57 PM  Result Value Ref Range Status   Specimen Description BLOOD LEFT FOREARM  Final   Special Requests   Final  BOTTLES DRAWN AEROBIC AND ANAEROBIC Blood Culture results may not be optimal due to an excessive volume of blood received in culture bottles   Culture   Final    NO  GROWTH 5 DAYS Performed at [facility], [address] [place], [place] [address]    Report Status 12/22/2017 FINAL  Final  Urine culture     Status: Abnormal   Collection Time: 12/17/17  8:57 PM  Result Value Ref Range Status   Specimen Description   Final    URINE, RANDOM Performed at [facility], [address] [place], [place] [address]    Special Requests   Final    NONE Performed at [facility], [address] [place], [place] [address]    Culture (A)  Final    >=100,000 COLONIES/mL ESCHERICHIA COLI Confirmed Extended Spectrum Beta-Lactamase Producer (ESBL).  In bloodstream infections from ESBL organisms, carbapenems are preferred over piperacillin/tazobactam. They are shown to have a lower risk of mortality.    Report Status 12/20/2017 FINAL  Final   Organism ID, Bacteria ESCHERICHIA COLI (A)  Final      Susceptibility   Escherichia coli - MIC*    AMPICILLIN >=32 RESISTANT Resistant     CEFAZOLIN >=64 RESISTANT Resistant     CEFTRIAXONE >=64 RESISTANT Resistant     CIPROFLOXACIN >=4 RESISTANT Resistant     GENTAMICIN <=1 SENSITIVE Sensitive     IMIPENEM <=0.25 SENSITIVE Sensitive     NITROFURANTOIN <=16 SENSITIVE Sensitive     TRIMETH/SULFA >=320 RESISTANT Resistant     AMPICILLIN/SULBACTAM >=32 RESISTANT Resistant     PIP/TAZO 8 SENSITIVE Sensitive     Extended ESBL POSITIVE Resistant     * >=100,000 COLONIES/mL ESCHERICHIA COLI  MRSA PCR Screening     Status: None   Collection Time: 12/18/17  3:24 PM  Result Value Ref Range Status   MRSA by PCR NEGATIVE NEGATIVE Final    Comment:        The GeneXpert MRSA Assay (FDA approved for NASAL specimens only), is one component of a comprehensive MRSA colonization surveillance program. It is not intended to diagnose MRSA infection nor to guide or monitor treatment for MRSA infections. Performed at [facility], [address] [place], [place] [address]     RADIOLOGY:   Dg Chest 2 View  Result Date: 12/19/2017 CLINICAL DATA:  Pneumonia EXAM: CHEST - 2 VIEW COMPARISON:  Portable chest x-ray of December 17, 2017 FINDINGS: The lungs are better inflated today. Patchy density in the left lower lobe is less conspicuous but has not cleared. The cardiac silhouette remains enlarged. The pulmonary vascularity is not engorged. There is calcification in the wall of the aortic arch. The bony thorax exhibits no acute abnormality. IMPRESSION: Improving left lower lobe pneumonia. A follow-up chest x-ray in 2-3 weeks is recommended to assure complete clearing. Enlargement of the cardiac silhouette without pulmonary vascular congestion or pulmonary edema. Electronically Signed   By: David  Martinique M.D.   On: 12/19/2017 13:26   Dg Chest Port 1 View  Result Date: 12/19/2017 CLINICAL DATA:  Dyspnea. EXAM: PORTABLE CHEST 1 VIEW COMPARISON:  12/19/2017. FINDINGS: Stable enlarged cardiac silhouette. Mild increase in prominence of the pulmonary vasculature and interstitial markings with some Kerley lines. Small bilateral pleural effusions. Diffuse osteopenia. IMPRESSION: Interval changes of acute congestive heart failure with stable cardiomegaly. Electronically Signed   By: Claudie Revering M.D.   On: 12/19/2017 23:40   [place] Ekg Site Rite  Result Date:  12/20/2017 If Site Rite image not attached, placement could not be confirmed due to current cardiac rhythm.   EKG:   Orders placed or performed during the hospital encounter of 11/02/17  . ED EKG  . ED EKG  . EKG 12-Lead  . EKG 12-Lead  . EKG 12-Lead  . EKG 12-Lead  . EKG 12-Lead  . EKG 12-Lead  . EKG 12-Lead  . EKG 12-Lead  . EKG 12-Lead  . EKG 12-Lead  . EKG 12-Lead  . EKG 12-Lead  . EKG 12-Lead  . EKG 12-Lead  . EKG 12-Lead  . EKG 12-Lead      Management plans discussed with the patient, patient will talk to her son and she is in agreement with the plan   CODE STATUS:     Code Status Orders  (From admission,  onward)         Start     Ordered   12/18/17 0211  Do not attempt resuscitation (DNR)  Continuous    Question Answer Comment  In the event of cardiac or respiratory ARREST Do not call a "code blue"   In the event of cardiac or respiratory ARREST Do not perform Intubation, CPR, defibrillation or ACLS   In the event of cardiac or respiratory ARREST Use medication by any route, position, wound care, and other measures to relive pain and suffering. May use oxygen, suction and manual treatment of airway obstruction as needed for comfort.   Comments NMP      12/18/17 0210        Code Status History    Date Active Date Inactive Code Status Order ID Comments User Context   11/04/2017 1129 11/13/2017 1657 DNR [id number]  Epifanio Lesches, MD Inpatient   11/02/2017 1524 11/04/2017 1129 Full Code [id number]  Epifanio Lesches, MD ED      TOTAL TIME TAKING CARE OF THIS PATIENT: 45  minutes.   Note: This dictation was prepared with Dragon dictation along with smaller phrase technology. Any transcriptional errors that result from this process are unintentional.   _0 @  on 12/22/2017 at 11:49 AM  Between 7am to 6pm - Pager - [phone]  After 6pm go to [email or web address] - password [email or web address] [facility]  Office  [phone]  CC: Primary care physician; Cletis Athens, MD

## 2017-12-22 NOTE — Clinical Social Work Placement (Addendum)
   CLINICAL SOCIAL WORK PLACEMENT  NOTE  Date:  12/22/2017  Patient Details  Name: Kristina Archer MRN: [id number] Date of Birth: 1945-05-27  Clinical Social Work is seeking post-discharge placement for this patient at the Skilled  Nursing Facility level of care (*CSW will initial, date and re-position this form in  chart as items are completed):  Yes   Patient/family provided with [facility] Clinical Social Work Department's list of facilities offering this level of care within the geographic area requested by the patient (or if unable, by the patient's family).  Yes   Patient/family informed of their freedom to choose among providers that offer the needed level of care, that participate in Medicare, Medicaid or managed care program needed by the patient, have an available bed and are willing to accept the patient.  Yes   Patient/family informed of [facility]'s ownership interest in [facility] and [facility], as well as of the fact that they are under no obligation to receive care at these facilities.  PASRR submitted to EDS on       PASRR number received on       Existing PASRR number confirmed on 12/19/17     FL2 transmitted to all facilities in geographic area requested by pt/family on 12/19/17     FL2 transmitted to all facilities within larger geographic area on       Patient informed that his/her managed care company has contracts with or will negotiate with certain facilities, including the following:        Yes   Patient/family informed of bed offers received.  Patient chooses bed at [facility] )     Physician recommends and patient chooses bed at      Patient to be transferred to [facility] ) on 12/22/17.  Patient to be transferred to facility by [facility] )     Patient family notified on 12/22/17 of transfer.  Name of family member notified:  (CSW left patient's son Toney SangRusty a Engineer, technical salesvoicemail. ) Patient's son Toney SangRusty called CSW back  and is aware of D/C today.   PHYSICIAN       Additional Comment:    _______________________________________________ Brennah Quraishi, Darleen CrockerBailey M, LCSW 12/22/2017, 12:06 PM

## 2017-12-22 NOTE — Progress Notes (Signed)
Report called to [employer] EdisonEmendeb, Charity fundraiserN at [facility]

## 2017-12-22 NOTE — Progress Notes (Signed)
Peripherally Inserted Central Catheter/Midline Placement  The IV Nurse has discussed with the patient and/or persons authorized to consent for the patient, the purpose of this procedure and the potential benefits and risks involved with this procedure.  The benefits include less needle sticks, lab draws from the catheter, and the patient may be discharged home with the catheter. Risks include, but not limited to, infection, bleeding, blood clot (thrombus formation), and puncture of an artery; nerve damage and irregular heartbeat and possibility to perform a PICC exchange if needed/ordered by physician.  Alternatives to this procedure were also discussed.  Bard Power PICC patient education guide, fact sheet on infection prevention and patient information card has been provided to patient /or left at bedside.    PICC/Midline Placement Documentation  PICC Single Lumen 12/22/17 PICC Right Brachial 37 cm 1 cm (Active)  Indication for Insertion or Continuance of Line Prolonged intravenous therapies 12/22/2017 10:45 AM  Exposed Catheter (cm) 1 cm 12/22/2017 10:45 AM  Site Assessment Clean;Dry;Intact 12/22/2017 10:45 AM  Line Status Flushed;Blood return noted 12/22/2017 10:45 AM  Dressing Type Transparent 12/22/2017 10:45 AM  Dressing Status Clean;Dry;Intact 12/22/2017 10:45 AM  Dressing Change Due 12/29/17 12/22/2017 10:45 AM       Audrie GallusByerly, Sharae Zappulla Ramos 12/22/2017, 11:08 AM

## 2017-12-22 NOTE — Progress Notes (Signed)
[facility] CLINIC INFECTIOUS DISEASE PROGRESS NOTE Date of Admission:  12/17/2017     ID: Kristina Archer is a 72 y.o. female with ESBL E coli.  Active Problems:   Ureterolithiasis   Sepsis (HCC)   Urinary tract infection with hematuria   Pneumonia of both lower lobes due to infectious organism [facility])  Subjective: Low grade fever but denies any new complaints.  ROS  Eleven systems are reviewed and negative except per hpi  Medications:  Antibiotics Given (last 72 hours)    Date/Time Action Medication Dose Rate   12/19/17 1412 New Bag/Given   meropenem (MERREM) 1 g in sodium chloride 0.9 % 100 mL IVPB 1 g 200 mL/hr   12/20/17 0124 New Bag/Given   meropenem (MERREM) 1 g in sodium chloride 0.9 % 100 mL IVPB 1 g 200 mL/hr   12/20/17 1509 New Bag/Given   meropenem (MERREM) 1 g in sodium chloride 0.9 % 100 mL IVPB 1 g 200 mL/hr   12/21/17 0100 New Bag/Given   meropenem (MERREM) 1 g in sodium chloride 0.9 % 100 mL IVPB 1 g 200 mL/hr   12/21/17 1537 New Bag/Given   ertapenem (INVANZ) 1,000 mg in sodium chloride 0.9 % 100 mL IVPB 1,000 mg 200 mL/hr     . apixaban  5 mg Oral BID  . carvedilol  3.125 mg Oral BID WC  . docusate sodium  100 mg Oral BID  . feeding supplement (GLUCERNA SHAKE)  237 mL Oral BID BM  . furosemide  20 mg Oral Daily  . insulin aspart  0-15 Units Subcutaneous TID WC  . insulin aspart  0-5 Units Subcutaneous QHS  . insulin glargine  12 Units Subcutaneous QHS  . multivitamin with minerals  1 tablet Oral Daily  . pantoprazole  40 mg Oral Daily  . PARoxetine  20 mg Oral QHS  . rosuvastatin  40 mg Oral Daily  . traZODone  50 mg Oral QHS    Objective: Vital signs in last 24 hours: Temp:  [98.1 F (36.7 C)-99.6 F (37.6 C)] 98.1 F (36.7 C) (12/13 0801) Pulse Rate:  [67-71] 67 (12/13 0801) Resp:  [19-20] 19 (12/12 2320) BP: (113-136)/(48-62) 136/62 (12/13 0801) SpO2:  [94 %-96 %] 95 % (12/13 0801) Constitutional:  oriented to person, place, and time. appears  well-developed and well-nourished. No distress.  HENT: [place]/AT, PERRLA, no scleral icterus Mouth/Throat: Oropharynx is clear and moist. No oropharyngeal exudate.  Cardiovascular: Normal rate, regular rhythm  Pulmonary/Chest: bibasilar rhonchi Neck = supple, no nuchal rigidity Abdominal: Soft. Bowel sounds are normal.  exhibits no distension. There is no tenderness.  Lymphadenopathy: no cervical adenopathy. No axillary adenopathy Neurological: alert and oriented to person, place, and time.  Skin: Skin is warm and dry. No rash noted. No erythema.  Psychiatric: a normal mood and affect.  behavior is normal.   Lab Results Recent Labs    12/20/17 0017 12/21/17 0730 12/22/17 0508  WBC 9.6  --   --   HGB 8.7*  --   --   HCT 27.3*  --   --   NA 135  --  139  K 4.9 3.8 4.0  CL 109  --  105  CO2 21*  --  27  BUN 18  --  15  CREATININE 1.31*  --  1.02*    Microbiology: @micro @ Studies/Results: [place] Ekg Site Rite  Result Date: 12/20/2017 If Site Rite image not attached, placement could not be confirmed due to current cardiac rhythm.  Assessment/Plan: Kristina Archer is a 72 y.o. female with multiple medical problems including long hx of nephrolithiasis and recurrent UTIs and recent admission 10/24-11/4 with obstructive hydronephrosis and ESBL E coli infection. Now readmitted with NV diarrhea, sepsis, and recurrent UTI as well as Pneumonia.  Clinically much improved with meropenem. Stent seems well placed per urology based on CT imaging. Will be sent to SNF for PT. 12/12 - low grade fever but otherwise improving 12/13 no fevers, Recommendations Place PICC today IV ertapenem 1 gm QD for 14 total day abx course- stop date 12/21 After that can remove picc and agree to start macrobid suppressive therapy 100 qd. Would try to expediate treatment of the stones and try to remove stents and stones ASAP as infection likely to have seeded these and will likely recur if definitive treatment not  performed.  No need for ID outpatient follow up. See Abx order sheet. Thank you very much for the consult. Will follow with you.  Mick SellDavid P Jeffrie Stander   12/22/2017, 8:22 AM

## 2017-12-26 ENCOUNTER — Other Ambulatory Visit: Payer: Self-pay

## 2017-12-26 DIAGNOSIS — N390 Urinary tract infection, site not specified: Secondary | ICD-10-CM | POA: Diagnosis not present

## 2017-12-26 DIAGNOSIS — Z1612 Extended spectrum beta lactamase (ESBL) resistance: Secondary | ICD-10-CM | POA: Diagnosis not present

## 2017-12-26 DIAGNOSIS — A498 Other bacterial infections of unspecified site: Secondary | ICD-10-CM | POA: Diagnosis not present

## 2017-12-26 DIAGNOSIS — J189 Pneumonia, unspecified organism: Secondary | ICD-10-CM | POA: Diagnosis not present

## 2017-12-26 NOTE — Patient Outreach (Signed)
[facility]) Care Management  12/26/2017  Kristina MooreBrenda A Archer [birth year] [id number]     Transition of Care Referral  Referral Date: 12/26/17 Referral [facility]: [facility] Dis[facility] Report Date of Admission: 12/17/17 Diagnosis:sepsis, UTI, PNA Date of Discharge: 12/22/17 Facility: [facility]  Insurance: [facility]    Referral received. Upon chart review, noted that patient was discharged from hospital on 12/22/17 and transferred to SNF for rehab[facility].    Plan: RN CM will close case at this time as patient remains in SNF.    Antionette Fairyoshanda Kalani Sthilaire, RN,BSN,CCM [facility] Care Management Telephonic Care Management Coordinator Direct Phone: [phone] Toll Free: [phone] Fax: [phone]

## 2018-01-01 ENCOUNTER — Other Ambulatory Visit: Payer: Self-pay | Admitting: Radiology

## 2018-01-01 ENCOUNTER — Telehealth: Payer: Self-pay | Admitting: Radiology

## 2018-01-01 ENCOUNTER — Ambulatory Visit (INDEPENDENT_AMBULATORY_CARE_PROVIDER_SITE_OTHER): Payer: Medicare HMO | Admitting: Urology

## 2018-01-01 ENCOUNTER — Encounter: Payer: Self-pay | Admitting: Urology

## 2018-01-01 ENCOUNTER — Telehealth: Payer: Self-pay | Admitting: Cardiovascular Disease

## 2018-01-01 VITALS — BP 148/74 | HR 73 | Ht 62.0 in | Wt 180.8 lb

## 2018-01-01 DIAGNOSIS — N201 Calculus of ureter: Secondary | ICD-10-CM | POA: Diagnosis not present

## 2018-01-01 DIAGNOSIS — N200 Calculus of kidney: Secondary | ICD-10-CM

## 2018-01-01 LAB — MICROSCOPIC EXAMINATION
RBC, UA: >30 /hpf — ABNORMAL HIGH (ref 0–2)
WBC, UA: >30 /hpf — ABNORMAL HIGH (ref 0–5)

## 2018-01-01 LAB — URINALYSIS, COMPLETE
Bilirubin, UA: NEGATIVE
Glucose, UA: NEGATIVE
Ketones, UA: NEGATIVE
NITRITE UA: NEGATIVE
Specific Gravity, UA: >1.03 — ABNORMAL HIGH (ref 1.005–1.030)
UUROB: 0.2 mg/dL (ref 0.2–1.0)
pH, UA: 6 (ref 5.0–7.5)

## 2018-01-01 NOTE — Telephone Encounter (Signed)
Patient was given the [facility] Surgery Information form below as well as the Instructions for Pre-Admission Testing form & a map of [facility].   [address] [facility], [address] [place], [place] [address] Telephone: [phone]([phone] Fax: [phone]   Thank you for choosing [facility] for your upcoming surgery!  We are always here to assist in your urological needs.  Please read the following information with specific details for your upcoming appointments related to your surgery. Please contact Saatvik Thielman at [phone] Option 3 with any questions.  The Name of Your Surgery: Right ureteroscopy, laser lithotripsy, stone removal, right ureteral stent exchange Your Surgery Date: 01/22/2018 Your Surgeon: Vanna ScotlandAshley Brandon  Please call Same Day Surgery at [phone] between the hours of 1pm-3pm one day prior to your surgery. They will inform you of the time to arrive at Same Day Surgery which is located on the second floor of the [facility].   Please refer to the attached letter regarding instructions for Pre-Admission Testing. You will receive a call from the Pre-Admission Testing office regarding your appointment with them.  The Pre-Admission Testing office is located at [address], on the first floor of the [facility] Arts Center at [facility] in [address] (office is to the right as you enter through the [facility]). Please have all medications you are currently taking and your insurance card available.   Patient was advised to have nothing to eat or drink after midnight the night prior to surgery except that she may have only water until 2 hours before surgery with nothing to drink within 2 hours of surgery.  The patient states she currently takes Eliquis & was informed that she will be notified when to hold medication after being seen by cardiologist.  Patient's questions were answered and she expressed understanding of these instructions.

## 2018-01-01 NOTE — Progress Notes (Signed)
01/01/2018 12:43 PM   Kristina Archer 07/11/1945 [id number]  Referring provider: Corky DownsMasoud, [name], MD [address] [place], [place] [address]  Chief Complaint  Patient presents with  . Follow-up    Hospital follow up    HPI: 72 year old female presents for hospital follow-up.  She has a history of recurrent stone disease previously managed by Dr. [name].  She was admitted on 11/02/2017 with an obstructing right proximal ureteral calculus associated with ESBL E. coli urosepsis and underwent emergent stent placement by Dr. [name].  After discharge she did not keep her urologic follow-up.  She was admitted on 12/9 with fever to 101.8 degrees, nausea and back pain concerning for sepsis from either a urinary source or pneumonia.  CT during that hospitalization showed good position of her ureteral stent and a distended bladder which was managed with Foley catheter drainage.  Blood cultures during that hospitalization were negative and urine culture grew ESBL E. Coli.  She has significant comorbidities including cardiomyopathy, CHF, coronary artery disease with prior MI.  She presents today to schedule definitive stone treatment.  She also has a 5 mm nonobstructing right lower pole calculus.   PMH: Past Medical History:  Diagnosis Date  . Anxiety   . Cardiomyopathy (HCC)    a. 10/2017 Echo: EF 20-25%, diff HK, mild MR. Nl RV size.  . CHF (congestive heart failure) (HCC)   . Coronary artery disease   . DDD (degenerative disc disease), lumbar   . Diverticulosis   . GERD (gastroesophageal reflux disease)   . High blood pressure   . High cholesterol   . History of kidney stones   . LBBB (left bundle branch block)   . Myocardial infarction (HCC)   . Nephrolithiasis   . Pneumonia   . Sciatica   . Type II diabetes mellitus (HCC)     Surgical History: Past Surgical History:  Procedure Laterality Date  . ABDOMINAL HYSTERECTOMY    . CHOLECYSTECTOMY    . CYSTOSCOPY W/ RETROGRADES Bilateral  05/24/2017   Procedure: CYSTOSCOPY WITH RETROGRADE PYELOGRAM;  Surgeon: [name], [name], MD;  Location: [facility] ORS;  Service: Urology;  Laterality: Bilateral;  . CYSTOSCOPY W/ URETERAL STENT PLACEMENT Right 11/02/2017   Procedure: CYSTOSCOPY WITH RETROGRADE PYELOGRAM/URETERAL STENT PLACEMENT;  Surgeon: [name], [name], MD;  Location: [facility] ORS;  Service: Urology;  Laterality: Right;  . CYSTOSCOPY/URETEROSCOPY/HOLMIUM LASER/STENT PLACEMENT Left 05/24/2017   Procedure: CYSTOSCOPY/URETEROSCOPY/HOLMIUM LASER/STENT PLACEMENT;  Surgeon: [name], [name], MD;  Location: [facility] ORS;  Service: Urology;  Laterality: Left;    Home Medications:  Allergies as of 01/01/2018      Reactions   Codeine Nausea Only      Medication List       Accurate as of January 01, 2018 12:43 PM. Always use your most recent med list.        acetaminophen 325 MG tablet Commonly known as:  TYLENOL Take 2 tablets (650 mg total) by mouth every 6 (six) hours as needed for mild pain (or Fever >/= 101).   albuterol 108 (90 Base) MCG/ACT inhaler Commonly known as:  PROVENTIL HFA;VENTOLIN HFA Inhale 2 puffs into the lungs every 6 (six) hours as needed for wheezing or shortness of breath.   amiodarone 200 MG tablet Commonly known as:  PACERONE Take 1 tablet (200 mg total) by mouth 2 (two) times daily.   apixaban 5 MG Tabs tablet Commonly known as:  ELIQUIS Take 1 tablet (5 mg total) by mouth 2 (two) times daily.   carvedilol  3.125 MG tablet Commonly known as:  COREG Take 1 tablet (3.125 mg total) by mouth 2 (two) times daily with a meal.   DEXILANT 60 MG capsule Generic drug:  dexlansoprazole Take 60 mg by mouth daily before breakfast.   docusate sodium 100 MG capsule Commonly known as:  COLACE Take 1 capsule (100 mg total) by mouth 2 (two) times daily.   feeding supplement (GLUCERNA SHAKE) Liqd Take 237 mLs by mouth 2 (two) times daily between meals.   furosemide 20 MG tablet Commonly known as:  LASIX Take  20 mg by mouth daily.   glimepiride 2 MG tablet Commonly known as:  AMARYL Take 2 mg by mouth daily with breakfast.   HYDROcodone-acetaminophen 5-325 MG tablet Commonly known as:  NORCO/VICODIN Take 1-2 tablets by mouth every 6 (six) hours as needed for moderate pain.   metFORMIN 500 MG 24 hr tablet Commonly known as:  GLUCOPHAGE-XR Take 500 mg by mouth 2 (two) times daily.   multivitamin with minerals Tabs tablet Take 1 tablet by mouth daily.   PARoxetine 20 MG tablet Commonly known as:  PAXIL Take 20 mg by mouth at bedtime.   rosuvastatin 40 MG tablet Commonly known as:  CRESTOR Take 1 tablet (40 mg total) by mouth daily.   sitaGLIPtin 100 MG tablet Commonly known as:  JANUVIA Take 100 mg by mouth daily.   traZODone 50 MG tablet Commonly known as:  DESYREL Take 1 tablet by mouth at bedtime.       Allergies:  Allergies  Allergen Reactions  . Codeine Nausea Only    Family History: Family History  Problem Relation Age of Onset  . Heart attack Father [id number]  . Bladder Cancer Neg Hx   . Kidney cancer Neg Hx     Social History:  reports that she quit smoking about 20 years ago. Her smoking use included cigarettes. She has a 22.50 pack-year smoking history. She has never used smokeless tobacco. She reports previous alcohol use. She reports that she does not use drugs.  ROS: UROLOGY Frequent Urination?: Yes Hard to postpone urination?: No Burning/pain with urination?: No Get up at night to urinate?: Yes Leakage of urine?: Yes Urine stream starts and stops?: No Trouble starting stream?: No Do you have to strain to urinate?: No Blood in urine?: Yes Urinary tract infection?: No Sexually transmitted disease?: No Injury to kidneys or bladder?: No Painful intercourse?: No Weak stream?: No Currently pregnant?: No Vaginal bleeding?: No Last menstrual period?: n  Gastrointestinal Nausea?: No Vomiting?: No Indigestion/heartburn?: No Diarrhea?:  No Constipation?: No  Constitutional Fever: No Night sweats?: No Weight loss?: Yes Fatigue?: Yes  Skin Skin rash/lesions?: No Itching?: No  Eyes Blurred vision?: No Double vision?: No  Ears/Nose/Throat Sore throat?: No Sinus problems?: No  Hematologic/Lymphatic Swollen glands?: No Easy bruising?: No  Cardiovascular Leg swelling?: No Chest pain?: No  Respiratory Cough?: No Shortness of breath?: No  Endocrine Excessive thirst?: Yes  Musculoskeletal Back pain?: Yes Joint pain?: No  Neurological Headaches?: No Dizziness?: No  Psychologic Depression?: No Anxiety?: Yes  Physical Exam: BP (!) 148/74 (BP Location: Left Arm, Patient Position: Sitting, Cuff Size: Normal)   Pulse 73   Ht 5\' 2"  (1.575 m)   Wt 180 lb 12.8 oz (82 kg)   BMI 33.07 kg/m   Constitutional:  Alert and oriented, No acute distress. HEENT: [place] AT, moist mucus membranes.  Trachea midline, no masses. Cardiovascular: No clubbing, cyanosis, or edema.  RRR Respiratory: Normal respiratory effort, no increased  work of breathing.  Lungs clear GI: Abdomen is soft, nontender, nondistended, no abdominal masses GU: No CVA tenderness Lymph: No cervical or inguinal lymphadenopathy. Skin: No rashes, bruises or suspicious lesions. Neurologic: Grossly intact, no focal deficits, moving all 4 extremities. Psychiatric: Normal mood and affect.     Assessment & Plan:   72 year old female with a history of an obstructing 10 mm right ureteral calculus status post stent placement and an 8 mm right lower pole calculus.  She has had previous ureteroscopic procedures and desires to schedule right ureteroscopy with laser lithotripsy/stone removal of her right sided calculi.  She would like to be scheduled with Dr. Apolinar Junes.  The indications and nature of the planned procedure were discussed as well as the potential  benefits and expected outcome.  Alternatives have been discussed in detail. The most common  complications and side effects were discussed including but not limited to infection/sepsis; blood loss; damage to urethra, bladder, ureter, kidney; need for multiple surgeries; need for prolonged stent placement as well as general anesthesia risks. All of her questions were answered and she desires to proceed.  At the time of our visit she did not appear to be distracted or in pain.  I recommended preoperative cardiology clearance.  Urine culture was ordered   Riki Altes, MD  [facility] [address], [address] [place], [place] [address] [phone]

## 2018-01-01 NOTE — H&P (View-Only) (Signed)
 01/01/2018 12:43 PM   Erinn A Goris 06/14/1945 [id number]  Referring provider: Masoud, [name], MD [address] [place], [place] [address]  Chief Complaint  Patient presents with  . Follow-up    Hospital follow up    HPI: 72-year-old female presents for hospital follow-up.  She has a history of recurrent stone disease previously managed by Dr. Brandon.  She was admitted on 11/02/2017 with an obstructing right proximal ureteral calculus associated with ESBL E. coli urosepsis and underwent emergent stent placement by Dr. Bell.  After discharge she did not keep her urologic follow-up.  She was admitted on 12/9 with fever to 101.8 degrees, nausea and back pain concerning for sepsis from either a urinary source or pneumonia.  CT during that hospitalization showed good position of her ureteral stent and a distended bladder which was managed with Foley catheter drainage.  Blood cultures during that hospitalization were negative and urine culture grew ESBL E. Coli.  She has significant comorbidities including cardiomyopathy, CHF, coronary artery disease with prior MI.  She presents today to schedule definitive stone treatment.  She also has a 5 mm nonobstructing right lower pole calculus.   PMH: Past Medical History:  Diagnosis Date  . Anxiety   . Cardiomyopathy (HCC)    a. 10/2017 Echo: EF 20-25%, diff HK, mild MR. Nl RV size.  . CHF (congestive heart failure) (HCC)   . Coronary artery disease   . DDD (degenerative disc disease), lumbar   . Diverticulosis   . GERD (gastroesophageal reflux disease)   . High blood pressure   . High cholesterol   . History of kidney stones   . LBBB (left bundle branch block)   . Myocardial infarction (HCC)   . Nephrolithiasis   . Pneumonia   . Sciatica   . Type II diabetes mellitus (HCC)     Surgical History: Past Surgical History:  Procedure Laterality Date  . ABDOMINAL HYSTERECTOMY    . CHOLECYSTECTOMY    . CYSTOSCOPY W/ RETROGRADES Bilateral  05/24/2017   Procedure: CYSTOSCOPY WITH RETROGRADE PYELOGRAM;  Surgeon: Brandon, [name], MD;  Location: [facility] ORS;  Service: Urology;  Laterality: Bilateral;  . CYSTOSCOPY W/ URETERAL STENT PLACEMENT Right 11/02/2017   Procedure: CYSTOSCOPY WITH RETROGRADE PYELOGRAM/URETERAL STENT PLACEMENT;  Surgeon: Bell, [name], MD;  Location: [facility] ORS;  Service: Urology;  Laterality: Right;  . CYSTOSCOPY/URETEROSCOPY/HOLMIUM LASER/STENT PLACEMENT Left 05/24/2017   Procedure: CYSTOSCOPY/URETEROSCOPY/HOLMIUM LASER/STENT PLACEMENT;  Surgeon: Brandon, [name], MD;  Location: [facility] ORS;  Service: Urology;  Laterality: Left;    Home Medications:  Allergies as of 01/01/2018      Reactions   Codeine Nausea Only      Medication List       Accurate as of January 01, 2018 12:43 PM. Always use your most recent med list.        acetaminophen 325 MG tablet Commonly known as:  TYLENOL Take 2 tablets (650 mg total) by mouth every 6 (six) hours as needed for mild pain (or Fever >/= 101).   albuterol 108 (90 Base) MCG/ACT inhaler Commonly known as:  PROVENTIL HFA;VENTOLIN HFA Inhale 2 puffs into the lungs every 6 (six) hours as needed for wheezing or shortness of breath.   amiodarone 200 MG tablet Commonly known as:  PACERONE Take 1 tablet (200 mg total) by mouth 2 (two) times daily.   apixaban 5 MG Tabs tablet Commonly known as:  ELIQUIS Take 1 tablet (5 mg total) by mouth 2 (two) times daily.   carvedilol   3.125 MG tablet Commonly known as:  COREG Take 1 tablet (3.125 mg total) by mouth 2 (two) times daily with a meal.   DEXILANT 60 MG capsule Generic drug:  dexlansoprazole Take 60 mg by mouth daily before breakfast.   docusate sodium 100 MG capsule Commonly known as:  COLACE Take 1 capsule (100 mg total) by mouth 2 (two) times daily.   feeding supplement (GLUCERNA SHAKE) Liqd Take 237 mLs by mouth 2 (two) times daily between meals.   furosemide 20 MG tablet Commonly known as:  LASIX Take  20 mg by mouth daily.   glimepiride 2 MG tablet Commonly known as:  AMARYL Take 2 mg by mouth daily with breakfast.   HYDROcodone-acetaminophen 5-325 MG tablet Commonly known as:  NORCO/VICODIN Take 1-2 tablets by mouth every 6 (six) hours as needed for moderate pain.   metFORMIN 500 MG 24 hr tablet Commonly known as:  GLUCOPHAGE-XR Take 500 mg by mouth 2 (two) times daily.   multivitamin with minerals Tabs tablet Take 1 tablet by mouth daily.   PARoxetine 20 MG tablet Commonly known as:  PAXIL Take 20 mg by mouth at bedtime.   rosuvastatin 40 MG tablet Commonly known as:  CRESTOR Take 1 tablet (40 mg total) by mouth daily.   sitaGLIPtin 100 MG tablet Commonly known as:  JANUVIA Take 100 mg by mouth daily.   traZODone 50 MG tablet Commonly known as:  DESYREL Take 1 tablet by mouth at bedtime.       Allergies:  Allergies  Allergen Reactions  . Codeine Nausea Only    Family History: Family History  Problem Relation Age of Onset  . Heart attack Father 49  . Bladder Cancer Neg Hx   . Kidney cancer Neg Hx     Social History:  reports that she quit smoking about 20 years ago. Her smoking use included cigarettes. She has a 22.50 pack-year smoking history. She has never used smokeless tobacco. She reports previous alcohol use. She reports that she does not use drugs.  ROS: UROLOGY Frequent Urination?: Yes Hard to postpone urination?: No Burning/pain with urination?: No Get up at night to urinate?: Yes Leakage of urine?: Yes Urine stream starts and stops?: No Trouble starting stream?: No Do you have to strain to urinate?: No Blood in urine?: Yes Urinary tract infection?: No Sexually transmitted disease?: No Injury to kidneys or bladder?: No Painful intercourse?: No Weak stream?: No Currently pregnant?: No Vaginal bleeding?: No Last menstrual period?: n  Gastrointestinal Nausea?: No Vomiting?: No Indigestion/heartburn?: No Diarrhea?:  No Constipation?: No  Constitutional Fever: No Night sweats?: No Weight loss?: Yes Fatigue?: Yes  Skin Skin rash/lesions?: No Itching?: No  Eyes Blurred vision?: No Double vision?: No  Ears/Nose/Throat Sore throat?: No Sinus problems?: No  Hematologic/Lymphatic Swollen glands?: No Easy bruising?: No  Cardiovascular Leg swelling?: No Chest pain?: No  Respiratory Cough?: No Shortness of breath?: No  Endocrine Excessive thirst?: Yes  Musculoskeletal Back pain?: Yes Joint pain?: No  Neurological Headaches?: No Dizziness?: No  Psychologic Depression?: No Anxiety?: Yes  Physical Exam: BP (!) 148/74 (BP Location: Left Arm, Patient Position: Sitting, Cuff Size: Normal)   Pulse 73   Ht 5' 2" (1.575 m)   Wt 180 lb 12.8 oz (82 kg)   BMI 33.07 kg/m   Constitutional:  Alert and oriented, No acute distress. HEENT: [place] AT, moist mucus membranes.  Trachea midline, no masses. Cardiovascular: No clubbing, cyanosis, or edema.  RRR Respiratory: Normal respiratory effort, no increased   work of breathing.  Lungs clear GI: Abdomen is soft, nontender, nondistended, no abdominal masses GU: No CVA tenderness Lymph: No cervical or inguinal lymphadenopathy. Skin: No rashes, bruises or suspicious lesions. Neurologic: Grossly intact, no focal deficits, moving all 4 extremities. Psychiatric: Normal mood and affect.     Assessment & Plan:   72-year-old female with a history of an obstructing 10 mm right ureteral calculus status post stent placement and an 8 mm right lower pole calculus.  She has had previous ureteroscopic procedures and desires to schedule right ureteroscopy with laser lithotripsy/stone removal of her right sided calculi.  She would like to be scheduled with Dr. Brandon.  The indications and nature of the planned procedure were discussed as well as the potential  benefits and expected outcome.  Alternatives have been discussed in detail. The most common  complications and side effects were discussed including but not limited to infection/sepsis; blood loss; damage to urethra, bladder, ureter, kidney; need for multiple surgeries; need for prolonged stent placement as well as general anesthesia risks. All of her questions were answered and she desires to proceed.  At the time of our visit she did not appear to be distracted or in pain.  I recommended preoperative cardiology clearance.  Urine culture was ordered   Scott C Stoioff, MD  [facility] [address], [address] [place], [place] [address] [phone]  

## 2018-01-01 NOTE — Telephone Encounter (Signed)
° °  [facility] Pre-operative Risk Assessment    Request for surgical clearance:  1. What type of surgery is being performed? Right ureteroscopy, laser lithotripsy, ureteral stent exchange    2. When is this surgery scheduled? 01/22/2018   3. What type of clearance is required (medical clearance vs. Pharmacy clearance to hold med vs. Both)? both  4. Are there any medications that need to be held prior to surgery and how long? Hold Eliquis 2 days prior to surgery    5. Practice name and name of physician performing surgery? Hollice Espy, [facility]    6. What is your office phone number [phone]    7.   What is your office fax number [phone]  8.   Anesthesia type (None, local, MAC, general) ?    Caryl Pina Gerringer 01/01/2018, 4:10 PM  _________________________________________________________________   (provider comments below)

## 2018-01-01 NOTE — Telephone Encounter (Signed)
Patient has not been seen in clinic. She was contacted on 11/09/17 for a TCM call and was unsure if she wanted to keep her follow up appointment.   She has an appointment 01/11/18 with Eula Listenyan Dunn, PA.  She will need to keep that appointment for her clearance to be addressed.   [facility] upcoming appointment.

## 2018-01-02 ENCOUNTER — Other Ambulatory Visit: Payer: Self-pay

## 2018-01-02 DIAGNOSIS — F419 Anxiety disorder, unspecified: Secondary | ICD-10-CM | POA: Diagnosis not present

## 2018-01-02 DIAGNOSIS — I4891 Unspecified atrial fibrillation: Secondary | ICD-10-CM | POA: Diagnosis not present

## 2018-01-02 DIAGNOSIS — N132 Hydronephrosis with renal and ureteral calculous obstruction: Secondary | ICD-10-CM | POA: Diagnosis not present

## 2018-01-02 DIAGNOSIS — I110 Hypertensive heart disease with heart failure: Secondary | ICD-10-CM | POA: Diagnosis not present

## 2018-01-02 DIAGNOSIS — I5020 Unspecified systolic (congestive) heart failure: Secondary | ICD-10-CM | POA: Diagnosis not present

## 2018-01-02 DIAGNOSIS — M6281 Muscle weakness (generalized): Secondary | ICD-10-CM | POA: Diagnosis not present

## 2018-01-02 DIAGNOSIS — E785 Hyperlipidemia, unspecified: Secondary | ICD-10-CM | POA: Diagnosis not present

## 2018-01-02 DIAGNOSIS — Z48816 Encounter for surgical aftercare following surgery on the genitourinary system: Secondary | ICD-10-CM | POA: Diagnosis not present

## 2018-01-02 DIAGNOSIS — J449 Chronic obstructive pulmonary disease, unspecified: Secondary | ICD-10-CM | POA: Diagnosis not present

## 2018-01-02 DIAGNOSIS — E119 Type 2 diabetes mellitus without complications: Secondary | ICD-10-CM | POA: Diagnosis not present

## 2018-01-02 DIAGNOSIS — R269 Unspecified abnormalities of gait and mobility: Secondary | ICD-10-CM | POA: Diagnosis not present

## 2018-01-02 NOTE — Patient Outreach (Signed)
[facility] Care Management  01/02/2018  Eustace MooreBrenda A Tonelli Jul 15, 1945 [id number]   Referral Date: 01/02/18 Referral Source: [employer] Date of Admission: 12/22/17 Diagnosis: Sepsis Date of Discharge: 12/30/17 [facility]: [facility]  Outreach attempt:spoke with patient. She states she feels some better since being at home. She states that she lives alone but has a son that assists her at times.  Patient states she went to the rehab center due to her PICC line and she could not care for it herself.  [facility] and will be coming today.  She states that the nurse was going to see if her PICC line could come out today.  Patient seen by urologist on yesterday and she states she has a kidney stone that will be removed in January.  She states she has had them before but this one has been so different.  However, she says she feels much better with some back pain from the stone.  Patient states she is taking her medications as prescribed but is not able to review.  Discussed with patient kidney stones and signs of infection and getting help immediately.  She verbalized understanding and voices no needs.    Plan: RN CM will close case.  Bary Lericheionne J Garner Dullea, RN, MSN [facility] Care [facility] Care Management Coordinator Direct Line [phone] Toll Free: [phone]  Fax: [phone]

## 2018-01-04 LAB — CULTURE, URINE COMPREHENSIVE

## 2018-01-05 NOTE — Progress Notes (Signed)
Cardiology Office Note Date:  01/09/2018  Patient ID:  Kristina Archer, DOB August 28, 1945, MRN [id number] PCP:  Corky DownsMasoud, [name], MD  Cardiologist:  Dr. Mariah MillingGollan, MD    Chief Complaint: Hospital follow-up  History of Present Illness: Kristina MooreBrenda A Archer is a 72 y.o. female with history of HFrEF presumed to be tachycardia mediated, recently diagnosed PAF on Eliquis, SVT, LBBB, anemia, ureteral stone, DM2, HTN, and HLD who presents for hospital follow-up as below.  Prior to her admission in 10/2017, the patient did not have any previously known cardiac history.  [facility] though we do not have access to those records.  Patient was admitted to the hospital in 10/2017 with severe right flank pain and was found to have a 10 x 13 mm right UPJ stone on CT of the abdomen/pelvis.  The patient did not report chest pain though troponin was checked and found to be elevated initially at 0.09, ultimately peaking at 0.80.  Chest x-ray showed pulmonary edema.  EKG upon arrival showed SVT, 138 bpm.  She was given adenosine with conversion to sinus rhythm.  Echo on 11/03/2017 showed an EF of 20 to 25%, diffuse hypokinesis, mild mitral regurgitation, left atrium normal in size, RVSF normal, unable to estimate PASP.  Rhythm during echo was WCT with rate of 129 bpm.  She was IV diuresed.  She was managed with IV heparin for 48 hours with recommendation for right and left cardiac cath.  However, the patient preferred to defer cardiac cath during her admission and wanted to discuss this further with her primary care physician.  During her admission, she developed new onset A. fib with RVR with a baseline left bundle.  In this setting, she was started on amiodarone infusion and heparin drip was continued.  She was ultimately transitioned to p.o. amiodarone and Eliquis.  She did not follow-up with cardiology as an outpatient.  She was subsequently readmitted to the hospital from 12/8  through 12/13 for sepsis in the setting of UTI and pneumonia.  Apparently, the patient was found laying down on her couch in a pool of liquid stool.  She continued to note right flank pain.  She was treated for both pneumonia and UTI with broad-spectrum IV antibiotics per internal medicine.  She was recommended to follow-up with cardiology as an outpatient given her cardiomyopathy.  Troponin and EKG were not checked during the second admission.  Discharge labs: Potassium 4.0, serum creatinine 1.02, WBC 9.6, hemoglobin 8.7 (baseline ~ 11), magnesium 2.2, albumin 2.4, AST/ALT normal  She comes in doing well from a cardiac perspective.  She denies any symptoms concerning for angina, shortness of breath, palpitations, dizziness, presyncope, or syncope.  She continues to note she is fatigued though feels like her energy and appetite are steadily improving.  She has a stable 2 pillow orthopnea.  She denies any lower extremity swelling, abdominal distention, PND, or early satiety.  She has not had any falls since she was last discharged.  She denies any BRBPR or melena.  She continues to have flank pain in the setting of nephrolithiasis.  She is anxious to get her procedure done on 01/22/2018.  She reports compliance with medications including amiodarone, Eliquis, Coreg, Lasix, and Crestor.  She is able to achieve at least 7.2 METs per the [facility] Activity Status Index.  Past Medical History:  Diagnosis Date  . Anxiety   . Cardiomyopathy (HCC)    a. 10/2017 Echo: EF 20-25%,  diff HK, mild MR. Nl RV size.  . DDD (degenerative disc disease), lumbar   . Diverticulosis   . Elevated troponin   . Essential hypertension   . GERD (gastroesophageal reflux disease)   . History of kidney stones   . Hyperlipidemia   . LBBB (left bundle branch block)   . Nephrolithiasis   . PAF (paroxysmal atrial fibrillation) (HCC)    a.  Diagnosed 10/2017; b.  On Eliquis and amiodarone; c. CHADS2VASc => 5 (CHF, HTN, age x 1, DM,  female)  . Pneumonia   . Sciatica   . Type II diabetes mellitus (HCC)     Past Surgical History:  Procedure Laterality Date  . ABDOMINAL HYSTERECTOMY    . CHOLECYSTECTOMY    . CYSTOSCOPY W/ RETROGRADES Bilateral 05/24/2017   Procedure: CYSTOSCOPY WITH RETROGRADE PYELOGRAM;  Surgeon: Vanna Scotland, MD;  Location: [facility] ORS;  Service: Urology;  Laterality: Bilateral;  . CYSTOSCOPY W/ URETERAL STENT PLACEMENT Right 11/02/2017   Procedure: CYSTOSCOPY WITH RETROGRADE PYELOGRAM/URETERAL STENT PLACEMENT;  Surgeon: Crista Elliot, MD;  Location: [facility] ORS;  Service: Urology;  Laterality: Right;  . CYSTOSCOPY/URETEROSCOPY/HOLMIUM LASER/STENT PLACEMENT Left 05/24/2017   Procedure: CYSTOSCOPY/URETEROSCOPY/HOLMIUM LASER/STENT PLACEMENT;  Surgeon: Vanna Scotland, MD;  Location: [facility] ORS;  Service: Urology;  Laterality: Left;    Current Meds  Medication Sig  . acetaminophen (TYLENOL) 325 MG tablet Take 2 tablets (650 mg total) by mouth every 6 (six) hours as needed for mild pain (or Fever >/= 101).  Marland Kitchen albuterol (PROVENTIL HFA;VENTOLIN HFA) 108 (90 Base) MCG/ACT inhaler Inhale 2 puffs into the lungs every 6 (six) hours as needed for wheezing or shortness of breath.  Marland Kitchen amiodarone (PACERONE) 200 MG tablet Take 1 tablet (200 mg total) by mouth daily.  Marland Kitchen apixaban (ELIQUIS) 5 MG TABS tablet Take 1 tablet (5 mg total) by mouth 2 (two) times daily.  . carvedilol (COREG) 3.125 MG tablet Take 1 tablet (3.125 mg total) by mouth 2 (two) times daily with a meal.  . feeding supplement, GLUCERNA SHAKE, (GLUCERNA SHAKE) LIQD Take 237 mLs by mouth 2 (two) times daily between meals.  . furosemide (LASIX) 20 MG tablet Take 20 mg by mouth daily.  Marland Kitchen HYDROcodone-acetaminophen (NORCO/VICODIN) 5-325 MG tablet Take 1-2 tablets by mouth every 6 (six) hours as needed for moderate pain.  . metFORMIN (GLUCOPHAGE-XR) 500 MG 24 hr tablet Take 500 mg by mouth 2 (two) times daily.  . Multiple Vitamin (MULTIVITAMIN WITH MINERALS)  TABS tablet Take 1 tablet by mouth daily.  Marland Kitchen PARoxetine (PAXIL) 20 MG tablet Take 20 mg by mouth at bedtime.  . rosuvastatin (CRESTOR) 40 MG tablet Take 1 tablet (40 mg total) by mouth daily.  . sitaGLIPtin (JANUVIA) 100 MG tablet Take 100 mg by mouth daily.  . traZODone (DESYREL) 50 MG tablet Take 1 tablet by mouth at bedtime.  . [DISCONTINUED] amiodarone (PACERONE) 200 MG tablet Take 1 tablet (200 mg total) by mouth 2 (two) times daily.    Allergies:   Codeine   Social History:  The patient  reports that she quit smoking about 20 years ago. Her smoking use included cigarettes. She has a 22.50 pack-year smoking history. She has never used smokeless tobacco. She reports previous alcohol use. She reports that she does not use drugs.   Family History:  The patient's family history includes Heart attack (age of onset: 47) in her father.  ROS:   Review of Systems  Constitutional: Positive for malaise/fatigue. Negative for chills, diaphoresis, fever and  weight loss.  HENT: Negative for congestion.   Eyes: Negative for discharge and redness.  Respiratory: Negative for cough, hemoptysis, sputum production, shortness of breath and wheezing.   Cardiovascular: Negative for chest pain, palpitations, orthopnea, claudication, leg swelling and PND.  Gastrointestinal: Negative for abdominal pain, blood in stool, heartburn, melena, nausea and vomiting.  Genitourinary: Negative for hematuria.  Musculoskeletal: Negative for falls and myalgias.  Skin: Negative for rash.  Neurological: Positive for weakness. Negative for dizziness, tingling, tremors, sensory change, speech change, focal weakness and loss of consciousness.  Endo/Heme/Allergies: Does not bruise/bleed easily.  Psychiatric/Behavioral: Negative for substance abuse. The patient is not nervous/anxious.   All other systems reviewed and are negative.    PHYSICAL EXAM:  VS:  BP 140/62 (BP Location: Left Arm, Patient Position: Sitting, Cuff Size:  Normal)   Pulse 63   Ht 5' 2.5" (1.588 m)   Wt 180 lb 8 oz (81.9 kg)   BMI 32.49 kg/m  BMI: Body mass index is 32.49 kg/m.  Physical Exam  Constitutional: She is oriented to person, place, and time. She appears well-developed and well-nourished.  HENT:  Head: Normocephalic and atraumatic.  Eyes: Right eye exhibits no discharge. Left eye exhibits no discharge.  Neck: Normal range of motion. No JVD present.  Cardiovascular: Normal rate, regular rhythm, S1 normal, S2 normal and normal heart sounds. Exam reveals no distant heart sounds, no friction rub, no midsystolic click and no opening snap.  No murmur heard. Pulses:      Posterior tibial pulses are 2+ on the right side and 2+ on the left side.  Pulmonary/Chest: Effort normal and breath sounds normal. No respiratory distress. She has no decreased breath sounds. She has no wheezes. She has no rales. She exhibits no tenderness.  Abdominal: Soft. She exhibits no distension. There is no abdominal tenderness.  Musculoskeletal:        General: No edema.  Neurological: She is alert and oriented to person, place, and time.  Skin: Skin is warm and dry. No cyanosis. Nails show no clubbing.  Psychiatric: She has a normal mood and affect. Her speech is normal and behavior is normal. Judgment and thought content normal.     EKG:  Was ordered and interpreted by me today. Shows NSR, 63 bpm, LBBB (known)  Recent Labs: 11/03/2017: B Natriuretic Peptide 862.0 12/19/2017: ALT 13 12/20/2017: Hemoglobin 8.7; Magnesium 2.2; Platelets 181 12/22/2017: BUN 15; Creatinine, Ser 1.02; Potassium 4.0; Sodium 139  No results found for requested labs within last 8760 hours.   Estimated Creatinine Clearance: 51.4 mL/min (A) (by C-G formula based on SCr of 1.02 mg/dL (H)).   Wt Readings from Last 3 Encounters:  01/09/18 180 lb 8 oz (81.9 kg)  01/01/18 180 lb 12.8 oz (82 kg)  12/18/17 187 lb 13.3 oz (85.2 kg)     Other studies reviewed: Additional  studies/records reviewed today include: summarized above  ASSESSMENT AND PLAN:  1. Pre-operative cardiac evaluation: Patient is scheduled for urological procedure on 01/22/2018 to include a cystoscopy and ureteroscopy with stent exchange.  Patient was previously advised to undergo cardiac catheterization in the setting of her new cardiomyopathy and elevated troponin in 10/2017.  However, the patient refused cardiac cath at that time.  In order to further risk stratify her we will need to, at a minimum, obtain repeat transthoracic echocardiogram to trend her EF now that she is not tachycardic.  If her EF remains low we will need to discuss this further with urology as she  would need further ischemic evaluation prior to risk stratification.  If her EF is now normalized, this would correspond with a tachycardia mediated cardiomyopathy and she could likely proceed with noncardiac surgery at a moderate risk for modified Lee criteria.  She will need to hold Eliquis 2 days prior to her upcoming procedure.  This was explained to the patient in detail.  She is aware of increased stroke risk while off anticoagulation.  Resumption of anticoagulation is deferred to the performing physician.  2. HFrEF: Her cardiomyopathy was felt to possibly be tachy-mediated during her admission in 10/2017.  She refused cardiac cath during her admission in 10/2017.  Her escalation of evidence-based heart failure therapy has been precluded by relative hypotension and AKI in the setting of sepsis/ATN from UTI and pneumonia.  She remains on Coreg 3.125 mg twice daily.  We will schedule her for a repeat echocardiogram this afternoon to reassess her EF now that she is no longer tachycardic.  Following this and her repeat labs this morning in the preoperative setting, we will likely initiate her on low-dose ACE inhibitor or ARB.  She will still require an ischemic evaluation as outlined below.  CHF education.  3. PAF/SVT: Maintaining sinus  rhythm with known left bundle branch block.  Decrease amiodarone to 200 mg once daily.  Continue Coreg 3.125 mg twice daily.  Given her CHADS2VASc of at least 5 (CHF, HTN, age x 1, DM, female) she will remain on Eliquis 5 mg twice daily.  She has been instructed to hold Eliquis 2 days prior to her upcoming urological procedure as outlined above.  Recent AST/ALT normal.  She declines labs to be drawn in our office today given she is going for preoperative labs later this morning.  We will need to check or add on a TSH given she is on amiodarone.  4. History of elevated troponin: Never with chest pain.  Declined cardiac cath as outlined above.  Repeat echocardiogram as outlined above.  If echo continues to demonstrate a cardiomyopathy she will need right and left cardiac cath.  If echo demonstrates improved EF would evaluate for high risk ischemia with a Lexiscan Myoview to further risk stratify.  She is on Eliquis in place of aspirin.  5. Anemia: Baseline HGB ~ 11 with most recent value being 8.7.  Possibly in the setting of her underlying urological issues.  She declines labs today given she is going for preoperative lab check later this morning.  I recommend CBC be checked given she is on Eliquis.  6. Hypertension: Blood pressure is suboptimally controlled today.  This is possibly in the setting of her flank pain secondary to nephrolithiasis.  Following her echocardiogram and repeat labs this morning in the preoperative setting, we will initiate the patient on low-dose ACE inhibitor or ARB.  7. Hyperlipidemia: Last lipid panel on file from 2015 with an LDL of 60 at that time. Currently on Crestor 40 mg daily.  She indicates Dr. Harl Bowie keeps a close eye on her lipids.  We do not have a recent lipid panel included in the notes sent over from his office.  I have advised her to follow-up with her PCP.  Disposition: F/u with Dr. Mariah Milling or an APP in 1 month.  Current medicines are reviewed at length with the  patient today.  The patient did not have any concerns regarding medicines.  Signed, Eula Listen, PA-C 01/09/2018 10:45 AM     [facility] [address]  [place], [place] [address] [phone]

## 2018-01-06 DIAGNOSIS — I4891 Unspecified atrial fibrillation: Secondary | ICD-10-CM | POA: Diagnosis not present

## 2018-01-06 DIAGNOSIS — F419 Anxiety disorder, unspecified: Secondary | ICD-10-CM | POA: Diagnosis not present

## 2018-01-06 DIAGNOSIS — I110 Hypertensive heart disease with heart failure: Secondary | ICD-10-CM | POA: Diagnosis not present

## 2018-01-06 DIAGNOSIS — N132 Hydronephrosis with renal and ureteral calculous obstruction: Secondary | ICD-10-CM | POA: Diagnosis not present

## 2018-01-06 DIAGNOSIS — Z48816 Encounter for surgical aftercare following surgery on the genitourinary system: Secondary | ICD-10-CM | POA: Diagnosis not present

## 2018-01-06 DIAGNOSIS — E119 Type 2 diabetes mellitus without complications: Secondary | ICD-10-CM | POA: Diagnosis not present

## 2018-01-06 DIAGNOSIS — I5020 Unspecified systolic (congestive) heart failure: Secondary | ICD-10-CM | POA: Diagnosis not present

## 2018-01-06 DIAGNOSIS — E785 Hyperlipidemia, unspecified: Secondary | ICD-10-CM | POA: Diagnosis not present

## 2018-01-06 DIAGNOSIS — J449 Chronic obstructive pulmonary disease, unspecified: Secondary | ICD-10-CM | POA: Diagnosis not present

## 2018-01-09 ENCOUNTER — Other Ambulatory Visit: Payer: Self-pay

## 2018-01-09 ENCOUNTER — Encounter
Admission: RE | Admit: 2018-01-09 | Discharge: 2018-01-09 | Disposition: A | Discharge status: Home / Self Care | Payer: Medicare HMO | Source: Ambulatory Visit | Attending: Urology | Admitting: Urology

## 2018-01-09 ENCOUNTER — Ambulatory Visit (INDEPENDENT_AMBULATORY_CARE_PROVIDER_SITE_OTHER): Payer: Medicare HMO | Admitting: Physician Assistant

## 2018-01-09 ENCOUNTER — Ambulatory Visit (INDEPENDENT_AMBULATORY_CARE_PROVIDER_SITE_OTHER): Payer: Medicare HMO

## 2018-01-09 ENCOUNTER — Encounter: Payer: Self-pay | Admitting: Physician Assistant

## 2018-01-09 VITALS — BP 140/62 | HR 63 | Ht 62.5 in | Wt 180.5 lb

## 2018-01-09 DIAGNOSIS — Z01818 Encounter for other preprocedural examination: Secondary | ICD-10-CM | POA: Insufficient documentation

## 2018-01-09 DIAGNOSIS — I5022 Chronic systolic (congestive) heart failure: Secondary | ICD-10-CM | POA: Diagnosis not present

## 2018-01-09 DIAGNOSIS — R778 Other specified abnormalities of plasma proteins: Secondary | ICD-10-CM

## 2018-01-09 DIAGNOSIS — I10 Essential (primary) hypertension: Secondary | ICD-10-CM

## 2018-01-09 DIAGNOSIS — N201 Calculus of ureter: Secondary | ICD-10-CM | POA: Diagnosis not present

## 2018-01-09 DIAGNOSIS — D649 Anemia, unspecified: Secondary | ICD-10-CM

## 2018-01-09 DIAGNOSIS — Z01810 Encounter for preprocedural cardiovascular examination: Secondary | ICD-10-CM

## 2018-01-09 DIAGNOSIS — E782 Mixed hyperlipidemia: Secondary | ICD-10-CM | POA: Diagnosis not present

## 2018-01-09 DIAGNOSIS — I480 Paroxysmal atrial fibrillation: Secondary | ICD-10-CM

## 2018-01-09 DIAGNOSIS — R7989 Other specified abnormal findings of blood chemistry: Secondary | ICD-10-CM | POA: Diagnosis not present

## 2018-01-09 DIAGNOSIS — I471 Supraventricular tachycardia: Secondary | ICD-10-CM

## 2018-01-09 LAB — DIFFERENTIAL
Basophils Absolute: 0.1 10*3/uL (ref 0.0–0.1)
Basophils Relative: 1 %
Eosinophils Absolute: 0.1 10*3/uL (ref 0.0–0.5)
Eosinophils Relative: 2 %
Lymphocytes Relative: 37 %
Lymphs Abs: 2.2 10*3/uL (ref 0.7–4.0)
Monocytes Absolute: 0.5 10*3/uL (ref 0.1–1.0)
Monocytes Relative: 7 %
NEUTROS ABS: 3.2 10*3/uL (ref 1.7–7.7)
Neutrophils Relative %: 53 %

## 2018-01-09 LAB — CBC
HCT: 34.8 % — ABNORMAL LOW (ref 36.0–46.0)
Hemoglobin: 11 g/dL — ABNORMAL LOW (ref 12.0–15.0)
MCH: 27 pg (ref 26.0–34.0)
MCHC: 31.6 g/dL (ref 30.0–36.0)
MCV: 85.5 fL (ref 80.0–100.0)
Platelets: 172 10*3/uL (ref 150–400)
RBC: 4.07 MIL/uL (ref 3.87–5.11)
RDW: 15.7 % — ABNORMAL HIGH (ref 11.5–15.5)
WBC: 6.1 10*3/uL (ref 4.0–10.5)
nRBC: 0 % (ref 0.0–0.2)

## 2018-01-09 LAB — ECHOCARDIOGRAM LIMITED
Height: 62.5 in
Weight: 2882 oz

## 2018-01-09 MED ORDER — AMIODARONE HCL 200 MG PO TABS: 200.0000 mg | ORAL_TABLET | Freq: Every day | ORAL | 0 refills | Status: AC

## 2018-01-09 NOTE — Patient Instructions (Signed)
Medication Instructions:  DECREASE the Amiodarone to 200 mg daily  If you need a refill on your cardiac medications before your next appointment, please call your pharmacy.   Lab work: None ordered If you have labs (blood work) drawn today and your tests are completely normal, you will receive your results only by: Marland Kitchen. MyChart Message (if you have MyChart) OR . A paper copy in the mail If you have any lab test that is abnormal or we need to change your treatment, we will call you to review the results.  Testing/Procedures: Your physician has requested that you have an echocardiogram today. Echocardiography is a painless test that uses sound waves to create images of your heart. It provides your doctor with information about the size and shape of your heart and how well your heart's chambers and valves are working. You may receive an ultrasound enhancing agent through an IV if needed to better visualize your heart during the echo.This procedure takes approximately one hour. There are no restrictions for this procedure. This will take place at the [facility].    Follow-Up: At [facility], you and your health needs are our priority.  As part of our continuing mission to provide you with exceptional heart care, we have created designated Provider Care Teams.  These Care Teams include your primary Cardiologist (physician) and Advanced Practice Providers (APPs -  Physician Assistants and Nurse Practitioners) who all work together to provide you with the care you need, when you need it. You will need a follow up appointment in 1 months.You may see Julien Nordmannimothy Gollan, MD or one of the following Advanced Practice Providers on your designated Care Team:   Nicolasa Duckinghristopher Berge, NP Eula Listenyan Dunn, PA-C . Marisue IvanJacquelyn Visser, PA-C  Any Other Special Instructions Will Be Listed Below (If Applicable). If you have been cleared for the procedure please hold the Eliquis 2 days prior to the procedure (1/11  and 1/12). Please resume according to the surgeon's discretion.

## 2018-01-09 NOTE — Patient Instructions (Signed)
Your procedure is scheduled on: Mon 01/22/18 Report to Day Surgery. To find out your arrival time please call [phone] between 1PM - 3PM on .Friday 01/19/18  Remember: Instructions that are not followed completely may result in serious medical risk,  up to and including death, or upon the discretion of your surgeon and anesthesiologist your  surgery may need to be rescheduled.     _X__ 1. Do not eat food after midnight the night before your procedure.                 No gum chewing or hard candies. You may drink clear liquids up to 2 hours                 before you are scheduled to arrive for your surgery- DO not drink clear                 liquids within 2 hours of the start of your surgery.                 Clear Liquids include:  water, Black Coffee  __X__2.  On the morning of surgery brush your teeth with toothpaste and water, you                may rinse your mouth with mouthwash if you wish.  Do not swallow any toothpaste of mouthwash.     _X__ 3.  No Alcohol for 24 hours before or after surgery.   _X__ 4.  Do Not Smoke or use e-cigarettes For 24 Hours Prior to Your Surgery.                 Do not use any chewable tobacco products for at least 6 hours prior to                 surgery.  ____  5.  Bring all medications with you on the day of surgery if instructed.   __x__  6.  Notify your doctor if there is any change in your medical condition      (cold, fever, infections).     Do not wear jewelry, make-up, hairpins, clips or nail polish. Do not wear lotions, powders, or perfumes. You may wear deodorant. Do not shave 48 hours prior to surgery. Men may shave face and neck. Do not bring valuables to the hospital.    [facility] is not responsible for any belongings or valuables.  Contacts, dentures or bridgework may not be worn into surgery. Leave your suitcase in the car. After surgery it may be brought to your room. For patients admitted to the  hospital, discharge time is determined by your treatment team.   Patients discharged the day of surgery will not be allowed to drive home.   Please read over the following fact sheets that you were given:     _x___ Take these medicines the morning of surgery with A SIP OF WATER:    1. amiodarone (PACERONE) 200 MG tablet  2. carvedilol (COREG) 3.125 MG tablet  3. HYDROcodone-acetaminophen (NORCO/VICODIN) 5-325 MG tablet if needed  4.rosuvastatin (CRESTOR) 40 MG tablet  5.  6.  ____ Fleet Enema (as directed)   ____ Use CHG Soap as directed  __x__ Use inhalers on the day of surgeryalbuterol (PROVENTIL HFA;VENTOLIN HFA) 108 (90 Base) MCG/ACT inhaler  __x__ Stop metformin 2 days prior to surgery last dose on Friday    ____ Take 1/2 of usual insulin dose the night before  surgery. No insulin the morning          of surgery.   __x__ Stop  on apixaban (ELIQUIS) 5 MG TABS tablet last dose on the 01/19/08  ____ Stop Anti-inflammatories on    ____ Stop supplements until after surgery.    ____ Bring C-Pap to the hospital.

## 2018-01-11 ENCOUNTER — Ambulatory Visit: Payer: Medicare HMO | Admitting: Physician Assistant

## 2018-01-11 ENCOUNTER — Telehealth: Payer: Self-pay

## 2018-01-11 DIAGNOSIS — I5022 Chronic systolic (congestive) heart failure: Secondary | ICD-10-CM

## 2018-01-11 LAB — URINE CULTURE: Culture: >=100000 — AB

## 2018-01-11 MED ORDER — LISINOPRIL 5 MG PO TABS: 5.0000 mg | ORAL_TABLET | Freq: Every day | ORAL | 2 refills | Status: DC

## 2018-01-11 NOTE — Telephone Encounter (Signed)
Per Dr. Lonna Cobb patient's urine culture came back positive for infection and it is resistant to oral antibiotics except for Nitrofurantion. Patient was contacted and states that she is not having any urinary symptoms, fever, chills, nausea and vomiting. Per Dr. Lonna Cobb will wait to treat based off Dr. Delana Meyer decision prior to surgery. Please advise thanks

## 2018-01-11 NOTE — Telephone Encounter (Signed)
-----   Message from Sondra Barges, PA-C sent at 01/10/2018 11:35 AM EST ----- Echo showed continued cardiomyopathy with an EF of 20-25%, unable to exclude wall motion abnormalities, slightly stiffened heart, mild to moderate leaky mitral valve and a trivial pericardial effusion.  -Her echo and clinical case has been discussed with Dr. Mariah Milling.  -She is scheduled for urological procedure on 01/22/2018, which is needed.  -She is completely asymptomatic from a cardiac perspective without any signs or symptoms of decompensated heart failure or unstable angina.  -She will require an ischemic evaluation, though this will be deferred until after her urology procedure given the need.  -Peri-operative fluids will need to be minimized given her cardiomyopathy.  -Please start lisinopril 5 mg daily. -Recheck BMP in 1 week following initiation of ACEi.  -Needs follow up with cardiology in 1 month to discuss ischemic evaluation and to further optimize her heart failure regimen.

## 2018-01-11 NOTE — Telephone Encounter (Signed)
Call to patient to discuss result of Echo. Pt agreeable to POC.  Medication e script sent to preferred pharmacy.  Labwork order placed.   Pt has f/u scheduled previously, pt made aware of appt date/time.  Advised pt to call for any further questions or concerns

## 2018-01-12 ENCOUNTER — Telehealth: Payer: Self-pay | Admitting: *Deleted

## 2018-01-12 NOTE — Telephone Encounter (Signed)
Left message for the patient to call back concerning the surgical clearance. Per Eula Listen, PA the patient should hold the Eliquis 2 days prior to surgery.  Clearance has been faxed to successfully [phone]

## 2018-01-14 NOTE — Telephone Encounter (Signed)
She is a relatively high risk patient given her history of sepsis from urinary source, recurrent stones, and her cardiac disease.  Please go ahead and start her on Macrobid 100 mg twice daily for a total of 10 days.  I would like her to come in on Thursday after having been on antibiotics for a few days and repeat the culture.  Her surgery scheduled for the 13th.  If her repeat culture continues to grow E. coli, she will need to be referred to infectious disease for treatment to ureteroscopy and be treated at some point during her IV antibiotic course.  Vanna Scotland, MD

## 2018-01-15 ENCOUNTER — Other Ambulatory Visit: Payer: Self-pay | Admitting: Radiology

## 2018-01-15 DIAGNOSIS — R319 Hematuria, unspecified: Principal | ICD-10-CM

## 2018-01-15 DIAGNOSIS — N390 Urinary tract infection, site not specified: Secondary | ICD-10-CM

## 2018-01-15 DIAGNOSIS — N200 Calculus of kidney: Secondary | ICD-10-CM

## 2018-01-15 DIAGNOSIS — N201 Calculus of ureter: Secondary | ICD-10-CM

## 2018-01-15 MED ORDER — NITROFURANTOIN MONOHYD MACRO 100 MG PO CAPS: 100.0000 mg | ORAL_CAPSULE | Freq: Two times a day (BID) | ORAL | 0 refills | Status: DC

## 2018-01-15 NOTE — Telephone Encounter (Signed)
Patient notified of positive urine culture & script sent to pharmacy. Appointment for repeat urine culture made. Patient aware. Patient expresses understanding of conversation.

## 2018-01-17 NOTE — Telephone Encounter (Signed)
Patient has been made aware and verbalized her understanding to hold the Eliquis 2 days prior.

## 2018-01-18 ENCOUNTER — Other Ambulatory Visit: Payer: Medicare Other

## 2018-01-18 DIAGNOSIS — N201 Calculus of ureter: Secondary | ICD-10-CM

## 2018-01-18 DIAGNOSIS — N200 Calculus of kidney: Secondary | ICD-10-CM

## 2018-01-18 DIAGNOSIS — R319 Hematuria, unspecified: Principal | ICD-10-CM

## 2018-01-18 DIAGNOSIS — N390 Urinary tract infection, site not specified: Secondary | ICD-10-CM

## 2018-01-21 MED ORDER — CEFAZOLIN SODIUM-DEXTROSE 2-4 GM/100ML-% IV SOLN: 2.0000 g | INTRAVENOUS | Status: DC

## 2018-01-22 ENCOUNTER — Encounter
Admission: RE | Disposition: A | Discharge status: Home / Self Care | Payer: Self-pay | Source: Ambulatory Visit | Attending: Urology

## 2018-01-22 ENCOUNTER — Ambulatory Visit
Admission: RE | Admit: 2018-01-22 | Discharge: 2018-01-22 | Disposition: A | Discharge status: Home / Self Care | Payer: Medicare Other | Source: Ambulatory Visit | Attending: Urology | Admitting: Urology

## 2018-01-22 ENCOUNTER — Telehealth: Payer: Self-pay | Admitting: Radiology

## 2018-01-22 ENCOUNTER — Other Ambulatory Visit: Payer: Self-pay | Admitting: Radiology

## 2018-01-22 DIAGNOSIS — N200 Calculus of kidney: Secondary | ICD-10-CM

## 2018-01-22 DIAGNOSIS — Z539 Procedure and treatment not carried out, unspecified reason: Secondary | ICD-10-CM | POA: Diagnosis present

## 2018-01-22 DIAGNOSIS — Z5309 Procedure and treatment not carried out because of other contraindication: Secondary | ICD-10-CM | POA: Diagnosis not present

## 2018-01-22 DIAGNOSIS — N201 Calculus of ureter: Secondary | ICD-10-CM

## 2018-01-22 LAB — CULTURE, URINE COMPREHENSIVE

## 2018-01-22 SURGERY — CYSTOSCOPY/URETEROSCOPY/HOLMIUM LASER/STENT PLACEMENT
Anesthesia: Choice | Laterality: Right

## 2018-01-22 MED ORDER — FAMOTIDINE 20 MG PO TABS: 20.0000 mg | ORAL_TABLET | Freq: Once | ORAL | Status: DC

## 2018-01-22 MED ORDER — MIDAZOLAM HCL 2 MG/2ML IJ SOLN
INTRAMUSCULAR | Status: AC
  Filled 2018-01-22: qty 2

## 2018-01-22 MED ORDER — SODIUM CHLORIDE 0.9 % IV SOLN: INTRAVENOUS | Status: DC

## 2018-01-22 MED ORDER — FAMOTIDINE 20 MG PO TABS
ORAL_TABLET | ORAL | Status: AC
  Filled 2018-01-22: qty 1

## 2018-01-22 MED ORDER — CEFAZOLIN SODIUM-DEXTROSE 2-4 GM/100ML-% IV SOLN
INTRAVENOUS | Status: AC
  Filled 2018-01-22: qty 100

## 2018-01-22 SURGICAL SUPPLY — 28 items
BAG DRAIN CYSTO-URO LG1000N (MISCELLANEOUS) ×3 IMPLANT
BASKET ZERO TIP 1.9FR (BASKET) IMPLANT
BRUSH SCRUB EZ 1% IODOPHOR (MISCELLANEOUS) ×3 IMPLANT
CATH URETL 5X70 OPEN END (CATHETERS) ×3 IMPLANT
CNTNR SPEC 2.5X3XGRAD LEK (MISCELLANEOUS)
CONT SPEC 4OZ STER OR WHT (MISCELLANEOUS)
CONTAINER SPEC 2.5X3XGRAD LEK (MISCELLANEOUS) IMPLANT
DRAPE UTILITY 15X26 TOWEL STRL (DRAPES) ×3 IMPLANT
FIBER LASER LITHO 273 (Laser) IMPLANT
GLOVE BIO SURGEON STRL SZ 6.5 (GLOVE) ×2 IMPLANT
GLOVE BIO SURGEONS STRL SZ 6.5 (GLOVE) ×1
GOWN STRL REUS W/ TWL LRG LVL3 (GOWN DISPOSABLE) ×2 IMPLANT
GOWN STRL REUS W/TWL LRG LVL3 (GOWN DISPOSABLE) ×4
GUIDEWIRE GREEN .038 145CM (MISCELLANEOUS) IMPLANT
INFUSOR MANOMETER BAG 3000ML (MISCELLANEOUS) ×3 IMPLANT
INTRODUCER DILATOR DOUBLE (INTRODUCER) IMPLANT
KIT TURNOVER CYSTO (KITS) ×3 IMPLANT
PACK CYSTO AR (MISCELLANEOUS) ×3 IMPLANT
SENSORWIRE 0.038 NOT ANGLED (WIRE) ×3
SET CYSTO W/LG BORE CLAMP LF (SET/KITS/TRAYS/PACK) ×3 IMPLANT
SHEATH URETERAL 12FRX35CM (MISCELLANEOUS) IMPLANT
SOL .9 NS 3000ML IRR  AL (IV SOLUTION) ×2
SOL .9 NS 3000ML IRR UROMATIC (IV SOLUTION) ×1 IMPLANT
STENT URET 6FRX24 CONTOUR (STENTS) IMPLANT
STENT URET 6FRX26 CONTOUR (STENTS) IMPLANT
SURGILUBE 2OZ TUBE FLIPTOP (MISCELLANEOUS) ×3 IMPLANT
WATER STERILE IRR 1000ML POUR (IV SOLUTION) ×3 IMPLANT
WIRE SENSOR 0.038 NOT ANGLED (WIRE) ×1 IMPLANT

## 2018-01-22 NOTE — OR Nursing (Signed)
Patient arrived and reported she had a problem. She reports she has her pill pack filled by the pharmacy and she took all of her pills yesterday which would have her eliquis in it. She called the pharmacist for help to remove eliquis from today's pill pack but she is not sure if she removed the correct one or not. Dr. [name] notified and she reports surgery needs to be postponed. I had conversation with patient's son and gave written instructions on how to stop eliquis for the two days prior to her next surgery reschedule. He will help patient to call the office to get rescheduled.

## 2018-01-22 NOTE — Telephone Encounter (Signed)
LMOM to notify patient of surgery with Dr Apolinar Junes rescheduled to 01/29/2018. Advised patient to call 01/26/2018 for arrival time to Same Day Surgery & to hold Eliquis x 2 days prior to surgery. Requested call back to confirm understanding.

## 2018-01-22 NOTE — Telephone Encounter (Signed)
Patient confirmed instructions for ureteroscopy, laser lithotripsy & ureteral stent exchange with Dr Apolinar Junes scheduled 01/29/2018. Questions answered. Patient expresses understanding of instructions.

## 2018-01-28 MED ORDER — CEFAZOLIN SODIUM-DEXTROSE 2-4 GM/100ML-% IV SOLN
2.0000 g | INTRAVENOUS | Status: AC
  Administered 2018-01-29: 2 g via INTRAVENOUS

## 2018-01-29 ENCOUNTER — Encounter: Payer: Self-pay | Admitting: Certified Registered"

## 2018-01-29 ENCOUNTER — Ambulatory Visit: Payer: Medicare Other | Admitting: Certified Registered"

## 2018-01-29 ENCOUNTER — Encounter
Admission: RE | Disposition: A | Discharge status: Home / Self Care | Payer: Self-pay | Source: Home / Self Care | Attending: Urology

## 2018-01-29 ENCOUNTER — Ambulatory Visit
Admission: RE | Admit: 2018-01-29 | Discharge: 2018-01-29 | Disposition: A | Discharge status: Home / Self Care | Payer: Medicare Other | Source: Home / Self Care | Attending: Urology | Admitting: Urology

## 2018-01-29 ENCOUNTER — Telehealth: Payer: Self-pay | Admitting: Urology

## 2018-01-29 DIAGNOSIS — I2510 Atherosclerotic heart disease of native coronary artery without angina pectoris: Secondary | ICD-10-CM

## 2018-01-29 DIAGNOSIS — M5136 Other intervertebral disc degeneration, lumbar region: Secondary | ICD-10-CM

## 2018-01-29 DIAGNOSIS — F419 Anxiety disorder, unspecified: Secondary | ICD-10-CM | POA: Insufficient documentation

## 2018-01-29 DIAGNOSIS — Z7984 Long term (current) use of oral hypoglycemic drugs: Secondary | ICD-10-CM

## 2018-01-29 DIAGNOSIS — Z87891 Personal history of nicotine dependence: Secondary | ICD-10-CM

## 2018-01-29 DIAGNOSIS — I447 Left bundle-branch block, unspecified: Secondary | ICD-10-CM | POA: Insufficient documentation

## 2018-01-29 DIAGNOSIS — I252 Old myocardial infarction: Secondary | ICD-10-CM | POA: Insufficient documentation

## 2018-01-29 DIAGNOSIS — Z87442 Personal history of urinary calculi: Secondary | ICD-10-CM

## 2018-01-29 DIAGNOSIS — I429 Cardiomyopathy, unspecified: Secondary | ICD-10-CM | POA: Insufficient documentation

## 2018-01-29 DIAGNOSIS — Z885 Allergy status to narcotic agent status: Secondary | ICD-10-CM | POA: Insufficient documentation

## 2018-01-29 DIAGNOSIS — Z79891 Long term (current) use of opiate analgesic: Secondary | ICD-10-CM | POA: Insufficient documentation

## 2018-01-29 DIAGNOSIS — R509 Fever, unspecified: Secondary | ICD-10-CM | POA: Diagnosis not present

## 2018-01-29 DIAGNOSIS — I110 Hypertensive heart disease with heart failure: Secondary | ICD-10-CM

## 2018-01-29 DIAGNOSIS — E119 Type 2 diabetes mellitus without complications: Secondary | ICD-10-CM | POA: Insufficient documentation

## 2018-01-29 DIAGNOSIS — E7800 Pure hypercholesterolemia, unspecified: Secondary | ICD-10-CM

## 2018-01-29 DIAGNOSIS — I509 Heart failure, unspecified: Secondary | ICD-10-CM

## 2018-01-29 DIAGNOSIS — N200 Calculus of kidney: Secondary | ICD-10-CM

## 2018-01-29 DIAGNOSIS — A419 Sepsis, unspecified organism: Secondary | ICD-10-CM | POA: Diagnosis not present

## 2018-01-29 DIAGNOSIS — Z79899 Other long term (current) drug therapy: Secondary | ICD-10-CM

## 2018-01-29 DIAGNOSIS — K219 Gastro-esophageal reflux disease without esophagitis: Secondary | ICD-10-CM

## 2018-01-29 DIAGNOSIS — N201 Calculus of ureter: Secondary | ICD-10-CM

## 2018-01-29 HISTORY — PX: CYSTOSCOPY/URETEROSCOPY/HOLMIUM LASER/STENT PLACEMENT: SHX6546

## 2018-01-29 HISTORY — PX: CYSTOSCOPY W/ RETROGRADES: SHX1426

## 2018-01-29 LAB — GLUCOSE, CAPILLARY
Glucose-Capillary: 162 mg/dL — ABNORMAL HIGH (ref 70–99)
Glucose-Capillary: 177 mg/dL — ABNORMAL HIGH (ref 70–99)

## 2018-01-29 SURGERY — CYSTOSCOPY/URETEROSCOPY/HOLMIUM LASER/STENT PLACEMENT
Anesthesia: General | Site: Ureter | Laterality: Right

## 2018-01-29 MED ORDER — SODIUM CHLORIDE 0.9 % IV SOLN
INTRAVENOUS | Status: DC
  Administered 2018-01-29: 13:00:00 via INTRAVENOUS

## 2018-01-29 MED ORDER — OXYBUTYNIN CHLORIDE 5 MG PO TABS: 5.0000 mg | ORAL_TABLET | Freq: Three times a day (TID) | ORAL | 0 refills | Status: DC | PRN

## 2018-01-29 MED ORDER — IPRATROPIUM-ALBUTEROL 0.5-2.5 (3) MG/3ML IN SOLN
3.0000 mL | Freq: Once | RESPIRATORY_TRACT | Status: AC
  Administered 2018-01-29: 3 mL via RESPIRATORY_TRACT

## 2018-01-29 MED ORDER — DOCUSATE SODIUM 100 MG PO CAPS: 100.0000 mg | ORAL_CAPSULE | Freq: Two times a day (BID) | ORAL | 0 refills | Status: DC

## 2018-01-29 MED ORDER — DEXAMETHASONE SODIUM PHOSPHATE 10 MG/ML IJ SOLN
INTRAMUSCULAR | Status: AC
  Filled 2018-01-29: qty 1

## 2018-01-29 MED ORDER — ONDANSETRON HCL 4 MG/2ML IJ SOLN: 4.0000 mg | Freq: Once | INTRAMUSCULAR | Status: DC | PRN

## 2018-01-29 MED ORDER — FENTANYL CITRATE (PF) 100 MCG/2ML IJ SOLN
INTRAMUSCULAR | Status: DC | PRN
  Administered 2018-01-29: 50 ug via INTRAVENOUS

## 2018-01-29 MED ORDER — LIDOCAINE HCL (CARDIAC) PF 100 MG/5ML IV SOSY
PREFILLED_SYRINGE | INTRAVENOUS | Status: DC | PRN
  Administered 2018-01-29: 100 mg via INTRAVENOUS

## 2018-01-29 MED ORDER — CEFAZOLIN SODIUM-DEXTROSE 2-4 GM/100ML-% IV SOLN
INTRAVENOUS | Status: AC
  Filled 2018-01-29: qty 100

## 2018-01-29 MED ORDER — IOPAMIDOL (ISOVUE-200) INJECTION 41%
INTRAVENOUS | Status: DC | PRN
  Administered 2018-01-29: 20 mL

## 2018-01-29 MED ORDER — FENTANYL CITRATE (PF) 100 MCG/2ML IJ SOLN
INTRAMUSCULAR | Status: AC
  Filled 2018-01-29: qty 2

## 2018-01-29 MED ORDER — LIDOCAINE HCL (PF) 2 % IJ SOLN
INTRAMUSCULAR | Status: AC
  Filled 2018-01-29: qty 10

## 2018-01-29 MED ORDER — EPHEDRINE SULFATE 50 MG/ML IJ SOLN
INTRAMUSCULAR | Status: DC | PRN
  Administered 2018-01-29: 10 mg via INTRAVENOUS

## 2018-01-29 MED ORDER — PHENYLEPHRINE HCL 10 MG/ML IJ SOLN
INTRAMUSCULAR | Status: DC | PRN
  Administered 2018-01-29: 100 ug via INTRAVENOUS

## 2018-01-29 MED ORDER — LACTATED RINGERS IV SOLN
INTRAVENOUS | Status: DC | PRN
  Administered 2018-01-29: 13:00:00 via INTRAVENOUS

## 2018-01-29 MED ORDER — FAMOTIDINE 20 MG PO TABS
20.0000 mg | ORAL_TABLET | Freq: Once | ORAL | Status: AC
  Administered 2018-01-29: 20 mg via ORAL

## 2018-01-29 MED ORDER — PROPOFOL 10 MG/ML IV BOLUS
INTRAVENOUS | Status: DC | PRN
  Administered 2018-01-29: 80 mg via INTRAVENOUS

## 2018-01-29 MED ORDER — ONDANSETRON HCL 4 MG/2ML IJ SOLN
INTRAMUSCULAR | Status: AC
  Filled 2018-01-29: qty 2

## 2018-01-29 MED ORDER — ONDANSETRON HCL 4 MG/2ML IJ SOLN
INTRAMUSCULAR | Status: DC | PRN
  Administered 2018-01-29: 4 mg via INTRAVENOUS

## 2018-01-29 MED ORDER — FAMOTIDINE 20 MG PO TABS
ORAL_TABLET | ORAL | Status: AC
  Administered 2018-01-29: 20 mg via ORAL
  Filled 2018-01-29: qty 1

## 2018-01-29 MED ORDER — PROPOFOL 10 MG/ML IV BOLUS
INTRAVENOUS | Status: AC
  Filled 2018-01-29: qty 20

## 2018-01-29 MED ORDER — MIDAZOLAM HCL 2 MG/2ML IJ SOLN
INTRAMUSCULAR | Status: AC
  Filled 2018-01-29: qty 2

## 2018-01-29 MED ORDER — FENTANYL CITRATE (PF) 100 MCG/2ML IJ SOLN: 25.0000 ug | INTRAMUSCULAR | Status: DC | PRN

## 2018-01-29 MED ORDER — SUCCINYLCHOLINE CHLORIDE 20 MG/ML IJ SOLN
INTRAMUSCULAR | Status: AC
  Filled 2018-01-29: qty 1

## 2018-01-29 MED ORDER — IPRATROPIUM-ALBUTEROL 0.5-2.5 (3) MG/3ML IN SOLN
RESPIRATORY_TRACT | Status: AC
  Administered 2018-01-29: 3 mL via RESPIRATORY_TRACT
  Filled 2018-01-29: qty 3

## 2018-01-29 MED ORDER — DEXAMETHASONE SODIUM PHOSPHATE 10 MG/ML IJ SOLN
INTRAMUSCULAR | Status: DC | PRN
  Administered 2018-01-29: 6 mg via INTRAVENOUS

## 2018-01-29 MED ORDER — HYDROCODONE-ACETAMINOPHEN 5-325 MG PO TABS: 1.0000 | ORAL_TABLET | Freq: Four times a day (QID) | ORAL | 0 refills | Status: DC | PRN

## 2018-01-29 MED ORDER — SUCCINYLCHOLINE CHLORIDE 20 MG/ML IJ SOLN
INTRAMUSCULAR | Status: DC | PRN
  Administered 2018-01-29: 100 mg via INTRAVENOUS

## 2018-01-29 MED ORDER — ROCURONIUM BROMIDE 50 MG/5ML IV SOLN
INTRAVENOUS | Status: AC
  Filled 2018-01-29: qty 1

## 2018-01-29 MED ORDER — TAMSULOSIN HCL 0.4 MG PO CAPS: 0.4000 mg | ORAL_CAPSULE | Freq: Every day | ORAL | 0 refills | Status: DC

## 2018-01-29 MED ORDER — ROCURONIUM BROMIDE 100 MG/10ML IV SOLN
INTRAVENOUS | Status: DC | PRN
  Administered 2018-01-29: 10 mg via INTRAVENOUS

## 2018-01-29 SURGICAL SUPPLY — 29 items
BAG DRAIN CYSTO-URO LG1000N (MISCELLANEOUS) ×3 IMPLANT
BASKET ZERO TIP 1.9FR (BASKET) ×2 IMPLANT
BRUSH SCRUB EZ 1% IODOPHOR (MISCELLANEOUS) ×3 IMPLANT
CATH URETL 5X70 OPEN END (CATHETERS) ×3 IMPLANT
CNTNR SPEC 2.5X3XGRAD LEK (MISCELLANEOUS)
CONT SPEC 4OZ STER OR WHT (MISCELLANEOUS)
CONTAINER SPEC 2.5X3XGRAD LEK (MISCELLANEOUS) IMPLANT
DRAPE UTILITY 15X26 TOWEL STRL (DRAPES) ×3 IMPLANT
FIBER LASER LITHO 273 (Laser) ×2 IMPLANT
GLOVE BIO SURGEON STRL SZ 6.5 (GLOVE) ×2 IMPLANT
GLOVE BIO SURGEONS STRL SZ 6.5 (GLOVE) ×1
GOWN STRL REUS W/ TWL LRG LVL3 (GOWN DISPOSABLE) ×2 IMPLANT
GOWN STRL REUS W/TWL LRG LVL3 (GOWN DISPOSABLE) ×4
GUIDEWIRE GREEN .038 145CM (MISCELLANEOUS) IMPLANT
GUIDEWIRE SUPER STIFF (WIRE) ×2 IMPLANT
INFUSOR MANOMETER BAG 3000ML (MISCELLANEOUS) ×3 IMPLANT
INTRODUCER DILATOR DOUBLE (INTRODUCER) ×2 IMPLANT
KIT TURNOVER CYSTO (KITS) ×3 IMPLANT
PACK CYSTO AR (MISCELLANEOUS) ×3 IMPLANT
SENSORWIRE 0.038 NOT ANGLED (WIRE) ×3
SET CYSTO W/LG BORE CLAMP LF (SET/KITS/TRAYS/PACK) ×3 IMPLANT
SHEATH URETERAL 12FRX35CM (MISCELLANEOUS) ×2 IMPLANT
SOL .9 NS 3000ML IRR  AL (IV SOLUTION) ×2
SOL .9 NS 3000ML IRR UROMATIC (IV SOLUTION) ×1 IMPLANT
STENT URET 6FRX24 CONTOUR (STENTS) ×2 IMPLANT
STENT URET 6FRX26 CONTOUR (STENTS) IMPLANT
SURGILUBE 2OZ TUBE FLIPTOP (MISCELLANEOUS) ×3 IMPLANT
WATER STERILE IRR 1000ML POUR (IV SOLUTION) ×3 IMPLANT
WIRE SENSOR 0.038 NOT ANGLED (WIRE) ×1 IMPLANT

## 2018-01-29 NOTE — Transfer of Care (Signed)
Immediate Anesthesia Transfer of Care Note  Patient: Kristina Archer  Procedure(s) Performed: CYSTOSCOPY/URETEROSCOPY/HOLMIUM LASER/STENT Exchange (Right Ureter) CYSTOSCOPY WITH RETROGRADE PYELOGRAM (Right Ureter)  Patient Location: PACU  Anesthesia Type:General  Level of Consciousness: awake, alert  and oriented  Airway & Oxygen Therapy: Patient Spontanous Breathing and Patient connected to face mask oxygen  Post-op Assessment: Report given to RN and Post -op Vital signs reviewed and stable  Post vital signs: Reviewed and stable  Last Vitals:  Vitals Value Taken Time  BP    Temp    Pulse    Resp    SpO2      Last Pain:  Vitals:   01/29/18 1242  TempSrc: Tympanic         Complications: No apparent anesthesia complications

## 2018-01-29 NOTE — Progress Notes (Signed)
Duo neb given for low sats

## 2018-01-29 NOTE — Anesthesia Procedure Notes (Signed)
Procedure Name: Intubation Date/Time: 01/29/2018 1:19 PM Performed by: Philbert Riser, CRNA Pre-anesthesia Checklist: Patient identified, Emergency Drugs available, Suction available, Patient being monitored and Timeout performed Patient Re-evaluated:Patient Re-evaluated prior to induction Oxygen Delivery Method: Circle system utilized and Simple face mask Preoxygenation: Pre-oxygenation with 100% oxygen Induction Type: IV induction Ventilation: Mask ventilation without difficulty Laryngoscope Size: Mac and 3 Grade View: Grade I Tube type: Oral Tube size: 6.5 mm Number of attempts: 1 Airway Equipment and Method: Stylet Placement Confirmation: ETT inserted through vocal cords under direct vision,  positive ETCO2 and breath sounds checked- equal and bilateral Secured at: 19 cm Tube secured with: Tape Dental Injury: Teeth and Oropharynx as per pre-operative assessment

## 2018-01-29 NOTE — Telephone Encounter (Signed)
App has been made ° ° °Kristina Archer °

## 2018-01-29 NOTE — Discharge Instructions (Signed)
You have a ureteral stent in place.  This is a tube that extends from your kidney to your bladder.  This may cause urinary bleeding, burning with urination, and urinary frequency.  Please call our office or present to the ED if you develop fevers >101 or pain which is not able to be controlled with oral pain medications.  You may be given either Flomax and/ or ditropan to help with bladder spasms and stent pain in addition to pain medications.    Your stent is on a string.  On Friday, you may untaped the string and pull gently until the entire stent is removed.  If you have any issues, please call our office.  You will be seen in 1 month with a renal ultrasound just prior to your appointment.   [facility] [address], [address] [place], [place] [address] [phone]  AMBULATORY SURGERY  DISCHARGE INSTRUCTIONS   1) The drugs that you were given will stay in your system until tomorrow so for the next 24 hours you should not:  A) Drive an automobile B) Make any legal decisions C) Drink any alcoholic beverage   2) You may resume regular meals tomorrow.  Today it is better to start with liquids and gradually work up to solid foods.  You may eat anything you prefer, but it is better to start with liquids, then soup and crackers, and gradually work up to solid foods.   3) Please notify your doctor immediately if you have any unusual bleeding, trouble breathing, redness and pain at the surgery site, drainage, fever, or pain not relieved by medication.    4) Additional Instructions:   Please contact your physician with any problems or Same Day Surgery at [phone], Monday through Friday 6 am to 4 pm, or [place] at [facility] Main number at [phone].

## 2018-01-29 NOTE — Op Note (Signed)
Date of procedure: 01/29/18  Preoperative diagnosis:  1. Right obstructing ureteral calculus 2. History of sepsis of urinary source  Postoperative diagnosis:  1. Same as above  Procedure: 1. Cystoscopy 2. Right ureteroscopy 3. Right ureteral stent exchange 4. Right retrograde pyelogram 5. Basket extraction of stone fragment  Surgeon: Vanna Scotland, MD  Anesthesia: General  Complications: None  Intraoperative findings: Obstructing proximal ureteral stone treated as well as some nonobstructing calculi.  Stent relatively encrusted.  Debris in upper tract collecting system noted.  EBL: Minimal  Specimens: Stone fragment  Drains: 6 x 24 French double-J ureteral stent on right, tether left in place  Indication: RUBYE BIA is a 73 y.o. patient with an obstructing right proximal ureteral stone who underwent emergent ureteral stent placement back in October.  She returns today for definitive management of her stone.  She did receive cardiac clearance.  After reviewing the management options for treatment, she elected to proceed with the above surgical procedure(s). We have discussed the potential benefits and risks of the procedure, side effects of the proposed treatment, the likelihood of the patient achieving the goals of the procedure, and any potential problems that might occur during the procedure or recuperation. Informed consent has been obtained.  Description of procedure:  The patient was taken to the operating room and general anesthesia was induced.  The patient was placed in the dorsal lithotomy position, prepped and draped in the usual sterile fashion, and preoperative antibiotics were administered. A preoperative time-out was performed.   A 21 French scope was advanced per urethra into the bladder.  The distal coil of the stent was grasped and brought to level the urethral meatus.  The stent was relatively encrusted and debris laden.  The stent was then cannulated using  a sensor wire up to level the kidney.  The stent was then removed and discarded.  The safety wire was snapped in place.  The stone in the proximal ureter could be seen alongside the stent.  I was able to get a 4.5 semirigid ureteroscope up to level of the stone fragment the stone using the 273 m laser fiber and the settings of 0.8 J and 10 Hz.  Each of these pieces were then extracted via nitinol basket.  Once the ureter was cleared of all stone debris, a second Super Stiff wire was advanced up to level the kidney.  This was used as a working wire.  A Cook 12/14 French access sheath was then advanced to the proximal ureter and the Super Stiff wire and inner cannula were removed.  A semirigid flexible digital ureteroscope was then used to evaluate the upper tract.  There was a good amount of debris noted within the upper tract collecting system which was fluffy and white in appearance, possibly fungal or dissolved clot.  I did encounter a few additional stone fragments which were treated using the laser fiber and basket until the entire collecting system was cleared of all stone burden.  A final retrograde pyelogram created a roadmap to ensure that each every calyx was directly visualized and cleared of all debris.  The scope was then backed in length the ureter inspecting along the way.  There is no ureteral injury or additional fragments identified.  A 6 oh 24 French double-J ureteral stent was then advanced over the wire to level the kidney.  The wires partially drawn till focal stone within the renal pelvis as well as in the bladder.  The bladder was then drained.  The stent string was affixed to the patient's left inner thigh using muscle and Tegaderm.  The patient was then clean and dry, repositioned in supine position, reversed anesthesia and taken the PACU in stable condition.  Plan: She remove her own stent on Friday.  She will follow-up in 4 weeks a renal ultrasound prior.  Vanna Scotland, M.D.

## 2018-01-29 NOTE — Interval H&P Note (Signed)
History and Physical Interval Note:  01/29/2018 12:17 PM  Kristina Archer  has presented today for surgery, with the diagnosis of right ureteral stone, right nephrolithiasis  The various methods of treatment have been discussed with the patient and family. After consideration of risks, benefits and other options for treatment, the patient has consented to  Procedure(s): CYSTOSCOPY/URETEROSCOPY/HOLMIUM LASER/STENT Exchange (Right) as a surgical intervention .  The patient's history has been reviewed, patient examined, no change in status, stable for surgery.  I have reviewed the patient's chart and labs.  Questions were answered to the patient's satisfaction.    RRR CTAB  Vanna Scotland

## 2018-01-29 NOTE — Anesthesia Preprocedure Evaluation (Addendum)
Anesthesia Evaluation  Patient identified by MRN, date of birth, ID band Patient awake    Reviewed: Allergy & Precautions, H&P , NPO status , Patient's Chart, lab work & pertinent test results  History of Anesthesia Complications Negative for: history of anesthetic complications  Airway Mallampati: III  TM Distance: <3 FB Neck ROM: limited    Dental  (+) Poor Dentition, Missing, Upper Dentures, Lower Dentures, Dental Advidsory Given   Pulmonary neg shortness of breath, former smoker,           Cardiovascular Exercise Tolerance: Good hypertension, +CHF  + dysrhythmias Atrial Fibrillation      Neuro/Psych PSYCHIATRIC DISORDERS Anxiety  Neuromuscular disease    GI/Hepatic Neg liver ROS, GERD  Medicated and Controlled,  Endo/Other  diabetes, Type 2  Renal/GU Renal disease (kidney stones)     Musculoskeletal   Abdominal   Peds  Hematology negative hematology ROS (+)   Anesthesia Other Findings Past Medical History: No date: Anxiety No date: Cardiomyopathy [facility])     Comment:  a. 10/2017 Echo: EF 20-25%, diff HK, mild MR. Nl RV               size. No date: DDD (degenerative disc disease), lumbar No date: Diverticulosis No date: Elevated troponin No date: Essential hypertension No date: GERD (gastroesophageal reflux disease) No date: History of kidney stones No date: Hyperlipidemia No date: LBBB (left bundle branch block) No date: PAF (paroxysmal atrial fibrillation) (HCC)     Comment:  a.  Diagnosed 10/2017; b.  On Eliquis and amiodarone; c.              CHADS2VASc => 5 (CHF, HTN, age x 1, DM, female) No date: Pneumonia No date: Sciatica No date: Type II diabetes mellitus (HCC)     Reproductive/Obstetrics negative OB ROS                             Anesthesia Physical  Anesthesia Plan  ASA: III  Anesthesia Plan: General ETT   Post-op Pain Management:    Induction:  Intravenous  PONV Risk Score and Plan: Ondansetron, Dexamethasone and Treatment may vary due to age or medical condition  Airway Management Planned: Oral ETT  Additional Equipment:   Intra-op Plan:   Post-operative Plan: Extubation in OR  Informed Consent: I have reviewed the patients History and Physical, chart, labs and discussed the procedure including the risks, benefits and alternatives for the proposed anesthesia with the patient or authorized representative who has indicated his/her understanding and acceptance.     Dental Advisory Given  Plan Discussed with: Anesthesiologist, CRNA and Surgeon  Anesthesia Plan Comments: (Patient consented for risks of anesthesia including but not limited to:  - adverse reactions to medications - damage to teeth, lips or other oral mucosa - sore throat or hoarseness - Damage to heart, brain, lungs or loss of life  Patient voiced understanding.  The patient has been evaluated by cardiology ( Dr. Mariah Milling ) in late December and repeat echo was performed.  Thje EF remains low, 20-25%, but the patient has been stable and cardiology recognizes that the urological procedure needs to be performed, and so the ischemic eval will be deferred until after the urologic procedure.)       Anesthesia Quick Evaluation

## 2018-01-29 NOTE — Anesthesia Post-op Follow-up Note (Signed)
Anesthesia QCDR form completed.        

## 2018-01-29 NOTE — Telephone Encounter (Signed)
-----   Message from Vanna Scotland, MD sent at 01/29/2018  2:14 PM EST ----- Regarding: f/u 4 weeks with RUS prior Ordered from PACU

## 2018-01-30 ENCOUNTER — Encounter: Payer: Self-pay | Admitting: Urology

## 2018-01-30 ENCOUNTER — Telehealth: Payer: Self-pay | Admitting: Urology

## 2018-01-30 NOTE — Telephone Encounter (Signed)
Friday.  This was on her discharge summary.  She can come for nurse visit for removal if needed.  Vanna Scotland, MD

## 2018-01-30 NOTE — Telephone Encounter (Signed)
Pt wants to know when she needs to take her stent out.  Also, she said she is not crazy about taking it out herself.  She has a follow up scheduled for 2/18.

## 2018-01-30 NOTE — Telephone Encounter (Signed)
When does Kristina Archer's stent need to come out? Please advise.

## 2018-01-30 NOTE — Telephone Encounter (Signed)
Called pt informed her of the information below. Pt gave verbal understanding. Added pt to nurse schedule for stent removal.

## 2018-01-31 NOTE — Anesthesia Postprocedure Evaluation (Signed)
Anesthesia Post Note  Patient: Kristina Archer  Procedure(s) Performed: CYSTOSCOPY/URETEROSCOPY/HOLMIUM LASER/STENT Exchange (Right Ureter) CYSTOSCOPY WITH RETROGRADE PYELOGRAM (Right Ureter)  Patient location during evaluation: PACU Anesthesia Type: General Level of consciousness: awake and alert Pain management: pain level controlled Vital Signs Assessment: post-procedure vital signs reviewed and stable Respiratory status: spontaneous breathing, nonlabored ventilation, respiratory function stable and patient connected to nasal cannula oxygen Cardiovascular status: blood pressure returned to baseline and stable Postop Assessment: no apparent nausea or vomiting Anesthetic complications: no     Last Vitals:  Vitals:   01/29/18 1502 01/29/18 1515  BP: (!) 140/58 (!) 143/66  Pulse: (!) 59 60  Resp: 14 16  Temp:  36.6 C  SpO2: 95% 98%    Last Pain:  Vitals:   01/29/18 1515  TempSrc:   PainSc: 0-No pain                 Christia Reading

## 2018-02-01 ENCOUNTER — Encounter: Payer: Self-pay | Admitting: Emergency Medicine

## 2018-02-01 ENCOUNTER — Emergency Department: Payer: Medicare Other

## 2018-02-01 ENCOUNTER — Other Ambulatory Visit: Payer: Self-pay

## 2018-02-01 ENCOUNTER — Inpatient Hospital Stay
Admission: EM | Admit: 2018-02-01 | Discharge: 2018-02-04 | DRG: 853 | Disposition: A | Discharge status: Home Health | Payer: Medicare Other | Attending: Internal Medicine | Admitting: Internal Medicine

## 2018-02-01 DIAGNOSIS — R630 Anorexia: Secondary | ICD-10-CM | POA: Diagnosis present

## 2018-02-01 DIAGNOSIS — Z79899 Other long term (current) drug therapy: Secondary | ICD-10-CM

## 2018-02-01 DIAGNOSIS — J9601 Acute respiratory failure with hypoxia: Secondary | ICD-10-CM | POA: Diagnosis present

## 2018-02-01 DIAGNOSIS — I5030 Unspecified diastolic (congestive) heart failure: Secondary | ICD-10-CM | POA: Diagnosis not present

## 2018-02-01 DIAGNOSIS — N1330 Unspecified hydronephrosis: Secondary | ICD-10-CM | POA: Diagnosis present

## 2018-02-01 DIAGNOSIS — E785 Hyperlipidemia, unspecified: Secondary | ICD-10-CM | POA: Diagnosis present

## 2018-02-01 DIAGNOSIS — I447 Left bundle-branch block, unspecified: Secondary | ICD-10-CM | POA: Diagnosis present

## 2018-02-01 DIAGNOSIS — I480 Paroxysmal atrial fibrillation: Secondary | ICD-10-CM | POA: Diagnosis present

## 2018-02-01 DIAGNOSIS — A419 Sepsis, unspecified organism: Secondary | ICD-10-CM | POA: Diagnosis not present

## 2018-02-01 DIAGNOSIS — N390 Urinary tract infection, site not specified: Secondary | ICD-10-CM

## 2018-02-01 DIAGNOSIS — Z8249 Family history of ischemic heart disease and other diseases of the circulatory system: Secondary | ICD-10-CM

## 2018-02-01 DIAGNOSIS — M5136 Other intervertebral disc degeneration, lumbar region: Secondary | ICD-10-CM | POA: Diagnosis present

## 2018-02-01 DIAGNOSIS — I10 Essential (primary) hypertension: Secondary | ICD-10-CM | POA: Diagnosis not present

## 2018-02-01 DIAGNOSIS — I110 Hypertensive heart disease with heart failure: Secondary | ICD-10-CM | POA: Diagnosis present

## 2018-02-01 DIAGNOSIS — Z885 Allergy status to narcotic agent status: Secondary | ICD-10-CM

## 2018-02-01 DIAGNOSIS — Z9049 Acquired absence of other specified parts of digestive tract: Secondary | ICD-10-CM

## 2018-02-01 DIAGNOSIS — I252 Old myocardial infarction: Secondary | ICD-10-CM

## 2018-02-01 DIAGNOSIS — I2510 Atherosclerotic heart disease of native coronary artery without angina pectoris: Secondary | ICD-10-CM | POA: Diagnosis present

## 2018-02-01 DIAGNOSIS — F419 Anxiety disorder, unspecified: Secondary | ICD-10-CM | POA: Diagnosis present

## 2018-02-01 DIAGNOSIS — I420 Dilated cardiomyopathy: Secondary | ICD-10-CM | POA: Diagnosis present

## 2018-02-01 DIAGNOSIS — K219 Gastro-esophageal reflux disease without esophagitis: Secondary | ICD-10-CM | POA: Diagnosis present

## 2018-02-01 DIAGNOSIS — E876 Hypokalemia: Secondary | ICD-10-CM | POA: Diagnosis present

## 2018-02-01 DIAGNOSIS — E1165 Type 2 diabetes mellitus with hyperglycemia: Secondary | ICD-10-CM | POA: Diagnosis present

## 2018-02-01 DIAGNOSIS — R509 Fever, unspecified: Secondary | ICD-10-CM | POA: Diagnosis present

## 2018-02-01 DIAGNOSIS — M549 Dorsalgia, unspecified: Secondary | ICD-10-CM

## 2018-02-01 DIAGNOSIS — Z79891 Long term (current) use of opiate analgesic: Secondary | ICD-10-CM

## 2018-02-01 DIAGNOSIS — Z87891 Personal history of nicotine dependence: Secondary | ICD-10-CM

## 2018-02-01 DIAGNOSIS — Z87440 Personal history of urinary (tract) infections: Secondary | ICD-10-CM

## 2018-02-01 DIAGNOSIS — I5023 Acute on chronic systolic (congestive) heart failure: Secondary | ICD-10-CM | POA: Diagnosis present

## 2018-02-01 DIAGNOSIS — Z7901 Long term (current) use of anticoagulants: Secondary | ICD-10-CM

## 2018-02-01 DIAGNOSIS — N202 Calculus of kidney with calculus of ureter: Secondary | ICD-10-CM | POA: Diagnosis present

## 2018-02-01 DIAGNOSIS — Z90710 Acquired absence of both cervix and uterus: Secondary | ICD-10-CM

## 2018-02-01 DIAGNOSIS — R0602 Shortness of breath: Secondary | ICD-10-CM

## 2018-02-01 DIAGNOSIS — Z87442 Personal history of urinary calculi: Secondary | ICD-10-CM

## 2018-02-01 LAB — CBC WITH DIFFERENTIAL/PLATELET
Abs Immature Granulocytes: 0.03 10*3/uL (ref 0.00–0.07)
BASOS PCT: 0 %
Basophils Absolute: 0 10*3/uL (ref 0.0–0.1)
Eosinophils Absolute: 0 10*3/uL (ref 0.0–0.5)
Eosinophils Relative: 0 %
HCT: 30.7 % — ABNORMAL LOW (ref 36.0–46.0)
Hemoglobin: 9.9 g/dL — ABNORMAL LOW (ref 12.0–15.0)
Immature Granulocytes: 0 %
Lymphocytes Relative: 13 %
Lymphs Abs: 1 10*3/uL (ref 0.7–4.0)
MCH: 26.1 pg (ref 26.0–34.0)
MCHC: 32.2 g/dL (ref 30.0–36.0)
MCV: 80.8 fL (ref 80.0–100.0)
Monocytes Absolute: 0.9 10*3/uL (ref 0.1–1.0)
Monocytes Relative: 13 %
Neutro Abs: 5.6 10*3/uL (ref 1.7–7.7)
Neutrophils Relative %: 74 %
Platelets: 130 10*3/uL — ABNORMAL LOW (ref 150–400)
RBC: 3.8 MIL/uL — ABNORMAL LOW (ref 3.87–5.11)
RDW: 14.5 % (ref 11.5–15.5)
WBC: 7.5 10*3/uL (ref 4.0–10.5)
nRBC: 0 % (ref 0.0–0.2)

## 2018-02-01 LAB — BLOOD GAS, VENOUS
Acid-Base Excess: 2.1 mmol/L — ABNORMAL HIGH (ref 0.0–2.0)
Bicarbonate: 25.5 mmol/L (ref 20.0–28.0)
O2 Saturation: 94.5 %
Patient temperature: 37
pCO2, Ven: 35 mmHg — ABNORMAL LOW (ref 44.0–60.0)
pH, Ven: 7.47 — ABNORMAL HIGH (ref 7.250–7.430)
pO2, Ven: 68 mmHg — ABNORMAL HIGH (ref 32.0–45.0)

## 2018-02-01 LAB — GLUCOSE, CAPILLARY
Glucose-Capillary: 222 mg/dL — ABNORMAL HIGH (ref 70–99)
Glucose-Capillary: 217 mg/dL — ABNORMAL HIGH (ref 70–99)
Glucose-Capillary: 181 mg/dL — ABNORMAL HIGH (ref 70–99)
Glucose-Capillary: 180 mg/dL — ABNORMAL HIGH (ref 70–99)
Glucose-Capillary: 157 mg/dL — ABNORMAL HIGH (ref 70–99)

## 2018-02-01 LAB — URINALYSIS, ROUTINE W REFLEX MICROSCOPIC
BACTERIA UA: NONE SEEN
Bilirubin Urine: NEGATIVE
Glucose, UA: 50 mg/dL — AB
KETONES UR: NEGATIVE mg/dL
Nitrite: NEGATIVE
Protein, ur: 100 mg/dL — AB
Specific Gravity, Urine: 1.009 (ref 1.005–1.030)
WBC, UA: >50 WBC/hpf — ABNORMAL HIGH (ref 0–5)
pH: 7 (ref 5.0–8.0)

## 2018-02-01 LAB — PROTIME-INR
INR: 1.35
Prothrombin Time: 16.5 seconds — ABNORMAL HIGH (ref 11.4–15.2)

## 2018-02-01 LAB — COMPREHENSIVE METABOLIC PANEL
ALT: 11 U/L (ref 0–44)
AST: 14 U/L — ABNORMAL LOW (ref 15–41)
Albumin: 3.3 g/dL — ABNORMAL LOW (ref 3.5–5.0)
Alkaline Phosphatase: 62 U/L (ref 38–126)
Anion gap: 8 (ref 5–15)
BILIRUBIN TOTAL: 1.3 mg/dL — AB (ref 0.3–1.2)
BUN: 12 mg/dL (ref 8–23)
CO2: 24 mmol/L (ref 22–32)
Calcium: 9.3 mg/dL (ref 8.9–10.3)
Chloride: 101 mmol/L (ref 98–111)
Creatinine, Ser: 1.03 mg/dL — ABNORMAL HIGH (ref 0.44–1.00)
GFR calc Af Amer: >60 mL/min (ref >60)
GFR calc non Af Amer: 55 mL/min — ABNORMAL LOW (ref >60)
Glucose, Bld: 269 mg/dL — ABNORMAL HIGH (ref 70–99)
POTASSIUM: 3.1 mmol/L — AB (ref 3.5–5.1)
Sodium: 133 mmol/L — ABNORMAL LOW (ref 135–145)
Total Protein: 7 g/dL (ref 6.5–8.1)

## 2018-02-01 LAB — INFLUENZA PANEL BY PCR (TYPE A & B)
Influenza A By PCR: NEGATIVE
Influenza B By PCR: NEGATIVE

## 2018-02-01 LAB — TROPONIN I: Troponin I: 0.06 ng/mL (ref <0.03)

## 2018-02-01 LAB — PROCALCITONIN: Procalcitonin: 0.11 ng/mL

## 2018-02-01 LAB — LIPASE, BLOOD: Lipase: 39 U/L (ref 11–51)

## 2018-02-01 LAB — HEMOGLOBIN A1C
Hgb A1c MFr Bld: 6.5 % — ABNORMAL HIGH (ref 4.8–5.6)
Mean Plasma Glucose: 139.85 mg/dL

## 2018-02-01 LAB — LACTIC ACID, PLASMA: Lactic Acid, Venous: 0.9 mmol/L (ref 0.5–1.9)

## 2018-02-01 LAB — TSH: TSH: 0.544 u[IU]/mL (ref 0.350–4.500)

## 2018-02-01 MED ORDER — ACETAMINOPHEN 650 MG RE SUPP: 650.0000 mg | Freq: Four times a day (QID) | RECTAL | Status: DC | PRN

## 2018-02-01 MED ORDER — ONDANSETRON HCL 4 MG PO TABS: 4.0000 mg | ORAL_TABLET | Freq: Four times a day (QID) | ORAL | Status: DC | PRN

## 2018-02-01 MED ORDER — PAROXETINE HCL 20 MG PO TABS
20.0000 mg | ORAL_TABLET | Freq: Every day | ORAL | Status: DC
  Administered 2018-02-01 – 2018-02-03 (×3): 20 mg via ORAL
  Filled 2018-02-01 (×4): qty 1

## 2018-02-01 MED ORDER — ACETAMINOPHEN 325 MG PO TABS: 650.0000 mg | ORAL_TABLET | Freq: Four times a day (QID) | ORAL | Status: DC | PRN

## 2018-02-01 MED ORDER — ACETAMINOPHEN 500 MG PO TABS
1000.0000 mg | ORAL_TABLET | Freq: Once | ORAL | Status: AC
  Administered 2018-02-01: 1000 mg via ORAL
  Filled 2018-02-01: qty 2

## 2018-02-01 MED ORDER — SODIUM CHLORIDE 0.9 % IV SOLN
1.0000 g | Freq: Three times a day (TID) | INTRAVENOUS | Status: DC
  Administered 2018-02-01 – 2018-02-03 (×7): 1 g via INTRAVENOUS
  Filled 2018-02-01 (×12): qty 1

## 2018-02-01 MED ORDER — ONDANSETRON HCL 4 MG/2ML IJ SOLN: 4.0000 mg | Freq: Four times a day (QID) | INTRAMUSCULAR | Status: DC | PRN

## 2018-02-01 MED ORDER — POTASSIUM CHLORIDE CRYS ER 20 MEQ PO TBCR
40.0000 meq | EXTENDED_RELEASE_TABLET | Freq: Once | ORAL | Status: AC
  Administered 2018-02-01: 40 meq via ORAL
  Filled 2018-02-01: qty 2

## 2018-02-01 MED ORDER — ENOXAPARIN SODIUM 40 MG/0.4ML ~~LOC~~ SOLN
40.0000 mg | SUBCUTANEOUS | Status: DC
  Administered 2018-02-01: 40 mg via SUBCUTANEOUS
  Filled 2018-02-01: qty 0.4

## 2018-02-01 MED ORDER — TRAZODONE HCL 50 MG PO TABS
50.0000 mg | ORAL_TABLET | Freq: Every day | ORAL | Status: DC
  Administered 2018-02-01 – 2018-02-03 (×3): 50 mg via ORAL
  Filled 2018-02-01 (×3): qty 1

## 2018-02-01 MED ORDER — HYDROCODONE-ACETAMINOPHEN 5-325 MG PO TABS
1.0000 | ORAL_TABLET | Freq: Four times a day (QID) | ORAL | Status: DC | PRN
  Administered 2018-02-01 – 2018-02-02 (×2): 1 via ORAL
  Administered 2018-02-02: 2 via ORAL
  Administered 2018-02-03 (×4): 1 via ORAL
  Filled 2018-02-01: qty 1
  Filled 2018-02-01: qty 2
  Filled 2018-02-01 (×4): qty 1
  Filled 2018-02-01: qty 2

## 2018-02-01 MED ORDER — GLUCERNA SHAKE PO LIQD
237.0000 mL | Freq: Two times a day (BID) | ORAL | Status: DC
  Administered 2018-02-01 – 2018-02-02 (×4): 237 mL via ORAL

## 2018-02-01 MED ORDER — CARVEDILOL 3.125 MG PO TABS
3.1250 mg | ORAL_TABLET | Freq: Two times a day (BID) | ORAL | Status: DC
  Administered 2018-02-01 – 2018-02-04 (×6): 3.125 mg via ORAL
  Filled 2018-02-01 (×5): qty 1

## 2018-02-01 MED ORDER — LISINOPRIL 5 MG PO TABS
5.0000 mg | ORAL_TABLET | Freq: Every day | ORAL | Status: DC
  Administered 2018-02-01 – 2018-02-04 (×4): 5 mg via ORAL
  Filled 2018-02-01 (×4): qty 1

## 2018-02-01 MED ORDER — SODIUM CHLORIDE 0.9 % IV SOLN: 1.0000 g | Freq: Once | INTRAVENOUS | Status: DC

## 2018-02-01 MED ORDER — SODIUM CHLORIDE 0.9 % IV SOLN
INTRAVENOUS | Status: DC
  Administered 2018-02-01 – 2018-02-02 (×2): via INTRAVENOUS

## 2018-02-01 MED ORDER — FUROSEMIDE 40 MG PO TABS
20.0000 mg | ORAL_TABLET | Freq: Every day | ORAL | Status: DC
  Administered 2018-02-01 – 2018-02-03 (×3): 20 mg via ORAL
  Filled 2018-02-01 (×3): qty 1

## 2018-02-01 MED ORDER — ADULT MULTIVITAMIN W/MINERALS CH
1.0000 | ORAL_TABLET | Freq: Every day | ORAL | Status: DC
  Administered 2018-02-01 – 2018-02-04 (×4): 1 via ORAL
  Filled 2018-02-01 (×4): qty 1

## 2018-02-01 MED ORDER — IOHEXOL 300 MG/ML  SOLN
100.0000 mL | Freq: Once | INTRAMUSCULAR | Status: AC | PRN
  Administered 2018-02-01: 100 mL via INTRAVENOUS

## 2018-02-01 MED ORDER — ALUM & MAG HYDROXIDE-SIMETH 200-200-20 MG/5ML PO SUSP
30.0000 mL | Freq: Four times a day (QID) | ORAL | Status: DC | PRN
  Administered 2018-02-01: 07:00:00 30 mL via ORAL
  Filled 2018-02-01: qty 30

## 2018-02-01 MED ORDER — SODIUM CHLORIDE 0.9 % IV BOLUS
2000.0000 mL | Freq: Once | INTRAVENOUS | Status: AC
  Administered 2018-02-01: 2000 mL via INTRAVENOUS

## 2018-02-01 MED ORDER — IBUPROFEN 600 MG PO TABS
600.0000 mg | ORAL_TABLET | Freq: Once | ORAL | Status: AC
  Administered 2018-02-01: 600 mg via ORAL
  Filled 2018-02-01: qty 1

## 2018-02-01 MED ORDER — INSULIN ASPART 100 UNIT/ML ~~LOC~~ SOLN
0.0000 [IU] | Freq: Three times a day (TID) | SUBCUTANEOUS | Status: DC
  Administered 2018-02-01 (×2): 2 [IU] via SUBCUTANEOUS
  Administered 2018-02-01: 3 [IU] via SUBCUTANEOUS
  Administered 2018-02-02 – 2018-02-03 (×4): 2 [IU] via SUBCUTANEOUS
  Administered 2018-02-03: 13:00:00 3 [IU] via SUBCUTANEOUS
  Administered 2018-02-04: 08:00:00 2 [IU] via SUBCUTANEOUS
  Filled 2018-02-01 (×6): qty 1

## 2018-02-01 MED ORDER — ROSUVASTATIN CALCIUM 20 MG PO TABS
40.0000 mg | ORAL_TABLET | Freq: Every day | ORAL | Status: DC
  Administered 2018-02-01 – 2018-02-04 (×4): 40 mg via ORAL
  Filled 2018-02-01 (×4): qty 2

## 2018-02-01 MED ORDER — DOCUSATE SODIUM 100 MG PO CAPS
100.0000 mg | ORAL_CAPSULE | Freq: Two times a day (BID) | ORAL | Status: DC
  Administered 2018-02-01 – 2018-02-04 (×6): 100 mg via ORAL
  Filled 2018-02-01 (×6): qty 1

## 2018-02-01 MED ORDER — AMIODARONE HCL 200 MG PO TABS
200.0000 mg | ORAL_TABLET | Freq: Every day | ORAL | Status: DC
  Administered 2018-02-01 – 2018-02-04 (×4): 200 mg via ORAL
  Filled 2018-02-01 (×4): qty 1

## 2018-02-01 MED ORDER — INSULIN ASPART 100 UNIT/ML ~~LOC~~ SOLN
0.0000 [IU] | Freq: Every day | SUBCUTANEOUS | Status: DC
  Administered 2018-02-02: 4 [IU] via SUBCUTANEOUS
  Administered 2018-02-03: 2 [IU] via SUBCUTANEOUS
  Filled 2018-02-01 (×2): qty 1

## 2018-02-01 MED ORDER — LINAGLIPTIN 5 MG PO TABS
5.0000 mg | ORAL_TABLET | Freq: Every day | ORAL | Status: DC
  Administered 2018-02-01 – 2018-02-04 (×4): 5 mg via ORAL
  Filled 2018-02-01 (×4): qty 1

## 2018-02-01 MED ORDER — OXYBUTYNIN CHLORIDE 5 MG PO TABS: 5.0000 mg | ORAL_TABLET | Freq: Three times a day (TID) | ORAL | Status: DC | PRN

## 2018-02-01 MED ORDER — ALBUTEROL SULFATE (2.5 MG/3ML) 0.083% IN NEBU: 2.5000 mg | INHALATION_SOLUTION | RESPIRATORY_TRACT | Status: DC | PRN

## 2018-02-01 MED ORDER — TAMSULOSIN HCL 0.4 MG PO CAPS
0.4000 mg | ORAL_CAPSULE | Freq: Every day | ORAL | Status: DC
  Administered 2018-02-01 – 2018-02-04 (×4): 0.4 mg via ORAL
  Filled 2018-02-01 (×4): qty 1

## 2018-02-01 NOTE — ED Provider Notes (Signed)
[facility] Emergency Department Provider Note  ____________________________________________   First MD Initiated Contact with Patient 02/01/18 [phone]     (approximate)  I have reviewed the triage vital signs and the nursing notes.   HISTORY  Chief Complaint Fever; Nausea; and Emesis   HPI Kristina Archer is a 73 y.o. female who comes to the emergency department via EMS with 2 days of progressive nausea vomiting and fever at home.  Her symptoms been gradual onset slowly progressive are now severe.  When EMS arrived they noted she was saturating 91% on room air.  She has no known pulmonary disease.  She does have a known history of dilated cardiomyopathy.   3 days ago she had a cystoscopy and extraction of stone fragments for her right sided kidney stone by Dr. [name].  She has a stent currently in place and according to the patient was told that she should remove it on her own although she has been hesitant to.  She does report urinary incontinence.  She denies chest pain.  She also reports back pain.  She is had a cholecystectomy as well as an abdominal hysterectomy and no other abdominal surgeries.  Denies diarrhea.  Her symptoms came on gradually are slowly progressive are now constant and severe.  They are worse with movement and somewhat improved with rest.   Past Medical History:  Diagnosis Date  . Anxiety   . Cardiomyopathy (HCC)    a. 10/2017 Echo: EF 20-25%, diff HK, mild MR. Nl RV size.  . DDD (degenerative disc disease), lumbar   . Diverticulosis   . Elevated troponin   . Essential hypertension   . GERD (gastroesophageal reflux disease)   . History of kidney stones   . Hyperlipidemia   . LBBB (left bundle branch block)   . PAF (paroxysmal atrial fibrillation) (HCC)    a.  Diagnosed 10/2017; b.  On Eliquis and amiodarone; c. CHADS2VASc => 5 (CHF, HTN, age x 1, DM, female)  . Pneumonia   . Sciatica   . Type II diabetes mellitus [facility](HCC)      Patient Active Problem List   Diagnosis Date Noted  . Urinary tract infection with hematuria   . Pneumonia of both lower lobes due to infectious organism (HCC)   . Sepsis (HCC) 12/18/2017  . CHF (congestive heart failure) (HCC)   . Ureterolithiasis 11/02/2017    Past Surgical History:  Procedure Laterality Date  . ABDOMINAL HYSTERECTOMY    . CHOLECYSTECTOMY    . CYSTOSCOPY W/ RETROGRADES Bilateral 05/24/2017   Procedure: CYSTOSCOPY WITH RETROGRADE PYELOGRAM;  Surgeon: [name], [name], MD;  Location: [facility] ORS;  Service: Urology;  Laterality: Bilateral;  . CYSTOSCOPY W/ RETROGRADES Right 01/29/2018   Procedure: CYSTOSCOPY WITH RETROGRADE PYELOGRAM;  Surgeon: [name], [name], MD;  Location: [facility] ORS;  Service: Urology;  Laterality: Right;  . CYSTOSCOPY W/ URETERAL STENT PLACEMENT Right 11/02/2017   Procedure: CYSTOSCOPY WITH RETROGRADE PYELOGRAM/URETERAL STENT PLACEMENT;  Surgeon: [name], [name], MD;  Location: [facility] ORS;  Service: Urology;  Laterality: Right;  . CYSTOSCOPY/URETEROSCOPY/HOLMIUM LASER/STENT PLACEMENT Left 05/24/2017   Procedure: CYSTOSCOPY/URETEROSCOPY/HOLMIUM LASER/STENT PLACEMENT;  Surgeon: [name], [name], MD;  Location: [facility] ORS;  Service: Urology;  Laterality: Left;  . CYSTOSCOPY/URETEROSCOPY/HOLMIUM LASER/STENT PLACEMENT Right 01/29/2018   Procedure: CYSTOSCOPY/URETEROSCOPY/HOLMIUM LASER/STENT Exchange;  Surgeon: [name], [name], MD;  Location: [facility] ORS;  Service: Urology;  Laterality: Right;    Prior to Admission medications   Medication Sig Start Date End Date Taking? Authorizing Provider  acetaminophen (  TYLENOL) 325 MG tablet Take 2 tablets (650 mg total) by mouth every 6 (six) hours as needed for mild pain (or Fever >/= 101). 12/22/17  Yes Gouru, Deanna Artis, MD  albuterol (PROVENTIL HFA;VENTOLIN HFA) 108 (90 Base) MCG/ACT inhaler Inhale 2 puffs into the lungs every 6 (six) hours as needed for wheezing or shortness of breath. 11/13/17  Yes Katha Hamming, MD   amiodarone (PACERONE) 200 MG tablet Take 1 tablet (200 mg total) by mouth daily. 01/09/18  Yes Dunn, Raymon Mutton, PA-C  apixaban (ELIQUIS) 5 MG TABS tablet Take 1 tablet (5 mg total) by mouth 2 (two) times daily. 11/13/17  Yes Katha Hamming, MD  carvedilol (COREG) 3.125 MG tablet Take 1 tablet (3.125 mg total) by mouth 2 (two) times daily with a meal. 11/13/17  Yes Katha Hamming, MD  docusate sodium (COLACE) 100 MG capsule Take 1 capsule (100 mg total) by mouth 2 (two) times daily. 01/29/18  Yes Vanna Scotland, MD  feeding supplement, GLUCERNA SHAKE, (GLUCERNA SHAKE) LIQD Take 237 mLs by mouth 2 (two) times daily between meals. 12/22/17  Yes Gouru, Deanna Artis, MD  furosemide (LASIX) 20 MG tablet Take 20 mg by mouth daily.   Yes [provider]  HYDROcodone-acetaminophen (NORCO/VICODIN) 5-325 MG tablet Take 1-2 tablets by mouth every 6 (six) hours as needed for moderate pain. 01/29/18  Yes Vanna Scotland, MD  lisinopril (PRINIVIL,ZESTRIL) 5 MG tablet Take 1 tablet (5 mg total) by mouth daily. 01/11/18 04/11/18 Yes Dunn, Raymon Mutton, PA-C  metFORMIN (GLUCOPHAGE-XR) 500 MG 24 hr tablet Take 500 mg by mouth 2 (two) times daily.   Yes [provider]  Multiple Vitamin (MULTIVITAMIN WITH MINERALS) TABS tablet Take 1 tablet by mouth daily.   Yes [provider]  nitrofurantoin, macrocrystal-monohydrate, (MACROBID) 100 MG capsule Take 1 capsule (100 mg total) by mouth every 12 (twelve) hours. 01/15/18  Yes Vanna Scotland, MD  PARoxetine (PAXIL) 20 MG tablet Take 20 mg by mouth at bedtime.   Yes [provider]  rosuvastatin (CRESTOR) 40 MG tablet Take 1 tablet (40 mg total) by mouth daily. 11/13/17  Yes Katha Hamming, MD  sitaGLIPtin (JANUVIA) 100 MG tablet Take 100 mg by mouth daily.   Yes [provider]  tamsulosin (FLOMAX) 0.4 MG CAPS capsule Take 1 capsule (0.4 mg total) by mouth daily. 01/29/18  Yes Vanna Scotland, MD  traZODone (DESYREL) 50 MG tablet Take 1  tablet by mouth at bedtime. 11/30/17  Yes [provider]  oxybutynin (DITROPAN) 5 MG tablet Take 1 tablet (5 mg total) by mouth every 8 (eight) hours as needed for bladder spasms. 01/29/18   Vanna Scotland, MD    Allergies Codeine  Family History  Problem Relation Age of Onset  . Heart attack Father 39  . Bladder Cancer Neg Hx   . Kidney cancer Neg Hx     Social History Social History   Tobacco Use  . Smoking status: Former Smoker    Packs/day: 1.50    Years: 15.00    Pack years: 22.50    Types: Cigarettes    Last attempt to quit: 05/12/1997    Years since quitting: 20.7  . Smokeless tobacco: Never Used  Substance Use Topics  . Alcohol use: Not Currently  . Drug use: Never    Review of Systems Constitutional: Positive for fevers and chills Eyes: No visual changes. ENT: No sore throat. Cardiovascular: Denies chest pain. Respiratory: Denies shortness of breath. Gastrointestinal: Positive for abdominal pain.  Positive for nausea, positive  for vomiting.  No diarrhea.  No constipation. Genitourinary: Positive for urinary incontinence Musculoskeletal: Positive for back pain. Skin: Negative for rash. Neurological: Negative for headaches, focal weakness or numbness.   ____________________________________________   PHYSICAL EXAM:  VITAL SIGNS: ED Triage Vitals [02/01/18 0202]  Enc Vitals Group     BP      Pulse Rate 77     Resp (!) 22     Temp (!) 102.8 F (39.3 C)     Temp Source Rectal     SpO2 91 %     Weight      Height      Head Circumference      Peak Flow      Pain Score      Pain Loc      Pain Edu?      Excl. in GC?     Constitutional: Alert and oriented x4 appears quite uncomfortable and hot to the touch with elevated respiratory rate Eyes: PERRL EOMI. Head: Atraumatic. Nose: No congestion/rhinnorhea. Mouth/Throat: No trismus Neck: No stridor.  Able to lie completely flat with only trace JVD Cardiovascular: Normal rate, regular  rhythm. Grossly normal heart sounds.  Good peripheral circulation. Respiratory: Increased respiratory effort.  Taking very shallow frequent breaths no retractions. Lungs CTAB and moving good air Gastrointestinal: Soft mild diffuse tenderness with no rebound or guarding no peritonitis.  She has right greater than left costovertebral tenderness Musculoskeletal: No lower extremity edema   Neurologic:  Normal speech and language. No gross focal neurologic deficits are appreciated. Skin: Hot to the touch no diaphoresis. Psychiatric: Mood and affect are normal. Speech and behavior are normal.    ____________________________________________   DIFFERENTIAL includes but not limited to  Sepsis, urinary tract infection, pyelonephritis, appendicitis, diverticulitis, bowel obstruction ____________________________________________   LABS (all labs ordered are listed, but only abnormal results are displayed)  Labs Reviewed  COMPREHENSIVE METABOLIC PANEL - Abnormal; Notable for the following components:      Result Value   Sodium 133 (*)    Potassium 3.1 (*)    Glucose, Bld 269 (*)    Creatinine, Ser 1.03 (*)    Albumin 3.3 (*)    AST 14 (*)    Total Bilirubin 1.3 (*)    GFR calc non Af Amer 55 (*)    All other components within normal limits  TROPONIN I - Abnormal; Notable for the following components:   Troponin I 0.06 (*)    All other components within normal limits  CBC WITH DIFFERENTIAL/PLATELET - Abnormal; Notable for the following components:   RBC 3.80 (*)    Hemoglobin 9.9 (*)    HCT 30.7 (*)    Platelets 130 (*)    All other components within normal limits  PROTIME-INR - Abnormal; Notable for the following components:   Prothrombin Time 16.5 (*)    All other components within normal limits  URINALYSIS, ROUTINE W REFLEX MICROSCOPIC - Abnormal; Notable for the following components:   Color, Urine YELLOW (*)    APPearance HAZY (*)    Glucose, UA 50 (*)    Hgb urine dipstick  SMALL (*)    Protein, ur 100 (*)    Leukocytes, UA MODERATE (*)    WBC, UA >50 (*)    All other components within normal limits  BLOOD GAS, VENOUS - Abnormal; Notable for the following components:   pH, Ven 7.47 (*)    pCO2, Ven 35 (*)    pO2, Ven 68.0 (*)  Acid-Base Excess 2.1 (*)    All other components within normal limits  GLUCOSE, CAPILLARY - Abnormal; Notable for the following components:   Glucose-Capillary 222 (*)    All other components within normal limits  GLUCOSE, CAPILLARY - Abnormal; Notable for the following components:   Glucose-Capillary 217 (*)    All other components within normal limits  CULTURE, BLOOD (ROUTINE X 2)  CULTURE, BLOOD (ROUTINE X 2)  URINE CULTURE  LACTIC ACID, PLASMA  LIPASE, BLOOD  PROCALCITONIN  INFLUENZA PANEL BY PCR (TYPE A & B)  TSH  HEMOGLOBIN A1C    Lab work reviewed by me with elevated troponin likely secondary to demand.  Urinalysis concerning for infection.  Her venous blood gas shows slight respiratory alkalosis consistent with anxiety and hyperventilation __________________________________________  EKG  ED ECG REPORT I, Merrily Brittle, the attending physician, personally viewed and interpreted this ECG.  Date: 02/01/2018 EKG Time:  Rate: 73 Rhythm: normal sinus rhythm QRS Axis: Leftward Intervals: normal ST/T Wave abnormalities: Left bundle branch block with anticipated repolarization abnormalities.  Unchanged from previous EKG Narrative Interpretation: no evidence of acute ischemia  ____________________________________________  RADIOLOGY  CT abdomen pelvis reviewed by me shows persistent right-sided hydronephrosis and no other acute disease.  Consistent with urinary tract infection ____________________________________________   PROCEDURES  Procedure(s) performed: no  .Critical Care Performed by: Merrily Brittle, MD Authorized by: Merrily Brittle, MD   Critical care provider statement:    Critical care time  (minutes):  30   Critical care time was exclusive of:  Separately billable procedures and treating other patients   Critical care was necessary to treat or prevent imminent or life-threatening deterioration of the following conditions:  Sepsis   Critical care was time spent personally by me on the following activities:  Development of treatment plan with patient or surrogate, discussions with consultants, evaluation of patient's response to treatment, examination of patient, obtaining history from patient or surrogate, ordering and performing treatments and interventions, ordering and review of laboratory studies, ordering and review of radiographic studies, pulse oximetry, re-evaluation of patient's condition and review of old charts    Critical Care performed: Yes  ____________________________________________   INITIAL IMPRESSION / ASSESSMENT AND PLAN / ED COURSE  Pertinent labs & imaging results that were available during my care of the patient were reviewed by me and considered in my medical decision making (see chart for details).   As part of my medical decision making, I reviewed the following data within the electronic MEDICAL RECORD NUMBER History obtained from family if available, nursing notes, old chart and ekg, as well as notes from prior ED visits.  The patient comes to the emergency department uncomfortable appearing with abdominal pain nausea and vomiting.  Her oral temperature was 100.1 and we checked a rectal temperature which was 102.8 raising concern for sepsis.  The patient has a ureteral stent in place and when we went to perform an in and out catheterization it was largely displaced so we removed it all the way.  Urinalysis today is concerning for infection and on chart review she has had ESBL in the past so instead of ceftriaxone I will treat her with meropenem.  I appreciate that she has known dilated cardiomyopathy however clinically she is quite dry so I gave her 1 L of fluids  with no worsening of her shortness of breath followed by a second liter and again she had no pulmonary edema.  I did send her for CT scan to evaluate for  abdominal sepsis and fortunately no surgical etiology of her symptoms identified.  I gave her Tylenol and ibuprofen to break the fever and for pain with near complete resolution of her symptoms.  I then discussed with the hospitalist Dr. Sheryle Hail who has graciously agreed to admit the patient to his service.  The patient verbalizes understanding and agreement with the plan.  Prior to transfer upstairs her blood pressure had drifted down slightly although she never became hypotensive.      ____________________________________________   FINAL CLINICAL IMPRESSION(S) / ED DIAGNOSES  Final diagnoses:  Sepsis, due to unspecified organism, unspecified whether acute organ dysfunction present [facility])      NEW MEDICATIONS STARTED DURING THIS VISIT:  Current Discharge Medication List       Note:  This document was prepared using Dragon voice recognition software and may include unintentional dictation errors.    Merrily Brittle, MD 02/01/18 [phone]

## 2018-02-01 NOTE — Progress Notes (Signed)
Initial Nutrition Assessment  DOCUMENTATION CODES:   Obesity unspecified  INTERVENTION:   Glucerna Shake po BID, each supplement provides 220 kcal and 10 grams of protein  MVI daily  Liberalize diet   NUTRITION DIAGNOSIS:   Inadequate oral intake related to acute illness as evidenced by meal completion < 25%.  GOAL:   Patient will meet greater than or equal to 90% of their needs  MONITOR:   PO intake, Supplement acceptance, Labs, Weight trends, Skin, I & O's  REASON FOR ASSESSMENT:   Malnutrition Screening Tool    ASSESSMENT:   73 y/o female with past medical history of paroxysmal atrial fibrillation, kidney stones, CHF and diabetes presents to the emergency department with nausea and vomiting.   Met with pt in room today. Pt reports poor appetite and oral intake for 1 day pta r/t nausea and vomiting. Pt reports her appetite is improving today but reports it is still not back to baseline. Pt reports eating <25% of her breakfast this morning. Pt's lunch tray was sitting on her side table untouched; pt reports she is waiting for her son to bring her dentures. Pt did drink a Glucerna today; pt is willing to keep drinking these until her appetite returns. Per chart, pt has lost 21lbs(11%) over the past 8 months; this is not significant given the time frame. Recommend continue supplements and MVI. RD will liberalize the heart healthy restriction from pt's diet as this is restrictive of protein.      Medications reviewed and include: colace, lovenox, lasix, insulin, MVI, NaCl '@100ml' /hr, meropenem   Labs reviewed: Na 133(L), creat 1.03(H) Hgb 9.9(L), Hct 30.7(L) cbgs- 222, 217, 181 x 24 hrs AIC 6.5(H)- 1/23  NUTRITION - FOCUSED PHYSICAL EXAM:    Most Recent Value  Orbital Region  No depletion  Upper Arm Region  No depletion  Thoracic and Lumbar Region  No depletion  Buccal Region  No depletion  Temple Region  No depletion  Clavicle Bone Region  No depletion  Clavicle and  Acromion Bone Region  No depletion  Scapular Bone Region  No depletion  Dorsal Hand  No depletion  Patellar Region  No depletion  Anterior Thigh Region  No depletion  Posterior Calf Region  No depletion  Edema (RD Assessment)  None  Hair  Reviewed  Eyes  Reviewed  Mouth  Reviewed  Skin  Reviewed  Nails  Reviewed     Diet Order:   Diet Order            Diet heart healthy/carb modified Room service appropriate? Yes; Fluid consistency: Thin  Diet effective now             EDUCATION NEEDS:   Education needs have been addressed  Skin:  Skin Assessment: Reviewed RN Assessment(MASD)  Last BM:  1/23  Height:   Ht Readings from Last 1 Encounters:  02/01/18 '5\' 2"'  (1.575 m)    Weight:   Wt Readings from Last 1 Encounters:  02/01/18 80.2 kg    Ideal Body Weight:  50 kg  BMI:  Body mass index is 32.34 kg/m.  Estimated Nutritional Needs:   Kcal:  1500-1800kcal/day   Protein:  80-88g/day   Fluid:  1.5L/day   Koleen Distance MS, RD, LDN Pager #- [phone] Office#- [phone] After Hours Pager: [phone]

## 2018-02-01 NOTE — Progress Notes (Signed)
Inpatient Diabetes Program Recommendations  AACE/ADA: New Consensus Statement on Inpatient Glycemic Control (2015)  Target Ranges:  Prepandial:   less than 140 mg/dL      Peak postprandial:   less than 180 mg/dL (1-2 hours)      Critically ill patients:  140 - 180 mg/dL   Lab Results  Component Value Date   GLUCAP 217 (H) 02/01/2018    Review of Glycemic Control Results for ALEGRIA, STAKES (MRN [id number]) as of 02/01/2018 10:50  Ref. Range 02/01/2018 02:12  Glucose Latest Ref Range: 70 - 99 mg/dL 194 (H)   Diabetes history: DM 2 Outpatient Diabetes medications:  Metformin 500 mg bid, Januvia 100 mg daily,  Current orders for Inpatient glycemic control:  Novolog sensitive tid with meals and HS Tradjenta 5 mg daily  Inpatient Diabetes Program Recommendations:    Please add A1C to current labs.  Also may consider adding Lantus 12 units daily while patient is in the hospital.   Thanks  Beryl Meager, RN, BC-ADM Inpatient Diabetes Coordinator Pager [phone] (8a-5p)

## 2018-02-01 NOTE — ED Triage Notes (Signed)
Pt arrived via EMS from home where pt has had N/V, fever x2 days. Pt on RA 91%. No chronic O2 need. Pt is A&O x4, hot to the touch.

## 2018-02-01 NOTE — Progress Notes (Signed)
Patient ID: Kristina MooreBrenda A Archer, female   DOB: 1945-09-22, 73 y.o.   MRN: [id number]  [facility] PROGRESS NOTE  Kristina MooreBrenda A Archer WGN:[id number] DOB: 1945-09-22 DOA: 02/01/2018 PCP: Corky DownsMasoud, [name], MD  HPI/Subjective: Patient with poor appetite.  Had fever at home.  Vomited.  Feeling worse so she ended up calling EMS.  Had some abdominal pain.  Has some back pain.  Objective: Vitals:   02/01/18 0731 02/01/18 1123  BP: (!) 126/52 (!) 117/55  Pulse:  (!) 58  Resp:  18  Temp:  98.2 F (36.8 C)  SpO2:  98%    Filed Weights   02/01/18 0554  Weight: 80.2 kg    ROS: Review of Systems  Constitutional: Negative for chills and fever.  Eyes: Negative for blurred vision.  Respiratory: Negative for cough and shortness of breath.   Cardiovascular: Negative for chest pain.  Gastrointestinal: Positive for abdominal pain, nausea and vomiting. Negative for constipation and diarrhea.  Genitourinary: Negative for dysuria.  Musculoskeletal: Positive for back pain. Negative for joint pain.  Neurological: Negative for dizziness and headaches.   Exam: Physical Exam  Constitutional: She is oriented to person, place, and time.  HENT:  Nose: No mucosal edema.  Mouth/Throat: No oropharyngeal exudate or posterior oropharyngeal edema.  Eyes: Pupils are equal, round, and reactive to light. Conjunctivae, EOM and lids are normal.  Neck: No JVD present. Carotid bruit is not present. No edema present. No thyroid mass and no thyromegaly present.  Cardiovascular: S1 normal and S2 normal. Exam reveals no gallop.  No murmur heard. Pulses:      Dorsalis pedis pulses are 2+ on the right side and 2+ on the left side.  Respiratory: No respiratory distress. She has no wheezes. She has no rhonchi. She has no rales.  GI: Soft. Bowel sounds are normal. There is no abdominal tenderness.  Musculoskeletal:     Right ankle: She exhibits no swelling.     Left ankle: She exhibits no swelling.  Lymphadenopathy:    She  has no cervical adenopathy.  Neurological: She is alert and oriented to person, place, and time. No cranial nerve deficit.  Skin: Skin is warm. No rash noted. Nails show no clubbing.  Psychiatric: She has a normal mood and affect.      Data Reviewed: Basic Metabolic Panel: Recent Labs  Lab 02/01/18 0212  NA 133*  K 3.1*  CL 101  CO2 24  GLUCOSE 269*  BUN 12  CREATININE 1.03*  CALCIUM 9.3   Liver Function Tests: Recent Labs  Lab 02/01/18 0212  AST 14*  ALT 11  ALKPHOS 62  BILITOT 1.3*  PROT 7.0  ALBUMIN 3.3*   Recent Labs  Lab 02/01/18 0212  LIPASE 39   CBC: Recent Labs  Lab 02/01/18 0212  WBC 7.5  NEUTROABS 5.6  HGB 9.9*  HCT 30.7*  MCV 80.8  PLT 130*   Cardiac Enzymes: Recent Labs  Lab 02/01/18 0212  TROPONINI 0.06*   BNP (last 3 results) Recent Labs    11/03/17 1801  BNP 862.0*     CBG: Recent Labs  Lab 01/29/18 1240 01/29/18 1421 02/01/18 0604 02/01/18 0741 02/01/18 1200  GLUCAP 162* 177* 222* 217* 181*    No results found for this or any previous visit (from the past 240 hour(s)).   Studies: Ct Abdomen Pelvis W Contrast  Result Date: 02/01/2018 CLINICAL DATA:  Nausea, vomiting and fever for 2 days. History of cystoscopy January 29, 2018 for stent placement. History of  hysterectomy and cholecystectomy. EXAM: CT ABDOMEN AND PELVIS WITH CONTRAST TECHNIQUE: Multidetector CT imaging of the abdomen and pelvis was performed using the standard protocol following bolus administration of intravenous contrast. CONTRAST:  OMNIPAQUE IOHEXOL 300 MG/ML  SOLN COMPARISON:  CT abdomen and pelvis November 02, 2017. FINDINGS: LOWER CHEST: Mild cardiomegaly. Interlobular septal thickening seen with pulmonary edema. No pericardial effusion. Bibasilar atelectasis. HEPATOBILIARY: Status post cholecystectomy. Minimal postprocedural intrahepatic biliary dilatation. PANCREAS: Normal. SPLEEN: Spleen is 15.7 cm in craniocaudad dimension, slightly increased.  ADRENALS/URINARY TRACT: Kidneys are orthotopic, mildly delayed RIGHT nephrogram. 6 mm RIGHT lower pole nephrolithiasis. Interval passage of ureteral calculus. RIGHT urothelial enhancement and periureteral fat stranding. Urinary bladder is adequately distended and unremarkable. STOMACH/BOWEL: The stomach, small and large bowel are normal in course and caliber without inflammatory changes sensitivity decreased without oral contrast. Moderate colonic diverticulosis. Normal appendix. VASCULAR/LYMPHATIC: Aortoiliac vessels are normal in course and caliber. Moderate calcific atherosclerosis. No lymphadenopathy by CT size criteria. REPRODUCTIVE: Status post hysterectomy. OTHER: No intraperitoneal free fluid or free air. MUSCULOSKELETAL: Nonacute. Severe L2-3 degenerative disc. Stable grade 1 L4-5 anterolisthesis without spondylolysis. IMPRESSION: 1. Mild RIGHT hydroureteronephrosis with urothelial enhancement most compatible with urinary tract infection. Mildly decreased RIGHT renal function. 2. 6 mm RIGHT lower pole nephrolithiasis. 3. Increasing splenomegaly, recommend correlation with CBC. 4. Mild cardiomegaly and findings of pulmonary edema. Aortic Atherosclerosis (ICD10-I70.0). Electronically Signed   By: Awilda Metro M.D.   On: 02/01/2018 04:15   Dg Chest Port 1 View  Result Date: 02/01/2018 CLINICAL DATA:  Sepsis EXAM: PORTABLE CHEST 1 VIEW COMPARISON:  12/19/2017 FINDINGS: Mild cardiomegaly with aortic atherosclerosis. Central vascular congestion. No consolidation or effusion. No pneumothorax. IMPRESSION: Cardiomegaly with vascular congestion.  No focal airspace disease Electronically Signed   By: Jasmine Pang M.D.   On: 02/01/2018 02:21    Scheduled Meds: . amiodarone  200 mg Oral Daily  . carvedilol  3.125 mg Oral BID WC  . docusate sodium  100 mg Oral BID  . enoxaparin (LOVENOX) injection  40 mg Subcutaneous Q24H  . feeding supplement (GLUCERNA SHAKE)  237 mL Oral BID BM  . furosemide  20 mg  Oral Daily  . insulin aspart  0-5 Units Subcutaneous QHS  . insulin aspart  0-9 Units Subcutaneous TID WC  . linagliptin  5 mg Oral Daily  . lisinopril  5 mg Oral Daily  . multivitamin with minerals  1 tablet Oral Daily  . PARoxetine  20 mg Oral QHS  . rosuvastatin  40 mg Oral Daily  . tamsulosin  0.4 mg Oral Daily  . traZODone  50 mg Oral QHS   Continuous Infusions: . meropenem (MERREM) IV 1 g (02/01/18 1209)    Assessment/Plan:  1. Clinical sepsis likely urine source.  Patient states that she has had multiple infections and had previous PICC line placed.  On aggressive antibiotics with meropenem.  Follow-up urine and blood cultures. 2. Hydronephrosis and small kidney stone.  Will put in for urology consultation since she recently had a stent.  The stent came out as per the patient in the emergency room when they tried to do a catheterization for urine collection. 3. Hypertension on lisinopril 4. Hyperlipidemia on Crestor 5. Type 2 diabetes mellitus on sliding scale and linagliptin.  Hold Glucophage 6. Paroxysmal atrial fibrillation.  Admitting physician held Eliquis.  On amiodarone.   Code Status:     Code Status Orders  (From admission, onward)         Start  Ordered   02/01/18 0551  Full code  Continuous     02/01/18 0550        Code Status History    Date Active Date Inactive Code Status Order ID Comments User Context   12/18/2017 0210 12/22/2017 1710 DNR [id number]  Cammy Copa, MD Inpatient   11/04/2017 1129 11/13/2017 1657 DNR [id number]  Katha Hamming, MD Inpatient   11/02/2017 1524 11/04/2017 1129 Full Code [id number]  Katha Hamming, MD ED     Disposition Plan: TBD  Consultants:  urology  Antibiotics:  meropenum  Time spent: 35 minutes  Miran Kautzman [employer]

## 2018-02-01 NOTE — Progress Notes (Signed)
CODE SEPSIS - PHARMACY COMMUNICATION  **Broad Spectrum Antibiotics should be administered within 1 hour of Sepsis diagnosis**  Time Code Sepsis Called/Page Received: 0123 0205  Antibiotics Ordered: 0123 02007  Time of 1st antibiotic administration: 0123 0221  Additional action taken by pharmacy:   If necessary, Name of Provider/Nurse Contacted:     Erich MontaneMcBane,Paloma Grange S ,PharmD Clinical Pharmacist  02/01/2018  3:00 AM

## 2018-02-01 NOTE — Consult Note (Signed)
02/01/2018 2:02 PM   Kristina Archer 1945-12-15 [id number]  CC: Sepsis from urinary source status post right ureteroscopy  HPI: I was asked to see Kristina Archer in consultation for sepsis from urinary source in the setting of recent right ureteroscopy, laser lithotripsy, and stent placement with Dr. Erlene Archer on 01/29/2018.  She originally underwent urgent right ureteral stent placement on 11/02/2017 with Dr. Gloriann Archer for a 1 cm proximal ureteral stone with UTI.  She then did not follow-up with urology as scheduled and ultimately had multiple hospitalizations with UTI symptoms.  She ultimately underwent definitive management of her right-sided stone burden with Dr. Erlene Archer on 01/29/2018 with right ureteroscopy, laser lithotripsy and stent placement on a Kristina Archer.  The stent was accidentally removed in the emergency department this morning when the patient was straight cathed.  She reports 2 days of weakness, malaise, nausea, and fever to 102.8 prompting presentation to the ER this morning and admission.  Since admission, she has been afebrile and hemodynamically stable.  Based on prior multi drug-resistant E. coli cultures, she was started on meropenem.  CT scan with contrast today showed mild right hydronephrosis with no ureteral stones, and right-sided enhancement consistent with infection.  She feels like she is improving currently, and denies any specific complaints currently.  She specifically denies any right-sided flank or groin pain.  She is voiding yellow urine spontaneously.   PMH: Past Medical History:  Diagnosis Date  . Anxiety   . Cardiomyopathy (Kristina Archer)    a. 10/2017 Echo: EF 20-25%, diff HK, mild MR. Nl RV size.  . DDD (degenerative disc disease), lumbar   . Diverticulosis   . Elevated troponin   . Essential hypertension   . GERD (gastroesophageal reflux disease)   . History of kidney stones   . Hyperlipidemia   . LBBB (left bundle branch block)   . PAF (paroxysmal atrial fibrillation)  ([place])    a.  Diagnosed 10/2017; b.  On Eliquis and amiodarone; c. CHADS2VASc => 5 (CHF, HTN, age x 1, DM, female)  . Pneumonia   . Sciatica   . Type II diabetes mellitus ([place])     Surgical History: Past Surgical History:  Procedure Laterality Date  . ABDOMINAL HYSTERECTOMY    . CHOLECYSTECTOMY    . CYSTOSCOPY W/ RETROGRADES Bilateral 05/24/2017   Procedure: CYSTOSCOPY WITH RETROGRADE PYELOGRAM;  Surgeon: Kristina Espy, MD;  Location: [facility] OR[facility];  Service: Urology;  Laterality: Bilateral;  . CYSTOSCOPY W/ RETROGRADES Right 01/29/2018   Procedure: CYSTOSCOPY WITH RETROGRADE PYELOGRAM;  Surgeon: Kristina Espy, MD;  Location: [facility] OR[facility];  Service: Urology;  Laterality: Right;  . CYSTOSCOPY W/ URETERAL STENT PLACEMENT Right 11/02/2017   Procedure: CYSTOSCOPY WITH RETROGRADE PYELOGRAM/URETERAL STENT PLACEMENT;  Surgeon: Kristina Mallow, MD;  Location: [facility];  Service: Urology;  Laterality: Right;  . CYSTOSCOPY/URETEROSCOPY/HOLMIUM LASER/STENT PLACEMENT Left 05/24/2017   Procedure: CYSTOSCOPY/URETEROSCOPY/HOLMIUM LASER/STENT PLACEMENT;  Surgeon: Kristina Espy, MD;  Location: [facility];  Service: Urology;  Laterality: Left;  . CYSTOSCOPY/URETEROSCOPY/HOLMIUM LASER/STENT PLACEMENT Right 01/29/2018   Procedure: CYSTOSCOPY/URETEROSCOPY/HOLMIUM LASER/STENT Exchange;  Surgeon: Kristina Espy, MD;  Location: [facility];  Service: Urology;  Laterality: Right;     Allergies:  Allergies  Allergen Reactions  . Codeine Nausea Only    Family History: Family History  Problem Relation Age of Onset  . Heart attack Father 82  . Bladder Cancer Neg Hx   . Kidney cancer Neg Hx     Social History:  reports that she quit smoking about 20 years ago.  Her smoking use included cigarettes. She has a 22.50 pack-year smoking history. She has never used smokeless tobacco. She reports previous alcohol use. She reports that she does not use drugs.  ROS: Negative aside from those stated in the HPI  Physical  Exam: BP (!) 117/55 (BP Location: Right Arm)   Pulse (!) 58   Temp 98.2 F (36.8 C) (Oral)   Resp 18   Ht _0  (1.575 m)   Wt 80.2 kg   SpO2 98%   BMI 32.34 kg/m    Constitutional:  Alert and oriented, No acute distress.  Conversational Cardiovascular: No clubbing, cyanosis, or edema. Respiratory: Normal respiratory effort, no increased work of breathing. GI: Abdomen is soft, nontender, nondistended, no abdominal masses GU: No CVA tenderness Lymph: No cervical or inguinal lymphadenopathy. Skin: No rashes, bruises or suspicious lesions. Neurologic: Grossly intact, no focal deficits, moving all 4 extremities. Psychiatric: Normal mood and affect.  Laboratory Data: Reviewed Creatinine 1.03, EGFR 55 Troponin 0.06 Lactic acid 0.9 WBC 7.5  Urinalysis nitrite negative, no bacteria, 6-10 RBCs, greater than 50 whites, WBC clumps present  Pertinent Imaging: I have personally reviewed the CT abdomen pelvis with contrast dated 02/01/2018.  Mild right-sided hydronephrosis with urothelial enhancement consistent with infection, no right-sided ureteral stones.  Mildly delayed right nephrogram.  Assessment & Plan:   In summary, the patient is a very co-morbid 73 year old female with history of multidrug-resistant UTIs, admitted with malaise, fatigue, nausea, and fever suspicious for UTI after undergoing right ureteroscopy, laser lithotripsy, and stent placement on 01/29/2018 with Dr. Erlene Archer.  Her stent was accidentally removed in the emergency department today while undergoing straight catheterization.  She denies any right-sided flank or right groin pain at this time, labs are reassuring, is afebrile since admission, and remains hemodynamically stable.  -No indication for right ureteral stent replacement -Consider Foley placement if elevated PVRs greater than 150 cc -Agree with broad-spectrum antibiotics (meropenem), narrow as able pending sensitivities -Recommend 10 to 14-day course of  antibiotics total -She has scheduled follow-up with Dr. Erlene Archer mid February for renal ultrasound -Call if questions   [place], [place] [address], [place], [place] [address] [phone]

## 2018-02-01 NOTE — H&P (Signed)
Kristina MooreBrenda A Archer is an 73 y.o. female.   Chief Complaint: Vomiting HPI: The patient with past medical history of paroxysmal atrial fibrillation, kidney stones, CHF and diabetes presents to the emergency department with nausea and vomiting.  Emesis started today and has been nonbloody nonbilious.  The patient also states that she was incredibly weak.  She did not know she had a fever but presented to the emergency department febrile.  Notably, the patient had a ureteral stent placed days ago.  Urinalysis consistent with infection.  The patient also meets criteria for sepsis which prompted the emergency department staff to initiate broad-spectrum antibiotics prior to calling the hospitalist service for admission.  Past Medical History:  Diagnosis Date  . Anxiety   . Cardiomyopathy (HCC)    a. 10/2017 Echo: EF 20-25%, diff HK, mild MR. Nl RV size.  . DDD (degenerative disc disease), lumbar   . Diverticulosis   . Elevated troponin   . Essential hypertension   . GERD (gastroesophageal reflux disease)   . History of kidney stones   . Hyperlipidemia   . LBBB (left bundle branch block)   . PAF (paroxysmal atrial fibrillation) (HCC)    a.  Diagnosed 10/2017; b.  On Eliquis and amiodarone; c. CHADS2VASc => 5 (CHF, HTN, age x 1, DM, female)  . Pneumonia   . Sciatica   . Type II diabetes mellitus (HCC)     Past Surgical History:  Procedure Laterality Date  . ABDOMINAL HYSTERECTOMY    . CHOLECYSTECTOMY    . CYSTOSCOPY W/ RETROGRADES Bilateral 05/24/2017   Procedure: CYSTOSCOPY WITH RETROGRADE PYELOGRAM;  Surgeon: [name], [name], MD;  Location: [facility];  Service: Urology;  Laterality: Bilateral;  . CYSTOSCOPY W/ RETROGRADES Right 01/29/2018   Procedure: CYSTOSCOPY WITH RETROGRADE PYELOGRAM;  Surgeon: [name], [name], MD;  Location: [facility];  Service: Urology;  Laterality: Right;  . CYSTOSCOPY W/ URETERAL STENT PLACEMENT Right 11/02/2017   Procedure: CYSTOSCOPY WITH RETROGRADE PYELOGRAM/URETERAL  STENT PLACEMENT;  Surgeon: [name], [name], MD;  Location: [facility];  Service: Urology;  Laterality: Right;  . CYSTOSCOPY/URETEROSCOPY/HOLMIUM LASER/STENT PLACEMENT Left 05/24/2017   Procedure: CYSTOSCOPY/URETEROSCOPY/HOLMIUM LASER/STENT PLACEMENT;  Surgeon: [name], [name], MD;  Location: [facility];  Service: Urology;  Laterality: Left;  . CYSTOSCOPY/URETEROSCOPY/HOLMIUM LASER/STENT PLACEMENT Right 01/29/2018   Procedure: CYSTOSCOPY/URETEROSCOPY/HOLMIUM LASER/STENT Exchange;  Surgeon: [name], [name], MD;  Location: [facility];  Service: Urology;  Laterality: Right;    Family History  Problem Relation Age of Onset  . Heart attack Father [id number]  . Bladder Cancer Neg Hx   . Kidney cancer Neg Hx    Social History:  reports that she quit smoking about 20 years ago. Her smoking use included cigarettes. She has a 22.50 pack-year smoking history. She has never used smokeless tobacco. She reports previous alcohol use. She reports that she does not use drugs.  Allergies:  Allergies  Allergen Reactions  . Codeine Nausea Only    Medications Prior to Admission  Medication Sig Dispense Refill  . acetaminophen (TYLENOL) 325 MG tablet Take 2 tablets (650 mg total) by mouth every 6 (six) hours as needed for mild pain (or Fever >/= 101).    Marland Kitchen. albuterol (PROVENTIL HFA;VENTOLIN HFA) 108 (90 Base) MCG/ACT inhaler Inhale 2 puffs into the lungs every 6 (six) hours as needed for wheezing or shortness of breath. 1 Inhaler 2  . amiodarone (PACERONE) 200 MG tablet Take 1 tablet (200 mg total) by mouth daily. 60 tablet 0  . apixaban (ELIQUIS) 5 MG TABS tablet  Take 1 tablet (5 mg total) by mouth 2 (two) times daily. 60 tablet 0  . carvedilol (COREG) 3.125 MG tablet Take 1 tablet (3.125 mg total) by mouth 2 (two) times daily with a meal. 60 tablet 0  . docusate sodium (COLACE) 100 MG capsule Take 1 capsule (100 mg total) by mouth 2 (two) times daily. 60 capsule 0  . feeding supplement, GLUCERNA SHAKE, (GLUCERNA SHAKE)  LIQD Take 237 mLs by mouth 2 (two) times daily between meals.  0  . furosemide (LASIX) 20 MG tablet Take 20 mg by mouth daily.    Marland Kitchen HYDROcodone-acetaminophen (NORCO/VICODIN) 5-325 MG tablet Take 1-2 tablets by mouth every 6 (six) hours as needed for moderate pain. 10 tablet 0  . lisinopril (PRINIVIL,ZESTRIL) 5 MG tablet Take 1 tablet (5 mg total) by mouth daily. 30 tablet 2  . metFORMIN (GLUCOPHAGE-XR) 500 MG 24 hr tablet Take 500 mg by mouth 2 (two) times daily.    . Multiple Vitamin (MULTIVITAMIN WITH MINERALS) TABS tablet Take 1 tablet by mouth daily.    . nitrofurantoin, macrocrystal-monohydrate, (MACROBID) 100 MG capsule Take 1 capsule (100 mg total) by mouth every 12 (twelve) hours. 20 capsule 0  . PARoxetine (PAXIL) 20 MG tablet Take 20 mg by mouth at bedtime.    . rosuvastatin (CRESTOR) 40 MG tablet Take 1 tablet (40 mg total) by mouth daily. 30 tablet 0  . sitaGLIPtin (JANUVIA) 100 MG tablet Take 100 mg by mouth daily.    . tamsulosin (FLOMAX) 0.4 MG CAPS capsule Take 1 capsule (0.4 mg total) by mouth daily. 30 capsule 0  . traZODone (DESYREL) 50 MG tablet Take 1 tablet by mouth at bedtime.  3  . oxybutynin (DITROPAN) 5 MG tablet Take 1 tablet (5 mg total) by mouth every 8 (eight) hours as needed for bladder spasms. 30 tablet 0    Results for orders placed or performed during the hospital encounter of 02/01/18 (from the past 48 hour(s))  Lactic acid, plasma     Status: None   Collection Time: 02/01/18  2:12 AM  Result Value Ref Range   Lactic Acid, Venous 0.9 0.5 - 1.9 mmol/L    Comment: Performed at [facility], [address] [place], [place] [address]  Comprehensive metabolic panel     Status: Abnormal   Collection Time: 02/01/18  2:12 AM  Result Value Ref Range   Sodium 133 (L) 135 - 145 mmol/L   Potassium 3.1 (L) 3.5 - 5.1 mmol/L   Chloride 101 98 - 111 mmol/L   CO2 24 22 - 32 mmol/L   Glucose, Bld 269 (H) 70 - 99 mg/dL   BUN 12 8 - 23 mg/dL   Creatinine, Ser  5.28 (H) 0.44 - 1.00 mg/dL   Calcium 9.3 8.9 - 41.3 mg/dL   Total Protein 7.0 6.5 - 8.1 g/dL   Albumin 3.3 (L) 3.5 - 5.0 g/dL   AST 14 (L) 15 - 41 U/L   ALT 11 0 - 44 U/L   Alkaline Phosphatase 62 38 - 126 U/L   Total Bilirubin 1.3 (H) 0.3 - 1.2 mg/dL   GFR calc non Af Amer 55 (L) >60 mL/min   GFR calc Af Amer >60 >60 mL/min   Anion gap 8 5 - 15    Comment: Performed at [facility], [address] [place], [place] [address]  Lipase, blood     Status: None   Collection Time: 02/01/18  2:12 AM  Result Value Ref Range  Lipase 39 11 - 51 U/L    Comment: Performed at [facility], [address] [place], [place] [address]  Troponin I - ONCE - STAT     Status: Abnormal   Collection Time: 02/01/18  2:12 AM  Result Value Ref Range   Troponin I 0.06 (HH) <0.03 ng/mL    Comment: CRITICAL RESULT CALLED TO, READ BACK BY AND VERIFIED WITH SAMANTHA HAMILTON @0320  02/01/18 AKT Performed at [facility], [address] [place], [place] [address]   CBC WITH DIFFERENTIAL     Status: Abnormal   Collection Time: 02/01/18  2:12 AM  Result Value Ref Range   WBC 7.5 4.0 - 10.5 K/uL   RBC 3.80 (L) 3.87 - 5.11 MIL/uL   Hemoglobin 9.9 (L) 12.0 - 15.0 g/dL   HCT 09.830.7 (L) 11.936.0 - 14.746.0 %   MCV 80.8 80.0 - 100.0 fL   MCH 26.1 26.0 - 34.0 pg   MCHC 32.2 30.0 - 36.0 g/dL   RDW 82.914.5 56.211.5 - 13.015.5 %   Platelets 130 (L) 150 - 400 K/uL   nRBC 0.0 0.0 - 0.2 %   Neutrophils Relative % 74 %   Neutro Abs 5.6 1.7 - 7.7 K/uL   Lymphocytes Relative 13 %   Lymphs Abs 1.0 0.7 - 4.0 K/uL   Monocytes Relative 13 %   Monocytes Absolute 0.9 0.1 - 1.0 K/uL   Eosinophils Relative 0 %   Eosinophils Absolute 0.0 0.0 - 0.5 K/uL   Basophils Relative 0 %   Basophils Absolute 0.0 0.0 - 0.1 K/uL   Immature Granulocytes 0 %   Abs Immature Granulocytes 0.03 0.00 - 0.07 K/uL    Comment: Performed at [facility], [address] [place], [place] [address]  Procalcitonin     Status: None    Collection Time: 02/01/18  2:12 AM  Result Value Ref Range   Procalcitonin 0.11 ng/mL    Comment:        Interpretation: PCT (Procalcitonin) <= 0.5 ng/mL: Systemic infection (sepsis) is not likely. Local bacterial infection is possible. (NOTE)       Sepsis PCT Algorithm           Lower Respiratory Tract                                      Infection PCT Algorithm    ----------------------------     ----------------------------         PCT < 0.25 ng/mL                PCT < 0.10 ng/mL         Strongly encourage             Strongly discourage   discontinuation of antibiotics    initiation of antibiotics    ----------------------------     -----------------------------       PCT 0.25 - 0.50 ng/mL            PCT 0.10 - 0.25 ng/mL               OR       >80% decrease in PCT            Discourage initiation of  antibiotics      Encourage discontinuation           of antibiotics    ----------------------------     -----------------------------         PCT >= 0.50 ng/mL              PCT 0.26 - 0.50 ng/mL               AND        <80% decrease in PCT             Encourage initiation of                                             antibiotics       Encourage continuation           of antibiotics    ----------------------------     -----------------------------        PCT >= 0.50 ng/mL                  PCT > 0.50 ng/mL               AND         increase in PCT                  Strongly encourage                                      initiation of antibiotics    Strongly encourage escalation           of antibiotics                                     -----------------------------                                           PCT <= 0.25 ng/mL                                                 OR                                        > 80% decrease in PCT                                     Discontinue / Do not initiate                                              antibiotics Performed at [facility], [address], [place], [place] [address]   Protime-INR     Status: Abnormal   Collection Time: 02/01/18  2:12  AM  Result Value Ref Range   Prothrombin Time 16.5 (H) 11.4 - 15.2 seconds   INR 1.35     Comment: Performed at [facility], [address] [place], [place] [address]  Urinalysis, Routine w reflex microscopic     Status: Abnormal   Collection Time: 02/01/18  2:12 AM  Result Value Ref Range   Color, Urine YELLOW (A) YELLOW   APPearance HAZY (A) CLEAR   Specific Gravity, Urine 1.009 1.005 - 1.030   pH 7.0 5.0 - 8.0   Glucose, UA 50 (A) NEGATIVE mg/dL   Hgb urine dipstick SMALL (A) NEGATIVE   Bilirubin Urine NEGATIVE NEGATIVE   Ketones, ur NEGATIVE NEGATIVE mg/dL   Protein, ur 604 (A) NEGATIVE mg/dL   Nitrite NEGATIVE NEGATIVE   Leukocytes, UA MODERATE (A) NEGATIVE   RBC / HPF 6-10 0 - 5 RBC/hpf   WBC, UA >50 (H) 0 - 5 WBC/hpf   Bacteria, UA NONE SEEN NONE SEEN   Squamous Epithelial / LPF 0-5 0 - 5   WBC Clumps PRESENT     Comment: Performed at [facility], [address] [place], [place] [address]  Influenza panel by PCR (type A & B)     Status: None   Collection Time: 02/01/18  2:12 AM  Result Value Ref Range   Influenza A By PCR NEGATIVE NEGATIVE   Influenza B By PCR NEGATIVE NEGATIVE    Comment: (NOTE) The Xpert Xpress Flu assay is intended as an aid in the diagnosis of  influenza and should not be used as a sole basis for treatment.  This  assay is FDA approved for nasopharyngeal swab specimens only. Nasal  washings and aspirates are unacceptable for Xpert Xpress Flu testing. Performed at [facility], [address] [place], [place] [address]   Blood gas, venous     Status: Abnormal   Collection Time: 02/01/18  2:18 AM  Result Value Ref Range   pH, Ven 7.47 (H) 7.250 - 7.430   pCO2, Ven 35 (L) 44.0 - 60.0 mmHg   pO2, Ven 68.0 (H) 32.0 - 45.0 mmHg   Bicarbonate  25.5 20.0 - 28.0 mmol/L   Acid-Base Excess 2.1 (H) 0.0 - 2.0 mmol/L   O2 Saturation 94.5 %   Patient temperature 37.0    Collection site VEIN    Sample type VENOUS     Comment: Performed at [facility], [address] [place], [place] [address]   Ct Abdomen Pelvis W Contrast  Result Date: 02/01/2018 CLINICAL DATA:  Nausea, vomiting and fever for 2 days. History of cystoscopy January 29, 2018 for stent placement. History of hysterectomy and cholecystectomy. EXAM: CT ABDOMEN AND PELVIS WITH CONTRAST TECHNIQUE: Multidetector CT imaging of the abdomen and pelvis was performed using the standard protocol following bolus administration of intravenous contrast. CONTRAST:  OMNIPAQUE IOHEXOL 300 MG/ML  SOLN COMPARISON:  CT abdomen and pelvis November 02, 2017. FINDINGS: LOWER CHEST: Mild cardiomegaly. Interlobular septal thickening seen with pulmonary edema. No pericardial effusion. Bibasilar atelectasis. HEPATOBILIARY: Status post cholecystectomy. Minimal postprocedural intrahepatic biliary dilatation. PANCREAS: Normal. SPLEEN: Spleen is 15.7 cm in craniocaudad dimension, slightly increased. ADRENALS/URINARY TRACT: Kidneys are orthotopic, mildly delayed RIGHT nephrogram. 6 mm RIGHT lower pole nephrolithiasis. Interval passage of ureteral calculus. RIGHT urothelial enhancement and periureteral fat stranding. Urinary bladder is adequately distended and unremarkable. STOMACH/BOWEL: The stomach, small and large bowel are normal in course and caliber without inflammatory changes sensitivity decreased without oral contrast. Moderate colonic  diverticulosis. Normal appendix. VASCULAR/LYMPHATIC: Aortoiliac vessels are normal in course and caliber. Moderate calcific atherosclerosis. No lymphadenopathy by CT size criteria. REPRODUCTIVE: Status post hysterectomy. OTHER: No intraperitoneal free fluid or free air. MUSCULOSKELETAL: Nonacute. Severe L2-3 degenerative disc. Stable grade 1 L4-5 anterolisthesis  without spondylolysis. IMPRESSION: 1. Mild RIGHT hydroureteronephrosis with urothelial enhancement most compatible with urinary tract infection. Mildly decreased RIGHT renal function. 2. 6 mm RIGHT lower pole nephrolithiasis. 3. Increasing splenomegaly, recommend correlation with CBC. 4. Mild cardiomegaly and findings of pulmonary edema. Aortic Atherosclerosis (ICD10-I70.0). Electronically Signed   By: Awilda Metro M.D.   On: 02/01/2018 04:15   Dg Chest Port 1 View  Result Date: 02/01/2018 CLINICAL DATA:  Sepsis EXAM: PORTABLE CHEST 1 VIEW COMPARISON:  12/19/2017 FINDINGS: Mild cardiomegaly with aortic atherosclerosis. Central vascular congestion. No consolidation or effusion. No pneumothorax. IMPRESSION: Cardiomegaly with vascular congestion.  No focal airspace disease Electronically Signed   By: Jasmine Pang M.D.   On: 02/01/2018 02:21    Review of Systems  Constitutional: Positive for fever. Negative for chills.  HENT: Negative for sore throat and tinnitus.   Eyes: Negative for blurred vision and redness.  Respiratory: Negative for cough and shortness of breath.   Cardiovascular: Negative for chest pain, palpitations, orthopnea and PND.  Gastrointestinal: Positive for nausea and vomiting. Negative for abdominal pain and diarrhea.  Genitourinary: Negative for dysuria, frequency and urgency.  Musculoskeletal: Negative for joint pain and myalgias.  Skin: Negative for rash.       No lesions  Neurological: Negative for speech change, focal weakness and weakness.  Endo/Heme/Allergies: Does not bruise/bleed easily.       No temperature intolerance  Psychiatric/Behavioral: Negative for depression and suicidal ideas.    Blood pressure 119/68, pulse 73, temperature 98.5 F (36.9 C), temperature source Oral, resp. rate (!) 26, SpO2 95 %. Physical Exam  Vitals reviewed. Constitutional: She is oriented to person, place, and time. She appears well-developed and well-nourished. No distress.   HENT:  Head: Normocephalic and atraumatic.  Mouth/Throat: Oropharynx is clear and moist.  Eyes: Pupils are equal, round, and reactive to light. Conjunctivae and EOM are normal. No scleral icterus.  Neck: Normal range of motion. Neck supple. No JVD present. No tracheal deviation present. No thyromegaly present.  Cardiovascular: Normal rate, regular rhythm and normal heart sounds. Exam reveals no gallop and no friction rub.  No murmur heard. Respiratory: Effort normal and breath sounds normal.  GI: Soft. Bowel sounds are normal. She exhibits no distension. There is no abdominal tenderness.  Genitourinary:    Genitourinary Comments: Deferred   Musculoskeletal: Normal range of motion.        General: No edema.  Lymphadenopathy:    She has no cervical adenopathy.  Neurological: She is alert and oriented to person, place, and time. No cranial nerve deficit. She exhibits normal muscle tone.  Skin: Skin is warm and dry. No rash noted. No erythema.  Psychiatric: She has a normal mood and affect. Her behavior is normal. Judgment and thought content normal.     Assessment/Plan This is a 73 year old female admitted for sepsis. 1.  Sepsis: The patient meets criteria via fever and tachypnea.  She is hemodynamically stable.  Volume resuscitation in the emergency department followed by meropenem due to history of ESBL urinary tract infection.  Adjust antibiotic coverage based on growth and sensitivities. 2.  UTI: Present on admission.  Plan as above 3.  Hypertension: Controlled; continue lisinopril 4.  CHF: Chronic; systolic.  Last EF 45%.  Continue Lasix and carvedilol per home regimen 5.  Atrial fibrillation: Paroxysmal; rate controlled.  Continue amiodarone.  I have held Eliquis pending urology evaluation. 6.  Diabetes mellitus type 2: Hold metformin. Sliding scale insulin while hospitalized 7.  Hyperlipidemia: Continue statin therapy 8.  Urinary obstruction: Continue tamsulosin 9.  DVT  prophylaxis: Lovenox 10.  GI prophylaxis: None The patient is a full code.  (History of DNR status; clarify advanced actives).  Time spent on admission orders and patient care approximately 45 minutes   Arnaldo Natal, MD 02/01/2018, 5:51 AM

## 2018-02-01 NOTE — Progress Notes (Signed)
   02/01/18 1500  Clinical Encounter Type  Visited With Patient  Visit Type Initial  Stress Factors  Patient Stress Factors Loss of control  Pt experienced frequent hospitalization over the short duration of time. Pt is dealing with adjusting to this new lifestyle. Ch provided emotional support and prayed with the pt. Pt appreciated it.

## 2018-02-02 ENCOUNTER — Ambulatory Visit: Payer: Self-pay

## 2018-02-02 ENCOUNTER — Inpatient Hospital Stay: Payer: Medicare Other

## 2018-02-02 LAB — GLUCOSE, CAPILLARY
Glucose-Capillary: 204 mg/dL — ABNORMAL HIGH (ref 70–99)
Glucose-Capillary: 162 mg/dL — ABNORMAL HIGH (ref 70–99)
Glucose-Capillary: 189 mg/dL — ABNORMAL HIGH (ref 70–99)
Glucose-Capillary: 327 mg/dL — ABNORMAL HIGH (ref 70–99)

## 2018-02-02 LAB — URINE CULTURE: Culture: NO GROWTH

## 2018-02-02 MED ORDER — APIXABAN 5 MG PO TABS
5.0000 mg | ORAL_TABLET | Freq: Two times a day (BID) | ORAL | Status: DC
  Administered 2018-02-02 – 2018-02-04 (×5): 5 mg via ORAL
  Filled 2018-02-02 (×5): qty 1

## 2018-02-02 NOTE — Consult Note (Signed)
ANTICOAGULATION CONSULT NOTE - Initial Consult  Pharmacy Consult for apixaban Indication: atrial fibrillation  Patient Measurements: Height: 5\' 2"  (157.5 cm) Weight: 186 lb 4.8 oz (84.5 kg) IBW/kg (Calculated) : 50.1  Vital Signs: Temp: 99.1 F (37.3 C) (01/24 0546) Temp Source: Oral (01/24 0546) BP: 125/54 (01/24 0356) Pulse Rate: 61 (01/24 0356)  Labs: Recent Labs    02/01/18 0212  HGB 9.9*  HCT 30.7*  PLT 130*  LABPROT 16.5*  INR 1.35  CREATININE 1.03*  TROPONINI 0.06*    Estimated Creatinine Clearance: 51.3 mL/min (A) (by C-G formula based on SCr of 1.03 mg/dL (H)).   Medical History: Past Medical History:  Diagnosis Date  . Anxiety   . Cardiomyopathy (HCC)    a. 10/2017 Echo: EF 20-25%, diff HK, mild MR. Nl RV size.  . DDD (degenerative disc disease), lumbar   . Diverticulosis   . Elevated troponin   . Essential hypertension   . GERD (gastroesophageal reflux disease)   . History of kidney stones   . Hyperlipidemia   . LBBB (left bundle branch block)   . PAF (paroxysmal atrial fibrillation) (HCC)    a.  Diagnosed 10/2017; b.  On Eliquis and amiodarone; c. CHADS2VASc => 5 (CHF, HTN, age x 1, DM, female)  . Pneumonia   . Sciatica   . Type II diabetes mellitus (HCC)     Medications:  Scheduled:  . amiodarone  200 mg Oral Daily  . carvedilol  3.125 mg Oral BID WC  . docusate sodium  100 mg Oral BID  . enoxaparin (LOVENOX) injection  40 mg Subcutaneous Q24H  . feeding supplement (GLUCERNA SHAKE)  237 mL Oral BID BM  . furosemide  20 mg Oral Daily  . insulin aspart  0-5 Units Subcutaneous QHS  . insulin aspart  0-9 Units Subcutaneous TID WC  . linagliptin  5 mg Oral Daily  . lisinopril  5 mg Oral Daily  . multivitamin with minerals  1 tablet Oral Daily  . PARoxetine  20 mg Oral QHS  . rosuvastatin  40 mg Oral Daily  . tamsulosin  0.4 mg Oral Daily  . traZODone  50 mg Oral QHS    Assessment: 73 year-old female admitted for sepsis. She was on  apixaban for afib PTA at a dose of 5 mg twice daily which was held for a urological procedure. That procedure was deferred.  Goal of Therapy:  Monitor platelets by anticoagulation protocol: Yes   Plan:  Restart apixaban at her home dose of 5 mg twice daily which remains appropriate  Lowella Bandy, PharmD 02/02/2018,7:13 AM

## 2018-02-02 NOTE — Evaluation (Signed)
Physical Therapy Evaluation Patient Details Name: Kristina Archer MRN: [id number] DOB: 08-28-1945 Today's Date: 02/02/2018   History of Present Illness  Pt is 73 yo female presented to ED for nausea and vomiting, admitted for sepsis. PMH of HTN, DM II, paroxysmal afib, and recent admits to hospital  Clinical Impression  Patient alert in bed at start of session, behavior WFLs throughout, answered questions appropriately. Reported living in 2nd floor apartment (elevator access) alone, independent in ADLs, assistance from son currently for IADLs, but eager to try to return to PLOF (independent in IADLs, driving, grocery shopping, etc.). Pt did express frustration about needing assistance and relying on family member (son) to assist.  The patient demonstrated bed mobility and transfers with mod I, does reach for external support for initial standing balance deficits. Ambulated ~52ft reaching for furniture, walls, IV pole but able to ambulate without AD. Gait velocity decreased, some shuffling step noted, wide base of support. The patient endorsed recent hospital stays as well as rehab facility stay in December and has been trying to return to her PLOF. Overall the patient demonstrated deficits (see "PT Problem List") that impede the patient's functional abilities, safety, and mobility and would benefit from skilled PT intervention to maximize independence, safety, and mobility.      Follow Up Recommendations Home health PT    Equipment Recommendations  None recommended by PT;Other (comment)(Pt has walker and cane at home)    Recommendations for Other Services       Precautions / Restrictions Precautions Precautions: Fall Restrictions Weight Bearing Restrictions: No      Mobility  Bed Mobility Overal bed mobility: Modified Independent                Transfers Overall transfer level: Needs assistance   Transfers: Sit to/from Stand Sit to Stand: Supervision         General  transfer comment: utilizes bed rails, uses bed rail for initial standing balance.   Ambulation/Gait   Gait Distance (Feet): 35 Feet Assistive device: 1 person hand held assist;None;IV Pole Gait Pattern/deviations: Shuffle;Wide base of support Gait velocity: decreased      Stairs            Wheelchair Mobility    Modified Rankin (Stroke Patients Only)       Balance Overall balance assessment: Needs assistance Sitting-balance support: Feet supported Sitting balance-Leahy Scale: Fair       Standing balance-Leahy Scale: Fair Standing balance comment: reaches for support, most comfortable with at least unilateral UE support                             Pertinent Vitals/Pain Pain Assessment: Faces Faces Pain Scale: Hurts a little bit Pain Location: low back pain  Pain Descriptors / Indicators: Grimacing;Guarding Pain Intervention(s): Repositioned;Monitored during session;Limited activity within patient's tolerance    Home Living Family/patient expects to be discharged to:: Private residence Living Arrangements: Alone Available Help at Discharge: Friend(s);Available PRN/intermittently Type of Home: Apartment Home Access: Elevator     Home Layout: One level Home Equipment: Walker - 2 wheels;Grab bars - toilet      Prior Function Level of Independence: Needs assistance   Gait / Transfers Assistance Needed: Pt ambulates without AD in apartment (does state she reaches for furniture) but uses RW for community ambulation. No falls in the past 6 months.   ADL's / Homemaking Assistance Needed: Pt taking sponge bath ind, ind with dressing.  Is  able to cook simple meals.  [name] do the driving and grocery shopping.          Hand Dominance        Extremity/Trunk Assessment   Upper Extremity Assessment Upper Extremity Assessment: Overall WFL for tasks assessed    Lower Extremity Assessment Lower Extremity Assessment: Generalized weakness        Communication   Communication: No difficulties  Cognition Arousal/Alertness: Awake/alert Behavior During Therapy: WFL for tasks assessed/performed Overall Cognitive Status: Within Functional Limits for tasks assessed                                        General Comments      Exercises     Assessment/Plan    PT Assessment Patient needs continued PT services  PT Problem List Decreased strength;Decreased activity tolerance;Decreased balance;Decreased mobility       PT Treatment Interventions DME instruction;Therapeutic exercise;Gait training;Balance training;Neuromuscular re-education;Therapeutic activities;Patient/family education;Functional mobility training    PT Goals (Current goals can be found in the Care Plan section)  Acute Rehab PT Goals Patient Stated Goal: to get stronger PT Goal Formulation: With patient Time For Goal Achievement: 02/16/18 Potential to Achieve Goals: Good    Frequency Min 2X/week   Barriers to discharge        Co-evaluation               AM-PAC PT "6 Clicks" Mobility  Outcome Measure Help needed turning from your back to your side while in a flat bed without using bedrails?: None Help needed moving from lying on your back to sitting on the side of a flat bed without using bedrails?: None Help needed moving to and from a bed to a chair (including a wheelchair)?: A Little Help needed standing up from a chair using your arms (e.g., wheelchair or bedside chair)?: A Little Help needed to walk in hospital room?: A Little Help needed climbing 3-5 steps with a railing? : A Little 6 Click Score: 20    End of Session Equipment Utilized During Treatment: Gait belt Activity Tolerance: Patient tolerated treatment well Patient left: with chair alarm set;in chair;with call bell/phone within reach Nurse Communication: Mobility status PT Visit Diagnosis: Other abnormalities of gait and mobility (R26.89);Muscle weakness  (generalized) (M62.81)    Time: 3710-6269 PT Time Calculation (min) (ACUTE ONLY): 25 min   Charges:   PT Evaluation $PT Eval Moderate Complexity: 1 Mod PT Treatments $Therapeutic Activity: 8-22 mins        Olga Coaster PT, DPT 2:54 PM,02/02/18 [phone]

## 2018-02-02 NOTE — Progress Notes (Signed)
Inpatient Diabetes Program Recommendations  AACE/ADA: New Consensus Statement on Inpatient Glycemic Control (2015)  Target Ranges:  Prepandial:   less than 140 mg/dL      Peak postprandial:   less than 180 mg/dL (1-2 hours)      Critically ill patients:  140 - 180 mg/dL   Lab Results  Component Value Date   GLUCAP 204 (H) 02/02/2018   HGBA1C 6.5 (H) 02/01/2018    Review of Glycemic ControlResults for PEARSON, MEDARIS (MRN [id number]) as of 02/02/2018 10:36  Ref. Range 02/01/2018 06:04 02/01/2018 07:41 02/01/2018 12:00 02/01/2018 16:43 02/01/2018 20:14 02/02/2018 07:56  Glucose-Capillary Latest Ref Range: 70 - 99 mg/dL 878 (H) 676 (H) 720 (H) 180 (H) 157 (H) 204 (H)   Diabetes history: DM 2 Outpatient Diabetes medications:  Metformin 500 mg bid, Januvia 100 mg daily,  Current orders for Inpatient glycemic control:  Novolog sensitive tid with meals and HS Tradjenta 5 mg daily Inpatient Diabetes Program Recommendations:    A1C indicates excellent control of DM in past 2-3 months.  May consider adding Lantus 12 units daily while in the hospital since metformin is being held.   Thanks  Beryl Meager, RN, BC-ADM Inpatient Diabetes Coordinator Pager [phone] (8a-5p)

## 2018-02-02 NOTE — Care Management Note (Signed)
Case Management Note  Patient Details  Name: Kristina Archer MRN: [id number] Date of Birth: [birth year]  Subjective/Objective:                  Admitted to [facility] with the diagnosis of sepsis. Lives alone. Son is Kristina Archer [phone]). Seen Dr, Juel BurrowMasoud 3 months ago. Prescriptions are filled at [facility].  States that their is a Engineer, civil (consulting)nurse who comes to her home. Also physical therapy has come in the past. Doesn't remember names of agencies. [facility] 12/2017. No home oxygen. Shower chair and rolling walker in the home.  Takes care of all basic activities of daily living herself, drives. Good appetite.  No falls.  Action/Plan: Physical therapy is recommending home with home health and therapy in the home. Declining these services at this time. Requesting personal care services in the home (House work) Discussed that no insurance will pay for this service in the home. List of personal care services given. Discussed CAP program  Expected Discharge Date:                  Expected Discharge Plan:     In-House Referral:   yes  Discharge planning Services   yes  Post Acute Care Choice:    Choice offered to:     DME Arranged:    DME Agency:     HH Arranged:    HH Agency:     Status of Service:     If discussed at [employer] Meetings, dates discussed:    Additional Comments:  Gwenette GreetBrenda S Namir Neto, RN MSN CCM Care Management [phone] 02/02/2018, 3:18 PM

## 2018-02-02 NOTE — Progress Notes (Signed)
Patient ID: Kristina Archer, female   DOB: Jul 26, 1945, 73 y.o.   MRN: [id number]  [employer] PROGRESS NOTE  Kristina Archer JXB:[id number] DOB: Apr 11, 1945 DOA: 02/01/2018 PCP: Kristina Downs, MD  HPI/Subjective: Patient still not feeling great.  Asking for home health to be set up.  Still having some back pain.  Had a temperature of 99 this morning but no high temperatures since a temperature of 102.8 on the 23rd.  Objective: Vitals:   02/02/18 0356 02/02/18 0546  BP: (!) 125/54   Pulse: 61   Resp: 18   Temp: 98 F (36.7 C) 99.1 F (37.3 C)  SpO2: 100%     Filed Weights   02/01/18 0554 02/02/18 0623  Weight: 80.2 kg 84.5 kg    ROS: Review of Systems  Constitutional: Negative for chills and fever.  Eyes: Negative for blurred vision.  Respiratory: Negative for cough and shortness of breath.   Cardiovascular: Negative for chest pain.  Gastrointestinal: Negative for abdominal pain, constipation, diarrhea, nausea and vomiting.  Genitourinary: Negative for dysuria.  Musculoskeletal: Positive for back pain. Negative for joint pain.  Neurological: Negative for dizziness and headaches.   Exam: Physical Exam  Constitutional: She is oriented to person, place, and time.  HENT:  Nose: No mucosal edema.  Mouth/Throat: No oropharyngeal exudate or posterior oropharyngeal edema.  Eyes: Pupils are equal, round, and reactive to light. Conjunctivae, EOM and lids are normal.  Neck: No JVD present. Carotid bruit is not present. No edema present. No thyroid mass and no thyromegaly present.  Cardiovascular: S1 normal and S2 normal. Exam reveals no gallop.  No murmur heard. Pulses:      Dorsalis pedis pulses are 2+ on the right side and 2+ on the left side.  Respiratory: No respiratory distress. She has no wheezes. She has no rhonchi. She has no rales.  GI: Soft. Bowel sounds are normal. There is no abdominal tenderness.  Musculoskeletal:     Right ankle: She exhibits no swelling.      Left ankle: She exhibits no swelling.  Lymphadenopathy:    She has no cervical adenopathy.  Neurological: She is alert and oriented to person, place, and time. No cranial nerve deficit.  Skin: Skin is warm. No rash noted. Nails show no clubbing.  Psychiatric: She has a normal mood and affect.      Data Reviewed: Basic Metabolic Panel: Recent Labs  Lab 02/01/18 0212  NA 133*  K 3.1*  CL 101  CO2 24  GLUCOSE 269*  BUN 12  CREATININE 1.03*  CALCIUM 9.3   Liver Function Tests: Recent Labs  Lab 02/01/18 0212  AST 14*  ALT 11  ALKPHOS 62  BILITOT 1.3*  PROT 7.0  ALBUMIN 3.3*   Recent Labs  Lab 02/01/18 0212  LIPASE 39   CBC: Recent Labs  Lab 02/01/18 0212  WBC 7.5  NEUTROABS 5.6  HGB 9.9*  HCT 30.7*  MCV 80.8  PLT 130*   Cardiac Enzymes: Recent Labs  Lab 02/01/18 0212  TROPONINI 0.06*   BNP (last 3 results) Recent Labs    11/03/17 1801  BNP 862.0*     CBG: Recent Labs  Lab 02/01/18 1200 02/01/18 1643 02/01/18 2014 02/02/18 0756 02/02/18 1158  GLUCAP 181* 180* 157* 204* 162*    Recent Results (from the past 240 hour(s))  Blood Culture (routine x 2)     Status: None (Preliminary result)   Collection Time: 02/01/18  2:12 AM  Result Value Ref Range Status  Specimen Description BLOOD LEFT AC  Final   Special Requests   Final    BOTTLES DRAWN AEROBIC AND ANAEROBIC Blood Culture adequate volume   Culture   Final    NO GROWTH 1 DAY Performed at [facility], [address] [place], [place] [address]    Report Status PENDING  Incomplete  Blood Culture (routine x 2)     Status: None (Preliminary result)   Collection Time: 02/01/18  2:12 AM  Result Value Ref Range Status   Specimen Description BLOOD RIGHT WRIST  Final   Special Requests   Final    BOTTLES DRAWN AEROBIC AND ANAEROBIC Blood Culture results may not be optimal due to an excessive volume of blood received in culture bottles   Culture   Final    NO GROWTH 1  DAY Performed at [facility], [address] [place], [place] [address]    Report Status PENDING  Incomplete  Urine culture     Status: None   Collection Time: 02/01/18  2:12 AM  Result Value Ref Range Status   Specimen Description   Final    URINE, RANDOM Performed at [facility], [address] [place], [place] [address]    Special Requests   Final    NONE Performed at [facility], [address] [place], [place] [address]    Culture   Final    NO GROWTH Performed at [facility], [address] [place], [place] [address]    Report Status 02/02/2018 FINAL  Final     Studies: Dg Thoracic Spine 2 View  Result Date: 02/02/2018 CLINICAL DATA:  Upper back pain EXAM: THORACIC SPINE 2 VIEWS COMPARISON:  12/19/2017 FINDINGS: Osteopenia. No vertebral compression deformity. Mild dextroscoliosis in the midthoracic spine. There are anterior disc osteophytes in the mid and lower thoracic spine. Aortic atherosclerotic vascular calcifications. Vascular calcifications are present in the left upper quadrant of the abdomen. IMPRESSION: No acute bony pathology.  Chronic changes are noted above. Electronically Signed   By: Jolaine ClickArthur  Hoss M.D.   On: 02/02/2018 10:29   Ct Abdomen Pelvis W Contrast  Result Date: 02/01/2018 CLINICAL DATA:  Nausea, vomiting and fever for 2 days. History of cystoscopy January 29, 2018 for stent placement. History of hysterectomy and cholecystectomy. EXAM: CT ABDOMEN AND PELVIS WITH CONTRAST TECHNIQUE: Multidetector CT imaging of the abdomen and pelvis was performed using the standard protocol following bolus administration of intravenous contrast. CONTRAST:  100mL OMNIPAQUE IOHEXOL 300 MG/ML  SOLN COMPARISON:  CT abdomen and pelvis November 02, 2017. FINDINGS: LOWER CHEST: Mild cardiomegaly. Interlobular septal thickening seen with pulmonary edema. No pericardial effusion. Bibasilar atelectasis. HEPATOBILIARY: Status post cholecystectomy.  Minimal postprocedural intrahepatic biliary dilatation. PANCREAS: Normal. SPLEEN: Spleen is 15.7 cm in craniocaudad dimension, slightly increased. ADRENALS/URINARY TRACT: Kidneys are orthotopic, mildly delayed RIGHT nephrogram. 6 mm RIGHT lower pole nephrolithiasis. Interval passage of ureteral calculus. RIGHT urothelial enhancement and periureteral fat stranding. Urinary bladder is adequately distended and unremarkable. STOMACH/BOWEL: The stomach, small and large bowel are normal in course and caliber without inflammatory changes sensitivity decreased without oral contrast. Moderate colonic diverticulosis. Normal appendix. VASCULAR/LYMPHATIC: Aortoiliac vessels are normal in course and caliber. Moderate calcific atherosclerosis. No lymphadenopathy by CT size criteria. REPRODUCTIVE: Status post hysterectomy. OTHER: No intraperitoneal free fluid or free air. MUSCULOSKELETAL: Nonacute. Severe L2-3 degenerative disc. Stable grade 1 L4-5 anterolisthesis without spondylolysis. IMPRESSION: 1. Mild RIGHT hydroureteronephrosis with urothelial enhancement most compatible with urinary tract infection. Mildly decreased  RIGHT renal function. 2. 6 mm RIGHT lower pole nephrolithiasis. 3. Increasing splenomegaly, recommend correlation with CBC. 4. Mild cardiomegaly and findings of pulmonary edema. Aortic Atherosclerosis (ICD10-I70.0). Electronically Signed   By: Awilda Metro M.D.   On: 02/01/2018 04:15   Dg Chest Port 1 View  Result Date: 02/01/2018 CLINICAL DATA:  Sepsis EXAM: PORTABLE CHEST 1 VIEW COMPARISON:  12/19/2017 FINDINGS: Mild cardiomegaly with aortic atherosclerosis. Central vascular congestion. No consolidation or effusion. No pneumothorax. IMPRESSION: Cardiomegaly with vascular congestion.  No focal airspace disease Electronically Signed   By: Jasmine Pang M.D.   On: 02/01/2018 02:21    Scheduled Meds: . amiodarone  200 mg Oral Daily  . apixaban  5 mg Oral BID  . carvedilol  3.125 mg Oral BID WC  .  docusate sodium  100 mg Oral BID  . feeding supplement (GLUCERNA SHAKE)  237 mL Oral BID BM  . furosemide  20 mg Oral Daily  . insulin aspart  0-5 Units Subcutaneous QHS  . insulin aspart  0-9 Units Subcutaneous TID WC  . linagliptin  5 mg Oral Daily  . lisinopril  5 mg Oral Daily  . multivitamin with minerals  1 tablet Oral Daily  . PARoxetine  20 mg Oral QHS  . rosuvastatin  40 mg Oral Daily  . tamsulosin  0.4 mg Oral Daily  . traZODone  50 mg Oral QHS   Continuous Infusions: . meropenem (MERREM) IV 1 g (02/02/18 0145)    Assessment/Plan:  1. Clinical sepsis likely urine source.  Patient states that she has had multiple infections and had previous PICC line placed.  On aggressive antibiotics with meropenem.  So far urine culture and blood cultures are negative.  Urine analysis did look positive and the patient recently had a procedure.  I will give it another day on the urine culture before I decide on what to do with antibiotics. 2. Hydronephrosis and small kidney stone.  Patient seen by urology and no further intervention required.  Follow-up as outpatient. 3. Hypertension on lisinopril 4. Hyperlipidemia on Crestor 5. Type 2 diabetes mellitus on sliding scale and linagliptin.  Hold Glucophage 6. Paroxysmal atrial fibrillation.  On amiodarone and Eliquis restarted. 7. Weakness.  Physical therapy evaluation.   Code Status:     Code Status Orders  (From admission, onward)         Start     Ordered   02/01/18 0551  Full code  Continuous     02/01/18 0550        Code Status History    Date Active Date Inactive Code Status Order ID Comments User Context   12/18/2017 0210 12/22/2017 1710 DNR [id number]  Cammy Copa, MD Inpatient   11/04/2017 1129 11/13/2017 1657 DNR [id number]  Katha Hamming, MD Inpatient   11/02/2017 1524 11/04/2017 1129 Full Code [id number]  Katha Hamming, MD ED     Disposition Plan:  TBD  Consultants:  urology  Antibiotics:  meropenum  Time spent: 25 minutes.  Tried to reach son on the phone.  Jerra Huckeby [employer]

## 2018-02-03 ENCOUNTER — Inpatient Hospital Stay: Payer: Medicare Other

## 2018-02-03 LAB — BASIC METABOLIC PANEL
Anion gap: 4 — ABNORMAL LOW (ref 5–15)
BUN: 10 mg/dL (ref 8–23)
CO2: 28 mmol/L (ref 22–32)
Calcium: 8.6 mg/dL — ABNORMAL LOW (ref 8.9–10.3)
Chloride: 102 mmol/L (ref 98–111)
Creatinine, Ser: 0.85 mg/dL (ref 0.44–1.00)
GFR calc Af Amer: >60 mL/min (ref >60)
GFR calc non Af Amer: >60 mL/min (ref >60)
Glucose, Bld: 196 mg/dL — ABNORMAL HIGH (ref 70–99)
Potassium: 2.8 mmol/L — ABNORMAL LOW (ref 3.5–5.1)
Sodium: 134 mmol/L — ABNORMAL LOW (ref 135–145)

## 2018-02-03 LAB — CBC
HEMATOCRIT: 28.1 % — AB (ref 36.0–46.0)
HEMOGLOBIN: 9 g/dL — AB (ref 12.0–15.0)
MCH: 26.3 pg (ref 26.0–34.0)
MCHC: 32 g/dL (ref 30.0–36.0)
MCV: 82.2 fL (ref 80.0–100.0)
Platelets: 129 10*3/uL — ABNORMAL LOW (ref 150–400)
RBC: 3.42 MIL/uL — ABNORMAL LOW (ref 3.87–5.11)
RDW: 14.6 % (ref 11.5–15.5)
WBC: 5.1 10*3/uL (ref 4.0–10.5)
nRBC: 0 % (ref 0.0–0.2)

## 2018-02-03 LAB — GLUCOSE, CAPILLARY
Glucose-Capillary: 198 mg/dL — ABNORMAL HIGH (ref 70–99)
Glucose-Capillary: 203 mg/dL — ABNORMAL HIGH (ref 70–99)
Glucose-Capillary: 199 mg/dL — ABNORMAL HIGH (ref 70–99)
Glucose-Capillary: 219 mg/dL — ABNORMAL HIGH (ref 70–99)

## 2018-02-03 MED ORDER — PANTOPRAZOLE SODIUM 40 MG PO TBEC: 40.0000 mg | DELAYED_RELEASE_TABLET | Freq: Every day | ORAL | 0 refills | Status: DC

## 2018-02-03 MED ORDER — NITROFURANTOIN MONOHYD MACRO 100 MG PO CAPS: 100.0000 mg | ORAL_CAPSULE | Freq: Every day | ORAL | 0 refills | Status: DC

## 2018-02-03 MED ORDER — PANTOPRAZOLE SODIUM 40 MG PO TBEC
40.0000 mg | DELAYED_RELEASE_TABLET | Freq: Every day | ORAL | Status: DC
  Administered 2018-02-03 – 2018-02-04 (×2): 40 mg via ORAL
  Filled 2018-02-03: qty 1

## 2018-02-03 MED ORDER — POTASSIUM CHLORIDE CRYS ER 20 MEQ PO TBCR
40.0000 meq | EXTENDED_RELEASE_TABLET | Freq: Once | ORAL | Status: AC
  Administered 2018-02-03: 15:00:00 40 meq via ORAL
  Filled 2018-02-03: qty 2

## 2018-02-03 MED ORDER — SODIUM CHLORIDE 0.9 % IV SOLN
INTRAVENOUS | Status: DC | PRN
  Administered 2018-02-03: 01:00:00 1000 mL via INTRAVENOUS

## 2018-02-03 MED ORDER — FUROSEMIDE 10 MG/ML IJ SOLN
20.0000 mg | Freq: Once | INTRAMUSCULAR | Status: AC
  Administered 2018-02-03: 13:00:00 20 mg via INTRAVENOUS
  Filled 2018-02-03: qty 2

## 2018-02-03 MED ORDER — POTASSIUM CHLORIDE CRYS ER 20 MEQ PO TBCR
40.0000 meq | EXTENDED_RELEASE_TABLET | Freq: Once | ORAL | Status: AC
  Administered 2018-02-03: 40 meq via ORAL
  Filled 2018-02-03: qty 2

## 2018-02-03 MED ORDER — POTASSIUM CHLORIDE CRYS ER 20 MEQ PO TBCR
40.0000 meq | EXTENDED_RELEASE_TABLET | Freq: Every day | ORAL | Status: DC
  Administered 2018-02-03: 40 meq via ORAL
  Filled 2018-02-03: qty 2

## 2018-02-03 MED ORDER — FUROSEMIDE 10 MG/ML IJ SOLN
40.0000 mg | Freq: Every day | INTRAMUSCULAR | Status: DC
  Administered 2018-02-04: 40 mg via INTRAVENOUS
  Filled 2018-02-03: qty 4

## 2018-02-03 MED ORDER — POTASSIUM CHLORIDE CRYS ER 20 MEQ PO TBCR: 40.0000 meq | EXTENDED_RELEASE_TABLET | Freq: Every day | ORAL | 0 refills | Status: DC

## 2018-02-03 MED ORDER — NITROFURANTOIN MONOHYD MACRO 100 MG PO CAPS
100.0000 mg | ORAL_CAPSULE | Freq: Every day | ORAL | Status: DC
  Administered 2018-02-03: 100 mg via ORAL
  Filled 2018-02-03 (×2): qty 1

## 2018-02-03 NOTE — Progress Notes (Signed)
Patient ID: Kristina Archer Mckenney, female   DOB: 03/05/1945, 73 y.o.   MRN: [id number]  [facility] PROGRESS NOTE  Kristina Archer Ozimek EGB:[id number] DOB: 03/05/1945 DOA: 02/01/2018 PCP: Corky DownsMasoud, [name], MD  HPI/Subjective: Patient very anxious about things.  Asked Archer lot of questions.  States her breathing is not that good.  Still having back pain.  Had Archer lot of acid reflux.  She is asking for some acid reflux medication.  Objective: Vitals:   02/03/18 1055 02/03/18 1100  BP: (!) 135/56   Pulse: 76   Resp: 19   Temp: 98.3 F (36.8 C)   SpO2: (!) 88% 95%    Filed Weights   02/01/18 0554 02/02/18 0623  Weight: 80.2 kg 84.5 kg    ROS: Review of Systems  Constitutional: Negative for chills and fever.  Eyes: Negative for blurred vision.  Respiratory: Positive for shortness of breath. Negative for cough.   Cardiovascular: Negative for chest pain.  Gastrointestinal: Positive for abdominal pain and nausea. Negative for constipation, diarrhea and vomiting.  Genitourinary: Negative for dysuria.  Musculoskeletal: Positive for back pain. Negative for joint pain.  Neurological: Negative for dizziness and headaches.   Exam: Physical Exam  Constitutional: She is oriented to person, place, and time.  HENT:  Nose: No mucosal edema.  Mouth/Throat: No oropharyngeal exudate or posterior oropharyngeal edema.  Eyes: Pupils are equal, round, and reactive to light. Conjunctivae, EOM and lids are normal.  Neck: No JVD present. Carotid bruit is not present. No edema present. No thyroid mass and no thyromegaly present.  Cardiovascular: S1 normal and S2 normal. Exam reveals no gallop.  No murmur heard. Pulses:      Dorsalis pedis pulses are 2+ on the right side and 2+ on the left side.  Respiratory: No respiratory distress. She has no wheezes. She has no rhonchi. She has no rales.  GI: Soft. Bowel sounds are normal. There is no abdominal tenderness.  Musculoskeletal:     Right ankle: She exhibits no  swelling.     Left ankle: She exhibits no swelling.  Lymphadenopathy:    She has no cervical adenopathy.  Neurological: She is alert and oriented to person, place, and time. No cranial nerve deficit.  Skin: Skin is warm. No rash noted. Nails show no clubbing.  Psychiatric: She has Archer normal mood and affect.      Data Reviewed: Basic Metabolic Panel: Recent Labs  Lab 02/01/18 0212 02/03/18 0418  NA 133* 134*  K 3.1* 2.8*  CL 101 102  CO2 24 28  GLUCOSE 269* 196*  BUN 12 10  CREATININE 1.03* 0.85  CALCIUM 9.3 8.6*   Liver Function Tests: Recent Labs  Lab 02/01/18 0212  AST 14*  ALT 11  ALKPHOS 62  BILITOT 1.3*  PROT 7.0  ALBUMIN 3.3*   Recent Labs  Lab 02/01/18 0212  LIPASE 39   CBC: Recent Labs  Lab 02/01/18 0212 02/03/18 0418  WBC 7.5 5.1  NEUTROABS 5.6  --   HGB 9.9* 9.0*  HCT 30.7* 28.1*  MCV 80.8 82.2  PLT 130* 129*   Cardiac Enzymes: Recent Labs  Lab 02/01/18 0212  TROPONINI 0.06*   BNP (last 3 results) Recent Labs    11/03/17 1801  BNP 862.0*     CBG: Recent Labs  Lab 02/02/18 1158 02/02/18 1714 02/02/18 2131 02/03/18 0731 02/03/18 1154  GLUCAP 162* 189* 327* 198* 203*    Recent Results (from the past 240 hour(s))  Blood Culture (routine x 2)  Status: None (Preliminary result)   Collection Time: 02/01/18  2:12 AM  Result Value Ref Range Status   Specimen Description BLOOD LEFT AC  Final   Special Requests   Final    BOTTLES DRAWN AEROBIC AND ANAEROBIC Blood Culture adequate volume   Culture   Final    NO GROWTH 2 DAYS Performed at [facility], [address], [place], [place] [address]    Report Status PENDING  Incomplete  Blood Culture (routine x 2)     Status: None (Preliminary result)   Collection Time: 02/01/18  2:12 AM  Result Value Ref Range Status   Specimen Description BLOOD RIGHT WRIST  Final   Special Requests   Final    BOTTLES DRAWN AEROBIC AND ANAEROBIC Blood Culture results may not be  optimal due to an excessive volume of blood received in culture bottles   Culture   Final    NO GROWTH 2 DAYS Performed at [facility], [address], [place], [place] [address]    Report Status PENDING  Incomplete  Urine culture     Status: None   Collection Time: 02/01/18  2:12 AM  Result Value Ref Range Status   Specimen Description   Final    URINE, RANDOM Performed at [facility], [address], [place], [place] [address]    Special Requests   Final    NONE Performed at [facility], [address] [place], [place] [address]    Culture   Final    NO GROWTH Performed at [facility], [address] [place], [place] [address]    Report Status 02/02/2018 FINAL  Final     Studies: Dg Chest 2 View  Result Date: 02/03/2018 CLINICAL DATA:  Shortness of breath. Cardiomyopathy. EXAM: CHEST - 2 VIEW COMPARISON:  02/01/2018 FINDINGS: Low lung volumes again seen. Stable mild cardiomegaly. Mild increase in diffuse interstitial infiltrates and Kerley B-lines, consistent with diffuse interstitial edema. Small bilateral pleural effusions. IMPRESSION: Congestive heart failure with with increased interstitial edema and small bilateral pleural effusions. Electronically Signed   By: Myles Rosenthal M.D.   On: 02/03/2018 12:25   Dg Thoracic Spine 2 View  Result Date: 02/02/2018 CLINICAL DATA:  Upper back pain EXAM: THORACIC SPINE 2 VIEWS COMPARISON:  12/19/2017 FINDINGS: Osteopenia. No vertebral compression deformity. Mild dextroscoliosis in the midthoracic spine. There are anterior disc osteophytes in the mid and lower thoracic spine. Aortic atherosclerotic vascular calcifications. Vascular calcifications are present in the left upper quadrant of the abdomen. IMPRESSION: No acute bony pathology.  Chronic changes are noted above. Electronically Signed   By: Jolaine Click M.D.   On: 02/02/2018 10:29    Scheduled Meds: . amiodarone  200 mg Oral Daily  . apixaban   5 mg Oral BID  . carvedilol  3.125 mg Oral BID WC  . docusate sodium  100 mg Oral BID  . feeding supplement (GLUCERNA SHAKE)  237 mL Oral BID BM  . [START ON 02/04/2018] furosemide  40 mg Intravenous Daily  . insulin aspart  0-5 Units Subcutaneous QHS  . insulin aspart  0-9 Units Subcutaneous TID WC  . linagliptin  5 mg Oral Daily  . lisinopril  5 mg Oral Daily  . multivitamin with minerals  1 tablet Oral Daily  . nitrofurantoin (macrocrystal-monohydrate)  100 mg Oral QHS  . pantoprazole  40 mg Oral QAC breakfast  . PARoxetine  20 mg Oral QHS  . potassium chloride  40  mEq Oral QHS  . potassium chloride  40 mEq Oral Once  . rosuvastatin  40 mg Oral Daily  . tamsulosin  0.4 mg Oral Daily  . traZODone  50 mg Oral QHS   Continuous Infusions: . sodium chloride Stopped (02/03/18 0211)    Assessment/Plan:  1. Acute hypoxic respiratory failure.  Pulse ox 88% with ambulation.  We will hold off on discharge today.  Chest x-ray obtained and showed Archer little heart failure.  IV Lasix given.  Check pulse ox tomorrow after ambulation again. 2. Acute on chronic systolic congestive heart failure from fluid overload from IV fluids during the hospital course.  IV Lasix today and tomorrow.  Patient on Coreg and lisinopril. 3. Hypokalemia.  Replace potassium 3 times today.  Recheck potassium and magnesium tomorrow. 4. Clinical sepsis on presentation with fever.  Urine culture resulted as negative.  Blood cultures so far negative.  Stop meropenem.  Put on nitrofurantoin prophylactically. 5. Hydronephrosis and small kidney stone.  Patient seen by urology and no further intervention required.  Follow-up as outpatient. 6. Hypertension on lisinopril, Coreg 7. Hyperlipidemia on Crestor 8. Type 2 diabetes mellitus with hyperglycemia on sliding scale and linagliptin.  Hold Glucophage 9. Paroxysmal atrial fibrillation.  On amiodarone and Eliquis restarted. 10. Weakness.  Physical therapy evaluation.   Code  Status:     Code Status Orders  (From admission, onward)         Start     Ordered   02/01/18 0551  Full code  Continuous     02/01/18 0550        Code Status History    Date Active Date Inactive Code Status Order ID Comments User Context   12/18/2017 0210 12/22/2017 1710 DNR [id number]  Cammy CopaMaier, Angela, MD Inpatient   11/04/2017 1129 11/13/2017 1657 DNR [id number]  Katha HammingKonidena, Snehalatha, MD Inpatient   11/02/2017 1524 11/04/2017 1129 Full Code [id number]  Katha HammingKonidena, Snehalatha, MD ED     Disposition Plan: Reevaluate daily  Consultants:  urology  Antibiotics:  Nitrofurantoin  Time spent: 27 minutes.   Kass Herberger [employer]  [employer] [facility]

## 2018-02-04 LAB — MAGNESIUM: Magnesium: 2.2 mg/dL (ref 1.7–2.4)

## 2018-02-04 LAB — GLUCOSE, CAPILLARY: Glucose-Capillary: 154 mg/dL — ABNORMAL HIGH (ref 70–99)

## 2018-02-04 LAB — BASIC METABOLIC PANEL
Anion gap: 6 (ref 5–15)
BUN: 9 mg/dL (ref 8–23)
CO2: 30 mmol/L (ref 22–32)
Calcium: 9.2 mg/dL (ref 8.9–10.3)
Chloride: 102 mmol/L (ref 98–111)
Creatinine, Ser: 0.81 mg/dL (ref 0.44–1.00)
GFR calc non Af Amer: >60 mL/min (ref >60)
Glucose, Bld: 140 mg/dL — ABNORMAL HIGH (ref 70–99)
Potassium: 3.8 mmol/L (ref 3.5–5.1)
Sodium: 138 mmol/L (ref 135–145)

## 2018-02-04 NOTE — Discharge Summary (Signed)
[facility] - [place] [facility]   PATIENT NAME: Kristina Archer    MR#:  [id number]  DATE OF BIRTH:  08/22/1945  DATE OF ADMISSION:  02/01/2018 ADMITTING PHYSICIAN: Arnaldo NatalMichael S Diamond, MD  DATE OF DISCHARGE: 02/04/2018  PRIMARY CARE PHYSICIAN: Corky DownsMasoud, [name], MD    ADMISSION DIAGNOSIS:  Sepsis, due to unspecified organism, unspecified whether acute organ dysfunction present (HCC) [A41.9]  DISCHARGE DIAGNOSIS:  Active Problems:   Sepsis (HCC)   SECONDARY DIAGNOSIS:   Past Medical History:  Diagnosis Date  . Anxiety   . Cardiomyopathy (HCC)    a. 10/2017 Echo: EF 20-25%, diff HK, mild MR. Nl RV size.  . DDD (degenerative disc disease), lumbar   . Diverticulosis   . Elevated troponin   . Essential hypertension   . GERD (gastroesophageal reflux disease)   . History of kidney stones   . Hyperlipidemia   . LBBB (left bundle branch block)   . PAF (paroxysmal atrial fibrillation) (HCC)    a.  Diagnosed 10/2017; b.  On Eliquis and amiodarone; c. CHADS2VASc => 5 (CHF, HTN, age x 1, DM, female)  . Pneumonia   . Sciatica   . Type II diabetes mellitus (HCC)     HOSPITAL COURSE:   1.  Clinical sepsis on presentation with fever.  Urine culture negative for 3 days.  Blood cultures also negative.  Unclear etiology of the fever.  Meropenem was started.  We did put on nitrofurantoin prophylactically to prevent urinary tract infections. 2.  Acute hypoxic respiratory failure.  This has resolved.  Likely from fluid overload from IV fluids during hospital course. 3.  Acute on chronic systolic congestive heart failure.  Likely this is fluid overload from IV fluids during the hospital course.  IV Lasix given yesterday and today with improvement.  Continue oral Lasix tomorrow Coreg and lisinopril.  We will hold off on spironolactone at this time secondary to previous acute kidney injury and kidney stone issues.  Can consider adding as outpatient. 4.  Hypokalemia.  Improved with  aggressive replacement yesterday. 5.  Hydronephrosis and small kidney stone in the right kidney.  Patient seen by urology and no further intervention required here in this hospitalization.  Follow-up as outpatient. 6.  Hypertension on lisinopril and Coreg 7.  Hyperlipidemia unspecified on Crestor 8.  Paroxysmal atrial fibrillation on amiodarone and Eliquis 9.  Weakness.  Appreciate physical therapy evaluation.  Home health ordered. 10.  GERD started on acid reflux medication  DISCHARGE CONDITIONS:   Satisfactory  CONSULTS OBTAINED:  Treatment Team:  Sondra ComeSninsky, Brian C, MD  DRUG ALLERGIES:   Allergies  Allergen Reactions  . Codeine Nausea Only    DISCHARGE MEDICATIONS:   Allergies as of 02/04/2018      Reactions   Codeine Nausea Only      Medication List    TAKE these medications   acetaminophen 325 MG tablet Commonly known as:  TYLENOL Take 2 tablets (650 mg total) by mouth every 6 (six) hours as needed for mild pain (or Fever >/= 101).   albuterol 108 (90 Base) MCG/ACT inhaler Commonly known as:  PROVENTIL HFA;VENTOLIN HFA Inhale 2 puffs into the lungs every 6 (six) hours as needed for wheezing or shortness of breath.   amiodarone 200 MG tablet Commonly known as:  PACERONE Take 1 tablet (200 mg total) by mouth daily.   apixaban 5 MG Tabs tablet Commonly known as:  ELIQUIS Take 1 tablet (5 mg total) by mouth 2 (two) times daily.  carvedilol 3.125 MG tablet Commonly known as:  COREG Take 1 tablet (3.125 mg total) by mouth 2 (two) times daily with a meal.   docusate sodium 100 MG capsule Commonly known as:  COLACE Take 1 capsule (100 mg total) by mouth 2 (two) times daily.   feeding supplement (GLUCERNA SHAKE) Liqd Take 237 mLs by mouth 2 (two) times daily between meals.   furosemide 20 MG tablet Commonly known as:  LASIX Take 20 mg by mouth daily.   HYDROcodone-acetaminophen 5-325 MG tablet Commonly known as:  NORCO/VICODIN Take 1-2 tablets by mouth  every 6 (six) hours as needed for moderate pain.   lisinopril 5 MG tablet Commonly known as:  PRINIVIL,ZESTRIL Take 1 tablet (5 mg total) by mouth daily.   metFORMIN 500 MG 24 hr tablet Commonly known as:  GLUCOPHAGE-XR Take 500 mg by mouth 2 (two) times daily.   multivitamin with minerals Tabs tablet Take 1 tablet by mouth daily.   nitrofurantoin (macrocrystal-monohydrate) 100 MG capsule Commonly known as:  MACROBID Take 1 capsule (100 mg total) by mouth at bedtime. What changed:  when to take this   oxybutynin 5 MG tablet Commonly known as:  DITROPAN Take 1 tablet (5 mg total) by mouth every 8 (eight) hours as needed for bladder spasms.   pantoprazole 40 MG tablet Commonly known as:  PROTONIX Take 1 tablet (40 mg total) by mouth daily before breakfast.   PARoxetine 20 MG tablet Commonly known as:  PAXIL Take 20 mg by mouth at bedtime.   potassium chloride SA 20 MEQ tablet Commonly known as:  K-DUR,KLOR-CON Take 2 tablets (40 mEq total) by mouth at bedtime.   rosuvastatin 40 MG tablet Commonly known as:  CRESTOR Take 1 tablet (40 mg total) by mouth daily.   sitaGLIPtin 100 MG tablet Commonly known as:  JANUVIA Take 100 mg by mouth daily.   tamsulosin 0.4 MG Caps capsule Commonly known as:  FLOMAX Take 1 capsule (0.4 mg total) by mouth daily.   traZODone 50 MG tablet Commonly known as:  DESYREL Take 1 tablet by mouth at bedtime.        DISCHARGE INSTRUCTIONS:   Follow-up PMD 5 days Follow-up Dr. Apolinar Junes in a few weeks  If you experience worsening of your admission symptoms, develop shortness of breath, life threatening emergency, suicidal or homicidal thoughts you must seek medical attention immediately by calling 911 or calling your MD immediately  if symptoms less severe.  You Must read complete instructions/literature along with all the possible adverse reactions/side effects for all the Medicines you take and that have been prescribed to you. Take any  new Medicines after you have completely understood and accept all the possible adverse reactions/side effects.   Please note  You were cared for by a hospitalist during your hospital stay. If you have any questions about your discharge medications or the care you received while you were in the hospital after you are discharged, you can call the unit and asked to speak with the hospitalist on call if the hospitalist that took care of you is not available. Once you are discharged, your primary care physician will handle any further medical issues. Please note that NO REFILLS for any discharge medications will be authorized once you are discharged, as it is imperative that you return to your primary care physician (or establish a relationship with a primary care physician if you do not have one) for your aftercare needs so that they can reassess your need for  medications and monitor your lab values.    Today   CHIEF COMPLAINT:   Chief Complaint  Patient presents with  . Fever  . Nausea  . Emesis    HISTORY OF PRESENT ILLNESS:  Kristina Archer  is a 73 y.o. female came in with fever nausea and vomiting   VITAL SIGNS:  Blood pressure 122/65, pulse 67, temperature 98.4 F (36.9 C), temperature source Oral, resp. rate 20, height 5\' 2"  (1.575 m), weight 79.9 kg, SpO2 96 %.   PHYSICAL EXAMINATION:  GENERAL:  73 y.o.-year-old patient lying in the bed with no acute distress.  EYES: Pupils equal, round, reactive to light and accommodation. No scleral icterus. Extraocular muscles intact.  HEENT: Head atraumatic, normocephalic. Oropharynx and nasopharynx clear.  NECK:  Supple, no jugular venous distention. No thyroid enlargement, no tenderness.  LUNGS: Normal breath sounds bilaterally, no wheezing, rales,rhonchi or crepitation. No use of accessory muscles of respiration.  CARDIOVASCULAR: S1, S2 normal. No murmurs, rubs, or gallops.  ABDOMEN: Soft, non-tender, non-distended. Bowel sounds present. No  organomegaly or mass.  EXTREMITIES: No pedal edema, cyanosis, or clubbing.  NEUROLOGIC: Cranial nerves II through XII are intact. Muscle strength 5/5 in all extremities. Sensation intact. Gait not checked.  PSYCHIATRIC: The patient is alert and oriented x 3.  SKIN: No obvious rash, lesion, or ulcer.   DATA REVIEW:   CBC Recent Labs  Lab 02/03/18 0418  WBC 5.1  HGB 9.0*  HCT 28.1*  PLT 129*    Chemistries  Recent Labs  Lab 02/01/18 0212  02/04/18 0452  NA 133*   < > 138  K 3.1*   < > 3.8  CL 101   < > 102  CO2 24   < > 30  GLUCOSE 269*   < > 140*  BUN 12   < > 9  CREATININE 1.03*   < > 0.81  CALCIUM 9.3   < > 9.2  MG  --   --  2.2  AST 14*  --   --   ALT 11  --   --   ALKPHOS 62  --   --   BILITOT 1.3*  --   --    < > = values in this interval not displayed.    Cardiac Enzymes Recent Labs  Lab 02/01/18 0212  TROPONINI 0.06*    Microbiology Results  Results for orders placed or performed during the hospital encounter of 02/01/18  Blood Culture (routine x 2)     Status: None (Preliminary result)   Collection Time: 02/01/18  2:12 AM  Result Value Ref Range Status   Specimen Description BLOOD LEFT [facility]  Final   Special Requests   Final    BOTTLES DRAWN AEROBIC AND ANAEROBIC Blood Culture adequate volume   Culture   Final    NO GROWTH 3 DAYS Performed at [facility], [address], [place], [place] [address]    Report Status PENDING  Incomplete  Blood Culture (routine x 2)     Status: None (Preliminary result)   Collection Time: 02/01/18  2:12 AM  Result Value Ref Range Status   Specimen Description BLOOD RIGHT WRIST  Final   Special Requests   Final    BOTTLES DRAWN AEROBIC AND ANAEROBIC Blood Culture results may not be optimal due to an excessive volume of blood received in culture bottles   Culture   Final    NO GROWTH 3 DAYS Performed at [facility], [address] [place], [place] [address]    Report Status PENDING  Incomplete   Urine culture     Status: None   Collection Time: 02/01/18  2:12 AM  Result Value Ref Range Status   Specimen Description   Final    URINE, RANDOM Performed at [facility], [address] [place], [place] [address]    Special Requests   Final    NONE Performed at [facility], [address] [place], [place] [address]    Culture   Final    NO GROWTH Performed at [facility], [address] [place], [place] [address]    Report Status 02/02/2018 FINAL  Final    RADIOLOGY:  Dg Chest 2 View  Result Date: 02/03/2018 CLINICAL DATA:  Shortness of breath. Cardiomyopathy. EXAM: CHEST - 2 VIEW COMPARISON:  02/01/2018 FINDINGS: Low lung volumes again seen. Stable mild cardiomegaly. Mild increase in diffuse interstitial infiltrates and Kerley B-lines, consistent with diffuse interstitial edema. Small bilateral pleural effusions. IMPRESSION: Congestive heart failure with with increased interstitial edema and small bilateral pleural effusions. Electronically Signed   By: Myles RosenthalJohn  Stahl M.D.   On: 02/03/2018 12:25       Management plans discussed with the patient, and she is in agreement.  Patient did not want me to call any family members.  CODE STATUS:     Code Status Orders  (From admission, onward)         Start     Ordered   02/01/18 0551  Full code  Continuous     02/01/18 0550        Code Status History    Date Active Date Inactive Code Status Order ID Comments User Context   12/18/2017 0210 12/22/2017 1710 DNR [id number]  Cammy CopaMaier, Angela, MD Inpatient   11/04/2017 1129 11/13/2017 1657 DNR [id number]  Katha HammingKonidena, Snehalatha, MD Inpatient   11/02/2017 1524 11/04/2017 1129 Full Code [id number]  Katha HammingKonidena, Snehalatha, MD ED      TOTAL TIME TAKING CARE OF THIS PATIENT: 35 minutes.    Alford Highlandichard Antwyne Pingree M.D on 02/04/2018 at 12:16 PM  Between 7am to 6pm - Pager - [phone]  After 6pm go to [email or web address] - password [email or web address]  [facility] Office   [phone]  CC: Primary care physician; Corky DownsMasoud, [name], MD

## 2018-02-05 ENCOUNTER — Other Ambulatory Visit: Payer: Self-pay

## 2018-02-05 DIAGNOSIS — I5023 Acute on chronic systolic (congestive) heart failure: Secondary | ICD-10-CM

## 2018-02-06 ENCOUNTER — Other Ambulatory Visit: Payer: Self-pay | Admitting: *Deleted

## 2018-02-06 ENCOUNTER — Encounter: Payer: Self-pay | Admitting: *Deleted

## 2018-02-06 LAB — CULTURE, BLOOD (ROUTINE X 2)
Culture: NO GROWTH
Culture: NO GROWTH
Special Requests: ADEQUATE

## 2018-02-06 NOTE — Patient Outreach (Signed)
[facility] Customer service manager [facility]) Care Management [facility] Community CM Telephone Outreach PCP office completes Transition of Care follow up post-hospital discharge Post-hospital discharge day # 2 Patient declined participation in [facility] Community CM services   02/06/2018  Kristina Archer 12/26/1945 [id number]  Successful telephone outreach to Kristina Archer, 73 y/o female referred to [facility] Community CM by [facility] Liaison after recent hospital visit January 23-36, 2020 for fever/ sepsis.  Patient was discharged home to self- care with established home health services through [facility].  Patient has history including, but not limited to, CHF with cardiomyopathy; DDD; HTN/ HLD; GERD; pAF with (L) BBB; DM type II; and anxiety.  HIPAA/ identity verified with patient during phone call today, and [facility] Community CM services were discussed with patient.  Today, patient reports that she is currently visiting with home health nurse and she stated that she "told the people at the hospital that she did not need any additional care" at home; patient politely declines [facility] Community CM services.  Plan:  Will make patient inactive with [facility] CM and make patient's PCP aware of same  Caryl Pina, RN, BSN, CCRN [employer] Coordinator [facility] Care Management  [phone]

## 2018-02-09 ENCOUNTER — Telehealth: Payer: Self-pay | Admitting: Urology

## 2018-02-09 NOTE — Telephone Encounter (Signed)
Pt called office very upset asking to speak to Dr. Apolinar Junes, pt would like for someone to explain to her why her kidney stone was not removed when she had surgery.  States she went to Dr. Fonnie Birkenhead whom told her that she still had a kidney stone, the pt states she was admitted for another UTI, pt states her UTI came from the kidney stone that wasn't removed. Please advise pt at [phone].

## 2018-02-09 NOTE — Telephone Encounter (Signed)
Patient was notified that the stone that was causing blockage and her UTI was treated at the time of surgery. Patient was notified that doctor brandon would review CTscan results in more detail at her post op follow up. Patient verbalized understanding

## 2018-02-14 ENCOUNTER — Ambulatory Visit: Payer: Medicare HMO | Admitting: Physician Assistant

## 2018-02-14 ENCOUNTER — Telehealth: Payer: Self-pay

## 2018-02-14 NOTE — Telephone Encounter (Signed)
LM to call office to make appt

## 2018-02-14 NOTE — Telephone Encounter (Signed)
-----   Message from Delma Freezeina A Hackney, [place] sent at 02/05/2018  8:53 AM EST ----- Regarding: hospitalist referral/ discharged 1/26

## 2018-02-23 ENCOUNTER — Ambulatory Visit
Admission: RE | Admit: 2018-02-23 | Discharge: 2018-02-23 | Disposition: A | Discharge status: Home / Self Care | Payer: Medicare Other | Source: Ambulatory Visit | Attending: Urology | Admitting: Urology

## 2018-02-23 DIAGNOSIS — N200 Calculus of kidney: Secondary | ICD-10-CM | POA: Insufficient documentation

## 2018-02-23 DIAGNOSIS — N201 Calculus of ureter: Secondary | ICD-10-CM | POA: Insufficient documentation

## 2018-02-27 ENCOUNTER — Ambulatory Visit: Payer: Medicare Other | Admitting: Urology

## 2018-02-28 ENCOUNTER — Ambulatory Visit: Payer: Medicare Other | Admitting: Urology

## 2018-03-01 ENCOUNTER — Encounter: Payer: Self-pay | Admitting: Emergency Medicine

## 2018-03-01 ENCOUNTER — Inpatient Hospital Stay
Admission: EM | Admit: 2018-03-01 | Discharge: 2018-03-07 | DRG: 659 | Disposition: A | Discharge status: Home Health | Payer: Medicare Other | Attending: Internal Medicine | Admitting: Internal Medicine

## 2018-03-01 ENCOUNTER — Emergency Department: Payer: Medicare Other

## 2018-03-01 ENCOUNTER — Other Ambulatory Visit: Payer: Self-pay

## 2018-03-01 DIAGNOSIS — N289 Disorder of kidney and ureter, unspecified: Secondary | ICD-10-CM

## 2018-03-01 DIAGNOSIS — Z87891 Personal history of nicotine dependence: Secondary | ICD-10-CM

## 2018-03-01 DIAGNOSIS — R059 Cough, unspecified: Secondary | ICD-10-CM

## 2018-03-01 DIAGNOSIS — Z7902 Long term (current) use of antithrombotics/antiplatelets: Secondary | ICD-10-CM

## 2018-03-01 DIAGNOSIS — Z9049 Acquired absence of other specified parts of digestive tract: Secondary | ICD-10-CM

## 2018-03-01 DIAGNOSIS — N200 Calculus of kidney: Secondary | ICD-10-CM

## 2018-03-01 DIAGNOSIS — N179 Acute kidney failure, unspecified: Secondary | ICD-10-CM | POA: Diagnosis present

## 2018-03-01 DIAGNOSIS — F419 Anxiety disorder, unspecified: Secondary | ICD-10-CM | POA: Diagnosis present

## 2018-03-01 DIAGNOSIS — Z8249 Family history of ischemic heart disease and other diseases of the circulatory system: Secondary | ICD-10-CM

## 2018-03-01 DIAGNOSIS — N201 Calculus of ureter: Secondary | ICD-10-CM | POA: Diagnosis present

## 2018-03-01 DIAGNOSIS — N136 Pyonephrosis: Secondary | ICD-10-CM | POA: Diagnosis not present

## 2018-03-01 DIAGNOSIS — I255 Ischemic cardiomyopathy: Secondary | ICD-10-CM | POA: Diagnosis present

## 2018-03-01 DIAGNOSIS — I110 Hypertensive heart disease with heart failure: Secondary | ICD-10-CM | POA: Diagnosis present

## 2018-03-01 DIAGNOSIS — Z87442 Personal history of urinary calculi: Secondary | ICD-10-CM

## 2018-03-01 DIAGNOSIS — E785 Hyperlipidemia, unspecified: Secondary | ICD-10-CM | POA: Diagnosis present

## 2018-03-01 DIAGNOSIS — I447 Left bundle-branch block, unspecified: Secondary | ICD-10-CM | POA: Diagnosis present

## 2018-03-01 DIAGNOSIS — D649 Anemia, unspecified: Secondary | ICD-10-CM | POA: Diagnosis present

## 2018-03-01 DIAGNOSIS — I480 Paroxysmal atrial fibrillation: Secondary | ICD-10-CM | POA: Diagnosis not present

## 2018-03-01 DIAGNOSIS — I5023 Acute on chronic systolic (congestive) heart failure: Secondary | ICD-10-CM | POA: Diagnosis present

## 2018-03-01 DIAGNOSIS — Z90710 Acquired absence of both cervix and uterus: Secondary | ICD-10-CM

## 2018-03-01 DIAGNOSIS — Z960 Presence of urogenital implants: Secondary | ICD-10-CM | POA: Diagnosis present

## 2018-03-01 DIAGNOSIS — Z885 Allergy status to narcotic agent status: Secondary | ICD-10-CM

## 2018-03-01 DIAGNOSIS — Z79899 Other long term (current) drug therapy: Secondary | ICD-10-CM

## 2018-03-01 DIAGNOSIS — N132 Hydronephrosis with renal and ureteral calculous obstruction: Secondary | ICD-10-CM | POA: Diagnosis not present

## 2018-03-01 DIAGNOSIS — E119 Type 2 diabetes mellitus without complications: Secondary | ICD-10-CM | POA: Diagnosis present

## 2018-03-01 DIAGNOSIS — E876 Hypokalemia: Secondary | ICD-10-CM | POA: Diagnosis present

## 2018-03-01 DIAGNOSIS — R05 Cough: Secondary | ICD-10-CM

## 2018-03-01 DIAGNOSIS — Z7984 Long term (current) use of oral hypoglycemic drugs: Secondary | ICD-10-CM

## 2018-03-01 DIAGNOSIS — J9601 Acute respiratory failure with hypoxia: Secondary | ICD-10-CM | POA: Diagnosis present

## 2018-03-01 DIAGNOSIS — K219 Gastro-esophageal reflux disease without esophagitis: Secondary | ICD-10-CM | POA: Diagnosis present

## 2018-03-01 DIAGNOSIS — Z7901 Long term (current) use of anticoagulants: Secondary | ICD-10-CM

## 2018-03-01 LAB — BASIC METABOLIC PANEL
Anion gap: 13 (ref 5–15)
BUN: 19 mg/dL (ref 8–23)
CO2: 18 mmol/L — ABNORMAL LOW (ref 22–32)
Calcium: 9.7 mg/dL (ref 8.9–10.3)
Chloride: 104 mmol/L (ref 98–111)
Creatinine, Ser: 1.3 mg/dL — ABNORMAL HIGH (ref 0.44–1.00)
GFR calc Af Amer: 48 mL/min — ABNORMAL LOW (ref >60)
GFR calc non Af Amer: 42 mL/min — ABNORMAL LOW (ref >60)
Glucose, Bld: 165 mg/dL — ABNORMAL HIGH (ref 70–99)
Potassium: 3.5 mmol/L (ref 3.5–5.1)
Sodium: 135 mmol/L (ref 135–145)

## 2018-03-01 LAB — CBC
HCT: 37.7 % (ref 36.0–46.0)
HEMOGLOBIN: 12.3 g/dL (ref 12.0–15.0)
MCH: 25.9 pg — ABNORMAL LOW (ref 26.0–34.0)
MCHC: 32.6 g/dL (ref 30.0–36.0)
MCV: 79.4 fL — ABNORMAL LOW (ref 80.0–100.0)
Platelets: 123 10*3/uL — ABNORMAL LOW (ref 150–400)
RBC: 4.75 MIL/uL (ref 3.87–5.11)
RDW: 15.2 % (ref 11.5–15.5)
WBC: 9.4 10*3/uL (ref 4.0–10.5)
nRBC: 0 % (ref 0.0–0.2)

## 2018-03-01 LAB — URINALYSIS, COMPLETE (UACMP) WITH MICROSCOPIC
Bacteria, UA: NONE SEEN
Bilirubin Urine: NEGATIVE
GLUCOSE, UA: NEGATIVE mg/dL
Ketones, ur: NEGATIVE mg/dL
Nitrite: NEGATIVE
Protein, ur: 100 mg/dL — AB
RBC / HPF: >50 RBC/hpf — ABNORMAL HIGH (ref 0–5)
Specific Gravity, Urine: 1.015 (ref 1.005–1.030)
pH: 6 (ref 5.0–8.0)

## 2018-03-01 MED ORDER — FENTANYL CITRATE (PF) 100 MCG/2ML IJ SOLN
50.0000 ug | INTRAMUSCULAR | Status: AC | PRN
  Administered 2018-03-01 – 2018-03-02 (×2): 50 ug via INTRAVENOUS
  Filled 2018-03-01 (×2): qty 2

## 2018-03-01 MED ORDER — HYDROMORPHONE HCL 1 MG/ML IJ SOLN
1.0000 mg | Freq: Once | INTRAMUSCULAR | Status: AC
  Administered 2018-03-01: 1 mg via INTRAVENOUS
  Filled 2018-03-01: qty 1

## 2018-03-01 MED ORDER — ONDANSETRON HCL 4 MG/2ML IJ SOLN
4.0000 mg | Freq: Once | INTRAMUSCULAR | Status: AC
  Administered 2018-03-01: 4 mg via INTRAVENOUS
  Filled 2018-03-01: qty 2

## 2018-03-01 MED ORDER — SODIUM CHLORIDE 0.9 % IV BOLUS
1000.0000 mL | Freq: Once | INTRAVENOUS | Status: AC
  Administered 2018-03-01: 1000 mL via INTRAVENOUS

## 2018-03-01 NOTE — ED Triage Notes (Signed)
Patient to ER for c/o right sided flank pain. States she recently was seen and had one stone removed, but knew she had second stone present. Patient reports one episode of vomiting today. States pain feels similar to previous stones. Had ultrasound on Friday and saw blood in urine.

## 2018-03-01 NOTE — ED Provider Notes (Signed)
[facility] Emergency Department Provider Note  ____________________________________________  Time seen: Approximately 9:55 PM  I have reviewed the triage vital signs and the nursing notes.   HISTORY  Chief Complaint Flank Pain    HPI Kristina Archer is a 73 y.o. female with a history of renal colic requiring lithotripsy and stent placement in the past presenting with acute onset of right flank pain radiating to the right lower quadrant and nausea and vomiting this afternoon.  The patient denies any fever, hematuria.  She has had decreased urination.  Received IV fentanyl without significant improvement.  Past Medical History:  Diagnosis Date  . Anxiety   . Cardiomyopathy (HCC)    a. 10/2017 Echo: EF 20-25%, diff HK, mild MR. Nl RV size.  . DDD (degenerative disc disease), lumbar   . Diverticulosis   . Elevated troponin   . Essential hypertension   . GERD (gastroesophageal reflux disease)   . History of kidney stones   . Hyperlipidemia   . LBBB (left bundle branch block)   . PAF (paroxysmal atrial fibrillation) (HCC)    a.  Diagnosed 10/2017; b.  On Eliquis and amiodarone; c. CHADS2VASc => 5 (CHF, HTN, age x 1, DM, female)  . Pneumonia   . Sciatica   . Type II diabetes mellitus [facility])     Patient Active Problem List   Diagnosis Date Noted  . Urinary tract infection with hematuria   . Pneumonia of both lower lobes due to infectious organism (HCC)   . Sepsis (HCC) 12/18/2017  . CHF (congestive heart failure) (HCC)   . Ureterolithiasis 11/02/2017    Past Surgical History:  Procedure Laterality Date  . ABDOMINAL HYSTERECTOMY    . CHOLECYSTECTOMY    . CYSTOSCOPY W/ RETROGRADES Bilateral 05/24/2017   Procedure: CYSTOSCOPY WITH RETROGRADE PYELOGRAM;  Surgeon: Vanna Scotland, MD;  Location: [facility] ORS;  Service: Urology;  Laterality: Bilateral;  . CYSTOSCOPY W/ RETROGRADES Right 01/29/2018   Procedure: CYSTOSCOPY WITH RETROGRADE PYELOGRAM;  Surgeon:  Vanna Scotland, MD;  Location: [facility] ORS;  Service: Urology;  Laterality: Right;  . CYSTOSCOPY W/ URETERAL STENT PLACEMENT Right 11/02/2017   Procedure: CYSTOSCOPY WITH RETROGRADE PYELOGRAM/URETERAL STENT PLACEMENT;  Surgeon: Crista Elliot, MD;  Location: [facility] ORS;  Service: Urology;  Laterality: Right;  . CYSTOSCOPY/URETEROSCOPY/HOLMIUM LASER/STENT PLACEMENT Left 05/24/2017   Procedure: CYSTOSCOPY/URETEROSCOPY/HOLMIUM LASER/STENT PLACEMENT;  Surgeon: Vanna Scotland, MD;  Location: [facility] ORS;  Service: Urology;  Laterality: Left;  . CYSTOSCOPY/URETEROSCOPY/HOLMIUM LASER/STENT PLACEMENT Right 01/29/2018   Procedure: CYSTOSCOPY/URETEROSCOPY/HOLMIUM LASER/STENT Exchange;  Surgeon: Vanna Scotland, MD;  Location: [facility] ORS;  Service: Urology;  Laterality: Right;    Current Outpatient Rx  . Order #: [id number] Class: No Print  . Order #: [id number] Class: Normal  . Order #: [id number] Class: No Print  . Order #: [id number] Class: Normal  . Order #: [id number] Class: Normal  . Order #: [id number] Class: Normal  . Order #: [id number] Class: No Print  . Order #: [id number] Class: Historical Med  . Order #: [id number] Class: Normal  . Order #: [id number] Class: Normal  . Order #: [id number] Class: Historical Med  . Order #: [id number] Class: Historical Med  . Order #: [id number] Class: Normal  . Order #: [id number] Class: Normal  . Order #: [id number] Class: Normal  . Order #: [id number] Class: Historical Med  . Order #: [id number] Class: Normal  . Order #: [id number] Class: Normal  . Order #: [id number] Class: Historical Med  . Order #: [id number] Class: Normal  . Order #: [id number] Class: Historical Med    Allergies Codeine  Family History  Problem Relation  Age of Onset  . Heart attack Father 749  . Bladder Cancer Neg Hx   . Kidney cancer Neg Hx     Social History Social History   Tobacco Use  . Smoking status: Former Smoker    Packs/day: 1.50    Years: 15.00    Pack years: 22.50    Types: Cigarettes    Last  attempt to quit: 05/12/1997    Years since quitting: 20.8  . Smokeless tobacco: Never Used  Substance Use Topics  . Alcohol use: Not Currently  . Drug use: Never    Review of Systems Constitutional: No fever/chills.  Lightheadedness or syncope.  No trauma. Eyes: No visual changes. ENT: No sore throat. No congestion or rhinorrhea. Cardiovascular: Denies chest pain. Denies palpitations. Respiratory: Denies shortness of breath.  No cough. Gastrointestinal: The right flank pain radiating to the right lower quadrant.  +nausea, +vomiting.  No diarrhea.  No constipation. Genitourinary: Negative for dysuria.  No hematuria.  Positive decreased urination. Musculoskeletal: Negative for back pain. Skin: Negative for rash. Neurological: Negative for headaches. No focal numbness, tingling or weakness.     ____________________________________________   PHYSICAL EXAM:  VITAL SIGNS: ED Triage Vitals [03/01/18 1913]  Enc Vitals Group     BP (!) 150/56     Pulse Rate 65     Resp 20     Temp 98.1 F (36.7 C)     Temp Source Oral     SpO2 96 %     Weight 170 lb (77.1 kg)     Height 5\' 2"  (1.575 m)     Head Circumference      Peak Flow      Pain Score 8     Pain Loc      Pain Edu?      Excl. in GC?     Constitutional: Alert and oriented.  Answers questions appropriately. Eyes: Conjunctivae are normal.  EOMI. No scleral icterus. Head: Atraumatic. Nose: No congestion/rhinnorhea. Mouth/Throat: Mucous membranes are moist.  Neck: No stridor.  Supple.   Cardiovascular: Normal rate, regular rhythm. No murmurs, rubs or gallops.  Respiratory: Normal respiratory effort.  No accessory muscle use or retractions. Lungs CTAB.  No wheezes, rales or ronchi. Gastrointestinal: Soft, nontender and nondistended.  Patient has mild right CVA tenderness to palpation.  No guarding or rebound.  No peritoneal signs. Musculoskeletal: No LE edema.  Neurologic:  A&Ox3.  Speech is clear.  Face and smile are  symmetric.  EOMI.  Moves all extremities well. Skin:  Skin is warm, dry and intact. No rash noted. Psychiatric: Mood and affect are normal. Speech and behavior are normal.  Normal judgement.  ____________________________________________   LABS (all labs ordered are listed, but only abnormal results are displayed)  Labs Reviewed  BASIC METABOLIC PANEL - Abnormal; Notable for the following components:      Result Value   CO2 18 (*)    Glucose, Bld 165 (*)    Creatinine, Ser 1.30 (*)    GFR calc non Af Amer 42 (*)    GFR calc Af Amer 48 (*)    All other components within normal limits  CBC - Abnormal; Notable for the following components:   MCV 79.4 (*)    MCH 25.9 (*)    Platelets 123 (*)    All other components within normal limits  URINALYSIS, COMPLETE (UACMP) WITH MICROSCOPIC   ____________________________________________  EKG  Not indicated ____________________________________________  RADIOLOGY  Ct Renal Stone Study  Result  Date: 03/01/2018 CLINICAL DATA:  73 y/o F; right flank pain, recurrent stone disease suspected. EXAM: CT ABDOMEN AND PELVIS WITHOUT CONTRAST TECHNIQUE: Multidetector CT imaging of the abdomen and pelvis was performed following the standard protocol without IV contrast. COMPARISON:  02/01/2018 CT abdomen and pelvis. 02/23/2018 abdominal ultrasound. FINDINGS: Lower chest: Stable cardiomegaly. Hepatobiliary: No focal liver abnormality is seen. Status post cholecystectomy. No biliary dilatation. Pancreas: Unremarkable. No pancreatic ductal dilatation or surrounding inflammatory changes. Spleen: Normal in size without focal abnormality. Adrenals/Urinary Tract: Normal adrenal glands. 5 mm stone in lower pole of left kidney. 5 x 5 x 12 mm stone at the right ureteropelvic junction with right-sided pelvicaliectasis and perinephric stranding. The downstream ureter is nondilated. Decompressed bladder. Stomach/Bowel: Stomach is within normal limits. Appendix appears  normal. No evidence of bowel wall thickening, distention, or inflammatory changes. Sigmoid diverticulosis, no findings of acute diverticulitis. Vascular/Lymphatic: Aortic atherosclerosis. No enlarged abdominal or pelvic lymph nodes. Reproductive: Status post hysterectomy. No adnexal masses. Other: No abdominal wall hernia or abnormality. No abdominopelvic ascites. Musculoskeletal: No fracture is seen. Multilevel degenerative changes of the lumbar spine. L5-S1 grade 1 anterolisthesis. IMPRESSION: 1. 5 x 5 x 12 mm obstructing stone at the right ureteropelvic junction with right-sided pelvicaliectasis and perinephric stranding. 2. 5 mm nonobstructing stone in lower pole of left kidney. 3. Sigmoid diverticulosis, no findings of acute diverticulitis. Electronically Signed   By: Mitzi Hansen M.D.   On: 03/01/2018 21:27    ____________________________________________   PROCEDURES  Procedure(s) performed: None  Procedures  Critical Care performed: No ____________________________________________   INITIAL IMPRESSION / ASSESSMENT AND PLAN / ED COURSE  Pertinent labs & imaging results that were available during my care of the patient were reviewed by me and considered in my medical decision making (see chart for details).  73 y.o. female with a history of renal colic presenting with right flank pain radiating to the right lower quadrant, nausea and vomiting.  Overall, the patient is hemodynamically stable and afebrile, by am concerned because her T CT shows a 5 x 5 x 12 mm stone, which has low likelihood of passing on its own.  In addition, she has acute renal insufficiency today.  She has received intravenous fluids and I am dosing her with Dilaudid as fentanyl did not significantly help her pain and I want to avoid Toradol because of her renal insufficiency.  I have spoken with Dr. Lonna Cobb, the urologist on-call, who recommends admission to the hospitalist for pain control with consultation  by urology.  The patient was unable to urinate, and a urine specimen has just been sent to the lab which will be followed up by the hospitalist.  ____________________________________________  FINAL CLINICAL IMPRESSION(S) / ED DIAGNOSES  Final diagnoses:  Right renal stone  Acute renal insufficiency         NEW MEDICATIONS STARTED DURING THIS VISIT:  New Prescriptions   No medications on file      Rockne Menghini, MD 03/01/18 2200

## 2018-03-02 ENCOUNTER — Inpatient Hospital Stay: Payer: Medicare Other | Admitting: Anesthesiology

## 2018-03-02 ENCOUNTER — Encounter: Payer: Self-pay | Admitting: *Deleted

## 2018-03-02 ENCOUNTER — Encounter
Admission: EM | Disposition: A | Discharge status: Home Health | Payer: Self-pay | Source: Home / Self Care | Attending: Internal Medicine

## 2018-03-02 DIAGNOSIS — F419 Anxiety disorder, unspecified: Secondary | ICD-10-CM | POA: Diagnosis present

## 2018-03-02 DIAGNOSIS — I480 Paroxysmal atrial fibrillation: Secondary | ICD-10-CM | POA: Diagnosis not present

## 2018-03-02 DIAGNOSIS — N201 Calculus of ureter: Secondary | ICD-10-CM | POA: Diagnosis not present

## 2018-03-02 DIAGNOSIS — I5022 Chronic systolic (congestive) heart failure: Secondary | ICD-10-CM | POA: Diagnosis not present

## 2018-03-02 DIAGNOSIS — N200 Calculus of kidney: Secondary | ICD-10-CM

## 2018-03-02 DIAGNOSIS — E785 Hyperlipidemia, unspecified: Secondary | ICD-10-CM | POA: Diagnosis present

## 2018-03-02 DIAGNOSIS — Z01810 Encounter for preprocedural cardiovascular examination: Secondary | ICD-10-CM

## 2018-03-02 DIAGNOSIS — K219 Gastro-esophageal reflux disease without esophagitis: Secondary | ICD-10-CM | POA: Diagnosis present

## 2018-03-02 DIAGNOSIS — N132 Hydronephrosis with renal and ureteral calculous obstruction: Secondary | ICD-10-CM

## 2018-03-02 HISTORY — PX: CYSTOSCOPY WITH STENT PLACEMENT: SHX5790

## 2018-03-02 HISTORY — PX: CYSTOSCOPY W/ RETROGRADES: SHX1426

## 2018-03-02 LAB — CBC
HCT: 32.4 % — ABNORMAL LOW (ref 36.0–46.0)
Hemoglobin: 10.3 g/dL — ABNORMAL LOW (ref 12.0–15.0)
MCH: 25.8 pg — ABNORMAL LOW (ref 26.0–34.0)
MCHC: 31.8 g/dL (ref 30.0–36.0)
MCV: 81.2 fL (ref 80.0–100.0)
NRBC: 0 % (ref 0.0–0.2)
Platelets: 87 10*3/uL — ABNORMAL LOW (ref 150–400)
RBC: 3.99 MIL/uL (ref 3.87–5.11)
RDW: 15.5 % (ref 11.5–15.5)
WBC: 9.2 10*3/uL (ref 4.0–10.5)

## 2018-03-02 LAB — BASIC METABOLIC PANEL
ANION GAP: 6 (ref 5–15)
BUN: 17 mg/dL (ref 8–23)
CO2: 22 mmol/L (ref 22–32)
Calcium: 8.8 mg/dL — ABNORMAL LOW (ref 8.9–10.3)
Chloride: 107 mmol/L (ref 98–111)
Creatinine, Ser: 1.35 mg/dL — ABNORMAL HIGH (ref 0.44–1.00)
GFR calc non Af Amer: 40 mL/min — ABNORMAL LOW (ref >60)
GFR, EST AFRICAN AMERICAN: 46 mL/min — AB (ref >60)
Glucose, Bld: 150 mg/dL — ABNORMAL HIGH (ref 70–99)
Potassium: 3.4 mmol/L — ABNORMAL LOW (ref 3.5–5.1)
SODIUM: 135 mmol/L (ref 135–145)

## 2018-03-02 LAB — URINALYSIS, ROUTINE W REFLEX MICROSCOPIC
Bacteria, UA: NONE SEEN
Bilirubin Urine: NEGATIVE
Glucose, UA: NEGATIVE mg/dL
Ketones, ur: NEGATIVE mg/dL
Nitrite: NEGATIVE
Protein, ur: 100 mg/dL — AB
Specific Gravity, Urine: 1.01 (ref 1.005–1.030)
Squamous Epithelial / HPF: NONE SEEN (ref 0–5)
WBC, UA: >50 WBC/hpf — ABNORMAL HIGH (ref 0–5)
pH: 6 (ref 5.0–8.0)

## 2018-03-02 LAB — GLUCOSE, CAPILLARY
Glucose-Capillary: 128 mg/dL — ABNORMAL HIGH (ref 70–99)
Glucose-Capillary: 138 mg/dL — ABNORMAL HIGH (ref 70–99)

## 2018-03-02 SURGERY — CYSTOSCOPY, WITH STENT INSERTION
Anesthesia: General | Laterality: Right

## 2018-03-02 MED ORDER — TRAZODONE HCL 50 MG PO TABS
50.0000 mg | ORAL_TABLET | Freq: Every day | ORAL | Status: DC
  Administered 2018-03-02 – 2018-03-06 (×4): 50 mg via ORAL
  Filled 2018-03-02 (×5): qty 1

## 2018-03-02 MED ORDER — PANTOPRAZOLE SODIUM 40 MG PO TBEC
40.0000 mg | DELAYED_RELEASE_TABLET | Freq: Every day | ORAL | Status: DC
  Administered 2018-03-02 – 2018-03-07 (×6): 40 mg via ORAL
  Filled 2018-03-02 (×6): qty 1

## 2018-03-02 MED ORDER — CEFAZOLIN SODIUM-DEXTROSE 2-4 GM/100ML-% IV SOLN
INTRAVENOUS | Status: AC
  Filled 2018-03-02: qty 100

## 2018-03-02 MED ORDER — PIPERACILLIN-TAZOBACTAM 3.375 G IVPB
3.3750 g | Freq: Three times a day (TID) | INTRAVENOUS | Status: DC
  Administered 2018-03-02 – 2018-03-05 (×10): 3.375 g via INTRAVENOUS
  Filled 2018-03-02 (×10): qty 50

## 2018-03-02 MED ORDER — GENTAMICIN SULFATE 40 MG/ML IJ SOLN
2.5000 mg/kg | Freq: Once | INTRAVENOUS | Status: DC
  Filled 2018-03-02: qty 4.75

## 2018-03-02 MED ORDER — LIDOCAINE HCL (CARDIAC) PF 100 MG/5ML IV SOSY
PREFILLED_SYRINGE | INTRAVENOUS | Status: DC | PRN
  Administered 2018-03-02: 80 mg via INTRAVENOUS

## 2018-03-02 MED ORDER — PROPOFOL 10 MG/ML IV BOLUS
INTRAVENOUS | Status: DC | PRN
  Administered 2018-03-02: 80 mg via INTRAVENOUS

## 2018-03-02 MED ORDER — APIXABAN 5 MG PO TABS
5.0000 mg | ORAL_TABLET | Freq: Two times a day (BID) | ORAL | Status: DC
  Administered 2018-03-03 – 2018-03-07 (×9): 5 mg via ORAL
  Filled 2018-03-02 (×10): qty 1

## 2018-03-02 MED ORDER — SODIUM CHLORIDE 0.9 % IV SOLN
INTRAVENOUS | Status: DC | PRN
  Administered 2018-03-02: 30 mL via INTRAVENOUS

## 2018-03-02 MED ORDER — ONDANSETRON HCL 4 MG/2ML IJ SOLN
4.0000 mg | Freq: Four times a day (QID) | INTRAMUSCULAR | Status: DC | PRN
  Administered 2018-03-02 – 2018-03-05 (×2): 4 mg via INTRAVENOUS
  Filled 2018-03-02 (×3): qty 2

## 2018-03-02 MED ORDER — IOPAMIDOL (ISOVUE-200) INJECTION 41%
INTRAVENOUS | Status: DC | PRN
  Administered 2018-03-02: 6 mL

## 2018-03-02 MED ORDER — ACETAMINOPHEN 650 MG RE SUPP: 650.0000 mg | Freq: Four times a day (QID) | RECTAL | Status: DC | PRN

## 2018-03-02 MED ORDER — SODIUM CHLORIDE 0.9 % IV SOLN
INTRAVENOUS | Status: DC
  Administered 2018-03-02 (×2): via INTRAVENOUS

## 2018-03-02 MED ORDER — PAROXETINE HCL 20 MG PO TABS
20.0000 mg | ORAL_TABLET | Freq: Every day | ORAL | Status: DC
  Administered 2018-03-02 – 2018-03-04 (×3): 20 mg via ORAL
  Filled 2018-03-02 (×5): qty 1

## 2018-03-02 MED ORDER — ONDANSETRON HCL 4 MG PO TABS: 4.0000 mg | ORAL_TABLET | Freq: Four times a day (QID) | ORAL | Status: DC | PRN

## 2018-03-02 MED ORDER — GENTAMICIN SULFATE 40 MG/ML IJ SOLN
INTRAVENOUS | Status: DC | PRN
  Administered 2018-03-02: 190 mg via INTRAVENOUS

## 2018-03-02 MED ORDER — FENTANYL CITRATE (PF) 100 MCG/2ML IJ SOLN: 25.0000 ug | INTRAMUSCULAR | Status: DC | PRN

## 2018-03-02 MED ORDER — OXYCODONE HCL 5 MG PO TABS
5.0000 mg | ORAL_TABLET | ORAL | Status: DC | PRN
  Administered 2018-03-02 – 2018-03-06 (×13): 5 mg via ORAL
  Filled 2018-03-02 (×13): qty 1

## 2018-03-02 MED ORDER — ACETAMINOPHEN 325 MG PO TABS
650.0000 mg | ORAL_TABLET | Freq: Four times a day (QID) | ORAL | Status: DC | PRN
  Administered 2018-03-03 – 2018-03-07 (×4): 650 mg via ORAL
  Filled 2018-03-02 (×6): qty 2

## 2018-03-02 MED ORDER — SUCCINYLCHOLINE CHLORIDE 20 MG/ML IJ SOLN
INTRAMUSCULAR | Status: DC | PRN
  Administered 2018-03-02: 100 mg via INTRAVENOUS

## 2018-03-02 MED ORDER — CEFAZOLIN SODIUM-DEXTROSE 2-4 GM/100ML-% IV SOLN
2.0000 g | INTRAVENOUS | Status: AC
  Administered 2018-03-02: 2 g via INTRAVENOUS
  Filled 2018-03-02: qty 100

## 2018-03-02 MED ORDER — ENOXAPARIN SODIUM 40 MG/0.4ML ~~LOC~~ SOLN: 40.0000 mg | SUBCUTANEOUS | Status: DC

## 2018-03-02 MED ORDER — AMIODARONE HCL 200 MG PO TABS
200.0000 mg | ORAL_TABLET | Freq: Every day | ORAL | Status: DC
  Administered 2018-03-02 – 2018-03-07 (×6): 200 mg via ORAL
  Filled 2018-03-02 (×8): qty 1

## 2018-03-02 MED ORDER — CARVEDILOL 6.25 MG PO TABS
3.1250 mg | ORAL_TABLET | Freq: Two times a day (BID) | ORAL | Status: DC
  Administered 2018-03-02 – 2018-03-07 (×11): 3.125 mg via ORAL
  Filled 2018-03-02 (×11): qty 1

## 2018-03-02 MED ORDER — ROSUVASTATIN CALCIUM 10 MG PO TABS
40.0000 mg | ORAL_TABLET | Freq: Every day | ORAL | Status: DC
  Administered 2018-03-02 – 2018-03-06 (×5): 40 mg via ORAL
  Filled 2018-03-02 (×2): qty 4
  Filled 2018-03-02: qty 2
  Filled 2018-03-02 (×3): qty 4

## 2018-03-02 SURGICAL SUPPLY — 25 items
BAG DRAIN CYSTO-URO LG1000N (MISCELLANEOUS) ×3 IMPLANT
BAG URINE DRAINAGE (UROLOGICAL SUPPLIES) ×2 IMPLANT
BRUSH SCRUB EZ  4% CHG (MISCELLANEOUS)
BRUSH SCRUB EZ 4% CHG (MISCELLANEOUS) IMPLANT
CATH FOL 2WAY LX 16X30 (CATHETERS) ×2 IMPLANT
CATH URETL 5X70 OPEN END (CATHETERS) ×3 IMPLANT
CONRAY 43 FOR UROLOGY 50M (MISCELLANEOUS) ×3 IMPLANT
GLOVE BIOGEL PI IND STRL 7.5 (GLOVE) ×1 IMPLANT
GLOVE BIOGEL PI INDICATOR 7.5 (GLOVE) ×2
GOWN STRL REUS W/ TWL LRG LVL3 (GOWN DISPOSABLE) ×1 IMPLANT
GOWN STRL REUS W/ TWL XL LVL3 (GOWN DISPOSABLE) ×1 IMPLANT
GOWN STRL REUS W/TWL LRG LVL3 (GOWN DISPOSABLE) ×2
GOWN STRL REUS W/TWL XL LVL3 (GOWN DISPOSABLE) ×2
GUIDEWIRE STR DUAL SENSOR (WIRE) ×3 IMPLANT
HOLDER FOLEY CATH W/STRAP (MISCELLANEOUS) ×2 IMPLANT
KIT TURNOVER CYSTO (KITS) ×3 IMPLANT
PACK CYSTO AR (MISCELLANEOUS) ×3 IMPLANT
SET CYSTO W/LG BORE CLAMP LF (SET/KITS/TRAYS/PACK) ×3 IMPLANT
SOL .9 NS 3000ML IRR  AL (IV SOLUTION) ×2
SOL .9 NS 3000ML IRR UROMATIC (IV SOLUTION) ×1 IMPLANT
STENT URET 6FRX24 CONTOUR (STENTS) ×2 IMPLANT
STENT URET 6FRX26 CONTOUR (STENTS) IMPLANT
SURGILUBE 2OZ TUBE FLIPTOP (MISCELLANEOUS) ×3 IMPLANT
SYRINGE IRR TOOMEY STRL 70CC (SYRINGE) IMPLANT
WATER STERILE IRR 1000ML POUR (IV SOLUTION) ×3 IMPLANT

## 2018-03-02 NOTE — Anesthesia Post-op Follow-up Note (Signed)
Anesthesia QCDR form completed.        

## 2018-03-02 NOTE — Consult Note (Signed)
Urology Consult  Chief Complaint: Kidney stone  History of Present Illness: Kristina Archer is a 73 y.o. year old female seen in consultation at request of Dr. Anne Hahn for evaluation renal colic secondary to a right ureteral calculus.  He has a long history of recurrent stone disease managed primarily by Dr. Apolinar Junes.    She had a renal ultrasound performed 02/23/2017 which showed a nonobstructing 15 mm right lower pole calculus.  She presented to the ED last night with a several hour history of right lower quadrant abdominal pain radiating to the right flank.  There were no identifiable precipitating, aggravating or alleviating factors.  Pain intensity was rated severe.  She had nausea vomiting.  She denied fever or chills.  A stone protocol CT of the abdomen pelvis was performed which showed a 12 mm right proximal ureteral calculus with moderate hydronephrosis and proximal hydroureter.  There was perinephric stranding present.  She has not been able to provide a urine specimen.  Her pain was difficult to control in the ED and she was admitted to the hospitalist service for pain management.  She remains in the ED while awaiting an inpatient bed.  Her pain has been controlled with parenteral analgesics.  She is on Eliquis for atrial fibrillation.    Past Medical History:  Diagnosis Date  . Anxiety   . Cardiomyopathy (HCC)    a. 10/2017 Echo: EF 20-25%, diff HK, mild MR. Nl RV size.  . DDD (degenerative disc disease), lumbar   . Diverticulosis   . Elevated troponin   . Essential hypertension   . GERD (gastroesophageal reflux disease)   . History of kidney stones   . Hyperlipidemia   . LBBB (left bundle branch block)   . PAF (paroxysmal atrial fibrillation) (HCC)    a.  Diagnosed 10/2017; b.  On Eliquis and amiodarone; c. CHADS2VASc => 5 (CHF, HTN, age x 1, DM, female)  . Pneumonia   . Sciatica   . Type II diabetes mellitus (HCC)     Past Surgical History:  Procedure  Laterality Date  . ABDOMINAL HYSTERECTOMY    . CHOLECYSTECTOMY    . CYSTOSCOPY W/ RETROGRADES Bilateral 05/24/2017   Procedure: CYSTOSCOPY WITH RETROGRADE PYELOGRAM;  Surgeon: Vanna Scotland, MD;  Location: [facility] ORS;  Service: Urology;  Laterality: Bilateral;  . CYSTOSCOPY W/ RETROGRADES Right 01/29/2018   Procedure: CYSTOSCOPY WITH RETROGRADE PYELOGRAM;  Surgeon: Vanna Scotland, MD;  Location: [facility] OR[facility];  Service: Urology;  Laterality: Right;  . CYSTOSCOPY W/ URETERAL STENT PLACEMENT Right 11/02/2017   Procedure: CYSTOSCOPY WITH RETROGRADE PYELOGRAM/URETERAL STENT PLACEMENT;  Surgeon: Crista Elliot, MD;  Location: [facility] ORS;  Service: Urology;  Laterality: Right;  . CYSTOSCOPY/URETEROSCOPY/HOLMIUM LASER/STENT PLACEMENT Left 05/24/2017   Procedure: CYSTOSCOPY/URETEROSCOPY/HOLMIUM LASER/STENT PLACEMENT;  Surgeon: Vanna Scotland, MD;  Location: [facility] ORS;  Service: Urology;  Laterality: Left;  . CYSTOSCOPY/URETEROSCOPY/HOLMIUM LASER/STENT PLACEMENT Right 01/29/2018   Procedure: CYSTOSCOPY/URETEROSCOPY/HOLMIUM LASER/STENT Exchange;  Surgeon: Vanna Scotland, MD;  Location: [facility] ORS;  Service: Urology;  Laterality: Right;    Home Medications:  Current Meds  Medication Sig  . amiodarone (PACERONE) 200 MG tablet Take 1 tablet (200 mg total) by mouth daily.  Marland Kitchen apixaban (ELIQUIS) 5 MG TABS tablet Take 1 tablet (5 mg total) by mouth 2 (two) times daily.  . carvedilol (COREG) 3.125 MG tablet Take 1 tablet (3.125 mg total) by mouth 2 (two) times daily with a meal.  . docusate sodium (COLACE) 100 MG capsule Take 1 capsule (100  mg total) by mouth 2 (two) times daily.  . furosemide (LASIX) 20 MG tablet Take 20 mg by mouth daily.  Marland Kitchen HYDROcodone-acetaminophen (NORCO/VICODIN) 5-325 MG tablet Take 1-2 tablets by mouth every 6 (six) hours as needed for moderate pain.  Marland Kitchen lisinopril (PRINIVIL,ZESTRIL) 5 MG tablet Take 1 tablet (5 mg total) by mouth daily.  . metFORMIN (GLUCOPHAGE-XR) 500 MG 24 hr tablet Take  500 mg by mouth 2 (two) times daily.  . Multiple Vitamin (MULTIVITAMIN WITH MINERALS) TABS tablet Take 1 tablet by mouth daily.  . pantoprazole (PROTONIX) 40 MG tablet Take 1 tablet (40 mg total) by mouth daily before breakfast.  . PARoxetine (PAXIL) 20 MG tablet Take 20 mg by mouth at bedtime.  . potassium chloride SA (K-DUR,KLOR-CON) 20 MEQ tablet Take 2 tablets (40 mEq total) by mouth at bedtime.  . rosuvastatin (CRESTOR) 40 MG tablet Take 1 tablet (40 mg total) by mouth daily.  . sitaGLIPtin (JANUVIA) 100 MG tablet Take 100 mg by mouth daily.  . traZODone (DESYREL) 50 MG tablet Take 1 tablet by mouth at bedtime.    Allergies:  Allergies  Allergen Reactions  . Codeine Nausea Only    Family History  Problem Relation Age of Onset  . Heart attack Father 71  . Bladder Cancer Neg Hx   . Kidney cancer Neg Hx     Social History:  reports that she quit smoking about 20 years ago. Her smoking use included cigarettes. She has a 22.50 pack-year smoking history. She has never used smokeless tobacco. She reports previous alcohol use. She reports that she does not use drugs.  ROS: A complete review of systems was performed.  All systems are negative except for pertinent findings as noted.  Physical Exam:  Vital signs in last 24 hours: Temp:  [98.1 F (36.7 C)] 98.1 F (36.7 C) (02/20 1913) Pulse Rate:  [59-70] 63 (02/21 0800) Resp:  [12-20] 18 (02/21 0800) BP: (112-150)/(41-123) 128/47 (02/21 0800) SpO2:  [86 %-98 %] 97 % (02/21 0800) Weight:  [77.1 kg] 77.1 kg (02/20 1913) Constitutional:  Alert and oriented, No acute distress HEENT: [place] AT, moist mucus membranes.  Trachea midline, no masses Cardiovascular: Regular rate and rhythm, no clubbing, cyanosis, or edema. Respiratory: Normal respiratory effort, lungs clear bilaterally GI: Abdomen is soft, mild right lower quadrant tenderness, nondistended, no abdominal masses GU: Mild right CVA tenderness Skin: No rashes, bruises or  suspicious lesions Lymph: No cervical or inguinal adenopathy Neurologic: Grossly intact, no focal deficits, moving all 4 extremities Psychiatric: Normal mood and affect   Laboratory Data:  Recent Labs    03/01/18 1924 03/02/18 0646  WBC 9.4 9.2  HGB 12.3 10.3*  HCT 37.7 32.4*   Recent Labs    03/01/18 1924 03/02/18 0646  NA 135 135  K 3.5 3.4*  CL 104 107  CO2 18* 22  GLUCOSE 165* 150*  BUN 19 17  CREATININE 1.30* 1.35*  CALCIUM 9.7 8.8*     Radiologic Imaging: Ct Renal Stone Study  Result Date: 03/01/2018 CLINICAL DATA:  73 y/o F; right flank pain, recurrent stone disease suspected. EXAM: CT ABDOMEN AND PELVIS WITHOUT CONTRAST TECHNIQUE: Multidetector CT imaging of the abdomen and pelvis was performed following the standard protocol without IV contrast. COMPARISON:  02/01/2018 CT abdomen and pelvis. 02/23/2018 abdominal ultrasound. FINDINGS: Lower chest: Stable cardiomegaly. Hepatobiliary: No focal liver abnormality is seen. Status post cholecystectomy. No biliary dilatation. Pancreas: Unremarkable. No pancreatic ductal dilatation or surrounding inflammatory changes. Spleen: Normal in size  without focal abnormality. Adrenals/Urinary Tract: Normal adrenal glands. 5 mm stone in lower pole of left kidney. 5 x 5 x 12 mm stone at the right ureteropelvic junction with right-sided pelvicaliectasis and perinephric stranding. The downstream ureter is nondilated. Decompressed bladder. Stomach/Bowel: Stomach is within normal limits. Appendix appears normal. No evidence of bowel wall thickening, distention, or inflammatory changes. Sigmoid diverticulosis, no findings of acute diverticulitis. Vascular/Lymphatic: Aortic atherosclerosis. No enlarged abdominal or pelvic lymph nodes. Reproductive: Status post hysterectomy. No adnexal masses. Other: No abdominal wall hernia or abnormality. No abdominopelvic ascites. Musculoskeletal: No fracture is seen. Multilevel degenerative changes of the lumbar  spine. L5-S1 grade 1 anterolisthesis. IMPRESSION: 1. 5 x 5 x 12 mm obstructing stone at the right ureteropelvic junction with right-sided pelvicaliectasis and perinephric stranding. 2. 5 mm nonobstructing stone in lower pole of left kidney. 3. Sigmoid diverticulosis, no findings of acute diverticulitis. Electronically Signed   By: Mitzi HansenLance  Furusawa-Stratton M.D.   On: 03/01/2018 21:27   CT images were personally reviewed  Impression/Assessment:  73 year old female with right renal colic secondary to a 12 mm right proximal ureteral calculus.  She is required regular parenteral analgesia for pain control.  She denies fever and has no leukocytosis.  Urinalysis is pending.  Plan:  I recommended cystoscopy with placement of a right ureteral stent to will relieve obstruction and achieve pain control.  She was agreeable to this plan.  It was stressed no attempts will be made to definitively treat her stone and she will need an additional procedure.  The procedure was discussed in detail including potential risks of bleeding, infection/sepsis and ureteral injury.  The small chance of percutaneous nephrostomy was discussed in the event antegrade stent placement is unsuccessful.  She indicated all questions were answered to her satisfaction and desires to proceed.   03/02/2018, 9:52 AM  Irineo AxonScott Gerrell Tabet,  MD

## 2018-03-02 NOTE — ED Notes (Signed)
Pharmacy notified Amiodarone needed.

## 2018-03-02 NOTE — Progress Notes (Addendum)
Same Day progress note Seen in ED prior to procedure, laying in bed not complaining of pain. No other complains aside from hunger/thirst. Vital signs stable. External catheter in place when seen. Exam completed overnight. Recommendations per Urology/Dr. Richardo Hanks:  Received R ureteral stent and foley. Foley overnight or until afebrile 24 hrs. Urine culture sent by Urology, follow-up. IV Zosyn empirically until cultures back. Pharmacy consulted for renal dosing. 10-14 day course culture appropriate antibiotic then 100 mg Macrobid prophylaxis with stent at discharge. Follow-up 2-3 weeks with urology after infection cleared for ureteroscopy/laser lithotripsy.  Diet ordered. Anti-coagulation restarted (home Eliquis)  Stormy Fabian, PA

## 2018-03-02 NOTE — H&P (Addendum)
UROLOGY H&P UPDATE  Agree with prior H&P dated 2/21 by Dr. Lonna Cobb. 73 yo F with long history of stone disease managed with Dr. Apolinar Junes, who presents with right-sided flank pain and a 12 mm right proximal ureteral stone with upstream hydronephrosis and uncontrolled pain.  She is afebrile with no leukocytosis, however urinalysis has greater than 50 WBCs and WBC clumps, with no bacteria. We discussed at length the need for temporary stent placement and staged procedure in setting of suspected infection.  Cardiac: RRR Lungs: CTA bilaterally  Laterality: Right Procedure: Cystoscopy, right ureteral stent placement  Urine: Urinalysis 2/20: >50 WBCs, WBC clumps present, no bacteria  Gentamycin given pre-op in setting of prior resistant E Coli UTIs  Informed consent obtained, we specifically discussed the risks of bleeding, infection, post-operative pain, need for additional procedures, temporary foley placement, and need for definitive management with ureteroscopy in 2 to 3 weeks when infection treated and kidney drained.  Sondra Come, MD 03/02/2018

## 2018-03-02 NOTE — ED Notes (Signed)
Spoke with urology consult. Pt to remain NPO at this time and he will review patients chart.

## 2018-03-02 NOTE — Anesthesia Postprocedure Evaluation (Signed)
Anesthesia Post Note  Patient: SADYE GOLDSTONE  Procedure(s) Performed: CYSTOSCOPY WITH STENT PLACEMENT-RIGHT (Right ) CYSTOSCOPY WITH RETROGRADE PYELOGRAM (Right )  Patient location during evaluation: PACU Anesthesia Type: General Level of consciousness: awake and alert Pain management: pain level controlled Vital Signs Assessment: post-procedure vital signs reviewed and stable Respiratory status: spontaneous breathing, nonlabored ventilation, respiratory function stable and patient connected to nasal cannula oxygen Cardiovascular status: blood pressure returned to baseline and stable Postop Assessment: no apparent nausea or vomiting Anesthetic complications: no     Last Vitals:  Vitals:   03/02/18 1245 03/02/18 1308  BP: (!) 139/54 (!) 131/52  Pulse: 63 63  Resp: 19 18  Temp:    SpO2: 98% 95%    Last Pain:  Vitals:   03/02/18 1308  TempSrc:   PainSc: 0-No pain                 Cleda Mccreedy Piscitello

## 2018-03-02 NOTE — Op Note (Signed)
Date of procedure: 03/02/18  Preoperative diagnosis:  1. Right 28mm proximal ureteral stone, UTI   Postoperative diagnosis:  1. Right 76mm distal ureteral stone, UTI   Procedure: 1. Cystoscopy, right retrograde pyelogram with intraoperative interpretation, right ureteral stent placement  Surgeon: Legrand Rams, MD  Anesthesia: General  Complications: None  Intraoperative findings:  1.  Purulent urine in the bladder, irrigated free 2.  Right proximal ureteral stone had migrated to the distal ureter 3.  Right renal pelvis aspirate sent for urinalysis, Gram stain, and culture 4.  Uncomplicated right ureteral stent placement  EBL: Minimal  Specimens: Right renal pelvis aspirate for urinalysis, Gram stain, and culture  Drains: Right 6 French by 26 cm ureteral stent, 16 French Foley  Indication: Kristina Archer is a 73 y.o. patient with history of nephrolithiasis who presented with severe right-sided flank pain and on CT scan had a 12 mm right proximal ureteral stone with upstream hydronephrosis.  Urinalysis was concerning for infection with greater than 50 WBCs and WBC clumps.  After reviewing the management options for treatment, they elected to proceed with the above surgical procedure(s). We have discussed the potential benefits and risks of the procedure, side effects of the proposed treatment, the likelihood of the patient achieving the goals of the procedure, and any potential problems that might occur during the procedure or recuperation. Informed consent has been obtained.  Description of procedure:  The patient was taken to the operating room and general anesthesia was induced. SCDs were placed for DVT prophylaxis. The patient was placed in the dorsal lithotomy position, prepped and draped in the usual sterile fashion, and preoperative antibiotics(Ancef and gentamicin) were administered. A preoperative time-out was performed.   A 21 French rigid cystoscope with a 30 degree lens  was used into the bladder.  The urine was very purulent and this was irrigated free.  I then turned my attention to the right ureteral orifice and a sensor wire was advanced into the distal ureter.  On fluoroscopy, I could see that the 12 mm stone had migrated down to the distal ureter, and with the aid of a 5 French access catheter, the sensor wire was advanced alongside the stone into the collecting system.  The access catheter was then advanced over the wire and the wire removed.  There was a hydronephrotic drip with purulent urine noted, and this was sent for urinalysis, Gram stain, and culture.  The sensor wire was replaced into the collecting system, and a 6 [place] by 26 cm stent was uneventfully placed.  There was a good curl in the upper pole, as well as under direct vision in the bladder.  Purulent urine was seen to drain through the side-port of the stent.  A 16 French Foley was placed to maximize decompression overnight.  Disposition: Stable to PACU  Plan: Follow-up Gram stain and culture, and treat with 10 to 14 days of culture appropriate antibiotics Can discontinue Foley when afebrile greater than 24 hours We will arrange follow-up for definitive management of right distal ureteral stone in 2-3 weeks  Legrand Rams, MD

## 2018-03-02 NOTE — ED Notes (Signed)
Report received from [place], [place]. Pt care assumed at this time.

## 2018-03-02 NOTE — Consult Note (Signed)
Cardiology Consultation:   Patient ID: Kristina Archer; [id number]; [birth year]   Admit date: 03/01/2018 Date of Consult: 03/02/2018  Primary Care Provider: Corky Downs, MD Primary Cardiologist: Kristina Archer   Patient Profile:   Kristina Archer is a 73 y.o. female with a hx of HFrEF with prior refusal of ischemic evaluation, recently diagnosed PAF in 10/2017 on Eliquis, SVT, LBBB, anemia, recurrent ureteral stone requiring prior cystoscopy and ureteral stenting, DM2, HTN, and HLD who is being seen today for the evaluation of preoperative cardiac risk stratification at the request of Kristina Archer.  History of Present Illness:   Kristina Archer did not have any previously known cardiac history prior to her 10/2017 admission.  She had undergone an echocardiogram at [facility] several years prior though we do not have access to those records.  Patient was admitted to the hospital in 10/2017 with severe right flank pain and was found to have a 10 x 13 mm right UPJ stone on CT of the abdomen/pelvis.  The patient did not report chest pain though troponin was checked and found to be elevated initially at 0.09, ultimately peaking at 0.80.  Chest x-ray showed pulmonary edema.  EKG upon arrival showed SVT, 138 bpm.  She was given adenosine with conversion to sinus rhythm.  Echo on 11/03/2017 showed an EF of 20 to 25%, diffuse hypokinesis, mild mitral regurgitation, left atrium normal in size, RVSF normal, unable to estimate PASP.  Rhythm during echo was WCT with rate of 129 bpm.  She was IV diuresed.  She was managed with IV heparin for 48 hours with recommendation for right and left cardiac cath.  However, the patient preferred to defer cardiac cath during her admission and wanted to discuss this further with her primary care physician.  During her admission, she developed new onset A. fib with RVR with a baseline left bundle.  In this setting, she was started on amiodarone infusion and heparin drip was continued.  She was  ultimately transitioned to p.o. amiodarone and Eliquis.  She did not follow-up with cardiology as an outpatient.  She was subsequently readmitted to the hospital from 12/8 through 12/13 for sepsis in the setting of UTI and pneumonia.  Apparently, the patient was found laying down on her couch in a pool of liquid stool.  She continued to note right flank pain.  She was treated for both pneumonia and UTI with broad-spectrum IV antibiotics per internal medicine.  She was recommended to follow-up with cardiology as an outpatient given her cardiomyopathy.  Troponin and EKG were not checked during the second admission.  She was seen by cardiology in late 12/2017 for preoperative risk stratification. She was noted to be in sinus rhythm at that time. Given her cardiomyopathy was felt to be tachy-mediated in 10/2017, she underwent repeat TTE on 01/09/2018 that showed persistently low EF of 20-25%, diffuse HK, Gr1DD, mild to moderate MR, trivial pericardial effusion.  Given she was completely asymptomatic from a cardiac perspective, no further cardiac workup was advised at that time given the need for her urological procedure with planned follow up ischemic testing when stable from a urology perspective.   The patient underwent cystoscopy and right-sided ureteral stent exchange in 01/2018.  Following this, she was admitted from 1/23-1/26 for sepsis of unclear etiology and volume overload requiring IV diuresis. Troponin was 0.06 and not cycled. EKG showed NSR with known LBBB.  She returned to the ED on 2/20 with right flank pain and was found  to have an obstructing right ureteral stone. Cardiology is asked to provide risk stratification. Vitals stable. EKG not done. SCr trending from 1/30--1.35 with a baseline ~ 0.8. Potassium 3.4.   She has done well from a cardiac perspective and denies any chest pain, SOB, dizziness, presyncope or syncope. Appetite has improved. No lower extremity swelling, orthopnea, PND, or early  satiety. Weight is down ~ 8 pounds at home.   Past Medical History:  Diagnosis Date  . Anxiety   . Cardiomyopathy (HCC)    a. 10/2017 Echo: EF 20-25%, diff HK, mild MR. Nl RV size.  . DDD (degenerative disc disease), lumbar   . Diverticulosis   . Elevated troponin   . Essential hypertension   . GERD (gastroesophageal reflux disease)   . History of kidney stones   . Hyperlipidemia   . LBBB (left bundle branch block)   . PAF (paroxysmal atrial fibrillation) (HCC)    a.  Diagnosed 10/2017; b.  On Eliquis and amiodarone; c. CHADS2VASc => 5 (CHF, HTN, age x 1, DM, female)  . Pneumonia   . Sciatica   . Type II diabetes mellitus (HCC)     Past Surgical History:  Procedure Laterality Date  . ABDOMINAL HYSTERECTOMY    . CHOLECYSTECTOMY    . CYSTOSCOPY W/ RETROGRADES Bilateral 05/24/2017   Procedure: CYSTOSCOPY WITH RETROGRADE PYELOGRAM;  Surgeon: Vanna Scotland, MD;  Location: [facility];  Service: Urology;  Laterality: Bilateral;  . CYSTOSCOPY W/ RETROGRADES Right 01/29/2018   Procedure: CYSTOSCOPY WITH RETROGRADE PYELOGRAM;  Surgeon: Vanna Scotland, MD;  Location: [facility];  Service: Urology;  Laterality: Right;  . CYSTOSCOPY W/ URETERAL STENT PLACEMENT Right 11/02/2017   Procedure: CYSTOSCOPY WITH RETROGRADE PYELOGRAM/URETERAL STENT PLACEMENT;  Surgeon: Crista Elliot, MD;  Location: [facility];  Service: Urology;  Laterality: Right;  . CYSTOSCOPY/URETEROSCOPY/HOLMIUM LASER/STENT PLACEMENT Left 05/24/2017   Procedure: CYSTOSCOPY/URETEROSCOPY/HOLMIUM LASER/STENT PLACEMENT;  Surgeon: Vanna Scotland, MD;  Location: [facility];  Service: Urology;  Laterality: Left;  . CYSTOSCOPY/URETEROSCOPY/HOLMIUM LASER/STENT PLACEMENT Right 01/29/2018   Procedure: CYSTOSCOPY/URETEROSCOPY/HOLMIUM LASER/STENT Exchange;  Surgeon: Vanna Scotland, MD;  Location: [facility];  Service: Urology;  Laterality: Right;     Home Meds: Prior to Admission medications   Medication Sig Start Date End Date Taking?  Authorizing Provider  amiodarone (PACERONE) 200 MG tablet Take 1 tablet (200 mg total) by mouth daily. 01/09/18  Yes Brylea Pita, Raymon Mutton, PA-C  apixaban (ELIQUIS) 5 MG TABS tablet Take 1 tablet (5 mg total) by mouth 2 (two) times daily. 11/13/17  Yes Katha Hamming, MD  carvedilol (COREG) 3.125 MG tablet Take 1 tablet (3.125 mg total) by mouth 2 (two) times daily with a meal. 11/13/17  Yes Katha Hamming, MD  docusate sodium (COLACE) 100 MG capsule Take 1 capsule (100 mg total) by mouth 2 (two) times daily. 01/29/18  Yes Vanna Scotland, MD  furosemide (LASIX) 20 MG tablet Take 20 mg by mouth daily.   Yes [provider]  HYDROcodone-acetaminophen (NORCO/VICODIN) 5-325 MG tablet Take 1-2 tablets by mouth every 6 (six) hours as needed for moderate pain. 01/29/18  Yes Vanna Scotland, MD  lisinopril (PRINIVIL,ZESTRIL) 5 MG tablet Take 1 tablet (5 mg total) by mouth daily. 01/11/18 04/11/18 Yes Khaliya Golinski, Raymon Mutton, PA-C  metFORMIN (GLUCOPHAGE-XR) 500 MG 24 hr tablet Take 500 mg by mouth 2 (two) times daily.   Yes [provider]  Multiple Vitamin (MULTIVITAMIN WITH MINERALS) TABS tablet Take 1 tablet by mouth daily.   Yes [provider]  pantoprazole (PROTONIX)  40 MG tablet Take 1 tablet (40 mg total) by mouth daily before breakfast. 02/03/18  Yes Wieting, Richard, MD  PARoxetine (PAXIL) 20 MG tablet Take 20 mg by mouth at bedtime.   Yes [provider]  potassium chloride SA (K-DUR,KLOR-CON) 20 MEQ tablet Take 2 tablets (40 mEq total) by mouth at bedtime. 02/03/18  Yes Wieting, Richard, MD  rosuvastatin (CRESTOR) 40 MG tablet Take 1 tablet (40 mg total) by mouth daily. 11/13/17  Yes Katha HammingKonidena, Snehalatha, MD  sitaGLIPtin (JANUVIA) 100 MG tablet Take 100 mg by mouth daily.   Yes [provider]  traZODone (DESYREL) 50 MG tablet Take 1 tablet by mouth at bedtime. 11/30/17  Yes [provider]  feeding supplement, GLUCERNA SHAKE, (GLUCERNA SHAKE) LIQD Take 237 mLs  by mouth 2 (two) times daily between meals. 12/22/17   Ramonita LabGouru, Aruna, MD    Inpatient Medications: Scheduled Meds: . amiodarone  200 mg Oral Daily  . carvedilol  3.125 mg Oral BID WC  . enoxaparin (LOVENOX) injection  40 mg Subcutaneous Q24H  . pantoprazole  40 mg Oral QAC breakfast  . PARoxetine  20 mg Oral QHS  . rosuvastatin  40 mg Oral q1800  . traZODone  50 mg Oral QHS   Continuous Infusions:  PRN Meds: acetaminophen **OR** acetaminophen, ondansetron **OR** ondansetron (ZOFRAN) IV, oxyCODONE  Allergies:   Allergies  Allergen Reactions  . Codeine Nausea Only    Social History:   Social History   Socioeconomic History  . Marital status: Widowed    Spouse name: Not on file  . Number of children: Not on file  . Years of education: Not on file  . Highest education level: Not on file  Occupational History  . Not on file  Social Needs  . Financial resource strain: Patient refused  . Food insecurity:    Worry: Patient refused    Inability: Patient refused  . Transportation needs:    Medical: Patient refused    Non-medical: Patient refused  Tobacco Use  . Smoking status: Former Smoker    Packs/day: 1.50    Years: 15.00    Pack years: 22.50    Types: Cigarettes    Last attempt to quit: 05/12/1997    Years since quitting: 20.8  . Smokeless tobacco: Never Used  Substance and Sexual Activity  . Alcohol use: Not Currently  . Drug use: Never  . Sexual activity: Not Currently  Lifestyle  . Physical activity:    Days per week: Patient refused    Minutes per session: Patient refused  . Stress: Patient refused  Relationships  . Social connections:    Talks on phone: Patient refused    Gets together: Patient refused    Attends religious service: Patient refused    Active member of club or organization: Patient refused    Attends meetings of clubs or organizations: Patient refused    Relationship status: Patient refused  . Intimate partner violence:    Fear of  current or ex partner: Patient refused    Emotionally abused: Patient refused    Physically abused: Patient refused    Forced sexual activity: Patient refused  Other Topics Concern  . Not on file  Social History Narrative  . Not on file     Family History:   Family History  Problem Relation Age of Onset  . Heart attack Father 4449  . Bladder Cancer Neg Hx   . Kidney cancer Neg Hx     ROS:  Review of  Systems  Constitutional: Positive for malaise/fatigue. Negative for chills, diaphoresis, fever and weight loss.  HENT: Negative for congestion.   Eyes: Negative for discharge and redness.  Respiratory: Negative for cough, hemoptysis, sputum production, shortness of breath and wheezing.   Cardiovascular: Negative for chest pain, palpitations, orthopnea, claudication, leg swelling and PND.  Gastrointestinal: Negative for abdominal pain, blood in stool, heartburn, melena, nausea and vomiting.  Genitourinary: Positive for flank pain. Negative for hematuria.  Musculoskeletal: Negative for falls and myalgias.  Skin: Negative for rash.  Neurological: Positive for weakness. Negative for dizziness, tingling, tremors, sensory change, speech change, focal weakness and loss of consciousness.  Endo/Heme/Allergies: Does not bruise/bleed easily.  Psychiatric/Behavioral: Negative for substance abuse. The patient is not nervous/anxious.   All other systems reviewed and are negative.     Physical Exam/Data:   Vitals:   03/02/18 0600 03/02/18 0700 03/02/18 0743 03/02/18 0800  BP: (!) 113/44 112/61 129/62 (!) 128/47  Pulse: (!) 59 61 64 63  Resp: 16 18  18   Temp:      TempSrc:      SpO2: 95% 98%  97%  Weight:      Height:       No intake or output data in the 24 hours ending 03/02/18 0915 Filed Weights   03/01/18 1913  Weight: 77.1 kg   Body mass index is 31.09 kg/m.   Physical Exam: General: Well developed, well nourished, in no acute distress. Head: Normocephalic, atraumatic, sclera  non-icteric, no xanthomas, nares without discharge.  Neck: Negative for carotid bruits. JVD not elevated. Lungs: Clear bilaterally to auscultation without wheezes, rales, or rhonchi. Breathing is unlabored. Heart: RRR with S1 S2. No murmurs, rubs, or gallops appreciated. Abdomen: Soft, non-tender, non-distended with normoactive bowel sounds. No hepatomegaly. No rebound/guarding. No obvious abdominal masses. Msk:  Strength and tone appear normal for age. Extremities: No clubbing or cyanosis. No edema. Distal pedal pulses are 2+ and equal bilaterally. Neuro: Alert and oriented X 3. No facial asymmetry. No focal deficit. Moves all extremities spontaneously. Psych:  Responds to questions appropriately with a normal affect.   EKG:  The EKG was personally reviewed and demonstrates: not done Telemetry:  Telemetry was personally reviewed and demonstrates: not on telemetry   Weights: Filed Weights   03/01/18 1913  Weight: 77.1 kg    Relevant CV Studies: TTE 10/2017: Study Conclusions  - Left ventricle: The cavity size was mildly dilated. Systolic   function was severely reduced. The estimated ejection fraction   was in the range of 20% to 25%. Diffuse hypokinesis. Regional   wall motion abnormalities cannot be excluded. The study is not   technically sufficient to allow evaluation of LV diastolic   function. - Mitral valve: There was mild regurgitation. - Left atrium: The atrium was normal in size. - Right ventricle: Systolic function was normal. - Pulmonary arteries: Systolic pressure could not be accurately   estimated.  Impressions:  - Rhythm is wide complex tachycardia rate 129 bpm.   TTE 12/2017: Study Conclusions  - Limited study for evaluation of left ventricular systolic   function. - Left ventricle: Systolic function was severely reduced. The   estimated ejection fraction was in the range of 20% to 25%.   Diffuse hypokinesis. Regional wall motion abnormalities  cannot be   excluded. Doppler parameters are consistent with abnormal left   ventricular relaxation (grade 1 diastolic dysfunction). Doppler   parameters are consistent with high ventricular filling pressure. - Mitral valve: Calcified annulus. There  was mild to moderate   regurgitation. - Pericardium, extracardiac: A trivial pericardial effusion was   identified.  Laboratory Data:  Chemistry Recent Labs  Lab 03/01/18 1924 03/02/18 0646  NA 135 135  K 3.5 3.4*  CL 104 107  CO2 18* 22  GLUCOSE 165* 150*  BUN 19 17  CREATININE 1.30* 1.35*  CALCIUM 9.7 8.8*  GFRNONAA 42* 40*  GFRAA 48* 46*  ANIONGAP 13 6    No results for input(s): PROT, ALBUMIN, AST, ALT, ALKPHOS, BILITOT in the last 168 hours. Hematology Recent Labs  Lab 03/01/18 1924 03/02/18 0646  WBC 9.4 9.2  RBC 4.75 3.99  HGB 12.3 10.3*  HCT 37.7 32.4*  MCV 79.4* 81.2  MCH 25.9* 25.8*  MCHC 32.6 31.8  RDW 15.2 15.5  PLT 123* 87*   Cardiac EnzymesNo results for input(s): TROPONINI in the last 168 hours. No results for input(s): TROPIPOC in the last 168 hours.  BNPNo results for input(s): BNP, PROBNP in the last 168 hours.  DDimer No results for input(s): DDIMER in the last 168 hours.  Radiology/Studies:  Ct Renal Stone Study  Result Date: 03/01/2018 IMPRESSION: 1. 5 x 5 x 12 mm obstructing stone at the right ureteropelvic junction with right-sided pelvicaliectasis and perinephric stranding. 2. 5 mm nonobstructing stone in lower pole of left kidney. 3. Sigmoid diverticulosis, no findings of acute diverticulitis. Electronically Signed   By: Mitzi HansenLance  Furusawa-Stratton M.D.   On: 03/01/2018 21:27    Assessment and Plan:   1. Preoperative cardiac evaluation: -Revised Cardiac Index: low risk for noncardiac surgery with a 0.9% risk of adverse event in the perioperative period  -[facility] Preoperative risk Index: > 4 METs -No further cardiac testing is needed in the preoperative time frame  2. HFrEF: -She does not  appear volume up -Recent echo as above, no indication to repeat  -Her documented weight is down 4 kg from her visit on 01/09/2018 -Continue Coreg -Escalation of evidence based HF therapy including ACEi/ARB/spiro/ARNI have been limited by relative hypotension and AKI in the setting of recurrent renal stones and UTI -Given her current AKI we will hold off on escalating this therapy at this time, though this should be revisited prior to discharge or in outpatient follow up  3. PAF/SVT: -Not on telemetry -Check EKG to confirm she is maintaining sinus rhythm (has a known LBBB) -Her Eliquis has been held since being in the ED with her last dose being in the morning of 2/20 -She indicates they are planning for intervention of her renal stone this afternoon -If her procedure will be delayed beyond this, I would recommend heparin gtt in order to reduce stroke risk -CHADS2VASc of at least 6 (CHF, HTN, age x 1, DM, vascular disease, female) -Resume Eliquis when felt safe from a urological perspective   4. History of elevated troponin: -Noted in 10/2017 and again in 01/2018 -Previously, she has refused ischemic evaluation -No chest pain -She will need outpatient ischemic evaluation   5. Anemia: -Stable -Monitor  6. HTN: -Well controlled -Continue current medications    For questions or updates, please contact [facility] Please consult [email or web address] for contact info under Cardiology/STEMI.   Signed, Eula Listenyan Karita Dralle, PA-C [facility] Pager: [phone] 03/02/2018, 9:15 AM

## 2018-03-02 NOTE — H&P (Signed)
[facility] - [place] at [facility]   PATIENT NAME: Kristina Archer    MR#:  [id number]  DATE OF BIRTH:  Jun 16, 1945  DATE OF ADMISSION:  03/01/2018  PRIMARY CARE PHYSICIAN: Corky Downs, MD   REQUESTING/REFERRING PHYSICIAN: Sharma Covert, MD  CHIEF COMPLAINT:   Chief Complaint  Patient presents with  . Flank Pain    HISTORY OF PRESENT ILLNESS:  Kristina Archer  is a 73 y.o. female who presents with chief complaint as above.  Patient presents to the ED with a complaint of right flank pain.  She has a past history of significant renal stones requiring operative intervention with stent placement multiple times in the past.  Evaluation in the ED tonight shows the same.  Urology was contacted and will follow along to see this patient.  She is on anticoagulation which will likely need to be held prior to any operative procedure.  Hospitalist were called for admission.  PAST MEDICAL HISTORY:   Past Medical History:  Diagnosis Date  . Anxiety   . Cardiomyopathy (HCC)    a. 10/2017 Echo: EF 20-25%, diff HK, mild MR. Nl RV size.  . DDD (degenerative disc disease), lumbar   . Diverticulosis   . Elevated troponin   . Essential hypertension   . GERD (gastroesophageal reflux disease)   . History of kidney stones   . Hyperlipidemia   . LBBB (left bundle branch block)   . PAF (paroxysmal atrial fibrillation) (HCC)    a.  Diagnosed 10/2017; b.  On Eliquis and amiodarone; c. CHADS2VASc => 5 (CHF, HTN, age x 1, DM, female)  . Pneumonia   . Sciatica   . Type II diabetes mellitus (HCC)      PAST SURGICAL HISTORY:   Past Surgical History:  Procedure Laterality Date  . ABDOMINAL HYSTERECTOMY    . CHOLECYSTECTOMY    . CYSTOSCOPY W/ RETROGRADES Bilateral 05/24/2017   Procedure: CYSTOSCOPY WITH RETROGRADE PYELOGRAM;  Surgeon: Vanna Scotland, MD;  Location: [facility];  Service: Urology;  Laterality: Bilateral;  . CYSTOSCOPY W/ RETROGRADES Right 01/29/2018   Procedure:  CYSTOSCOPY WITH RETROGRADE PYELOGRAM;  Surgeon: Vanna Scotland, MD;  Location: [facility];  Service: Urology;  Laterality: Right;  . CYSTOSCOPY W/ URETERAL STENT PLACEMENT Right 11/02/2017   Procedure: CYSTOSCOPY WITH RETROGRADE PYELOGRAM/URETERAL STENT PLACEMENT;  Surgeon: Crista Elliot, MD;  Location: [facility];  Service: Urology;  Laterality: Right;  . CYSTOSCOPY/URETEROSCOPY/HOLMIUM LASER/STENT PLACEMENT Left 05/24/2017   Procedure: CYSTOSCOPY/URETEROSCOPY/HOLMIUM LASER/STENT PLACEMENT;  Surgeon: Vanna Scotland, MD;  Location: [facility];  Service: Urology;  Laterality: Left;  . CYSTOSCOPY/URETEROSCOPY/HOLMIUM LASER/STENT PLACEMENT Right 01/29/2018   Procedure: CYSTOSCOPY/URETEROSCOPY/HOLMIUM LASER/STENT Exchange;  Surgeon: Vanna Scotland, MD;  Location: [facility];  Service: Urology;  Laterality: Right;     SOCIAL HISTORY:   Social History   Tobacco Use  . Smoking status: Former Smoker    Packs/day: 1.50    Years: 15.00    Pack years: 22.50    Types: Cigarettes    Last attempt to quit: 05/12/1997    Years since quitting: 20.8  . Smokeless tobacco: Never Used  Substance Use Topics  . Alcohol use: Not Currently     FAMILY HISTORY:   Family History  Problem Relation Age of Onset  . Heart attack Father 47  . Bladder Cancer Neg Hx   . Kidney cancer Neg Hx      DRUG ALLERGIES:   Allergies  Allergen Reactions  . Codeine Nausea Only    MEDICATIONS AT HOME:  Prior to Admission medications   Medication Sig Start Date End Date Taking? Authorizing Provider  amiodarone (PACERONE) 200 MG tablet Take 1 tablet (200 mg total) by mouth daily. 01/09/18  Yes Dunn, Raymon Mutton, PA-C  apixaban (ELIQUIS) 5 MG TABS tablet Take 1 tablet (5 mg total) by mouth 2 (two) times daily. 11/13/17  Yes Katha Hamming, MD  carvedilol (COREG) 3.125 MG tablet Take 1 tablet (3.125 mg total) by mouth 2 (two) times daily with a meal. 11/13/17  Yes Katha Hamming, MD  docusate sodium (COLACE) 100  MG capsule Take 1 capsule (100 mg total) by mouth 2 (two) times daily. 01/29/18  Yes Vanna Scotland, MD  furosemide (LASIX) 20 MG tablet Take 20 mg by mouth daily.   Yes [provider]  HYDROcodone-acetaminophen (NORCO/VICODIN) 5-325 MG tablet Take 1-2 tablets by mouth every 6 (six) hours as needed for moderate pain. 01/29/18  Yes Vanna Scotland, MD  lisinopril (PRINIVIL,ZESTRIL) 5 MG tablet Take 1 tablet (5 mg total) by mouth daily. 01/11/18 04/11/18 Yes Dunn, Raymon Mutton, PA-C  metFORMIN (GLUCOPHAGE-XR) 500 MG 24 hr tablet Take 500 mg by mouth 2 (two) times daily.   Yes [provider]  Multiple Vitamin (MULTIVITAMIN WITH MINERALS) TABS tablet Take 1 tablet by mouth daily.   Yes [provider]  pantoprazole (PROTONIX) 40 MG tablet Take 1 tablet (40 mg total) by mouth daily before breakfast. 02/03/18  Yes Wieting, Richard, MD  PARoxetine (PAXIL) 20 MG tablet Take 20 mg by mouth at bedtime.   Yes [provider]  potassium chloride SA (K-DUR,KLOR-CON) 20 MEQ tablet Take 2 tablets (40 mEq total) by mouth at bedtime. 02/03/18  Yes Wieting, Richard, MD  rosuvastatin (CRESTOR) 40 MG tablet Take 1 tablet (40 mg total) by mouth daily. 11/13/17  Yes Katha Hamming, MD  sitaGLIPtin (JANUVIA) 100 MG tablet Take 100 mg by mouth daily.   Yes [provider]  traZODone (DESYREL) 50 MG tablet Take 1 tablet by mouth at bedtime. 11/30/17  Yes [provider]  feeding supplement, GLUCERNA SHAKE, (GLUCERNA SHAKE) LIQD Take 237 mLs by mouth 2 (two) times daily between meals. 12/22/17   Ramonita Lab, MD    REVIEW OF SYSTEMS:  Review of Systems  Constitutional: Negative for chills, fever, malaise/fatigue and weight loss.  HENT: Negative for ear pain, hearing loss and tinnitus.   Eyes: Negative for blurred vision, double vision, pain and redness.  Respiratory: Negative for cough, hemoptysis and shortness of breath.   Cardiovascular: Negative for chest pain,  palpitations, orthopnea and leg swelling.  Gastrointestinal: Negative for abdominal pain, constipation, diarrhea, nausea and vomiting.  Genitourinary: Positive for flank pain. Negative for dysuria, frequency and hematuria.  Musculoskeletal: Positive for back pain. Negative for joint pain and neck pain.  Skin:       No acne, rash, or lesions  Neurological: Negative for dizziness, tremors, focal weakness and weakness.  Endo/Heme/Allergies: Negative for polydipsia. Does not bruise/bleed easily.  Psychiatric/Behavioral: Negative for depression. The patient is not nervous/anxious and does not have insomnia.      VITAL SIGNS:   Vitals:   03/01/18 2315 03/01/18 2330 03/01/18 2345 03/02/18 0015  BP: 128/62 (!) 138/57 (!) 114/58 (!) 121/41  Pulse: 69 70 68 66  Resp:    12  Temp:      TempSrc:      SpO2: (!) 86% (!) 87% 93% 94%  Weight:      Height:       Wt Readings from Last  3 Encounters:  03/01/18 77.1 kg  02/04/18 79.9 kg  01/09/18 81.7 kg    PHYSICAL EXAMINATION:  Physical Exam  Vitals reviewed. Constitutional: She is oriented to person, place, and time. She appears well-developed and well-nourished. No distress.  HENT:  Head: Normocephalic and atraumatic.  Mouth/Throat: Oropharynx is clear and moist.  Eyes: Pupils are equal, round, and reactive to light. Conjunctivae and EOM are normal. No scleral icterus.  Neck: Normal range of motion. Neck supple. No JVD present. No thyromegaly present.  Cardiovascular: Normal rate, regular rhythm and intact distal pulses. Exam reveals no gallop and no friction rub.  No murmur heard. Respiratory: Effort normal and breath sounds normal. No respiratory distress. She has no wheezes. She has no rales.  GI: Soft. Bowel sounds are normal. She exhibits no distension. There is abdominal tenderness.  Musculoskeletal: Normal range of motion.        General: No edema.     Comments: No arthritis, no gout  Lymphadenopathy:    She has no cervical  adenopathy.  Neurological: She is alert and oriented to person, place, and time. No cranial nerve deficit.  No dysarthria, no aphasia  Skin: Skin is warm and dry. No rash noted. No erythema.  Psychiatric: She has a normal mood and affect. Her behavior is normal. Judgment and thought content normal.    LABORATORY PANEL:   CBC Recent Labs  Lab 03/01/18 1924  WBC 9.4  HGB 12.3  HCT 37.7  PLT 123*   ------------------------------------------------------------------------------------------------------------------  Chemistries  Recent Labs  Lab 03/01/18 1924  NA 135  K 3.5  CL 104  CO2 18*  GLUCOSE 165*  BUN 19  CREATININE 1.30*  CALCIUM 9.7   ------------------------------------------------------------------------------------------------------------------  Cardiac Enzymes No results for input(s): TROPONINI in the last 168 hours. ------------------------------------------------------------------------------------------------------------------  RADIOLOGY:  Ct Renal Stone Study  Result Date: 03/01/2018 CLINICAL DATA:  73 y/o F; right flank pain, recurrent stone disease suspected. EXAM: CT ABDOMEN AND PELVIS WITHOUT CONTRAST TECHNIQUE: Multidetector CT imaging of the abdomen and pelvis was performed following the standard protocol without IV contrast. COMPARISON:  02/01/2018 CT abdomen and pelvis. 02/23/2018 abdominal ultrasound. FINDINGS: Lower chest: Stable cardiomegaly. Hepatobiliary: No focal liver abnormality is seen. Status post cholecystectomy. No biliary dilatation. Pancreas: Unremarkable. No pancreatic ductal dilatation or surrounding inflammatory changes. Spleen: Normal in size without focal abnormality. Adrenals/Urinary Tract: Normal adrenal glands. 5 mm stone in lower pole of left kidney. 5 x 5 x 12 mm stone at the right ureteropelvic junction with right-sided pelvicaliectasis and perinephric stranding. The downstream ureter is nondilated. Decompressed bladder.  Stomach/Bowel: Stomach is within normal limits. Appendix appears normal. No evidence of bowel wall thickening, distention, or inflammatory changes. Sigmoid diverticulosis, no findings of acute diverticulitis. Vascular/Lymphatic: Aortic atherosclerosis. No enlarged abdominal or pelvic lymph nodes. Reproductive: Status post hysterectomy. No adnexal masses. Other: No abdominal wall hernia or abnormality. No abdominopelvic ascites. Musculoskeletal: No fracture is seen. Multilevel degenerative changes of the lumbar spine. L5-S1 grade 1 anterolisthesis. IMPRESSION: 1. 5 x 5 x 12 mm obstructing stone at the right ureteropelvic junction with right-sided pelvicaliectasis and perinephric stranding. 2. 5 mm nonobstructing stone in lower pole of left kidney. 3. Sigmoid diverticulosis, no findings of acute diverticulitis. Electronically Signed   By: Mitzi HansenLance  Furusawa-Stratton M.D.   On: 03/01/2018 21:27    EKG:   Orders placed or performed during the hospital encounter of 02/01/18  . ED EKG 12-Lead  . ED EKG 12-Lead  . EKG 12-Lead  . EKG 12-Lead  IMPRESSION AND PLAN:  Principal Problem:   Ureterolithiasis -large right-sided obstructing renal stone.  Analgesia in the ED has helped her symptoms.  We will get a urology consult as she will likely need operative intervention and stent placement.  However, she is on Eliquis for her A. fib and will likely need this to be held prior to any operation.  Given her history of significant heart failure it may also be a good idea to get cardiac clearance prior to any operative intervention. Active Problems:   Chronic systolic CHF (congestive heart failure) (HCC) -not in exacerbation at this time.  Last EF on echo per my chart review was 20-25%.  Continue home meds   PAF (paroxysmal atrial fibrillation) (HCC) -continue home meds, hold Eliquis for now in anticipation of operative procedure   HLD (hyperlipidemia) -home dose antilipid   Anxiety -home dose anxiolytic  Chart  review performed and case discussed with ED provider. Labs, imaging and/or ECG reviewed by provider and discussed with patient/family. Management plans discussed with the patient and/or family.  DVT PROPHYLAXIS: SubQ lovenox   GI PROPHYLAXIS:  None  ADMISSION STATUS: Inpatient     CODE STATUS: Full Code Status History    Date Active Date Inactive Code Status Order ID Comments User Context   02/01/2018 0550 02/04/2018 1538 Full Code [id number]  Arnaldo Natal, MD ED   12/18/2017 0210 12/22/2017 1710 DNR [id number]  Cammy Copa, MD Inpatient   11/04/2017 1129 11/13/2017 1657 DNR [id number]  Katha Hamming, MD Inpatient   11/02/2017 1524 11/04/2017 1129 Full Code [id number]  Katha Hamming, MD ED      TOTAL TIME TAKING CARE OF THIS PATIENT: 45 minutes.   Barney Drain 03/02/2018, 12:23 AM  [facility]  Office  [phone]  CC: Primary care physician; Corky Downs, MD  Note:  This document was prepared using Dragon voice recognition software and may include unintentional dictation errors.

## 2018-03-02 NOTE — Consult Note (Signed)
Pharmacy Antibiotic Note  Kristina Archer is a 73 y.o. female admitted on 03/01/2018 with pyelonephritis.  Pharmacy has been consulted for Zosyn dosing.  Plan: Zosyn 3.375g IV q8h (4 hour infusion).  Height: 5\' 2"  (157.5 cm) Weight: 170 lb (77.1 kg) IBW/kg (Calculated) : 50.1  Temp (24hrs), Avg:97.6 F (36.4 C), Min:97.2 F (36.2 C), Max:98.1 F (36.7 C)  Recent Labs  Lab 03/01/18 1924 03/02/18 0646  WBC 9.4 9.2  CREATININE 1.30* 1.35*    Estimated Creatinine Clearance: 37.3 mL/min (A) (by C-G formula based on SCr of 1.35 mg/dL (H)).    Allergies  Allergen Reactions  . Codeine Nausea Only    Antimicrobials this admission: Zosyn 2/21 >>   Microbiology results: 1/23 BCx: NGF 2/21 UCx: pending   Thank you for allowing pharmacy to be a part of this patient's care.  Lowella Bandy, PharmD 03/02/2018 1:17 PM

## 2018-03-02 NOTE — ED Notes (Signed)
OR at bedside and taking patient for procedure.

## 2018-03-02 NOTE — Anesthesia Procedure Notes (Signed)
Procedure Name: Intubation Performed by: Lesle Reek, CRNA Pre-anesthesia Checklist: Timeout performed, Patient being monitored, Suction available, Emergency Drugs available and Patient identified Patient Re-evaluated:Patient Re-evaluated prior to induction Oxygen Delivery Method: Circle system utilized Preoxygenation: Pre-oxygenation with 100% oxygen Induction Type: IV induction Laryngoscope Size: Mac Grade View: Grade I Tube type: Oral Tube size: 7.0 mm Number of attempts: 1 Airway Equipment and Method: Stylet Placement Confirmation: ETT inserted through vocal cords under direct vision,  positive ETCO2,  breath sounds checked- equal and bilateral and CO2 detector Secured at: 22 cm Tube secured with: Tape

## 2018-03-02 NOTE — Transfer of Care (Signed)
Immediate Anesthesia Transfer of Care Note  Patient: Kristina Archer  Procedure(s) Performed: CYSTOSCOPY WITH STENT PLACEMENT-RIGHT (Right ) CYSTOSCOPY WITH RETROGRADE PYELOGRAM (Right )  Patient Location: PACU  Anesthesia Type:General  Level of Consciousness: awake and alert   Airway & Oxygen Therapy: Patient Spontanous Breathing and Patient connected to face mask oxygen  Post-op Assessment: Report given to RN  Post vital signs: Reviewed and stable  Last Vitals:  Vitals Value Taken Time  BP    Temp    Pulse 65 03/02/2018 12:37 PM  Resp 26 03/02/2018 12:37 PM  SpO2 96 % 03/02/2018 12:37 PM  Vitals shown include unvalidated device data.  Last Pain:  Vitals:   03/02/18 1058  TempSrc: Temporal  PainSc: 5          Complications: No apparent anesthesia complications

## 2018-03-02 NOTE — Anesthesia Preprocedure Evaluation (Signed)
Anesthesia Evaluation  Patient identified by MRN, date of birth, ID band Patient awake    Reviewed: Allergy & Precautions, H&P , NPO status , Patient's Chart, lab work & pertinent test results  History of Anesthesia Complications Negative for: history of anesthetic complications  Airway Mallampati: III  TM Distance: <3 FB Neck ROM: limited    Dental  (+) Poor Dentition, Missing, Upper Dentures, Lower Dentures, Dental Advidsory Given   Pulmonary neg shortness of breath, pneumonia, former smoker,           Cardiovascular Exercise Tolerance: Good hypertension, (-) angina+CHF  + dysrhythmias Atrial Fibrillation      Neuro/Psych PSYCHIATRIC DISORDERS Anxiety  Neuromuscular disease    GI/Hepatic Neg liver ROS, GERD  Medicated and Controlled,  Endo/Other  diabetes, Type 2  Renal/GU Renal disease (kidney stones)     Musculoskeletal  (+) Arthritis ,   Abdominal   Peds  Hematology negative hematology ROS (+)   Anesthesia Other Findings Past Medical History: No date: Anxiety No date: Cardiomyopathy (HCC)     Comment:  a. 10/2017 Echo: EF 20-25%, diff HK, mild MR. Nl RV               size. No date: DDD (degenerative disc disease), lumbar No date: Diverticulosis No date: Elevated troponin No date: Essential hypertension No date: GERD (gastroesophageal reflux disease) No date: History of kidney stones No date: Hyperlipidemia No date: LBBB (left bundle branch block) No date: PAF (paroxysmal atrial fibrillation) (HCC)     Comment:  a.  Diagnosed 10/2017; b.  On Eliquis and amiodarone; c.              CHADS2VASc => 5 (CHF, HTN, age x 1, DM, female) No date: Pneumonia No date: Sciatica No date: Type II diabetes mellitus (HCC)     Reproductive/Obstetrics negative OB ROS                             Anesthesia Physical  Anesthesia Plan  ASA: IV  Anesthesia Plan: General ETT   Post-op Pain  Management:    Induction: Intravenous  PONV Risk Score and Plan: Ondansetron, Dexamethasone and Treatment may vary due to age or medical condition  Airway Management Planned: Oral ETT  Additional Equipment:   Intra-op Plan:   Post-operative Plan: Extubation in OR and Possible Post-op intubation/ventilation  Informed Consent: I have reviewed the patients History and Physical, chart, labs and discussed the procedure including the risks, benefits and alternatives for the proposed anesthesia with the patient or authorized representative who has indicated his/her understanding and acceptance.     Dental Advisory Given  Plan Discussed with: Anesthesiologist, CRNA and Surgeon  Anesthesia Plan Comments: (Patient has cardiac clearance for this procedure.   Patient informed that they are higher risk for complications from anesthesia during this procedure due to their medical history.  Patient voiced understanding.  Patient consented for risks of anesthesia including but not limited to:  - adverse reactions to medications - damage to teeth, lips or other oral mucosa - sore throat or hoarseness - Damage to heart, brain, lungs or loss of life)        Anesthesia Quick Evaluation  

## 2018-03-03 DIAGNOSIS — I5022 Chronic systolic (congestive) heart failure: Secondary | ICD-10-CM

## 2018-03-03 DIAGNOSIS — N201 Calculus of ureter: Secondary | ICD-10-CM | POA: Diagnosis not present

## 2018-03-03 LAB — BASIC METABOLIC PANEL
Anion gap: 5 (ref 5–15)
BUN: 15 mg/dL (ref 8–23)
CO2: 25 mmol/L (ref 22–32)
Calcium: 9.1 mg/dL (ref 8.9–10.3)
Chloride: 106 mmol/L (ref 98–111)
Creatinine, Ser: 1.34 mg/dL — ABNORMAL HIGH (ref 0.44–1.00)
GFR calc Af Amer: 46 mL/min — ABNORMAL LOW (ref >60)
GFR calc non Af Amer: 40 mL/min — ABNORMAL LOW (ref >60)
Glucose, Bld: 146 mg/dL — ABNORMAL HIGH (ref 70–99)
Potassium: 3.2 mmol/L — ABNORMAL LOW (ref 3.5–5.1)
Sodium: 136 mmol/L (ref 135–145)

## 2018-03-03 LAB — URINE CULTURE: Culture: 30000 — AB

## 2018-03-03 LAB — CBC
HCT: 29.9 % — ABNORMAL LOW (ref 36.0–46.0)
Hemoglobin: 9.6 g/dL — ABNORMAL LOW (ref 12.0–15.0)
MCH: 26.2 pg (ref 26.0–34.0)
MCHC: 32.1 g/dL (ref 30.0–36.0)
MCV: 81.5 fL (ref 80.0–100.0)
Platelets: 85 10*3/uL — ABNORMAL LOW (ref 150–400)
RBC: 3.67 MIL/uL — ABNORMAL LOW (ref 3.87–5.11)
RDW: 15.4 % (ref 11.5–15.5)
WBC: 7.2 10*3/uL (ref 4.0–10.5)
nRBC: 0 % (ref 0.0–0.2)

## 2018-03-03 LAB — MAGNESIUM: Magnesium: 1.9 mg/dL (ref 1.7–2.4)

## 2018-03-03 LAB — INFLUENZA PANEL BY PCR (TYPE A & B)
Influenza A By PCR: NEGATIVE
Influenza B By PCR: NEGATIVE

## 2018-03-03 MED ORDER — TAMSULOSIN HCL 0.4 MG PO CAPS
0.4000 mg | ORAL_CAPSULE | Freq: Every day | ORAL | Status: DC
  Administered 2018-03-03 – 2018-03-07 (×5): 0.4 mg via ORAL
  Filled 2018-03-03 (×6): qty 1

## 2018-03-03 MED ORDER — IBUPROFEN 400 MG PO TABS
400.0000 mg | ORAL_TABLET | Freq: Four times a day (QID) | ORAL | Status: DC | PRN
  Administered 2018-03-03: 400 mg via ORAL
  Filled 2018-03-03 (×2): qty 1

## 2018-03-03 MED ORDER — POTASSIUM CHLORIDE CRYS ER 20 MEQ PO TBCR
40.0000 meq | EXTENDED_RELEASE_TABLET | Freq: Once | ORAL | Status: AC
  Administered 2018-03-03: 40 meq via ORAL
  Filled 2018-03-03: qty 2

## 2018-03-03 NOTE — Progress Notes (Addendum)
[facility] [place] at [facility]   PATIENT NAME: Kristina Archer    MR#:  [id number]  DATE OF BIRTH:  05/05/1945  SUBJECTIVE:  CHIEF COMPLAINT:   Chief Complaint  Patient presents with  . Flank Pain   Still right-sided flank pain and right-sided abdominal cramping. REVIEW OF SYSTEMS:  Review of Systems  Constitutional: Negative for chills, fever and malaise/fatigue.  HENT: Negative for sore throat.   Eyes: Negative for blurred vision and double vision.  Respiratory: Negative for cough, hemoptysis, shortness of breath, wheezing and stridor.   Cardiovascular: Negative for chest pain, palpitations, orthopnea and leg swelling.  Gastrointestinal: Positive for abdominal pain. Negative for blood in stool, diarrhea, melena, nausea and vomiting.  Genitourinary: Positive for flank pain. Negative for dysuria, frequency, hematuria and urgency.  Musculoskeletal: Negative for back pain and joint pain.  Skin: Negative for rash.  Neurological: Negative for dizziness, sensory change, focal weakness, seizures, loss of consciousness, weakness and headaches.  Endo/Heme/Allergies: Negative for polydipsia.  Psychiatric/Behavioral: Negative for depression. The patient is not nervous/anxious.     DRUG ALLERGIES:   Allergies  Allergen Reactions  . Codeine Nausea Only   VITALS:  Blood pressure (!) 123/46, pulse 62, temperature 98.7 F (37.1 C), temperature source Oral, resp. rate 17, height 5\' 2"  (1.575 m), weight 77.1 kg, SpO2 90 %. PHYSICAL EXAMINATION:  Physical Exam Constitutional:      General: She is not in acute distress.    Appearance: Normal appearance.  HENT:     Head: Normocephalic.     Mouth/Throat:     Mouth: Mucous membranes are moist.  Eyes:     General: No scleral icterus.    Conjunctiva/sclera: Conjunctivae normal.     Pupils: Pupils are equal, round, and reactive to light.  Neck:     Musculoskeletal: Normal range of motion and neck supple.   Vascular: No JVD.     Trachea: No tracheal deviation.  Cardiovascular:     Rate and Rhythm: Normal rate and regular rhythm.     Heart sounds: Normal heart sounds. No murmur. No gallop.   Pulmonary:     Effort: Pulmonary effort is normal. No respiratory distress.     Breath sounds: Normal breath sounds. No wheezing or rales.  Abdominal:     General: Bowel sounds are normal. There is no distension.     Palpations: Abdomen is soft.     Tenderness: There is abdominal tenderness. There is no rebound.  Musculoskeletal: Normal range of motion.        General: No tenderness.     Right lower leg: No edema.     Left lower leg: No edema.  Skin:    Findings: No erythema or rash.  Neurological:     General: No focal deficit present.     Mental Status: She is alert and oriented to person, place, and time.     Cranial Nerves: No cranial nerve deficit.  Psychiatric:        Mood and Affect: Mood normal.    LABORATORY PANEL:  Female CBC Recent Labs  Lab 03/03/18 0548  WBC 7.2  HGB 9.6*  HCT 29.9*  PLT 85*   ------------------------------------------------------------------------------------------------------------------ Chemistries  Recent Labs  Lab 03/03/18 0548  NA 136  K 3.2*  CL 106  CO2 25  GLUCOSE 146*  BUN 15  CREATININE 1.34*  CALCIUM 9.1  MG 1.9   RADIOLOGY:  No results found. ASSESSMENT AND PLAN:   Ureterolithiasis -large right-sided obstructing  renal stone.  S/p Cystoscopy, right retrograde pyelogram with intraoperative interpretation, right ureteral stent placement.  Pain control.  Start Flomax.  UTI.  Continue IV Zosyn, follow-up urine culture, discontinue Foley catheter, antibiotics 10 to 14 days per urologist.  ARF.  Follow-up BMP.    Chronic systolic CHF (congestive heart failure) (HCC) -not in exacerbation at this time.  Last EF on echo per my chart review was 20-25%.  Continue home meds   PAF (paroxysmal atrial fibrillation) (HCC) -continue home meds,  resumed Eliquis.   HLD (hyperlipidemia) -home dose antilipid   Anxiety -home dose anxiolytic  Hypokalemia.  Potassium supplement.  All the records are reviewed and case discussed with Care Management/Social Worker. Management plans discussed with the patient, family and they are in agreement.  CODE STATUS: Full Code  TOTAL TIME TAKING CARE OF THIS PATIENT: 28 minutes.   More than 50% of the time was spent in counseling/coordination of care: YES  POSSIBLE D/C IN 1-2 DAYS, DEPENDING ON CLINICAL CONDITION.   Shaune Pollack M.D on 03/03/2018 at 2:26 PM  Between 7am to 6pm - Pager - [phone]  After 6pm go to [email or web address] - Therapist, [facility] Hospitalists

## 2018-03-03 NOTE — Care Management CC44 (Signed)
Condition Code 44 Documentation Completed  Patient Details  Name: BRIAUNA DARGENIO MRN: [id number] Date of Birth: [birth year]   Condition Code 44 given:  Yes Patient signature on Condition Code 44 notice:  Yes Documentation of 2 MD's agreement:  Yes Code 44 added to claim:  Yes    Virgel Manifold, RN 03/03/2018, 1:26 PM

## 2018-03-03 NOTE — Progress Notes (Signed)
Progress Note  Patient Name: Kristina Archer Date of Encounter: 03/03/2018  Primary Cardiologist: Julien Nordmann, MD  Subjective   The patient is a 73 year old female who is followed by Dr. Mariah Milling.  She has a history of cardiomyopathy presumably due to tachycardia.  She has paroxysmal atrial fibrillation and is on Eliquis.  She has a an ejection fraction of around 20 to 25%.  She is refused ischemic work-up for her congestive heart failure.  Noted with a renal stone.  She had ureteral stent placement yesterday.  She is feeling quite a bit better.  Inpatient Medications    Scheduled Meds: . amiodarone  200 mg Oral Daily  . apixaban  5 mg Oral BID  . carvedilol  3.125 mg Oral BID WC  . pantoprazole  40 mg Oral QAC breakfast  . PARoxetine  20 mg Oral QHS  . rosuvastatin  40 mg Oral q1800  . tamsulosin  0.4 mg Oral Daily  . traZODone  50 mg Oral QHS   Continuous Infusions: . sodium chloride Stopped (03/02/18 2216)  . piperacillin-tazobactam (ZOSYN)  IV 3.375 g (03/03/18 0524)   PRN Meds: sodium chloride, acetaminophen **OR** acetaminophen, ibuprofen, ondansetron **OR** ondansetron (ZOFRAN) IV, oxyCODONE   Vital Signs    Vitals:   03/02/18 1537 03/02/18 2056 03/03/18 0456 03/03/18 1129  BP: (!) 118/40 (!) 129/51 (!) 133/51 (!) 123/46  Pulse: 61 63 65 62  Resp: 16 20 20 17   Temp: 97.9 F (36.6 C) 97.7 F (36.5 C) 98.2 F (36.8 C) 98.7 F (37.1 C)  TempSrc: Oral Oral Oral Oral  SpO2: 96% 96% 97% 90%  Weight:      Height:        Intake/Output Summary (Last 24 hours) at 03/03/2018 1225 Last data filed at 03/03/2018 1106 Gross per 24 hour  Intake 111.38 ml  Output 1635 ml  Net -1523.62 ml   Last 3 Weights 03/02/2018 03/01/2018 02/04/2018  Weight (lbs) 170 lb 170 lb 176 lb 3.2 oz  Weight (kg) 77.111 kg 77.111 kg 79.924 kg      Telemetry       ECG       Physical Exam    GEN: Elderly female, seems to be fairly weak but in no acute distress Neck: No  JVD Cardiac: RRR, no murmurs, rubs, or gallops.  Respiratory: Clear to auscultation bilaterally. GI: Soft, nontender, non-distended  MS: No edema; No deformity. Neuro:  Nonfocal  Psych: Normal affect   Labs    Chemistry Recent Labs  Lab 03/01/18 1924 03/02/18 0646 03/03/18 0548  NA 135 135 136  K 3.5 3.4* 3.2*  CL 104 107 106  CO2 18* 22 25  GLUCOSE 165* 150* 146*  BUN 19 17 15   CREATININE 1.30* 1.35* 1.34*  CALCIUM 9.7 8.8* 9.1  GFRNONAA 42* 40* 40*  GFRAA 48* 46* 46*  ANIONGAP 13 6 5      Hematology Recent Labs  Lab 03/01/18 1924 03/02/18 0646 03/03/18 0548  WBC 9.4 9.2 7.2  RBC 4.75 3.99 3.67*  HGB 12.3 10.3* 9.6*  HCT 37.7 32.4* 29.9*  MCV 79.4* 81.2 81.5  MCH 25.9* 25.8* 26.2  MCHC 32.6 31.8 32.1  RDW 15.2 15.5 15.4  PLT 123* 87* 85*    Cardiac EnzymesNo results for input(s): TROPONINI in the last 168 hours. No results for input(s): TROPIPOC in the last 168 hours.   BNPNo results for input(s): BNP, PROBNP in the last 168 hours.   DDimer No results for input(s): DDIMER  in the last 168 hours.   Radiology    Ct Renal Stone Study  Result Date: 03/01/2018 CLINICAL DATA:  73 y/o F; right flank pain, recurrent stone disease suspected. EXAM: CT ABDOMEN AND PELVIS WITHOUT CONTRAST TECHNIQUE: Multidetector CT imaging of the abdomen and pelvis was performed following the standard protocol without IV contrast. COMPARISON:  02/01/2018 CT abdomen and pelvis. 02/23/2018 abdominal ultrasound. FINDINGS: Lower chest: Stable cardiomegaly. Hepatobiliary: No focal liver abnormality is seen. Status post cholecystectomy. No biliary dilatation. Pancreas: Unremarkable. No pancreatic ductal dilatation or surrounding inflammatory changes. Spleen: Normal in size without focal abnormality. Adrenals/Urinary Tract: Normal adrenal glands. 5 mm stone in lower pole of left kidney. 5 x 5 x 12 mm stone at the right ureteropelvic junction with right-sided pelvicaliectasis and perinephric  stranding. The downstream ureter is nondilated. Decompressed bladder. Stomach/Bowel: Stomach is within normal limits. Appendix appears normal. No evidence of bowel wall thickening, distention, or inflammatory changes. Sigmoid diverticulosis, no findings of acute diverticulitis. Vascular/Lymphatic: Aortic atherosclerosis. No enlarged abdominal or pelvic lymph nodes. Reproductive: Status post hysterectomy. No adnexal masses. Other: No abdominal wall hernia or abnormality. No abdominopelvic ascites. Musculoskeletal: No fracture is seen. Multilevel degenerative changes of the lumbar spine. L5-S1 grade 1 anterolisthesis. IMPRESSION: 1. 5 x 5 x 12 mm obstructing stone at the right ureteropelvic junction with right-sided pelvicaliectasis and perinephric stranding. 2. 5 mm nonobstructing stone in lower pole of left kidney. 3. Sigmoid diverticulosis, no findings of acute diverticulitis. Electronically Signed   By: Mitzi Hansen M.D.   On: 03/01/2018 21:27    Cardiac Studies     Patient Profile     73 y.o. female with a chronic systolic congestive heart failure. Ended with a ureteral stone and and is now status post ureteral stent placement.  Assessment & Plan    1.  Preoperative evaluation prior to ureteral stent placement: The patient did fine during her low risk stent placement procedure.  She has seen Dr. Mariah Milling in the office and has declined ischemic work-up for her chronic systolic congestive heart failure. Continue current outpatient medications. May follow-up with Dr. Mariah Milling in the office.  2.  Paroxysmal atrial fibrillation: Would resume Eliquis when she is deemed stable from a urology standpoint. Start carvedilol. [facility] will sign off.   Medication Recommendations:  Continue home meds  Other recommendations (labs, testing, etc):   Follow up as an outpatient:  With Dr. Mariah Milling   For questions or updates, please contact [facility] Please consult [email or web address] for  contact info under        Signed, Kristeen Miss, MD  03/03/2018, 12:25 PM

## 2018-03-04 ENCOUNTER — Observation Stay: Payer: Medicare Other

## 2018-03-04 LAB — BASIC METABOLIC PANEL
ANION GAP: 4 — AB (ref 5–15)
BUN: 17 mg/dL (ref 8–23)
CO2: 24 mmol/L (ref 22–32)
Calcium: 8.9 mg/dL (ref 8.9–10.3)
Chloride: 106 mmol/L (ref 98–111)
Creatinine, Ser: 1.61 mg/dL — ABNORMAL HIGH (ref 0.44–1.00)
GFR calc Af Amer: 37 mL/min — ABNORMAL LOW (ref >60)
GFR calc non Af Amer: 32 mL/min — ABNORMAL LOW (ref >60)
Glucose, Bld: 180 mg/dL — ABNORMAL HIGH (ref 70–99)
Potassium: 3.4 mmol/L — ABNORMAL LOW (ref 3.5–5.1)
Sodium: 134 mmol/L — ABNORMAL LOW (ref 135–145)

## 2018-03-04 LAB — GLUCOSE, CAPILLARY
Glucose-Capillary: 178 mg/dL — ABNORMAL HIGH (ref 70–99)
Glucose-Capillary: 184 mg/dL — ABNORMAL HIGH (ref 70–99)

## 2018-03-04 MED ORDER — BISACODYL 5 MG PO TBEC
5.0000 mg | DELAYED_RELEASE_TABLET | Freq: Every day | ORAL | Status: DC | PRN
  Administered 2018-03-05: 5 mg via ORAL
  Filled 2018-03-04: qty 1

## 2018-03-04 MED ORDER — SALINE SPRAY 0.65 % NA SOLN
1.0000 | NASAL | Status: DC | PRN
  Administered 2018-03-05: 1 via NASAL
  Filled 2018-03-04: qty 44

## 2018-03-04 MED ORDER — FLUCONAZOLE 100 MG PO TABS
100.0000 mg | ORAL_TABLET | Freq: Every day | ORAL | Status: DC
  Administered 2018-03-04 – 2018-03-07 (×4): 100 mg via ORAL
  Filled 2018-03-04 (×4): qty 1

## 2018-03-04 MED ORDER — INSULIN ASPART 100 UNIT/ML ~~LOC~~ SOLN
0.0000 [IU] | Freq: Every day | SUBCUTANEOUS | Status: DC
  Administered 2018-03-06: 3 [IU] via SUBCUTANEOUS
  Filled 2018-03-04: qty 1

## 2018-03-04 MED ORDER — POTASSIUM CHLORIDE CRYS ER 20 MEQ PO TBCR
40.0000 meq | EXTENDED_RELEASE_TABLET | Freq: Once | ORAL | Status: AC
  Administered 2018-03-04: 40 meq via ORAL
  Filled 2018-03-04: qty 2

## 2018-03-04 MED ORDER — SODIUM CHLORIDE 0.9 % IV SOLN
INTRAVENOUS | Status: DC
  Administered 2018-03-04: 10:00:00 via INTRAVENOUS

## 2018-03-04 MED ORDER — FUROSEMIDE 10 MG/ML IJ SOLN
20.0000 mg | Freq: Once | INTRAMUSCULAR | Status: AC
  Administered 2018-03-04: 20 mg via INTRAVENOUS
  Filled 2018-03-04: qty 4

## 2018-03-04 MED ORDER — INSULIN ASPART 100 UNIT/ML ~~LOC~~ SOLN
0.0000 [IU] | Freq: Three times a day (TID) | SUBCUTANEOUS | Status: DC
  Administered 2018-03-04 – 2018-03-05 (×4): 2 [IU] via SUBCUTANEOUS
  Administered 2018-03-06: 3 [IU] via SUBCUTANEOUS
  Administered 2018-03-06: 1 [IU] via SUBCUTANEOUS
  Administered 2018-03-06: 3 [IU] via SUBCUTANEOUS
  Administered 2018-03-07: 2 [IU] via SUBCUTANEOUS
  Filled 2018-03-04 (×9): qty 1

## 2018-03-04 MED ORDER — SENNA 8.6 MG PO TABS
1.0000 | ORAL_TABLET | Freq: Every day | ORAL | Status: DC | PRN
  Administered 2018-03-05: 8.6 mg via ORAL
  Filled 2018-03-04: qty 1

## 2018-03-04 NOTE — Progress Notes (Signed)
Dr. Anne Hahn notified of respiratory status, IVF, diminished breaths sounds; acknowledged; new orders written: stop IVF, CXR; will continue to monitor. Windy Carina, RN 10:19 PM; 03/04/2018

## 2018-03-04 NOTE — Progress Notes (Signed)
Respiratory consulted for O2 sats in the 70-80's; To be placed on 6L [place] and Respiratory therapist will come to see patient; frequent requests to patient to close mouth and deep breathe through her nose; Will continue to monitor. Windy Carina, RN 9:38 PM 03/04/2018

## 2018-03-04 NOTE — Progress Notes (Signed)
Called to pts room for decreased O2 Sats. Pt 89% on 6lpm nasal cannula. Pt placed on 55% ventimask. O2 sats increased to 92 %. rr 28. BBS diminished with faint rhonchi right upper lobe.  RN notified of assessment. Will continue to monitor pt.

## 2018-03-04 NOTE — Progress Notes (Signed)
[employer] - [place] at [facility]   PATIENT NAME: Kristina Archer    MR#:  [id number]  DATE OF BIRTH:  1945-08-28  SUBJECTIVE:  CHIEF COMPLAINT:   Chief Complaint  Patient presents with  . Flank Pain   Better right-sided flank pain and right-sided abdominal pain. REVIEW OF SYSTEMS:  Review of Systems  Constitutional: Negative for chills, fever and malaise/fatigue.  HENT: Negative for sore throat.   Eyes: Negative for blurred vision and double vision.  Respiratory: Negative for cough, hemoptysis, shortness of breath, wheezing and stridor.   Cardiovascular: Negative for chest pain, palpitations, orthopnea and leg swelling.  Gastrointestinal: Positive for abdominal pain. Negative for blood in stool, diarrhea, melena, nausea and vomiting.  Genitourinary: Positive for flank pain. Negative for dysuria, frequency, hematuria and urgency.  Musculoskeletal: Negative for back pain and joint pain.  Skin: Negative for rash.  Neurological: Negative for dizziness, sensory change, focal weakness, seizures, loss of consciousness, weakness and headaches.  Endo/Heme/Allergies: Negative for polydipsia.  Psychiatric/Behavioral: Negative for depression. The patient is not nervous/anxious.     DRUG ALLERGIES:   Allergies  Allergen Reactions  . Codeine Nausea Only   VITALS:  Blood pressure (!) 131/57, pulse 68, temperature (!) 97.5 F (36.4 C), temperature source Oral, resp. rate 18, height 5\' 2"  (1.575 m), weight 77.1 kg, SpO2 94 %. PHYSICAL EXAMINATION:  Physical Exam Constitutional:      General: She is not in acute distress.    Appearance: Normal appearance.  HENT:     Head: Normocephalic.     Mouth/Throat:     Mouth: Mucous membranes are moist.  Eyes:     General: No scleral icterus.    Conjunctiva/sclera: Conjunctivae normal.     Pupils: Pupils are equal, round, and reactive to light.  Neck:     Musculoskeletal: Normal range of motion and neck supple.   Vascular: No JVD.     Trachea: No tracheal deviation.  Cardiovascular:     Rate and Rhythm: Normal rate and regular rhythm.     Heart sounds: Normal heart sounds. No murmur. No gallop.   Pulmonary:     Effort: Pulmonary effort is normal. No respiratory distress.     Breath sounds: Normal breath sounds. No wheezing or rales.  Abdominal:     General: Bowel sounds are normal. There is no distension.     Palpations: Abdomen is soft.     Tenderness: There is abdominal tenderness. There is no rebound.  Musculoskeletal: Normal range of motion.        General: No tenderness.     Right lower leg: No edema.     Left lower leg: No edema.  Skin:    Findings: No erythema or rash.  Neurological:     General: No focal deficit present.     Mental Status: She is alert and oriented to person, place, and time.     Cranial Nerves: No cranial nerve deficit.  Psychiatric:        Mood and Affect: Mood normal.    LABORATORY PANEL:  Female CBC Recent Labs  Lab 03/03/18 0548  WBC 7.2  HGB 9.6*  HCT 29.9*  PLT 85*   ------------------------------------------------------------------------------------------------------------------ Chemistries  Recent Labs  Lab 03/03/18 0548 03/04/18 0407  NA 136 134*  K 3.2* 3.4*  CL 106 106  CO2 25 24  GLUCOSE 146* 180*  BUN 15 17  CREATININE 1.34* 1.61*  CALCIUM 9.1 8.9  MG 1.9  --  RADIOLOGY:  No results found. ASSESSMENT AND PLAN:   Ureterolithiasis -large right-sided obstructing renal stone.  S/p Cystoscopy, right retrograde pyelogram with intraoperative interpretation, right ureteral stent placement.  Pain control.  Start Flomax.  UTI.  Continue IV Zosyn, follow-up urine culture, discontinued Foley catheter, antibiotics 10 to 14 days per urologist. Add fluconazole for 7 days due to yeast in urine culture per urologist.  ARF.  Worsening renal function, start gentle IV fluid rehydration, follow-up BMP.    Chronic systolic CHF (congestive heart  failure) (HCC) -not in exacerbation at this time.  Last EF on echo per my chart review was 20-25%.    Hold Lasix.    PAF (paroxysmal atrial fibrillation) (HCC) -continue amiodarone and Coreg, resumed Eliquis.   HLD (hyperlipidemia) -home dose antilipid   Anxiety -home dose anxiolytic  Hypokalemia.  Potassium supplement.  All the records are reviewed and case discussed with Care Management/Social Worker. Management plans discussed with the patient, her son and they are in agreement.  CODE STATUS: Full Code  TOTAL TIME TAKING CARE OF THIS PATIENT: 27 minutes.   More than 50% of the time was spent in counseling/coordination of care: YES  POSSIBLE D/C IN 1-2 DAYS, DEPENDING ON CLINICAL CONDITION.   Shaune Pollack M.D on 03/04/2018 at 2:29 PM  Between 7am to 6pm - Pager - [phone]  After 6pm go to [email or web address] - Therapist, nutritional Hospitalists

## 2018-03-04 NOTE — Progress Notes (Signed)
Patient crying, stating that she's having a panic attack.  "I need my Paxil, this wouldn't be happening if you'd given me my Paxil!"  Informed patient that she was too lethargic earlier to get any medications; also informed of low O2 sats and place on ventimask and CXR; "I've never been like this before..my oxygen has never been that low..my son needs to check on me...Marland Kitchenhe needs to check on me more often". Given patient time to talk and calm down. Refused mask, now on 3 L [place]; Windy Carina, RN; 11:46 PM 03/04/2018

## 2018-03-04 NOTE — Progress Notes (Signed)
Told by 2 NTs that patient was complaining of pain; found patient in bed, when asked if she was hurting, she didn't answer, repeated X 3; patient appeared confused; Oriented to person, place and time,but couldn't remember why she was in the hospital; Denies pain and/or nausea at this time; couldn't keep eyes open; complained of being cold, warm blanket given; Face is symetrical when asked to smile. Will hold oxycodone and give Tylenol, until more alert. O2 @ 2lpm [place] applied for Oxy of 79 on RA. Windy Carina, RN; 9:25 PM 03/04/2018

## 2018-03-04 NOTE — Progress Notes (Signed)
Hospitalist paged for respiratory concerns. Awaiting call back. Windy Carina, RN

## 2018-03-05 DIAGNOSIS — E876 Hypokalemia: Secondary | ICD-10-CM | POA: Diagnosis present

## 2018-03-05 DIAGNOSIS — J9601 Acute respiratory failure with hypoxia: Secondary | ICD-10-CM | POA: Diagnosis present

## 2018-03-05 DIAGNOSIS — K219 Gastro-esophageal reflux disease without esophagitis: Secondary | ICD-10-CM | POA: Diagnosis present

## 2018-03-05 DIAGNOSIS — N132 Hydronephrosis with renal and ureteral calculous obstruction: Secondary | ICD-10-CM | POA: Diagnosis present

## 2018-03-05 DIAGNOSIS — I110 Hypertensive heart disease with heart failure: Secondary | ICD-10-CM | POA: Diagnosis present

## 2018-03-05 DIAGNOSIS — N200 Calculus of kidney: Secondary | ICD-10-CM | POA: Diagnosis not present

## 2018-03-05 DIAGNOSIS — N136 Pyonephrosis: Secondary | ICD-10-CM | POA: Diagnosis present

## 2018-03-05 DIAGNOSIS — F419 Anxiety disorder, unspecified: Secondary | ICD-10-CM | POA: Diagnosis present

## 2018-03-05 DIAGNOSIS — Z7901 Long term (current) use of anticoagulants: Secondary | ICD-10-CM | POA: Diagnosis not present

## 2018-03-05 DIAGNOSIS — N179 Acute kidney failure, unspecified: Secondary | ICD-10-CM | POA: Diagnosis present

## 2018-03-05 DIAGNOSIS — Z7984 Long term (current) use of oral hypoglycemic drugs: Secondary | ICD-10-CM | POA: Diagnosis not present

## 2018-03-05 DIAGNOSIS — Z9049 Acquired absence of other specified parts of digestive tract: Secondary | ICD-10-CM | POA: Diagnosis not present

## 2018-03-05 DIAGNOSIS — I480 Paroxysmal atrial fibrillation: Secondary | ICD-10-CM | POA: Diagnosis not present

## 2018-03-05 DIAGNOSIS — Z90710 Acquired absence of both cervix and uterus: Secondary | ICD-10-CM | POA: Diagnosis not present

## 2018-03-05 DIAGNOSIS — Z8249 Family history of ischemic heart disease and other diseases of the circulatory system: Secondary | ICD-10-CM | POA: Diagnosis not present

## 2018-03-05 DIAGNOSIS — Z87442 Personal history of urinary calculi: Secondary | ICD-10-CM | POA: Diagnosis not present

## 2018-03-05 DIAGNOSIS — N289 Disorder of kidney and ureter, unspecified: Secondary | ICD-10-CM | POA: Diagnosis not present

## 2018-03-05 DIAGNOSIS — Z960 Presence of urogenital implants: Secondary | ICD-10-CM | POA: Diagnosis present

## 2018-03-05 DIAGNOSIS — E119 Type 2 diabetes mellitus without complications: Secondary | ICD-10-CM | POA: Diagnosis present

## 2018-03-05 DIAGNOSIS — E785 Hyperlipidemia, unspecified: Secondary | ICD-10-CM | POA: Diagnosis present

## 2018-03-05 DIAGNOSIS — I255 Ischemic cardiomyopathy: Secondary | ICD-10-CM | POA: Diagnosis present

## 2018-03-05 DIAGNOSIS — I447 Left bundle-branch block, unspecified: Secondary | ICD-10-CM | POA: Diagnosis present

## 2018-03-05 DIAGNOSIS — I5023 Acute on chronic systolic (congestive) heart failure: Secondary | ICD-10-CM | POA: Diagnosis not present

## 2018-03-05 DIAGNOSIS — D649 Anemia, unspecified: Secondary | ICD-10-CM | POA: Diagnosis present

## 2018-03-05 DIAGNOSIS — Z885 Allergy status to narcotic agent status: Secondary | ICD-10-CM | POA: Diagnosis not present

## 2018-03-05 DIAGNOSIS — Z7902 Long term (current) use of antithrombotics/antiplatelets: Secondary | ICD-10-CM | POA: Diagnosis not present

## 2018-03-05 DIAGNOSIS — Z87891 Personal history of nicotine dependence: Secondary | ICD-10-CM | POA: Diagnosis not present

## 2018-03-05 LAB — CBC WITH DIFFERENTIAL/PLATELET
Abs Immature Granulocytes: 0.06 10*3/uL (ref 0.00–0.07)
Basophils Absolute: 0 10*3/uL (ref 0.0–0.1)
Basophils Relative: 0 %
Eosinophils Absolute: 0 10*3/uL (ref 0.0–0.5)
Eosinophils Relative: 0 %
HEMATOCRIT: 29.5 % — AB (ref 36.0–46.0)
Hemoglobin: 9.7 g/dL — ABNORMAL LOW (ref 12.0–15.0)
Immature Granulocytes: 1 %
LYMPHS ABS: 0.9 10*3/uL (ref 0.7–4.0)
Lymphocytes Relative: 11 %
MCH: 26.3 pg (ref 26.0–34.0)
MCHC: 32.9 g/dL (ref 30.0–36.0)
MCV: 79.9 fL — AB (ref 80.0–100.0)
Monocytes Absolute: 0.9 10*3/uL (ref 0.1–1.0)
Monocytes Relative: 11 %
Neutro Abs: 6.3 10*3/uL (ref 1.7–7.7)
Neutrophils Relative %: 77 %
Platelets: 123 10*3/uL — ABNORMAL LOW (ref 150–400)
RBC: 3.69 MIL/uL — ABNORMAL LOW (ref 3.87–5.11)
RDW: 15.3 % (ref 11.5–15.5)
WBC: 8.2 10*3/uL (ref 4.0–10.5)
nRBC: 0 % (ref 0.0–0.2)

## 2018-03-05 LAB — GLUCOSE, CAPILLARY
Glucose-Capillary: 199 mg/dL — ABNORMAL HIGH (ref 70–99)
Glucose-Capillary: 189 mg/dL — ABNORMAL HIGH (ref 70–99)
Glucose-Capillary: 151 mg/dL — ABNORMAL HIGH (ref 70–99)
Glucose-Capillary: 144 mg/dL — ABNORMAL HIGH (ref 70–99)

## 2018-03-05 LAB — BRAIN NATRIURETIC PEPTIDE: B Natriuretic Peptide: 2267 pg/mL — ABNORMAL HIGH (ref 0.0–100.0)

## 2018-03-05 LAB — BASIC METABOLIC PANEL
Anion gap: 10 (ref 5–15)
BUN: 19 mg/dL (ref 8–23)
CHLORIDE: 101 mmol/L (ref 98–111)
CO2: 24 mmol/L (ref 22–32)
CREATININE: 1.88 mg/dL — AB (ref 0.44–1.00)
Calcium: 9.8 mg/dL (ref 8.9–10.3)
GFR calc Af Amer: 31 mL/min — ABNORMAL LOW (ref >60)
GFR calc non Af Amer: 27 mL/min — ABNORMAL LOW (ref >60)
Glucose, Bld: 192 mg/dL — ABNORMAL HIGH (ref 70–99)
Potassium: 4.1 mmol/L (ref 3.5–5.1)
Sodium: 135 mmol/L (ref 135–145)

## 2018-03-05 MED ORDER — NITROFURANTOIN MONOHYD MACRO 100 MG PO CAPS
100.0000 mg | ORAL_CAPSULE | Freq: Every day | ORAL | Status: DC
  Administered 2018-03-05: 100 mg via ORAL
  Filled 2018-03-05 (×2): qty 1

## 2018-03-05 MED ORDER — IPRATROPIUM-ALBUTEROL 0.5-2.5 (3) MG/3ML IN SOLN
3.0000 mL | Freq: Four times a day (QID) | RESPIRATORY_TRACT | Status: DC
  Filled 2018-03-05 (×3): qty 3

## 2018-03-05 MED ORDER — GUAIFENESIN 100 MG/5ML PO SOLN
5.0000 mL | ORAL | Status: DC | PRN
  Filled 2018-03-05: qty 5

## 2018-03-05 MED ORDER — PAROXETINE HCL 20 MG PO TABS
20.0000 mg | ORAL_TABLET | Freq: Every day | ORAL | Status: DC
  Administered 2018-03-05 – 2018-03-07 (×3): 20 mg via ORAL
  Filled 2018-03-05 (×3): qty 1

## 2018-03-05 MED ORDER — POTASSIUM CHLORIDE CRYS ER 10 MEQ PO TBCR
10.0000 meq | EXTENDED_RELEASE_TABLET | Freq: Every day | ORAL | Status: DC
  Administered 2018-03-06: 10 meq via ORAL
  Filled 2018-03-05: qty 1

## 2018-03-05 MED ORDER — FUROSEMIDE 10 MG/ML IJ SOLN: 20.0000 mg | Freq: Two times a day (BID) | INTRAMUSCULAR | Status: DC

## 2018-03-05 MED ORDER — BISACODYL 5 MG PO TBEC: 10.0000 mg | DELAYED_RELEASE_TABLET | Freq: Every day | ORAL | Status: DC | PRN

## 2018-03-05 MED ORDER — SODIUM CHLORIDE 0.9 % IV SOLN
INTRAVENOUS | Status: DC | PRN
  Administered 2018-03-05: 14:00:00 via INTRAVENOUS

## 2018-03-05 MED ORDER — FUROSEMIDE 20 MG PO TABS
20.0000 mg | ORAL_TABLET | Freq: Every day | ORAL | Status: DC
  Administered 2018-03-06 – 2018-03-07 (×2): 20 mg via ORAL
  Filled 2018-03-05 (×2): qty 1

## 2018-03-05 MED ORDER — DOCUSATE SODIUM 100 MG PO CAPS
100.0000 mg | ORAL_CAPSULE | Freq: Two times a day (BID) | ORAL | Status: DC
  Administered 2018-03-05 – 2018-03-06 (×3): 100 mg via ORAL
  Filled 2018-03-05 (×3): qty 1

## 2018-03-05 NOTE — Progress Notes (Signed)
Patient waffling between being calm and cussing out NT staff; upset about being cold, despite turning heat up in room and giving multiple warm blankets; complaining about diffuse abdominal pain; O2 sats improved overnight on 3 L [place]; large urine output post Lasix administration. Windy Carina, RN 7:05 AM 03/05/2018

## 2018-03-05 NOTE — Progress Notes (Signed)
[employer] - [place] at [facility]   PATIENT NAME: Kristina Archer    MR#:  [id number]  DATE OF BIRTH:  [birth year]  SUBJECTIVE:  CHIEF COMPLAINT:   Chief Complaint  Patient presents with  . Flank Pain   The patient has shortness of breath and hypoxia, put on oxygen by nasal cannula 3 L since last night.she has better shortness of breath and she has no complaints of no right-sided flank pain and right-sided abdominal pain. REVIEW OF SYSTEMS:  Review of Systems  Constitutional: Negative for chills, fever and malaise/fatigue.  HENT: Negative for sore throat.   Eyes: Negative for blurred vision and double vision.  Respiratory: Positive for cough and shortness of breath. Negative for hemoptysis, sputum production, wheezing and stridor.   Cardiovascular: Negative for chest pain, palpitations, orthopnea and leg swelling.  Gastrointestinal: Negative for abdominal pain, blood in stool, diarrhea, melena, nausea and vomiting.  Genitourinary: Negative for dysuria, flank pain, frequency, hematuria and urgency.  Musculoskeletal: Negative for back pain and joint pain.  Skin: Negative for rash.  Neurological: Negative for dizziness, sensory change, focal weakness, seizures, loss of consciousness, weakness and headaches.  Endo/Heme/Allergies: Negative for polydipsia.  Psychiatric/Behavioral: Negative for depression. The patient is not nervous/anxious.     DRUG ALLERGIES:   Allergies  Allergen Reactions  . Codeine Nausea Only   VITALS:  Blood pressure 132/70, pulse 68, temperature 98.5 F (36.9 C), temperature source Oral, resp. rate 16, height 5\' 2"  (1.575 m), weight 77.1 kg, SpO2 96 %. PHYSICAL EXAMINATION:  Physical Exam Constitutional:      General: She is not in acute distress.    Appearance: Normal appearance.  HENT:     Head: Normocephalic.     Mouth/Throat:     Mouth: Mucous membranes are moist.  Eyes:     General: No scleral icterus.    Conjunctiva/sclera:  Conjunctivae normal.     Pupils: Pupils are equal, round, and reactive to light.  Neck:     Musculoskeletal: Normal range of motion and neck supple.     Vascular: No JVD.     Trachea: No tracheal deviation.  Cardiovascular:     Rate and Rhythm: Normal rate and regular rhythm.     Heart sounds: Normal heart sounds. No murmur. No gallop.   Pulmonary:     Effort: Pulmonary effort is normal. No respiratory distress.     Breath sounds: Rales present. No wheezing.  Abdominal:     General: Bowel sounds are normal. There is no distension.     Palpations: Abdomen is soft.     Tenderness: There is no abdominal tenderness. There is no rebound.  Musculoskeletal: Normal range of motion.        General: No tenderness.     Right lower leg: No edema.     Left lower leg: No edema.  Skin:    Findings: No erythema or rash.  Neurological:     General: No focal deficit present.     Mental Status: She is alert and oriented to person, place, and time.     Cranial Nerves: No cranial nerve deficit.  Psychiatric:        Mood and Affect: Mood normal.    LABORATORY PANEL:  Female CBC Recent Labs  Lab 03/05/18 0839  WBC 8.2  HGB 9.7*  HCT 29.5*  PLT 123*   ------------------------------------------------------------------------------------------------------------------ Chemistries  Recent Labs  Lab 03/03/18 0548  03/05/18 0552  NA 136   < > 135  K 3.2*   < > 4.1  CL 106   < > 101  CO2 25   < > 24  GLUCOSE 146*   < > 192*  BUN 15   < > 19  CREATININE 1.34*   < > 1.88*  CALCIUM 9.1   < > 9.8  MG 1.9  --   --    < > = values in this interval not displayed.   RADIOLOGY:  Dg Chest Port 1 View  Result Date: 03/04/2018 CLINICAL DATA:  Cough EXAM: PORTABLE CHEST 1 VIEW COMPARISON:  02/03/2018 FINDINGS: Cardiomegaly. Diffuse interstitial prominence throughout the lungs concerning for interstitial edema. No visible significant effusions or acute bony abnormality. IMPRESSION: Cardiomegaly with  diffuse interstitial prominence, likely interstitial edema. Electronically Signed   By: Charlett Nose M.D.   On: 03/04/2018 22:52   ASSESSMENT AND PLAN:   Ureterolithiasis -large right-sided obstructing renal stone.  S/p Cystoscopy, right retrograde pyelogram with intraoperative interpretation, right ureteral stent placement.  Pain control.  Start Flomax.  UTI.  Continue IV Zosyn, follow-up urine culture, discontinued Foley catheter, antibiotics 10 to 14 days per urologist. Added fluconazole for 7 days due to yeast in urine culture per urologist.  ARF.  Worsening renal function, started gentle IV fluid rehydration, which is discontinued since the patient developed acute respiratory failure with hypoxia due to pulmonary edema, follow-up BMP.   Acute respiratory failure with hypoxia due to Acute on chronic systolic CHF (congestive heart failure)   Last EF on echo per my chart review was 20-25%.    Chest x-ray shows pulmonary edema, BNP is elevated at 2267. Lasix was on hold due to renal failure. Continue oxygen by nasal cannula, DuoNeb as needed. She was given Lasix 1 dose last night due to hypoxia.  Lasix 20 mg p.o. daily. Cardiology consult.  CHF protocol.    PAF (paroxysmal atrial fibrillation) (HCC) -continue amiodarone and Coreg, resumed Eliquis.   HLD (hyperlipidemia) -home dose antilipid   Anxiety -home dose anxiolytic  Hypokalemia.  Improved with potassium supplement.  All the records are reviewed and case discussed with Care Management/Social Worker. Management plans discussed with the patient, her son and they are in agreement.  CODE STATUS: Full Code  TOTAL TIME TAKING CARE OF THIS PATIENT: 35 minutes.   More than 50% of the time was spent in counseling/coordination of care: YES  POSSIBLE D/C IN 2-3 DAYS, DEPENDING ON CLINICAL CONDITION.   Shaune Pollack M.D on 03/05/2018 at 12:45 PM  Between 7am to 6pm - Pager - [phone]  After 6pm go to [email or web address] - Programmer, systems Hospitalists

## 2018-03-05 NOTE — Consult Note (Addendum)
Cardiology Consultation:   Patient ID: Kristina Archer; [id number]; [birth year]   Admit date: 03/01/2018 Date of Consult: 03/05/2018  Primary Care Provider: Corky DownsMasoud, [name], MD Primary Cardiologist: Mariah MillingGollan   Patient Profile:   Kristina Archer is a 73 y.o. female with a hx of HFrEF with prior refusal of ischemic evaluation, recently diagnosed PAF in 10/2017 on Eliquis, SVT, LBBB, anemia, recurrent ureteral stone requiring prior cystoscopy and ureteral stenting, DM2, HTN, and HLD who is being seen today for the evaluation of acute on chronic HFrEF at the request of Dr. Imogene Burnhen.  History of Present Illness:   Kristina Archer did not have any previously known cardiac history prior to her 10/2017 admission.  [facility] prior though we do not have access to those records.  Patient was admitted to the hospital in 10/2017 with severe right flank pain and was found to have a 10 x 13 mm right UPJ stone on CT of the abdomen/pelvis.  The patient did not report chest pain though troponin was checked and found to be elevated initially at 0.09, ultimately peaking at 0.80.  Chest x-ray showed pulmonary edema.  EKG upon arrival showed SVT, 138 bpm.  She was given adenosine with conversion to sinus rhythm.  Echo on 11/03/2017 showed an EF of 20 to 25%, diffuse hypokinesis, mild mitral regurgitation, left atrium normal in size, RVSF normal, unable to estimate PASP.  Rhythm during echo was WCT with rate of 129 bpm.  She was IV diuresed.  She was managed with IV heparin for 48 hours with recommendation for right and left cardiac cath.  However, the patient preferred to defer cardiac cath during her admission and wanted to discuss this further with her primary care physician.  During her admission, she developed new onset A. fib with RVR with a baseline left bundle.  In this setting, she was started on amiodarone infusion and heparin drip was continued.  She was ultimately  transitioned to p.o. amiodarone and Eliquis.  She did not follow-up with cardiology as an outpatient.  She was readmitted to the hospital from 12/8 -12/13 for sepsis in the setting of UTI and pneumonia.  The patient was found laying down on her couch in a pool of liquid stool and continued right flank pain.  She was treated for pneumonia and UTI with broad-spectrum IV antibiotics per internal medicine.  She was recommended to follow-up with cardiology as an outpatient given her cardiomyopathy.  Troponin and EKG were not checked during the second admission.  She was seen by cardiology in late 12/2017 for preoperative risk stratification. She was noted to be in sinus rhythm at that time. Given her cardiomyopathy was felt to be tachy-mediated in 10/2017, she underwent repeat TTE on 01/09/2018 that showed persistently low EF of 20-25%, diffuse HK, Gr1DD, mild to moderate MR, trivial pericardial effusion.  Given she was completely asymptomatic from a cardiac perspective, no further cardiac workup was advised at that time given the need for her urological procedure with planned follow up ischemic testing when stable from a urology perspective.   The patient underwent cystoscopy and right-sided ureteral stent exchange in 01/2018.  Following this, she was admitted from 1/23-1/26 for sepsis of unclear etiology and volume overload requiring IV diuresis. Troponin was 0.06 and not cycled. EKG showed NSR with known LBBB.  She returned to the ED on 2/20 with right flank pain and was found to have an obstructing right ureteral stone.  Cardiology is asked to provide risk stratification. Vitals stable. EKG not done. SCr trending from 1/30--1.35 with a baseline ~ 0.8. Potassium 3.4. Cardiology preop evaluation for low risk for non-cardiac surgery with 0.9% risk of adverse event in perioperative period completed, and as further ischemic workup needed at that time, signed off Saturday 2/22 as patient previously denied ischemic workup.  It was advised she obtain outpatient ischemic evaluation following discharge.   Cardiology consulted again Monday 2/24 for chronic HFrEF without exacerbation.  Past Medical History:  Diagnosis Date  . Anxiety   . Cardiomyopathy (HCC)    a. 10/2017 Echo: EF 20-25%, diff HK, mild MR. Nl RV size.  . DDD (degenerative disc disease), lumbar   . Diverticulosis   . Elevated troponin   . Essential hypertension   . GERD (gastroesophageal reflux disease)   . History of kidney stones   . Hyperlipidemia   . LBBB (left bundle branch block)   . PAF (paroxysmal atrial fibrillation) (HCC)    a.  Diagnosed 10/2017; b.  On Eliquis and amiodarone; c. CHADS2VASc => 5 (CHF, HTN, age x 1, DM, female)  . Pneumonia   . Sciatica   . Type II diabetes mellitus (HCC)     Past Surgical History:  Procedure Laterality Date  . ABDOMINAL HYSTERECTOMY    . CHOLECYSTECTOMY    . CYSTOSCOPY W/ RETROGRADES Bilateral 05/24/2017   Procedure: CYSTOSCOPY WITH RETROGRADE PYELOGRAM;  Surgeon: Vanna Scotland, MD;  Location: [facility];  Service: Urology;  Laterality: Bilateral;  . CYSTOSCOPY W/ RETROGRADES Right 01/29/2018   Procedure: CYSTOSCOPY WITH RETROGRADE PYELOGRAM;  Surgeon: Vanna Scotland, MD;  Location: [facility];  Service: Urology;  Laterality: Right;  . CYSTOSCOPY W/ RETROGRADES Right 03/02/2018   Procedure: CYSTOSCOPY WITH RETROGRADE PYELOGRAM;  Surgeon: Sondra Come, MD;  Location: [facility];  Service: Urology;  Laterality: Right;  . CYSTOSCOPY W/ URETERAL STENT PLACEMENT Right 11/02/2017   Procedure: CYSTOSCOPY WITH RETROGRADE PYELOGRAM/URETERAL STENT PLACEMENT;  Surgeon: Crista Elliot, MD;  Location: [facility];  Service: Urology;  Laterality: Right;  . CYSTOSCOPY WITH STENT PLACEMENT Right 03/02/2018   Procedure: CYSTOSCOPY WITH STENT PLACEMENT-RIGHT;  Surgeon: Sondra Come, MD;  Location: [facility];  Service: Urology;  Laterality: Right;  . CYSTOSCOPY/URETEROSCOPY/HOLMIUM LASER/STENT PLACEMENT Left  05/24/2017   Procedure: CYSTOSCOPY/URETEROSCOPY/HOLMIUM LASER/STENT PLACEMENT;  Surgeon: Vanna Scotland, MD;  Location: [facility];  Service: Urology;  Laterality: Left;  . CYSTOSCOPY/URETEROSCOPY/HOLMIUM LASER/STENT PLACEMENT Right 01/29/2018   Procedure: CYSTOSCOPY/URETEROSCOPY/HOLMIUM LASER/STENT Exchange;  Surgeon: Vanna Scotland, MD;  Location: [facility];  Service: Urology;  Laterality: Right;     Home Meds: Prior to Admission medications   Medication Sig Start Date End Date Taking? Authorizing Provider  amiodarone (PACERONE) 200 MG tablet Take 1 tablet (200 mg total) by mouth daily. 01/09/18  Yes Dunn, Raymon Mutton, PA-C  apixaban (ELIQUIS) 5 MG TABS tablet Take 1 tablet (5 mg total) by mouth 2 (two) times daily. 11/13/17  Yes Katha Hamming, MD  carvedilol (COREG) 3.125 MG tablet Take 1 tablet (3.125 mg total) by mouth 2 (two) times daily with a meal. 11/13/17  Yes Katha Hamming, MD  docusate sodium (COLACE) 100 MG capsule Take 1 capsule (100 mg total) by mouth 2 (two) times daily. 01/29/18  Yes Vanna Scotland, MD  furosemide (LASIX) 20 MG tablet Take 20 mg by mouth daily.   Yes [provider]  HYDROcodone-acetaminophen (NORCO/VICODIN) 5-325 MG tablet Take 1-2 tablets by mouth every 6 (six) hours as needed for moderate pain. 01/29/18  Yes Vanna Scotland, MD  lisinopril (PRINIVIL,ZESTRIL) 5 MG tablet Take 1 tablet (5 mg total) by mouth daily. 01/11/18 04/11/18 Yes Dunn, Raymon Mutton, PA-C  metFORMIN (GLUCOPHAGE-XR) 500 MG 24 hr tablet Take 500 mg by mouth 2 (two) times daily.   Yes [provider]  Multiple Vitamin (MULTIVITAMIN WITH MINERALS) TABS tablet Take 1 tablet by mouth daily.   Yes [provider]  pantoprazole (PROTONIX) 40 MG tablet Take 1 tablet (40 mg total) by mouth daily before breakfast. 02/03/18  Yes Wieting, Richard, MD  PARoxetine (PAXIL) 20 MG tablet Take 20 mg by mouth at bedtime.   Yes [provider]  potassium chloride SA (K-DUR,KLOR-CON)  20 MEQ tablet Take 2 tablets (40 mEq total) by mouth at bedtime. 02/03/18  Yes Wieting, Richard, MD  rosuvastatin (CRESTOR) 40 MG tablet Take 1 tablet (40 mg total) by mouth daily. 11/13/17  Yes Katha Hamming, MD  sitaGLIPtin (JANUVIA) 100 MG tablet Take 100 mg by mouth daily.   Yes [provider]  traZODone (DESYREL) 50 MG tablet Take 1 tablet by mouth at bedtime. 11/30/17  Yes [provider]  feeding supplement, GLUCERNA SHAKE, (GLUCERNA SHAKE) LIQD Take 237 mLs by mouth 2 (two) times daily between meals. 12/22/17   Ramonita Lab, MD    Inpatient Medications: Scheduled Meds: . amiodarone  200 mg Oral Daily  . apixaban  5 mg Oral BID  . carvedilol  3.125 mg Oral BID WC  . fluconazole  100 mg Oral Daily  . furosemide  20 mg Intravenous Q12H  . insulin aspart  0-5 Units Subcutaneous QHS  . insulin aspart  0-9 Units Subcutaneous TID WC  . pantoprazole  40 mg Oral QAC breakfast  . PARoxetine  20 mg Oral QHS  . rosuvastatin  40 mg Oral q1800  . tamsulosin  0.4 mg Oral Daily  . traZODone  50 mg Oral QHS   Continuous Infusions: . sodium chloride Stopped (03/03/18 1056)  . piperacillin-tazobactam (ZOSYN)  IV 3.375 g (03/05/18 0559)   PRN Meds: sodium chloride, acetaminophen **OR** acetaminophen, bisacodyl, ondansetron **OR** ondansetron (ZOFRAN) IV, oxyCODONE, senna, sodium chloride  Allergies:   Allergies  Allergen Reactions  . Codeine Nausea Only    Social History:   Social History   Socioeconomic History  . Marital status: Widowed    Spouse name: Not on file  . Number of children: Not on file  . Years of education: Not on file  . Highest education level: Not on file  Occupational History  . Not on file  Social Needs  . Financial resource strain: Patient refused  . Food insecurity:    Worry: Patient refused    Inability: Patient refused  . Transportation needs:    Medical: Patient refused    Non-medical: Patient refused  Tobacco Use  . Smoking  status: Former Smoker    Packs/day: 1.50    Years: 15.00    Pack years: 22.50    Types: Cigarettes    Last attempt to quit: 05/12/1997    Years since quitting: 20.8  . Smokeless tobacco: Never Used  Substance and Sexual Activity  . Alcohol use: Not Currently  . Drug use: Never  . Sexual activity: Not Currently  Lifestyle  . Physical activity:    Days per week: Patient refused    Minutes per session: Patient refused  . Stress: Patient refused  Relationships  . Social connections:    Talks on phone: Patient refused    Gets together: Patient refused  Attends religious service: Patient refused    Active member of club or organization: Patient refused    Attends meetings of clubs or organizations: Patient refused    Relationship status: Patient refused  . Intimate partner violence:    Fear of current or ex partner: Patient refused    Emotionally abused: Patient refused    Physically abused: Patient refused    Forced sexual activity: Patient refused  Other Topics Concern  . Not on file  Social History Narrative  . Not on file     Family History:   Family History  Problem Relation Age of Onset  . Heart attack Father 67  . Bladder Cancer Neg Hx   . Kidney cancer Neg Hx     ROS:  Review of Systems  Constitutional: Positive for malaise/fatigue. Negative for chills, diaphoresis, fever and weight loss.  HENT: Negative for congestion.   Eyes: Negative for discharge and redness.  Respiratory: Positive for shortness of breath. Negative for cough, hemoptysis, sputum production and wheezing.        Improving on [place]  Cardiovascular: Negative for chest pain, palpitations, orthopnea, claudication, leg swelling and PND.  Gastrointestinal: Positive for abdominal pain. Negative for blood in stool, heartburn, melena, nausea and vomiting.  Genitourinary: Positive for flank pain. Negative for hematuria.  Musculoskeletal: Positive for myalgias. Negative for falls.  Skin: Negative for rash.   Neurological: Positive for weakness. Negative for dizziness, tingling, tremors, sensory change, speech change, focal weakness and loss of consciousness.  Endo/Heme/Allergies: Does not bruise/bleed easily.  Psychiatric/Behavioral: Negative for substance abuse. The patient is not nervous/anxious.        Tearful  All other systems reviewed and are negative.     Physical Exam/Data:   Vitals:   03/05/18 0000 03/05/18 0149 03/05/18 0438 03/05/18 0550  BP:    (!) 145/67  Pulse:  69 72 71  Resp:    20  Temp:    98.3 F (36.8 C)  TempSrc:    Oral  SpO2: 90% 95% 94% 93%  Weight:      Height:        Intake/Output Summary (Last 24 hours) at 03/05/2018 0819 Last data filed at 03/05/2018 0550 Gross per 24 hour  Intake 644.42 ml  Output 1450 ml  Net -805.58 ml   Filed Weights   03/01/18 1913 03/02/18 1058  Weight: 77.1 kg 77.1 kg   Body mass index is 31.09 kg/m.   Physical Exam: General: Well developed, well nourished. Tearful and stated "does not want to see anymore doctors today." Head: Normocephalic, atraumatic, sclera non-icteric, no xanthomas. Nasal cannula o2 Neck: Negative for carotid bruits. Elevated JVP. No JVD Lungs: On nasal cannula. Breathing is slightly labored while crying and reduced breath sounds with slight bibasilar crackles. Slight rhonchi heard on exam in setting of active crying. Heart: RRR with S1 S2. No murmurs, rubs, or gallops appreciated. Abdomen: Soft, non-tender, non-distended with normoactive bowel sounds. No hepatomegaly. No rebound/guarding. No obvious abdominal masses. Msk:  Strength and tone appear normal for age. Extremities: No clubbing or cyanosis. No bilateral LE edema. Distal pedal pulses are 2+ and equal bilaterally. Neuro: Alert and oriented X 3. No facial asymmetry. No focal deficit. Moves all extremities spontaneously. Psych:  Responds to questions appropriately but tearful   EKG:  The EKG was personally reviewed and demonstrates: No new  tracings since 2/23 with SR, LBBB Telemetry:  Telemetry was personally reviewed and demonstrates: not on telemetry - recommend start on telemetry  Weights:  Filed Weights   03/01/18 1913 03/02/18 1058  Weight: 77.1 kg 77.1 kg    Relevant CV Studies: TTE 10/2017: Study Conclusions  - Left ventricle: The cavity size was mildly dilated. Systolic   function was severely reduced. The estimated ejection fraction   was in the range of 20% to 25%. Diffuse hypokinesis. Regional   wall motion abnormalities cannot be excluded. The study is not   technically sufficient to allow evaluation of LV diastolic   function. - Mitral valve: There was mild regurgitation. - Left atrium: The atrium was normal in size. - Right ventricle: Systolic function was normal. - Pulmonary arteries: Systolic pressure could not be accurately   estimated.  Impressions:  - Rhythm is wide complex tachycardia rate 129 bpm.   TTE 12/2017: Study Conclusions - Limited study for evaluation of left ventricular systolic   function. - Left ventricle: Systolic function was severely reduced. The   estimated ejection fraction was in the range of 20% to 25%.   Diffuse hypokinesis. Regional wall motion abnormalities cannot be   excluded. Doppler parameters are consistent with abnormal left   ventricular relaxation (grade 1 diastolic dysfunction). Doppler   parameters are consistent with high ventricular filling pressure. - Mitral valve: Calcified annulus. There was mild to moderate   regurgitation. - Pericardium, extracardiac: A trivial pericardial effusion was   identified.  Laboratory Data:  Chemistry Recent Labs  Lab 03/03/18 0548 03/04/18 0407 03/05/18 0552  NA 136 134* 135  K 3.2* 3.4* 4.1  CL 106 106 101  CO2 GLUCOSE 146* 180* 192*  BUN CREATININE 1.34* 1.61* 1.88*  CALCIUM 9.1 8.9 9.8  GFRNONAA 40* 32* 27*  GFRAA 46* 37* 31*  ANIONGAP 5 4* 10    No results for input(s): PROT,  ALBUMIN, AST, ALT, ALKPHOS, BILITOT in the last 168 hours. Hematology Recent Labs  Lab 03/01/18 1924 03/02/18 0646 03/03/18 0548  WBC 9.4 9.2 7.2  RBC 4.75 3.99 3.67*  HGB 12.3 10.3* 9.6*  HCT 37.7 32.4* 29.9*  MCV 79.4* 81.2 81.5  MCH 25.9* 25.8* 26.2  MCHC 32.6 31.8 32.1  RDW 15.2 15.5 15.4  PLT 123* 87* 85*   Cardiac EnzymesNo results for input(s): TROPONINI in the last 168 hours. No results for input(s): TROPIPOC in the last 168 hours.  BNPNo results for input(s): BNP, PROBNP in the last 168 hours.  DDimer No results for input(s): DDIMER in the last 168 hours.  Radiology/Studies:  Ct Renal Stone Study  Result Date: 03/01/2018 IMPRESSION: 1. 5 x 5 x 12 mm obstructing stone at the right ureteropelvic junction with right-sided pelvicaliectasis and perinephric stranding. 2. 5 mm nonobstructing stone in lower pole of left kidney. 3. Sigmoid diverticulosis, no findings of acute diverticulitis. Electronically Signed   By: Mitzi Hansen M.D.   On: 03/01/2018 21:27    Assessment and Plan:   Acute on Chronic HFrEF: -SOB but stated the [place] oxygen has assisted in alleviating this for her.Marland Kitchen BNP ordered and pending with last known BNP 10/2017 and 862 to better assess fluid status in setting of low EF with elevated Cr. - Update, BNP elevated at 2267.0. CBC ordered and no elevated WBC but most recent CXR shows interstitial edema and SOB reported. Likely pulmonary congestion in setting of fluids administered over the weekend with known low EF. - Daily weights needed to assist with volume status given pulmonary congestion. No LEE on exam and last known documented weight 2/21 and down  4 kg from visit on 01/09/2018 with elevated Cr but CXR with pulmonary congestion. - Discontinued IV fluids given her low EF 20-25% as fluids will likely result in further pulmonary congestion. Recent echo 12/2017 with trivial pericardial effusion. No s/sx of tamponade. Recommend low threshold to repeat echo  if patient s/sx suggest worsening effusion.  - Discontinued IV lasix, given elevated Cr / AKI with recommendation to restart oral PTA low dose lasix given elevated BNP and congestion s/p fluids. Will restart KCl with diuresis. Close monitoring of renal function. - Daily BMET recommended. -Continue Coreg -Escalation of evidence based HF therapy including ACEi/ARB/spiro/ARNI currently still held d/t AKI in the setting of recurrent renal stones and UTI. PTA lisinopril thus on hold as Cr still elevated and rising as below. Recommend continue to hold in setting of AKI. Monitor SBP with recommendations as below. Recommend ACE restart prior to discharge or as outpatient with recovery of renal function.  AKI - Cr continues to rise despite IVF administered over the weekend and now SOB with CXR showing pulmonary congestion. Recommend that discontinue IVF and consider po lasix given low EF and to prevent further volume overload. Baseline Cr 0.85. Current Cr. 1.88. Consider elevated Cr d/t volume overload given rise in BP as well. Caution with contrast procedures / nephrotoxins. Renal dosage of medications as needed.  PAF/SVT: -Not on telemetry. Recommend start back on telemetry with cardiology consulted. EKG rechecked 1/23 to confirm maintaining sinus rhythm (known LBBB) -Rates relatively controlled but somewhat elevated in setting of pain and agitation.  -Continue amiodarone  daily and BB. Monitor.  -Eliquis  BID restarted s/p procedure with CHADS2VASc of at least 6 (CHF, HTN, age x 1, DM, vascular disease, female). Continue as does not meet 2 of 3 criteria needed to reduce dosage despite elevated Cr given weight over 60kg and age less than 80 years.   History of elevated troponin: -Noted in 10/2017 and again in 01/2018 -Previously, she has refused ischemic evaluation -Continues to deny chest pain. SOB reported. -Will need outpatient ischemic evaluation   Anemia: -Stable. Recommend transfusion  below 8 -Monitor  HTN: -BP elevated with SBP high 140s. Monitor - recommend frequent checks.  - Monitor BP, likely elevated in setting of volume overload. Could consider escalation of BB from Coreg 3.215mg  (home dose) to 6.25mg  BID but recommend avoid hypotension, which could worsen renal function. Monitor for now to see if SBP decrease with decrease in volume overload. No CCB recommended given severely reduced EF. ACE held d/t renal function and likely elevated BP in setting of this and current pain   For questions or updates, please contact [facility] Please consult [email or web address] for contact info under Cardiology/STEMI.   Signed, Lennon Alstrom, PA-C 03/05/2018, 9:32 AM Pager [phone]

## 2018-03-05 NOTE — Progress Notes (Signed)
Consult for IV placement. Patient refusing at this time. She was tearful and upset. I informed primary nurse, instructed to replace order when patient agreeable.

## 2018-03-06 DIAGNOSIS — I480 Paroxysmal atrial fibrillation: Secondary | ICD-10-CM

## 2018-03-06 DIAGNOSIS — N289 Disorder of kidney and ureter, unspecified: Secondary | ICD-10-CM

## 2018-03-06 LAB — GLUCOSE, CAPILLARY
Glucose-Capillary: 141 mg/dL — ABNORMAL HIGH (ref 70–99)
Glucose-Capillary: 203 mg/dL — ABNORMAL HIGH (ref 70–99)
Glucose-Capillary: 207 mg/dL — ABNORMAL HIGH (ref 70–99)
Glucose-Capillary: 286 mg/dL — ABNORMAL HIGH (ref 70–99)

## 2018-03-06 LAB — CBC WITH DIFFERENTIAL/PLATELET
Abs Immature Granulocytes: 0.03 10*3/uL (ref 0.00–0.07)
BASOS PCT: 0 %
Basophils Absolute: 0 10*3/uL (ref 0.0–0.1)
Eosinophils Absolute: 0 10*3/uL (ref 0.0–0.5)
Eosinophils Relative: 0 %
HCT: 28.2 % — ABNORMAL LOW (ref 36.0–46.0)
Hemoglobin: 9.1 g/dL — ABNORMAL LOW (ref 12.0–15.0)
Immature Granulocytes: 1 %
Lymphocytes Relative: 14 %
Lymphs Abs: 0.9 10*3/uL (ref 0.7–4.0)
MCH: 25.9 pg — ABNORMAL LOW (ref 26.0–34.0)
MCHC: 32.3 g/dL (ref 30.0–36.0)
MCV: 80.1 fL (ref 80.0–100.0)
Monocytes Absolute: 0.6 10*3/uL (ref 0.1–1.0)
Monocytes Relative: 10 %
NRBC: 0 % (ref 0.0–0.2)
Neutro Abs: 4.8 10*3/uL (ref 1.7–7.7)
Neutrophils Relative %: 75 %
PLATELETS: 128 10*3/uL — AB (ref 150–400)
RBC: 3.52 MIL/uL — ABNORMAL LOW (ref 3.87–5.11)
RDW: 15.3 % (ref 11.5–15.5)
WBC: 6.4 10*3/uL (ref 4.0–10.5)

## 2018-03-06 LAB — BASIC METABOLIC PANEL
ANION GAP: 5 (ref 5–15)
BUN: 19 mg/dL (ref 8–23)
CO2: 26 mmol/L (ref 22–32)
Calcium: 9.4 mg/dL (ref 8.9–10.3)
Chloride: 104 mmol/L (ref 98–111)
Creatinine, Ser: 1.89 mg/dL — ABNORMAL HIGH (ref 0.44–1.00)
GFR calc Af Amer: 31 mL/min — ABNORMAL LOW (ref >60)
GFR calc non Af Amer: 26 mL/min — ABNORMAL LOW (ref >60)
Glucose, Bld: 118 mg/dL — ABNORMAL HIGH (ref 70–99)
Potassium: 3.3 mmol/L — ABNORMAL LOW (ref 3.5–5.1)
Sodium: 135 mmol/L (ref 135–145)

## 2018-03-06 LAB — MAGNESIUM: Magnesium: 2 mg/dL (ref 1.7–2.4)

## 2018-03-06 MED ORDER — IPRATROPIUM-ALBUTEROL 0.5-2.5 (3) MG/3ML IN SOLN: 3.0000 mL | RESPIRATORY_TRACT | Status: DC | PRN

## 2018-03-06 MED ORDER — POTASSIUM CHLORIDE CRYS ER 20 MEQ PO TBCR
40.0000 meq | EXTENDED_RELEASE_TABLET | Freq: Once | ORAL | Status: AC
  Administered 2018-03-06: 40 meq via ORAL
  Filled 2018-03-06: qty 2

## 2018-03-06 MED ORDER — POTASSIUM CHLORIDE CRYS ER 20 MEQ PO TBCR
20.0000 meq | EXTENDED_RELEASE_TABLET | Freq: Two times a day (BID) | ORAL | Status: DC
  Administered 2018-03-07: 20 meq via ORAL
  Filled 2018-03-06: qty 1

## 2018-03-06 NOTE — Progress Notes (Addendum)
Pt ambulated today 1 lap around nursing station  with staff with slight toleration.

## 2018-03-06 NOTE — Progress Notes (Signed)
[employer] - [place] [facility]   PATIENT NAME: Kristina Archer    MR#:  [id number]  DATE OF BIRTH:  Jun 02, 1945  SUBJECTIVE:  CHIEF COMPLAINT:   Chief Complaint  Patient presents with  . Flank Pain   The patient has better shortness of breath and cough, on oxygen by nasal cannula 2 L, has abdominal pain. REVIEW OF SYSTEMS:  Review of Systems  Constitutional: Positive for malaise/fatigue. Negative for chills and fever.  HENT: Negative for sore throat.   Eyes: Negative for blurred vision and double vision.  Respiratory: Positive for cough and shortness of breath. Negative for hemoptysis, sputum production, wheezing and stridor.   Cardiovascular: Negative for chest pain, palpitations, orthopnea and leg swelling.  Gastrointestinal: Positive for abdominal pain. Negative for blood in stool, diarrhea, melena, nausea and vomiting.  Genitourinary: Negative for dysuria, flank pain, frequency, hematuria and urgency.  Musculoskeletal: Negative for back pain and joint pain.  Skin: Negative for rash.  Neurological: Negative for dizziness, sensory change, focal weakness, seizures, loss of consciousness, weakness and headaches.  Endo/Heme/Allergies: Negative for polydipsia.  Psychiatric/Behavioral: Negative for depression. The patient is nervous/anxious.     DRUG ALLERGIES:   Allergies  Allergen Reactions  . Codeine Nausea Only   VITALS:  Blood pressure (!) 129/52, pulse 65, temperature 98.2 F (36.8 C), temperature source Oral, resp. rate 16, height 5\' 2"  (1.575 m), weight 80.6 kg, SpO2 96 %. PHYSICAL EXAMINATION:  Physical Exam Constitutional:      General: She is not in acute distress.    Appearance: Normal appearance.  HENT:     Head: Normocephalic.     Mouth/Throat:     Mouth: Mucous membranes are moist.  Eyes:     General: No scleral icterus.    Conjunctiva/sclera: Conjunctivae normal.     Pupils: Pupils are equal, round, and reactive to light.  Neck:       Musculoskeletal: Normal range of motion and neck supple.     Vascular: No JVD.     Trachea: No tracheal deviation.  Cardiovascular:     Rate and Rhythm: Normal rate and regular rhythm.     Heart sounds: Normal heart sounds. No murmur. No gallop.   Pulmonary:     Effort: Pulmonary effort is normal. No respiratory distress.     Breath sounds: Rales present. No wheezing.  Abdominal:     General: Bowel sounds are normal. There is no distension.     Palpations: Abdomen is soft.     Tenderness: There is abdominal tenderness. There is no rebound.  Musculoskeletal: Normal range of motion.        General: No tenderness.     Right lower leg: No edema.     Left lower leg: No edema.  Skin:    Findings: No erythema or rash.  Neurological:     General: No focal deficit present.     Mental Status: She is alert and oriented to person, place, and time.     Cranial Nerves: No cranial nerve deficit.  Psychiatric:        Mood and Affect: Mood normal.    LABORATORY PANEL:  Female CBC Recent Labs  Lab 03/06/18 0412  WBC 6.4  HGB 9.1*  HCT 28.2*  PLT 128*   ------------------------------------------------------------------------------------------------------------------ Chemistries  Recent Labs  Lab 03/06/18 0412  NA 135  K 3.3*  CL 104  CO2 26  GLUCOSE 118*  BUN 19  CREATININE 1.89*  CALCIUM 9.4  MG 2.0  RADIOLOGY:  No results found. ASSESSMENT AND PLAN:   Ureterolithiasis -large right-sided obstructing renal stone.  S/p Cystoscopy, right retrograde pyelogram with intraoperative interpretation, right ureteral stent placement.  Pain control.  Start Flomax.  UTI.  She was treated with IV Zosyn, follow-up urine culture: yeast, discontinued Foley catheter, antibiotics 10 to 14 days per urologist. Added fluconazole for 7 days due to yeast in urine culture per urologist.  ARF.  Worsening renal function, started gentle IV fluid rehydration, which is discontinued since the  patient developed acute respiratory failure with hypoxia due to pulmonary edema, follow-up BMP.   Acute respiratory failure with hypoxia due to Acute on chronic systolic CHF (congestive heart failure)   Last EF on echo per my chart review was 20-25%.    Chest x-ray shows pulmonary edema, BNP is elevated at 2267. Lasix was on hold due to renal failure. Continue oxygen by nasal cannula, DuoNeb as needed. She was given Lasix 1 doset due to hypoxia.  Lasix 20 mg p.o. daily. CHF protocol.    PAF (paroxysmal atrial fibrillation) (HCC) -continue amiodarone and Coreg, resumed Eliquis.   HLD (hyperlipidemia) -home dose antilipid   Anxiety -home dose anxiolytic  Hypokalemia. potassium supplement.  All the records are reviewed and case discussed with Care Management/Social Worker. Management plans discussed with the patient, her son and they are in agreement.  CODE STATUS: Full Code  TOTAL TIME TAKING CARE OF THIS PATIENT: 28 minutes.   More than 50% of the time was spent in counseling/coordination of care: YES  POSSIBLE D/C IN 2 DAYS, DEPENDING ON CLINICAL CONDITION.   Shaune Pollack M.D on 03/06/2018 at 1:47 PM  Between 7am to 6pm - Pager - [phone]  After 6pm go to [email or web address] - Therapist, [facility] Hospitalists

## 2018-03-06 NOTE — Care Management (Signed)
Patient admitted from home with Ureterolithiasis . Patient also found to be in Acute respiratory failure with hypoxia due toAcute on chronic systolic CHF.  Patient lives at home alone.  PCP [facility]. [facility].  Patient has Rw and shower seat in the home.  Patient denies issues with transportation or obtaining medications.   Patient open with [facility] home health for RN, PT, and OT.  [place] with [facility] aware of admission.  RNCM consult for "heart failure protocol".  If heart failure protocol indicated by MD, [facility] add their heart failure protocol in the home.   RNCM following

## 2018-03-06 NOTE — Progress Notes (Addendum)
Progress Note  Patient Name: Kristina Archer Date of Encounter: 03/06/2018  Primary Cardiologist: Julien Nordmann, MD   Subjective   Patient denies CP, palpitations, or feeling of racing HR. She reportedly ambulated today without any symptoms of syncope or pre-syncope but did feel SOB. She has weaned off oxygen at rest and stated she is working on weaning off of oxygen with ambulation as she does not use oxygen at home. She reports improved abdominal pain s/p procedure. Her main complaint is of urinating in the bed but received help from nursing staff while I was in the room to alleviate this complaint.   Inpatient Medications    Scheduled Meds: . amiodarone  200 mg Oral Daily  . apixaban  5 mg Oral BID  . carvedilol  3.125 mg Oral BID WC  . docusate sodium  100 mg Oral BID  . fluconazole  100 mg Oral Daily  . furosemide  20 mg Oral Daily  . insulin aspart  0-5 Units Subcutaneous QHS  . insulin aspart  0-9 Units Subcutaneous TID WC  . nitrofurantoin (macrocrystal-monohydrate)  100 mg Oral QHS  . pantoprazole  40 mg Oral QAC breakfast  . PARoxetine  20 mg Oral Daily  . potassium chloride  10 mEq Oral Daily  . rosuvastatin  40 mg Oral q1800  . tamsulosin  0.4 mg Oral Daily  . traZODone  50 mg Oral QHS   Continuous Infusions: . sodium chloride Stopped (03/05/18 1338)   PRN Meds: sodium chloride, acetaminophen **OR** acetaminophen, bisacodyl, guaiFENesin, ipratropium-albuterol, ondansetron **OR** ondansetron (ZOFRAN) IV, oxyCODONE, senna, sodium chloride   Vital Signs    Vitals:   03/05/18 1139 03/05/18 2027 03/06/18 0643 03/06/18 0705  BP: 132/70 (!) 113/58  (!) 134/58  Pulse: 68 66  72  Resp: 16 18  20   Temp: 98.5 F (36.9 C) 98.1 F (36.7 C) 98.2 F (36.8 C) 98.2 F (36.8 C)  TempSrc: Oral Oral Oral Oral  SpO2: 96% 95%  99%  Weight:    80.6 kg  Height:        Intake/Output Summary (Last 24 hours) at 03/06/2018 1009 Last data filed at 03/05/2018 1658 Gross per 24  hour  Intake 71.25 ml  Output -  Net 71.25 ml   Filed Weights   03/01/18 1913 03/02/18 1058 03/06/18 0705  Weight: 77.1 kg 77.1 kg 80.6 kg    Telemetry    Started on telemetry yesterday with rates in 60s, ectopy (occasional), LBBB (known)- Personally Reviewed  ECG    No new tracings- Personally Reviewed  Physical Exam   GEN: NAD once bed changed as above. Off of [place]  Neck: Elevated JVP Cardiac: RRR, no murmurs, rubs, or gallops.  Respiratory: Clear to auscultation bilaterally. GI: Soft, nontender, non-distended  MS: No bilateral lower extremity edema; No deformity. Neuro:  Nonfocal  Psych: Normal affect   Labs    Chemistry Recent Labs  Lab 03/04/18 0407 03/05/18 0552 03/06/18 0412  NA 134* 135 135  K 3.4* 4.1 3.3*  CL 106 101 104  CO2 24 24 26   GLUCOSE 180* 192* 118*  BUN 17 19 19   CREATININE 1.61* 1.88* 1.89*  CALCIUM 8.9 9.8 9.4  GFRNONAA 32* 27* 26*  GFRAA 37* 31* 31*  ANIONGAP 4* 10 5     Hematology Recent Labs  Lab 03/03/18 0548 03/05/18 0839 03/06/18 0412  WBC 7.2 8.2 6.4  RBC 3.67* 3.69* 3.52*  HGB 9.6* 9.7* 9.1*  HCT 29.9* 29.5* 28.2*  MCV  81.5 79.9* 80.1  MCH 26.2 26.3 25.9*  MCHC 32.1 32.9 32.3  RDW 15.4 15.3 15.3  PLT 85* 123* 128*    Cardiac EnzymesNo results for input(s): TROPONINI in the last 168 hours. No results for input(s): TROPIPOC in the last 168 hours.   BNP Recent Labs  Lab 03/05/18 0839  BNP 2,267.0*     DDimer No results for input(s): DDIMER in the last 168 hours.   Radiology    Dg Chest Port 1 View  Result Date: 03/04/2018 CLINICAL DATA:  Cough EXAM: PORTABLE CHEST 1 VIEW COMPARISON:  02/03/2018 FINDINGS: Cardiomegaly. Diffuse interstitial prominence throughout the lungs concerning for interstitial edema. No visible significant effusions or acute bony abnormality. IMPRESSION: Cardiomegaly with diffuse interstitial prominence, likely interstitial edema. Electronically Signed   By: Charlett NoseKevin  Dover M.D.   On:  03/04/2018 22:52    Cardiac Studies   TTE 10/2017: - Left ventricle: The cavity size was mildly dilated. Systolic function was severely reduced. The estimated ejection fraction was in the range of 20% to 25%. Diffuse hypokinesis. Regional wall motion abnormalities cannot be excluded. The study is not technically sufficient to allow evaluation of LV diastolic function. - Mitral valve: There was mild regurgitation. - Left atrium: The atrium was normal in size. - Right ventricle: Systolic function was normal. - Pulmonary arteries: Systolic pressure could not be accurately estimated. Impressions: - Rhythm is wide complex tachycardia rate 129 bpm.   TTE 12/2017: - Limited study for evaluation of left ventricular systolic function. - Left ventricle: Systolic function was severely reduced. The estimated ejection fraction was in the range of 20% to 25%. Diffuse hypokinesis. Regional wall motion abnormalities cannot be excluded. Doppler parameters are consistent with abnormal left ventricular relaxation (grade 1 diastolic dysfunction). Doppler parameters are consistent with high ventricular filling pressure. - Mitral valve: Calcified annulus. There was mild to moderate regurgitation. - Pericardium, extracardiac: A trivial pericardial effusion was identified.  Patient Profile     73 y.o. female with a hx of HFrEF with prior refusal of ischemic evaluation, recently diagnosed PAF in 10/2017 on Eliquis, SVT, LBBB, anemia, recurrent ureteral stone requiring prior cystoscopy and ureteral stenting, DM2, HTN, and HLD who is being seen today for the evaluation of acute on chronic HFrEF at the request of Dr. Imogene Burnhen.  Assessment & Plan    Acute on Chronic HFrEF: -SOB, hypoxia s/p fluids administered after ureteral stent placement procedure. BNP elevated at 2267.0. Echo as above with low EF. Discontinued IV fluids 2/24 given these combined with her low EF 20-25% as  likely etiology of pulmonary edema on CXR.  - Avoid fluids as SOB improving since administered IV lasix x1 and then oral diuresis after IVF. Fluids restarted yesterday, which I have discontinued again today as no current IV medications that need IVF piggyback.  - Daily weights - continue to document. Weight increased with IVF 77.1kg (2/21)   80.6kg (2/25). Daily BMET. Close monitoring of renal function with AKI as below.  Continue Coreg. Escalation of evidence based HF therapy including ACEi/ARB/spiro/ARNI currently still held d/t AKI in the setting of recurrent renal stones/volume overload. Continue to hold PTA lisinopril. Monitor SBP as below. ACE restart prior to discharge as renal function allows or as outpatient. Will recheck BNP as diuresis has been limited by renal function. Recommend ambulation with oxygen saturations documented as tolerated.   Hypokalemia - K 4.1   3.3. Replete with goal 4.0. Will increase potassium supplementation to home dose. Mg at goal 2.0. Daily BMET.  AKI -Baseline Cr 0.85. Cr. 1.88  1.89. Continue to monitor. Caution with nephrotoxins. Renal dosage of medications as needed.  PAF/SVT: -EKG rechecked 1/23 to confirm maintaining sinus rhythm (known LBBB) -Continue amiodarone 200mg  daily and BB as above. Eliquis 5mg  BID. CHADS2VASc of at least 6 (CHF, HTN, age x 1, DM, vascular disease, female). No dose reduction needed -elevated Cr but does not meet additional criteria needed to reduce dose.  History of elevated troponin: -Noted in 10/2017, 01/2018. She has refused ischemic evaluation in past. Will need outpatient ischemic evaluation   Anemia: -Stable. 9.7  9.1. Recommend transfusion below 8. Monitor, daily CBC.  HTN: - SBP labile but improving from high 140s - likely elevated in setting of volume overload. No medication changes needed at this time and will monitor. ACE held d/t renal function as above.  For questions or updates, please contact [facility] Please consult [email or web address] for contact info under        Signed, Lennon Alstrom, PA-C  03/06/2018, 10:09 AM

## 2018-03-07 DIAGNOSIS — J9601 Acute respiratory failure with hypoxia: Secondary | ICD-10-CM

## 2018-03-07 LAB — CBC WITH DIFFERENTIAL/PLATELET
Abs Immature Granulocytes: 0.03 10*3/uL (ref 0.00–0.07)
Basophils Absolute: 0 10*3/uL (ref 0.0–0.1)
Basophils Relative: 0 %
Eosinophils Absolute: 0.1 10*3/uL (ref 0.0–0.5)
Eosinophils Relative: 1 %
HCT: 29.3 % — ABNORMAL LOW (ref 36.0–46.0)
Hemoglobin: 9.4 g/dL — ABNORMAL LOW (ref 12.0–15.0)
Immature Granulocytes: 0 %
Lymphocytes Relative: 14 %
Lymphs Abs: 1 10*3/uL (ref 0.7–4.0)
MCH: 25.9 pg — AB (ref 26.0–34.0)
MCHC: 32.1 g/dL (ref 30.0–36.0)
MCV: 80.7 fL (ref 80.0–100.0)
MONO ABS: 0.8 10*3/uL (ref 0.1–1.0)
Monocytes Relative: 12 %
Neutro Abs: 4.9 10*3/uL (ref 1.7–7.7)
Neutrophils Relative %: 73 %
Platelets: 123 10*3/uL — ABNORMAL LOW (ref 150–400)
RBC: 3.63 MIL/uL — ABNORMAL LOW (ref 3.87–5.11)
RDW: 15.1 % (ref 11.5–15.5)
WBC: 6.8 10*3/uL (ref 4.0–10.5)
nRBC: 0 % (ref 0.0–0.2)

## 2018-03-07 LAB — BASIC METABOLIC PANEL
Anion gap: 6 (ref 5–15)
BUN: 17 mg/dL (ref 8–23)
CO2: 24 mmol/L (ref 22–32)
Calcium: 9.3 mg/dL (ref 8.9–10.3)
Chloride: 103 mmol/L (ref 98–111)
Creatinine, Ser: 1.6 mg/dL — ABNORMAL HIGH (ref 0.44–1.00)
GFR calc Af Amer: 37 mL/min — ABNORMAL LOW (ref >60)
GFR calc non Af Amer: 32 mL/min — ABNORMAL LOW (ref >60)
Glucose, Bld: 150 mg/dL — ABNORMAL HIGH (ref 70–99)
Potassium: 3.4 mmol/L — ABNORMAL LOW (ref 3.5–5.1)
Sodium: 133 mmol/L — ABNORMAL LOW (ref 135–145)

## 2018-03-07 LAB — BRAIN NATRIURETIC PEPTIDE: B Natriuretic Peptide: 1630 pg/mL — ABNORMAL HIGH (ref 0.0–100.0)

## 2018-03-07 LAB — GLUCOSE, CAPILLARY
Glucose-Capillary: 181 mg/dL — ABNORMAL HIGH (ref 70–99)
Glucose-Capillary: 202 mg/dL — ABNORMAL HIGH (ref 70–99)

## 2018-03-07 MED ORDER — SITAGLIPTIN PHOSPHATE 50 MG PO TABS: 50.0000 mg | ORAL_TABLET | Freq: Every day | ORAL | 0 refills | Status: DC

## 2018-03-07 MED ORDER — FLUCONAZOLE 100 MG PO TABS: 100.0000 mg | ORAL_TABLET | Freq: Every day | ORAL | 0 refills | Status: AC

## 2018-03-07 MED ORDER — TAMSULOSIN HCL 0.4 MG PO CAPS: 0.4000 mg | ORAL_CAPSULE | Freq: Every day | ORAL | 0 refills | Status: DC

## 2018-03-07 NOTE — Progress Notes (Signed)
Progress Note  Patient Name: Kristina Archer Date of Encounter: 03/07/2018  Primary Cardiologist: Julien Nordmann, MD   Subjective   Remains on 1 L nasal cannula oxygen this morning Reports improvement in her shortness of breath Has ureteral stent in place, pain resolved Back on Eliquis 5 twice daily   Inpatient Medications    Scheduled Meds: . amiodarone  200 mg Oral Daily  . apixaban  5 mg Oral BID  . carvedilol  3.125 mg Oral BID WC  . fluconazole  100 mg Oral Daily  . furosemide  20 mg Oral Daily  . insulin aspart  0-5 Units Subcutaneous QHS  . insulin aspart  0-9 Units Subcutaneous TID WC  . pantoprazole  40 mg Oral QAC breakfast  . PARoxetine  20 mg Oral Daily  . potassium chloride  20 mEq Oral BID  . rosuvastatin  40 mg Oral q1800  . tamsulosin  0.4 mg Oral Daily  . traZODone  50 mg Oral QHS   Continuous Infusions:  PRN Meds: acetaminophen **OR** acetaminophen, bisacodyl, guaiFENesin, ipratropium-albuterol, ondansetron **OR** ondansetron (ZOFRAN) IV, oxyCODONE, senna, sodium chloride   Vital Signs    Vitals:   03/07/18 0055 03/07/18 0506 03/07/18 0639 03/07/18 1154  BP:  (!) 123/50  129/60  Pulse:  70 72 (!) 58  Resp:  20    Temp:  98 F (36.7 C)    TempSrc:  Oral    SpO2:  92% 93% 98%  Weight: 83.5 kg     Height:        Intake/Output Summary (Last 24 hours) at 03/07/2018 1621 Last data filed at 03/07/2018 3235 Gross per 24 hour  Intake 0 ml  Output -  Net 0 ml   Filed Weights   03/02/18 1058 03/06/18 0705 03/07/18 0055  Weight: 77.1 kg 80.6 kg 83.5 kg    Telemetry    rates in 60s, ectopy (occasional), LBBB (known)- Personally Reviewed  ECG    No new tracings- Personally Reviewed  Physical Exam   Constitutional:  oriented to person, place, and time. No distress.  On nasal cannula oxygen HENT:  Head: Grossly normal Eyes:  no discharge. No scleral icterus.  Neck: No JVD, no carotid bruits  Cardiovascular: Regular rate and rhythm, no  murmurs appreciated Pulmonary/Chest: Clear to auscultation bilaterally, no wheezes or rails Abdominal: Soft.  no distension.  no tenderness.  Musculoskeletal: Normal range of motion Neurological:  normal muscle tone. Coordination normal. No atrophy Skin: Skin warm and dry Psychiatric: normal affect, pleasant   Labs    Chemistry Recent Labs  Lab 03/05/18 0552 03/06/18 0412 03/07/18 0537  NA 135 135 133*  K 4.1 3.3* 3.4*  CL 101 104 103  CO2 24 26 24   GLUCOSE 192* 118* 150*  BUN 19 19 17   CREATININE 1.88* 1.89* 1.60*  CALCIUM 9.8 9.4 9.3  GFRNONAA 27* 26* 32*  GFRAA 31* 31* 37*  ANIONGAP 10 5 6      Hematology Recent Labs  Lab 03/05/18 0839 03/06/18 0412 03/07/18 0537  WBC 8.2 6.4 6.8  RBC 3.69* 3.52* 3.63*  HGB 9.7* 9.1* 9.4*  HCT 29.5* 28.2* 29.3*  MCV 79.9* 80.1 80.7  MCH 26.3 25.9* 25.9*  MCHC 32.9 32.3 32.1  RDW 15.3 15.3 15.1  PLT 123* 128* 123*    Cardiac EnzymesNo results for input(s): TROPONINI in the last 168 hours. No results for input(s): TROPIPOC in the last 168 hours.   BNP Recent Labs  Lab 03/05/18 [phone] 03/07/18 2025  BNP 2,267.0* 1,630.0*     DDimer No results for input(s): DDIMER in the last 168 hours.   Radiology    No results found.  Cardiac Studies   TTE 10/2017: - Left ventricle: The cavity size was mildly dilated. Systolic function was severely reduced. The estimated ejection fraction was in the range of 20% to 25%. Diffuse hypokinesis. Regional wall motion abnormalities cannot be excluded. The study is not technically sufficient to allow evaluation of LV diastolic function. - Mitral valve: There was mild regurgitation. - Left atrium: The atrium was normal in size. - Right ventricle: Systolic function was normal. - Pulmonary arteries: Systolic pressure could not be accurately estimated. Impressions: - Rhythm is wide complex tachycardia rate 129 bpm.   TTE 12/2017: - Limited study for evaluation of left  ventricular systolic function. - Left ventricle: Systolic function was severely reduced. The estimated ejection fraction was in the range of 20% to 25%. Diffuse hypokinesis. Regional wall motion abnormalities cannot be excluded. Doppler parameters are consistent with abnormal left ventricular relaxation (grade 1 diastolic dysfunction). Doppler parameters are consistent with high ventricular filling pressure. - Mitral valve: Calcified annulus. There was mild to moderate regurgitation. - Pericardium, extracardiac: A trivial pericardial effusion was identified.  Patient Profile     72 y.o. female with a hx of HFrEF with prior refusal of ischemic evaluation, recently diagnosed PAF in 10/2017 on Eliquis, SVT, LBBB, anemia, recurrent ureteral stone requiring prior cystoscopy and ureteral stenting, DM2, HTN, and HLD who is being seen today for the evaluation of acute on chronic HFrEF at the request of Dr. Imogene Burn.  Assessment & Plan    Acute on Chronic HFrEF: Back on Lasix 20 daily Appears relatively euvolemic Continue Coreg 3.125 mg twice daily ACE inhibitor, ARB on hold secondary to renal dysfunction  AKI -Baseline Cr 0.85.  Creatinine down to 1.6 Back on gentle diuresis, Lasix 20 daily  PAF/SVT: -EKG rechecked 1/23 to confirm maintaining sinus rhythm (known LBBB) -Continue amiodarone 200mg  daily and carvedilol 3.125 mg twice daily Eliquis 5mg  BID. CHADS2VASc of at least 6 (CHF, HTN, age x 1, DM, vascular disease, female).   History of elevated troponin: She has refused ischemic evaluation in past.  We will discuss again as outpatient, consider outpatient ischemic evaluation   Anemia: -Stable. 9.7  9.1.   HTN: Blood pressure is well controlled on today's visit. No changes made to the medications.   Total encounter time more than 25 minutes  Greater than 50% was spent in counseling and coordination of care with the patient   For questions or updates, please  contact [facility] Please consult [email or web address] for contact info under        Signed, Julien Nordmann, MD  03/07/2018, 4:21 PM

## 2018-03-07 NOTE — Discharge Summary (Signed)
[employer] [facility] [place] [facility]   PATIENT NAME: Kristina Archer    MR#:  [id number]  DATE OF BIRTH:  1945/10/21  DATE OF ADMISSION:  03/01/2018 ADMITTING PHYSICIAN: Delfino Lovett, MD  DATE OF DISCHARGE: 03/07/2018  PRIMARY CARE PHYSICIAN: Corky Downs, MD    ADMISSION DIAGNOSIS:  Acute renal insufficiency [N28.9] Right renal stone [N20.0]  DISCHARGE DIAGNOSIS:  Principal Problem:   Ureterolithiasis Active Problems:   Acute on chronic systolic heart failure (HCC)   HLD (hyperlipidemia)   Paroxysmal atrial fibrillation (HCC)   GERD (gastroesophageal reflux disease)   Anxiety   Acute respiratory failure with hypoxia (HCC)   Acute renal insufficiency   SECONDARY DIAGNOSIS:   Past Medical History:  Diagnosis Date  . Anxiety   . Cardiomyopathy (HCC)    a. 10/2017 Echo: EF 20-25%, diff HK, mild MR. Nl RV size.  . DDD (degenerative disc disease), lumbar   . Diverticulosis   . Elevated troponin   . Essential hypertension   . GERD (gastroesophageal reflux disease)   . History of kidney stones   . Hyperlipidemia   . LBBB (left bundle branch block)   . PAF (paroxysmal atrial fibrillation) (HCC)    a.  Diagnosed 10/2017; b.  On Eliquis and amiodarone; c. CHADS2VASc => 5 (CHF, HTN, age x 1, DM, female)  . Pneumonia   . Sciatica   . Type II diabetes mellitus [facility])     HOSPITAL COURSE:   73 year old female with chronic HFrEF, PAF on Eliquis and diabetes who presented to the emergency room due to flank pain.  1.  12 mm right proximal ureteral stone with hydronephrosis: Patient was evaluated by urology.  She underwent cystoscopy and right ureteral stent placement.  Recommendations were to continue Flomax and have outpatient follow-up with urology.   2.  Urinary tract infection: Urine culture is showing yeast and as per urology treat with fluconazole for total of 7 days.   3.  Acute kidney injury in the setting of dehydration: This is improved  4.  Acute  hypoxic respiratory failure due to acute on chronic systolic heart failure: Ejection fraction is 20 to 25%.  Patient was evaluated by cardiology while in the hospital.  Patient is now euvolemic.  Patient is being referred to CHF clinic. Patient will continue on Lasix 20 mg daily.  She will also continue on low-dose Lasix.  Due to acute kidney injury and soft blood pressure in addition of ACE inhibitor/ARB and aldosterone antagonist will be deferred Patient has previously declined catheterization to further evaluate etiology of cardiomyopathy.  5.  PAF: Patient is currently in normal sinus rhythm: She will continue on amiodarone and Eliquis.   DISCHARGE CONDITIONS AND DIET:   Stable for discharge on heart healthy diet  CONSULTS OBTAINED:  Treatment Team:  Riki Altes, MD Iran Ouch, MD  DRUG ALLERGIES:   Allergies  Allergen Reactions  . Codeine Nausea Only    DISCHARGE MEDICATIONS:   Allergies as of 03/07/2018      Reactions   Codeine Nausea Only      Medication List    STOP taking these medications   lisinopril 5 MG tablet Commonly known as:  PRINIVIL,ZESTRIL   metFORMIN 500 MG 24 hr tablet Commonly known as:  GLUCOPHAGE-XR     TAKE these medications   amiodarone 200 MG tablet Commonly known as:  PACERONE Take 1 tablet (200 mg total) by mouth daily.   apixaban 5 MG Tabs tablet Commonly known as:  ELIQUIS Take 1 tablet (5 mg total) by mouth 2 (two) times daily.   carvedilol 3.125 MG tablet Commonly known as:  COREG Take 1 tablet (3.125 mg total) by mouth 2 (two) times daily with a meal.   docusate sodium 100 MG capsule Commonly known as:  COLACE Take 1 capsule (100 mg total) by mouth 2 (two) times daily.   feeding supplement (GLUCERNA SHAKE) Liqd Take 237 mLs by mouth 2 (two) times daily between meals.   fluconazole 100 MG tablet Commonly known as:  DIFLUCAN Take 1 tablet (100 mg total) by mouth daily for 3 days.   furosemide 20 MG  tablet Commonly known as:  LASIX Take 20 mg by mouth daily.   HYDROcodone-acetaminophen 5-325 MG tablet Commonly known as:  NORCO/VICODIN Take 1-2 tablets by mouth every 6 (six) hours as needed for moderate pain.   multivitamin with minerals Tabs tablet Take 1 tablet by mouth daily.   pantoprazole 40 MG tablet Commonly known as:  PROTONIX Take 1 tablet (40 mg total) by mouth daily before breakfast.   PARoxetine 20 MG tablet Commonly known as:  PAXIL Take 20 mg by mouth at bedtime.   potassium chloride SA 20 MEQ tablet Commonly known as:  K-DUR,KLOR-CON Take 2 tablets (40 mEq total) by mouth at bedtime.   rosuvastatin 40 MG tablet Commonly known as:  CRESTOR Take 1 tablet (40 mg total) by mouth daily.   sitaGLIPtin 50 MG tablet Commonly known as:  JANUVIA Take 1 tablet (50 mg total) by mouth daily. What changed:    medication strength  how much to take   tamsulosin 0.4 MG Caps capsule Commonly known as:  FLOMAX Take 1 capsule (0.4 mg total) by mouth daily.   traZODone 50 MG tablet Commonly known as:  DESYREL Take 1 tablet by mouth at bedtime.            Durable Medical Equipment  (From admission, onward)         Start     Ordered   03/07/18 1010  DME Oxygen  Once    Question Answer Comment  Mode or (Route) Nasal cannula   Liters per Minute 1   Frequency Continuous (stationary and portable oxygen unit needed)   Oxygen conserving device Yes   Oxygen delivery system Gas      03/07/18 1012            Today   CHIEF COMPLAINT:  Patient is doing well this morning.  No flank pain.  No shortness of breath.   VITAL SIGNS:  Blood pressure 129/60, pulse (!) 58, temperature 98 F (36.7 C), temperature source Oral, resp. rate 20, height 5\' 2"  (1.575 m), weight 83.5 kg, SpO2 98 %.   REVIEW OF SYSTEMS:  Review of Systems  Constitutional: Negative.  Negative for chills, fever and malaise/fatigue.  HENT: Negative.  Negative for ear discharge, ear  pain, hearing loss, nosebleeds and sore throat.   Eyes: Negative.  Negative for blurred vision and pain.  Respiratory: Negative.  Negative for cough, hemoptysis, shortness of breath and wheezing.   Cardiovascular: Negative.  Negative for chest pain, palpitations and leg swelling.  Gastrointestinal: Negative.  Negative for abdominal pain, blood in stool, diarrhea, nausea and vomiting.  Genitourinary: Negative.  Negative for dysuria.  Musculoskeletal: Negative.  Negative for back pain.  Skin: Negative.   Neurological: Negative for dizziness, tremors, speech change, focal weakness, seizures and headaches.  Endo/Heme/Allergies: Negative.  Does not bruise/bleed easily.  Psychiatric/Behavioral: Negative.  Negative  for depression, hallucinations and suicidal ideas.     PHYSICAL EXAMINATION:  GENERAL:  73 y.o.-year-old patient lying in the bed with no acute distress.  NECK:  Supple, no jugular venous distention. No thyroid enlargement, no tenderness.  LUNGS: Normal breath sounds bilaterally, no wheezing, rales,rhonchi  No use of accessory muscles of respiration.  CARDIOVASCULAR: S1, S2 normal. No murmurs, rubs, or gallops.  ABDOMEN: Soft, non-tender, non-distended. Bowel sounds present. No organomegaly or mass.  EXTREMITIES: No pedal edema, cyanosis, or clubbing.  PSYCHIATRIC: The patient is alert and oriented x 3.  SKIN: No obvious rash, lesion, or ulcer.   DATA REVIEW:   CBC Recent Labs  Lab 03/07/18 0537  WBC 6.8  HGB 9.4*  HCT 29.3*  PLT 123*    Chemistries  Recent Labs  Lab 03/06/18 0412 03/07/18 0537  NA 135 133*  K 3.3* 3.4*  CL 104 103  CO2 26 24  GLUCOSE 118* 150*  BUN 19 17  CREATININE 1.89* 1.60*  CALCIUM 9.4 9.3  MG 2.0  --     Cardiac Enzymes No results for input(s): TROPONINI in the last 168 hours.  Microbiology Results  @  RADIOLOGY:  No results found.    Allergies as of 03/07/2018      Reactions   Codeine Nausea Only       Medication List    STOP taking these medications   lisinopril 5 MG tablet Commonly known as:  PRINIVIL,ZESTRIL   metFORMIN 500 MG 24 hr tablet Commonly known as:  GLUCOPHAGE-XR     TAKE these medications   amiodarone 200 MG tablet Commonly known as:  PACERONE Take 1 tablet (200 mg total) by mouth daily.   apixaban 5 MG Tabs tablet Commonly known as:  ELIQUIS Take 1 tablet (5 mg total) by mouth 2 (two) times daily.   carvedilol 3.125 MG tablet Commonly known as:  COREG Take 1 tablet (3.125 mg total) by mouth 2 (two) times daily with a meal.   docusate sodium 100 MG capsule Commonly known as:  COLACE Take 1 capsule (100 mg total) by mouth 2 (two) times daily.   feeding supplement (GLUCERNA SHAKE) Liqd Take 237 mLs by mouth 2 (two) times daily between meals.   fluconazole 100 MG tablet Commonly known as:  DIFLUCAN Take 1 tablet (100 mg total) by mouth daily for 3 days.   furosemide 20 MG tablet Commonly known as:  LASIX Take 20 mg by mouth daily.   HYDROcodone-acetaminophen 5-325 MG tablet Commonly known as:  NORCO/VICODIN Take 1-2 tablets by mouth every 6 (six) hours as needed for moderate pain.   multivitamin with minerals Tabs tablet Take 1 tablet by mouth daily.   pantoprazole 40 MG tablet Commonly known as:  PROTONIX Take 1 tablet (40 mg total) by mouth daily before breakfast.   PARoxetine 20 MG tablet Commonly known as:  PAXIL Take 20 mg by mouth at bedtime.   potassium chloride SA 20 MEQ tablet Commonly known as:  K-DUR,KLOR-CON Take 2 tablets (40 mEq total) by mouth at bedtime.   rosuvastatin 40 MG tablet Commonly known as:  CRESTOR Take 1 tablet (40 mg total) by mouth daily.   sitaGLIPtin 50 MG tablet Commonly known as:  JANUVIA Take 1 tablet (50 mg total) by mouth daily. What changed:    medication strength  how much to take   tamsulosin 0.4 MG Caps capsule Commonly known as:  FLOMAX Take 1 capsule (0.4 mg total) by mouth daily.    traZODone  50 MG tablet Commonly known as:  DESYREL Take 1 tablet by mouth at bedtime.            Durable Medical Equipment  (From admission, onward)         Start     Ordered   03/07/18 1010  DME Oxygen  Once    Question Answer Comment  Mode or (Route) Nasal cannula   Liters per Minute 1   Frequency Continuous (stationary and portable oxygen unit needed)   Oxygen conserving device Yes   Oxygen delivery system Gas      03/07/18 1012              Management plans discussed with the patient and she is in agreement. Stable for discharge home with [facility]  Patient should follow up with cardiology  CODE STATUS:     Code Status Orders  (From admission, onward)         Start     Ordered   03/02/18 0323  Full code  Continuous     03/02/18 0322        Code Status History    Date Active Date Inactive Code Status Order ID Comments User Context   02/01/2018 0550 02/04/2018 1538 Full Code [id number]  Arnaldo Natal, MD ED   12/18/2017 0210 12/22/2017 1710 DNR [id number]  Cammy Copa, MD Inpatient   11/04/2017 1129 11/13/2017 1657 DNR [id number]  Katha Hamming, MD Inpatient   11/02/2017 1524 11/04/2017 1129 Full Code [id number]  Katha Hamming, MD ED      TOTAL TIME TAKING CARE OF THIS PATIENT: 38 minutes.    Note: This dictation was prepared with Dragon dictation along with smaller phrase technology. Any transcriptional errors that result from this process are unintentional.  Finnleigh Marchetti M.D on 03/07/2018 at 12:01 PM  Between 7am to 6pm - Pager - [phone] After 6pm go to [email or web address] - Social research officer, government  [facility]  Office  [phone]  CC: Primary care physician; Corky Downs, MD

## 2018-03-07 NOTE — Progress Notes (Signed)
PT Cancellation Note  Patient Details Name: Kristina Archer MRN: [id number] DOB: 01/07/1946   Cancelled Treatment:    Reason Eval/Treat Not Completed: (Consult received and chart reviewed.  Per primary RN (and documentation), patient has been ambulatory around nursing station without difficulty. No PT needs identified per team.  Will complete order at this time; please re-consult should needs change.)   Tamarah Bhullar H. Manson Passey, PT, DPT, NCS 03/07/18, 11:03 AM [phone]

## 2018-03-07 NOTE — Progress Notes (Signed)
Pt. ambulated around nursing station on RA. O2 sat was 95 %. No SOB. No signs of acute distress.  Discharge order received. Patient is alert and oriented. Vital signs stable. Discharge instructions given. Patient verbalized understanding. No other issues noted at this time.

## 2018-03-07 NOTE — Care Management Note (Signed)
Case Management Note  Patient Details  Name: Kristina Archer MRN: [id number] Date of Birth: 1945-06-30   Patient to discharge today.  Bedside RN has checked exertional sats on RA, and patient does not qualify for home O2.  Patient to discharge with resumption of home health orders through [facility] home health.  [place] [facility] notified of discharge.   Subjective/Objective:                    Action/Plan:   Expected Discharge Date:  03/07/18               Expected Discharge Plan:  Home w Home Health Services  In-House Referral:     Discharge planning Services  CM Consult  Post Acute Care Choice:  Home Health, Resumption of Svcs/PTA Provider Choice offered to:     DME Arranged:    DME Agency:     HH Arranged:  RN, PT, Nurse's Aide HH Agency:  [facility]  Status of Service:  Completed, signed off  If discussed at Long Length of Stay Meetings, dates discussed:    Additional Comments:  Chapman Fitch, RN 03/07/2018, 12:57 PM

## 2018-03-12 ENCOUNTER — Other Ambulatory Visit: Payer: Self-pay | Admitting: Radiology

## 2018-03-12 DIAGNOSIS — N201 Calculus of ureter: Secondary | ICD-10-CM

## 2018-03-13 ENCOUNTER — Telehealth: Payer: Self-pay | Admitting: Radiology

## 2018-03-13 ENCOUNTER — Other Ambulatory Visit: Payer: Self-pay | Admitting: Radiology

## 2018-03-13 NOTE — Telephone Encounter (Signed)
Discussed the [facility] Surgery Information form below. Patient requests surgery be postponed until 03/30/2018 because of illness.   [address], [address] [place], [place] [address] Telephone: [phone] Fax: [phone]   Thank you for choosing [facility] for your upcoming surgery!  We are always here to assist in your urological needs.  Please read the following information with specific details for your upcoming appointments related to your surgery. Please contact Shreyansh Tiffany at [phone] Option 3 with any questions.  The Name of Your Surgery: Right ureteroscopy, laser lithotripsy, stone removal, ureteral stent exchange Your Surgery Date: 03/30/2018 Your Surgeon: Legrand Rams  Please call Same Day Surgery at [phone] between the hours of 1pm-3pm one day prior to your surgery. They will inform you of the time to arrive at Same Day Surgery which is located on the second floor of the [facility].   Please refer to the attached letter regarding instructions for Pre-Admission Testing. You will receive a call from the Pre-Admission Testing office regarding your appointment with them.  The Pre-Admission Testing office is located at [address], on the first floor of the [facility] in [address] (office is to the right as you enter through the [employer] of the [employer]). Please have all medications you are currently taking and your insurance card available.   Patient was advised to have nothing to eat or drink after midnight the night prior to surgery except that she may have only water until 2 hours before surgery with nothing to drink within 2 hours of surgery.  The patient states she currently takes Eliquis & was informed to hold medication for 2 days prior to surgery beginning on 03/28/2018 per clearance obtained from Eula Listen  with cardiologist's office. Patient's questions were answered and she expressed understanding of these instructions.

## 2018-03-13 NOTE — Progress Notes (Deleted)
   Patient ID: Kristina Archer, female    DOB: 1945-02-11, 73 y.o.   MRN: [id number]  HPI  Ms Kristina Archer is a 73 y/o female with a history of   Echo report from 01/09/18 reviewed and showed an EF of 20-25% along with mild/moderate MR.   Admitted 03/01/2018 due to12 mm right proximal ureteral stone with hydronephrosis along with HF exacerbation. Cardiology and urology consults obtained. Cystoscopy and right ureteral stent placement was done. Discharged after 6 days. Admitted 02/01/2018 due to sepsis. Urology consult obtained. IV fluids given and then IV lasix was given due to fluid overload. Discharged after 3 days.   She presents today for her initial visit with a chief complaint of   Review of Systems    Physical Exam    Assessment & Plan:  1: Chronic heart failure with reduced ejection fraction- - NYHA class - BNP from 03/07/2018 was 1630.0  2: HTN- - BP - BMP from 03/07/2018 reviewed and showed sodium 133, potassium 3.4, creatinine 1.6 and GFR 32  3: DM- - A1c 02/01/2018 was 6.5%

## 2018-03-14 ENCOUNTER — Ambulatory Visit: Payer: Medicare Other | Admitting: Urology

## 2018-03-14 ENCOUNTER — Other Ambulatory Visit: Payer: Medicare Other

## 2018-03-15 ENCOUNTER — Ambulatory Visit: Payer: Medicare Other | Admitting: Family

## 2018-03-15 ENCOUNTER — Telehealth: Payer: Self-pay | Admitting: Family

## 2018-03-15 NOTE — Telephone Encounter (Signed)
Patient did not show for her Heart Failure Clinic appointment on 03/15/2018. Will attempt to reschedule.

## 2018-03-22 ENCOUNTER — Other Ambulatory Visit: Payer: Self-pay

## 2018-03-22 ENCOUNTER — Other Ambulatory Visit: Payer: Medicare Other

## 2018-03-22 ENCOUNTER — Encounter
Admission: RE | Admit: 2018-03-22 | Discharge: 2018-03-22 | Disposition: A | Discharge status: Home / Self Care | Payer: Medicare Other | Source: Ambulatory Visit | Attending: Urology | Admitting: Urology

## 2018-03-22 DIAGNOSIS — Z01812 Encounter for preprocedural laboratory examination: Secondary | ICD-10-CM | POA: Insufficient documentation

## 2018-03-22 LAB — BASIC METABOLIC PANEL
Anion gap: 8 (ref 5–15)
BUN: 14 mg/dL (ref 8–23)
CO2: 26 mmol/L (ref 22–32)
CREATININE: 1.15 mg/dL — AB (ref 0.44–1.00)
Calcium: 10.2 mg/dL (ref 8.9–10.3)
Chloride: 103 mmol/L (ref 98–111)
GFR calc Af Amer: 56 mL/min — ABNORMAL LOW (ref >60)
GFR calc non Af Amer: 48 mL/min — ABNORMAL LOW (ref >60)
Glucose, Bld: 211 mg/dL — ABNORMAL HIGH (ref 70–99)
Potassium: 3.7 mmol/L (ref 3.5–5.1)
Sodium: 137 mmol/L (ref 135–145)

## 2018-03-22 LAB — URINALYSIS, ROUTINE W REFLEX MICROSCOPIC
BILIRUBIN URINE: NEGATIVE
Bacteria, UA: NONE SEEN
Glucose, UA: 50 mg/dL — AB
Ketones, ur: NEGATIVE mg/dL
Nitrite: NEGATIVE
Protein, ur: 100 mg/dL — AB
Specific Gravity, Urine: 1.011 (ref 1.005–1.030)
WBC, UA: >50 WBC/hpf — ABNORMAL HIGH (ref 0–5)
pH: 6 (ref 5.0–8.0)

## 2018-03-22 LAB — CBC
HCT: 34.7 % — ABNORMAL LOW (ref 36.0–46.0)
Hemoglobin: 11.1 g/dL — ABNORMAL LOW (ref 12.0–15.0)
MCH: 25.8 pg — ABNORMAL LOW (ref 26.0–34.0)
MCHC: 32 g/dL (ref 30.0–36.0)
MCV: 80.7 fL (ref 80.0–100.0)
NRBC: 0 % (ref 0.0–0.2)
Platelets: 137 10*3/uL — ABNORMAL LOW (ref 150–400)
RBC: 4.3 MIL/uL (ref 3.87–5.11)
RDW: 15.7 % — AB (ref 11.5–15.5)
WBC: 5.1 10*3/uL (ref 4.0–10.5)

## 2018-03-22 NOTE — Pre-Procedure Instructions (Signed)
Abnormal urinalysis faxed to Dr Keane Scrape office. Urine culture processing.

## 2018-03-22 NOTE — Patient Instructions (Signed)
Your procedure is scheduled on: March 30, 2018 FRIDAY Report to Day Surgery on the 2nd floor of the [facility]. To find out your arrival time, please call [phone] between 1PM - 3PM on:MARCH 19 ,2020 THURSDAY  REMEMBER: Instructions that are not followed completely may result in serious medical risk, up to and including death; or upon the discretion of your surgeon and anesthesiologist your surgery may need to be rescheduled.  Do not eat food after midnight the night before surgery.  No gum chewing, lozengers or hard candies.  You may however, drink CLEAR liquids up to 2 hours before you are scheduled to arrive for your surgery. Do not drink anything within 2 hours of the start of your surgery.  Clear liquids include: - water  Do NOT drink anything that is not on this list.  Type 1 and Type 2 diabetics should only drink water.   No Alcohol for 24 hours before or after surgery.  No Smoking including e-cigarettes for 24 hours prior to surgery.  No chewable tobacco products for at least 6 hours prior to surgery.  No nicotine patches on the day of surgery.  On the morning of surgery brush your teeth with toothpaste and water, you may rinse your mouth with mouthwash if you wish. Do not swallow any toothpaste or mouthwash.  Notify your doctor if there is any change in your medical condition (cold, fever, infection).  Do not wear jewelry, make-up, hairpins, clips or nail polish.  Do not wear lotions, powders, or perfumes.   Do not shave 48 hours prior to surgery.   Contacts and dentures may not be worn into surgery.  Do not bring valuables to the hospital, including drivers license, insurance or credit cards.  [place] is not responsible for any belongings or valuables.   TAKE THESE MEDICATIONS THE MORNING OF SURGERY: AMIODARONE CARVEDILOL PANTOPRAZOLE (take one the night before and one on the morning of surgery - helps to prevent nausea after surgery.)  SHOWER  MORNING OF SURGERY   Stop Metformin  2 days prior to surgery. LAST DOSE Tuesday 03-27-2018   Follow recommendations from Cardiologist, Pulmonologist or PCP regarding stopping  Eliquis - STOP TAKING ELIQUIS LAST DOSE ON 03-27-2018  Stop Anti-inflammatories (NSAIDS) such as Advil, Aleve, Ibuprofen, Motrin, Naproxen, Naprosyn and Aspirin based products such as Excedrin, Goodys Powder, BC Powder. (May take Tylenol or Acetaminophen if needed.)  Stop ANY OVER THE COUNTER supplements until after surgery. (May continue Vitamin D, Vitamin B, and multivitamin.)  Wear comfortable clothing (specific to your surgery type) to the hospital.  Plan for stool softeners for home use.  If you are being discharged the day of surgery, you will not be allowed to drive home. You will need a responsible adult to drive you home and stay with you that night.   If you are taking public transportation, you will need to have a responsible adult with you. Please confirm with your physician that it is acceptable to use public transportation.   Please call [phone] if you have any questions about these instructions.

## 2018-03-25 LAB — URINE CULTURE

## 2018-03-25 NOTE — Progress Notes (Deleted)
Cardiology Office Note Date:  03/25/2018  Patient ID:  Kristina Archer, Kristina Archer 06-05-1945, MRN [id number] PCP:  Corky Downs, MD  Cardiologist:  Dr. Mariah Milling, MD  ***refresh   Chief Complaint: Follow up  History of Present Illness: Kristina Archer is a 73 y.o. female with history of ***   Past Medical History:  Diagnosis Date  . Anxiety   . Cardiomyopathy (HCC)    a. 10/2017 Echo: EF 20-25%, diff HK, mild MR. Nl RV size.  . DDD (degenerative disc disease), lumbar   . Diverticulosis   . Elevated troponin   . Essential hypertension   . GERD (gastroesophageal reflux disease)   . History of kidney stones   . Hyperlipidemia   . LBBB (left bundle branch block)   . PAF (paroxysmal atrial fibrillation) (HCC)    a.  Diagnosed 10/2017; b.  On Eliquis and amiodarone; c. CHADS2VASc => 5 (CHF, HTN, age x 1, DM, female)  . Pneumonia   . Sciatica   . Type II diabetes mellitus (HCC)     Past Surgical History:  Procedure Laterality Date  . ABDOMINAL HYSTERECTOMY    . CHOLECYSTECTOMY    . CYSTOSCOPY W/ RETROGRADES Bilateral 05/24/2017   Procedure: CYSTOSCOPY WITH RETROGRADE PYELOGRAM;  Surgeon: Vanna Scotland, MD;  Location: [facility] ORS;  Service: Urology;  Laterality: Bilateral;  . CYSTOSCOPY W/ RETROGRADES Right 01/29/2018   Procedure: CYSTOSCOPY WITH RETROGRADE PYELOGRAM;  Surgeon: Vanna Scotland, MD;  Location: [facility] ORS;  Service: Urology;  Laterality: Right;  . CYSTOSCOPY W/ RETROGRADES Right 03/02/2018   Procedure: CYSTOSCOPY WITH RETROGRADE PYELOGRAM;  Surgeon: Sondra Come, MD;  Location: [facility] ORS;  Service: Urology;  Laterality: Right;  . CYSTOSCOPY W/ URETERAL STENT PLACEMENT Right 11/02/2017   Procedure: CYSTOSCOPY WITH RETROGRADE PYELOGRAM/URETERAL STENT PLACEMENT;  Surgeon: Crista Elliot, MD;  Location: [facility] ORS;  Service: Urology;  Laterality: Right;  . CYSTOSCOPY WITH STENT PLACEMENT Right 03/02/2018   Procedure: CYSTOSCOPY WITH STENT PLACEMENT-RIGHT;  Surgeon: Sondra Come, MD;  Location: [facility] OR[facility];  Service: Urology;  Laterality: Right;  . CYSTOSCOPY/URETEROSCOPY/HOLMIUM LASER/STENT PLACEMENT Left 05/24/2017   Procedure: CYSTOSCOPY/URETEROSCOPY/HOLMIUM LASER/STENT PLACEMENT;  Surgeon: Vanna Scotland, MD;  Location: [facility] OR[facility];  Service: Urology;  Laterality: Left;  . CYSTOSCOPY/URETEROSCOPY/HOLMIUM LASER/STENT PLACEMENT Right 01/29/2018   Procedure: CYSTOSCOPY/URETEROSCOPY/HOLMIUM LASER/STENT Exchange;  Surgeon: Vanna Scotland, MD;  Location: [facility] ORS;  Service: Urology;  Laterality: Right;    No outpatient medications have been marked as taking for the 04/06/18 encounter (Appointment) with Sondra Barges, PA-C.    Allergies:   Codeine   Social History:  The patient  reports that she quit smoking about 20 years ago. Her smoking use included cigarettes. She has a 22.50 pack-year smoking history. She has never used smokeless tobacco. She reports previous alcohol use. She reports that she does not use drugs.   Family History:  The patient's family history includes Heart attack (age of onset: 28) in her father.  ROS:   ROS   PHYSICAL EXAM: *** VS:  There were no vitals taken for this visit. BMI: There is no height or weight on file to calculate BMI.  Physical Exam   EKG:  Was ordered and interpreted by me today. Shows ***  Recent Labs: 02/01/2018: ALT 11; TSH 0.544 03/06/2018: Magnesium 2.0 03/07/2018: B Natriuretic Peptide 1,630.0 03/22/2018: BUN 14; Creatinine, Ser 1.15; Hemoglobin 11.1; Platelets 137; Potassium 3.7; Sodium 137  No results found for requested labs within last 8760 hours.   Estimated Creatinine  Clearance: 43.8 mL/min (A) (by C-G formula based on SCr of 1.15 mg/dL (H)).   Wt Readings from Last 3 Encounters:  03/22/18 170 lb (77.1 kg)  03/07/18 184 lb 1.6 oz (83.5 kg)  02/04/18 176 lb 3.2 oz (79.9 kg)     Other studies reviewed: Additional studies/records reviewed today include: summarized above  ASSESSMENT AND PLAN:  1. ***   Disposition: F/u with Dr. Mariah Milling or an APP in ***.  Current medicines are reviewed at length with the patient today.  The patient did not have any concerns regarding medicines.  Signed, Eula Listen, PA-C 03/25/2018 3:03 PM     [name] [address] [place], [place] [address] [phone]

## 2018-03-26 NOTE — Pre-Procedure Instructions (Signed)
Faxed abnormal UC result to Dr. Richardo Hanks. Fax confirmation received.

## 2018-03-27 ENCOUNTER — Telehealth: Payer: Self-pay

## 2018-03-27 MED ORDER — NITROFURANTOIN MONOHYD MACRO 100 MG PO CAPS: 100.0000 mg | ORAL_CAPSULE | Freq: Two times a day (BID) | ORAL | 0 refills | Status: DC

## 2018-03-27 NOTE — Telephone Encounter (Signed)
-----   Message from Sondra Come, MD sent at 03/27/2018  1:06 PM EDT ----- Please start her on 3 days of Nitrofurantoin 100mg  BID to decrease risk of infection from URS Friday. Abx should start today. Thanks  Legrand Rams, MD 03/27/2018

## 2018-03-27 NOTE — Telephone Encounter (Signed)
Patient notified

## 2018-03-29 DIAGNOSIS — I480 Paroxysmal atrial fibrillation: Secondary | ICD-10-CM | POA: Diagnosis not present

## 2018-03-29 DIAGNOSIS — E119 Type 2 diabetes mellitus without complications: Secondary | ICD-10-CM | POA: Diagnosis not present

## 2018-03-29 DIAGNOSIS — I10 Essential (primary) hypertension: Secondary | ICD-10-CM | POA: Diagnosis not present

## 2018-03-29 DIAGNOSIS — N201 Calculus of ureter: Secondary | ICD-10-CM | POA: Diagnosis present

## 2018-03-29 DIAGNOSIS — Z7901 Long term (current) use of anticoagulants: Secondary | ICD-10-CM | POA: Diagnosis not present

## 2018-03-29 MED ORDER — FLUCONAZOLE 100MG IVPB
100.0000 mg | Freq: Once | INTRAVENOUS | Status: AC
  Administered 2018-03-30: 100 mg via INTRAVENOUS
  Filled 2018-03-29: qty 50

## 2018-03-29 MED ORDER — PIPERACILLIN-TAZOBACTAM 3.375 G IVPB 30 MIN
3.3750 g | Freq: Once | INTRAVENOUS | Status: AC
  Administered 2018-03-30: 3.375 g via INTRAVENOUS
  Filled 2018-03-29: qty 50

## 2018-03-29 NOTE — Consult Note (Signed)
Pharmacy Antibiotic Note  Kristina Archer is a 73 y.o. female admitted 03/29/17 for surgical procedure.    Pharmacy has been consulted for Zosyn dosing for surgical prophylaxis.  Plan: Zosyn 3.375g IV x 1 dose pre-op has been ordered.     Estimated Creatinine Clearance: 43.8 mL/min (A) (by C-G formula based on SCr of 1.15 mg/dL (H)).    Allergies  Allergen Reactions  . Codeine Nausea Only    Thank you for allowing pharmacy to be a part of this patient's care.  Gardner Candle, PharmD, BCPS Clinical Pharmacist 03/29/2018 10:07 PM

## 2018-03-30 ENCOUNTER — Encounter
Admission: RE | Disposition: A | Discharge status: Home / Self Care | Payer: Self-pay | Source: Home / Self Care | Attending: Urology

## 2018-03-30 ENCOUNTER — Ambulatory Visit
Admission: RE | Admit: 2018-03-30 | Discharge: 2018-03-30 | Disposition: A | Discharge status: Home / Self Care | Payer: Medicare Other | Attending: Urology | Admitting: Urology

## 2018-03-30 ENCOUNTER — Ambulatory Visit: Payer: Medicare Other | Admitting: Certified Registered"

## 2018-03-30 ENCOUNTER — Telehealth: Payer: Self-pay | Admitting: Urology

## 2018-03-30 ENCOUNTER — Other Ambulatory Visit: Payer: Self-pay

## 2018-03-30 ENCOUNTER — Encounter: Payer: Self-pay | Admitting: *Deleted

## 2018-03-30 DIAGNOSIS — Z7901 Long term (current) use of anticoagulants: Secondary | ICD-10-CM | POA: Insufficient documentation

## 2018-03-30 DIAGNOSIS — E119 Type 2 diabetes mellitus without complications: Secondary | ICD-10-CM | POA: Insufficient documentation

## 2018-03-30 DIAGNOSIS — N201 Calculus of ureter: Secondary | ICD-10-CM | POA: Diagnosis not present

## 2018-03-30 DIAGNOSIS — I10 Essential (primary) hypertension: Secondary | ICD-10-CM | POA: Insufficient documentation

## 2018-03-30 DIAGNOSIS — I480 Paroxysmal atrial fibrillation: Secondary | ICD-10-CM | POA: Insufficient documentation

## 2018-03-30 HISTORY — PX: CYSTOSCOPY W/ RETROGRADES: SHX1426

## 2018-03-30 HISTORY — PX: CYSTOSCOPY/URETEROSCOPY/HOLMIUM LASER/STENT PLACEMENT: SHX6546

## 2018-03-30 LAB — GLUCOSE, CAPILLARY
GLUCOSE-CAPILLARY: 191 mg/dL — AB (ref 70–99)
Glucose-Capillary: 193 mg/dL — ABNORMAL HIGH (ref 70–99)

## 2018-03-30 SURGERY — CYSTOSCOPY/URETEROSCOPY/HOLMIUM LASER/STENT PLACEMENT
Anesthesia: General | Site: Ureter | Laterality: Right

## 2018-03-30 MED ORDER — FENTANYL CITRATE (PF) 100 MCG/2ML IJ SOLN
INTRAMUSCULAR | Status: AC
  Filled 2018-03-30: qty 2

## 2018-03-30 MED ORDER — SODIUM CHLORIDE 0.9 % IV SOLN
INTRAVENOUS | Status: DC
  Administered 2018-03-30: 09:00:00 via INTRAVENOUS

## 2018-03-30 MED ORDER — DEXAMETHASONE SODIUM PHOSPHATE 10 MG/ML IJ SOLN
INTRAMUSCULAR | Status: AC
  Filled 2018-03-30: qty 1

## 2018-03-30 MED ORDER — ONDANSETRON HCL 4 MG/2ML IJ SOLN
INTRAMUSCULAR | Status: AC
  Filled 2018-03-30: qty 2

## 2018-03-30 MED ORDER — ETOMIDATE 2 MG/ML IV SOLN
INTRAVENOUS | Status: AC
  Filled 2018-03-30: qty 10

## 2018-03-30 MED ORDER — ROCURONIUM BROMIDE 50 MG/5ML IV SOLN
INTRAVENOUS | Status: AC
  Filled 2018-03-30: qty 1

## 2018-03-30 MED ORDER — ONDANSETRON HCL 4 MG/2ML IJ SOLN
INTRAMUSCULAR | Status: DC | PRN
  Administered 2018-03-30: 4 mg via INTRAVENOUS

## 2018-03-30 MED ORDER — ETOMIDATE 2 MG/ML IV SOLN
INTRAVENOUS | Status: DC | PRN
  Administered 2018-03-30: 20 mg via INTRAVENOUS

## 2018-03-30 MED ORDER — HYDROCODONE-ACETAMINOPHEN 5-325 MG PO TABS: 1.0000 | ORAL_TABLET | ORAL | 0 refills | Status: AC | PRN

## 2018-03-30 MED ORDER — EPHEDRINE SULFATE 50 MG/ML IJ SOLN
INTRAMUSCULAR | Status: DC | PRN
  Administered 2018-03-30 (×5): 5 mg via INTRAVENOUS

## 2018-03-30 MED ORDER — EPHEDRINE SULFATE 50 MG/ML IJ SOLN
INTRAMUSCULAR | Status: AC
  Filled 2018-03-30: qty 1

## 2018-03-30 MED ORDER — ROCURONIUM BROMIDE 100 MG/10ML IV SOLN
INTRAVENOUS | Status: DC | PRN
  Administered 2018-03-30: 10 mg via INTRAVENOUS
  Administered 2018-03-30: 30 mg via INTRAVENOUS

## 2018-03-30 MED ORDER — IOPAMIDOL (ISOVUE-M 200) INJECTION 41%
INTRAMUSCULAR | Status: DC | PRN
  Administered 2018-03-30: 20 mL

## 2018-03-30 MED ORDER — NITROFURANTOIN MACROCRYSTAL 100 MG PO CAPS: 100.0000 mg | ORAL_CAPSULE | Freq: Two times a day (BID) | ORAL | 0 refills | Status: AC

## 2018-03-30 MED ORDER — MIDAZOLAM HCL 2 MG/2ML IJ SOLN
INTRAMUSCULAR | Status: DC | PRN
  Administered 2018-03-30: 1 mg via INTRAVENOUS

## 2018-03-30 MED ORDER — SUGAMMADEX SODIUM 200 MG/2ML IV SOLN
INTRAVENOUS | Status: DC | PRN
  Administered 2018-03-30: 200 mg via INTRAVENOUS

## 2018-03-30 MED ORDER — SUGAMMADEX SODIUM 200 MG/2ML IV SOLN
INTRAVENOUS | Status: AC
  Filled 2018-03-30: qty 2

## 2018-03-30 MED ORDER — MIDAZOLAM HCL 2 MG/2ML IJ SOLN
INTRAMUSCULAR | Status: AC
  Filled 2018-03-30: qty 2

## 2018-03-30 MED ORDER — DEXAMETHASONE SODIUM PHOSPHATE 10 MG/ML IJ SOLN
INTRAMUSCULAR | Status: DC | PRN
  Administered 2018-03-30: 5 mg via INTRAVENOUS

## 2018-03-30 MED ORDER — FENTANYL CITRATE (PF) 100 MCG/2ML IJ SOLN
INTRAMUSCULAR | Status: DC | PRN
  Administered 2018-03-30: 50 ug via INTRAVENOUS

## 2018-03-30 MED ORDER — PROPOFOL 10 MG/ML IV BOLUS
INTRAVENOUS | Status: AC
  Filled 2018-03-30: qty 20

## 2018-03-30 MED ORDER — LIDOCAINE HCL (CARDIAC) PF 100 MG/5ML IV SOSY
PREFILLED_SYRINGE | INTRAVENOUS | Status: DC | PRN
  Administered 2018-03-30: 100 mg via INTRAVENOUS

## 2018-03-30 MED ORDER — FENTANYL CITRATE (PF) 100 MCG/2ML IJ SOLN: 25.0000 ug | INTRAMUSCULAR | Status: DC | PRN

## 2018-03-30 SURGICAL SUPPLY — 31 items
BAG DRAIN CYSTO-URO LG1000N (MISCELLANEOUS) ×3 IMPLANT
BRUSH SCRUB EZ 1% IODOPHOR (MISCELLANEOUS) ×3 IMPLANT
BULB IRRIG PATHFIND (MISCELLANEOUS) IMPLANT
CATH URETL 5X70 OPEN END (CATHETERS) IMPLANT
CNTNR SPEC 2.5X3XGRAD LEK (MISCELLANEOUS)
CONT SPEC 4OZ STER OR WHT (MISCELLANEOUS)
CONTAINER SPEC 2.5X3XGRAD LEK (MISCELLANEOUS) IMPLANT
DRAPE UTILITY 15X26 TOWEL STRL (DRAPES) ×3 IMPLANT
FIBER LASER LITHO 273 (Laser) ×3 IMPLANT
GLOVE BIOGEL PI IND STRL 7.5 (GLOVE) ×1 IMPLANT
GLOVE BIOGEL PI INDICATOR 7.5 (GLOVE) ×2
GOWN STRL REUS W/ TWL LRG LVL3 (GOWN DISPOSABLE) ×1 IMPLANT
GOWN STRL REUS W/ TWL XL LVL3 (GOWN DISPOSABLE) ×1 IMPLANT
GOWN STRL REUS W/TWL LRG LVL3 (GOWN DISPOSABLE) ×2
GOWN STRL REUS W/TWL XL LVL3 (GOWN DISPOSABLE) ×2
GUIDEWIRE STR DUAL SENSOR (WIRE) ×3 IMPLANT
INFUSOR MANOMETER BAG 3000ML (MISCELLANEOUS) ×3 IMPLANT
INTRODUCER DILATOR DOUBLE (INTRODUCER) IMPLANT
KIT TURNOVER CYSTO (KITS) ×3 IMPLANT
PACK CYSTO AR (MISCELLANEOUS) ×3 IMPLANT
SET CYSTO W/LG BORE CLAMP LF (SET/KITS/TRAYS/PACK) ×3 IMPLANT
SHEATH URETERAL 12FRX35CM (MISCELLANEOUS) IMPLANT
SOL .9 NS 3000ML IRR  AL (IV SOLUTION) ×2
SOL .9 NS 3000ML IRR UROMATIC (IV SOLUTION) ×1 IMPLANT
STENT URET 6FRX24 CONTOUR (STENTS) IMPLANT
STENT URET 6FRX26 CONTOUR (STENTS) ×3 IMPLANT
SURGILUBE 2OZ TUBE FLIPTOP (MISCELLANEOUS) ×3 IMPLANT
SYR 10ML LL (SYRINGE) ×3 IMPLANT
TUBING ART PRESS 48 MALE/FEM (TUBING) IMPLANT
VALVE UROSEAL ADJ ENDO (VALVE) ×3 IMPLANT
WATER STERILE IRR 1000ML POUR (IV SOLUTION) ×3 IMPLANT

## 2018-03-30 NOTE — Anesthesia Procedure Notes (Signed)
Procedure Name: Intubation Date/Time: 03/30/2018 9:13 AM Performed by: Lavone Orn, CRNA Pre-anesthesia Checklist: Patient identified, Emergency Drugs available, Suction available, Patient being monitored and Timeout performed Patient Re-evaluated:Patient Re-evaluated prior to induction Oxygen Delivery Method: Circle system utilized Preoxygenation: Pre-oxygenation with 100% oxygen Induction Type: IV induction Ventilation: Mask ventilation without difficulty and Oral airway inserted - appropriate to patient size Laryngoscope Size: Mac and 3 Grade View: Grade I Tube type: Oral Number of attempts: 1 Airway Equipment and Method: Stylet Placement Confirmation: ETT inserted through vocal cords under direct vision,  positive ETCO2 and breath sounds checked- equal and bilateral Secured at: 19 cm Tube secured with: Tape Dental Injury: Teeth and Oropharynx as per pre-operative assessment

## 2018-03-30 NOTE — Telephone Encounter (Signed)
-----   Message from Sondra Come, MD sent at 03/30/2018  9:54 AM EDT ----- Regarding: follow up Please schedule follow up in 3 months with kub.  Legrand Rams, MD 03/30/2018

## 2018-03-30 NOTE — Anesthesia Post-op Follow-up Note (Signed)
Anesthesia QCDR form completed.        

## 2018-03-30 NOTE — Transfer of Care (Signed)
Immediate Anesthesia Transfer of Care Note  Patient: Kristina Archer  Procedure(s) Performed: CYSTOSCOPY/URETEROSCOPY/HOLMIUM LASER/STENT EXCHANGE (Right Ureter) CYSTOSCOPY WITH RETROGRADE PYELOGRAM (Right Ureter)  Patient Location: PACU  Anesthesia Type:General  Level of Consciousness: awake  Airway & Oxygen Therapy: Patient Spontanous Breathing and Patient connected to face mask oxygen  Post-op Assessment: Report given to RN and Post -op Vital signs reviewed and stable  Post vital signs: stable  Last Vitals:  Vitals Value Taken Time  BP 164/82 03/30/2018 10:08 AM  Temp    Pulse 67 03/30/2018 10:15 AM  Resp 14 03/30/2018 10:15 AM  SpO2 96 % 03/30/2018 10:15 AM  Vitals shown include unvalidated device data.  Last Pain:  Vitals:   03/30/18 0808  TempSrc: Temporal  PainSc: 0-No pain         Complications: No apparent anesthesia complications

## 2018-03-30 NOTE — Telephone Encounter (Signed)
App made and mailed to patient ° °Lm to cb to confirm ° °Michelle °

## 2018-03-30 NOTE — Discharge Instructions (Addendum)
  AMBULATORY SURGERY  DISCHARGE INSTRUCTIONS   1) The drugs that you were given will stay in your system until tomorrow so for the next 24 hours you should not:  A) Drive an automobile B) Make any legal decisions C) Drink any alcoholic beverage   2) You may resume regular meals tomorrow.  Today it is better to start with liquids and gradually work up to solid foods.  You may eat anything you prefer, but it is better to start with liquids, then soup and crackers, and gradually work up to solid foods.   3) Please notify your doctor immediately if you have any unusual bleeding, trouble breathing, redness and pain at the surgery site, drainage, fever, or pain not relieved by medication.    4) Additional Instructions: TAKE A STOOL SOFTENER TWICE A DAY WHILE TAKING NARCOTIC PAIN MEDICINE TO PREVENT CONSTIPATION   Please contact your physician with any problems or Same Day Surgery at [phone], Monday through Friday 6 am to 4 pm, or [facility] Main number at [phone].   

## 2018-03-30 NOTE — H&P (Addendum)
UROLOGY H&P UPDATE  Agree with prior H&P dated 03/02/2018 by Dr. Lonna Cobb. 73 yo co-morbid female with long history of recurrent nephrolithiasis and UTIs. On 2/21 she underwent urgent right ureteral stent placement for 56mm distal ureteral stone with infection. Presents today for definitive management.  She reports a left labial lesion that is draining purulent fluid.  We will plan for examination under anesthesia.  We discussed possible incision and drainage of this lesion and the risks and benefits.  Cardiac: RRR Lungs: CTA bilaterally  Laterality: right Procedure: Right URS, LL, stent  Urine: urine cx 3/12 multiple species. Has been on 5 days nitrofurantoin which is appropriate for her prior resistant E Coli.   Will use fluconazole and Zosyn for pre-op abx based on prior cx's.  Informed consent obtained, we specifically discussed the risks of bleeding, infection, post-operative pain, need for additional procedures, stent related symptoms, need for stent removal, and possible incision and drainage of labial abscess.  Sondra Come, MD 03/30/2018

## 2018-03-30 NOTE — Op Note (Signed)
Date of procedure: 03/30/18  Preoperative diagnosis:  1. Right distal ureteral stone, 1.3 cm  Postoperative diagnosis:  1. Same  Procedure: 1. Cystoscopy, right ureteroscopy, laser lithotripsy, right retrograde pyelogram with intraoperative interpretation, right ureteral stent placement on Dangler  Surgeon: Legrand Rams, MD  Anesthesia: General  Complications: None  Intraoperative findings:  1.  Indwelling right ureteral stent encrusted 2.  Uncomplicated dusting of right 1 cm distal ureteral stone, fragments irrigated free from ureter 3.  Right flexible pyeloscopy demonstrated no residual renal or ureteral stones 4.  Retrograde pyelogram with no filling defects 5.  Uncomplicated right ureteral stent placement on Dangler  EBL: None  Specimens: None  Drains: Right 6 French by 26 cm ureteral stent on Dangler  Indication: Kristina Archer is a 73 y.o. patient with long history of nephrolithiasis and recurrent UTIs.  She was previously admitted with a 1.4 cm right ureteral stone with infection, and underwent urgent ureteral stent placement.  She presents today for definitive management.  After reviewing the management options for treatment, they elected to proceed with the above surgical procedure(s). We have discussed the potential benefits and risks of the procedure, side effects of the proposed treatment, the likelihood of the patient achieving the goals of the procedure, and any potential problems that might occur during the procedure or recuperation. Informed consent has been obtained.  Description of procedure:  The patient was taken to the operating room and general anesthesia was induced. SCDs were placed for DVT prophylaxis.The patient was placed in the dorsal lithotomy position, prepped and draped in the usual sterile fashion, and preoperative antibiotics(Zosyn and Diflucan) were administered. A preoperative time-out was performed.   A 21 French rigid cystoscope with a 30  degree lens was used to intubate the urethra.  The right indwelling ureteral stent was encrusted.  This was grasped and pulled to the meatus.  A sensor wire was advanced through the stent up to the collecting system under fluoroscopic vision, and the stent removed.  A semirigid ureteroscope was advanced alongside the wire and a 1 cm right distal ureteral stone was identified.  This was fragmented to dust with a 273 m laser fiber on settings of 0.3 J and 20 Hz.  All fragments were irrigated free from the ureter.  With her history of recurrent stone disease, I elected to perform flexible ureteroscopy as well to rule out any residual fragments.  A second sensor wire was added through the semirigid ureteroscope.  The flexible ureteroscope was then advanced over 1 of the wires into the collecting system.  Thorough pyeloscopy revealed no residual stone fragments or debris.  Contrast was injected to aid in stent placement and showed no filling defects.  Careful pullback ureteroscopy revealed no residual ureteral fragments.  There was no ureteral injury or significant edema.  A 6 French by 26 cm ureteral stent was then advanced over the wire fluoroscopically and the wire removed.  There was an excellent curl in the renal pelvis, as well as in the bladder.  The Dangler was left attached, the patient will remove this on Monday 3/23.  The bladder was drained and this concluded our procedure.  Disposition: Stable to PACU  Plan: Continue nitrofurantoin x3 days Stent removal at home on Monday 3/23 Stone prevention strategies discussed with family RTC 3 months with KUB  Legrand Rams, MD

## 2018-03-30 NOTE — Anesthesia Postprocedure Evaluation (Signed)
Anesthesia Post Note  Patient: Kristina Archer  Procedure(s) Performed: CYSTOSCOPY/URETEROSCOPY/HOLMIUM LASER/STENT EXCHANGE (Right Ureter) CYSTOSCOPY WITH RETROGRADE PYELOGRAM (Right Ureter)  Patient location during evaluation: PACU Anesthesia Type: General Level of consciousness: awake and alert Pain management: pain level controlled Vital Signs Assessment: post-procedure vital signs reviewed and stable Respiratory status: spontaneous breathing, nonlabored ventilation, respiratory function stable and patient connected to nasal cannula oxygen Cardiovascular status: blood pressure returned to baseline and stable Postop Assessment: no apparent nausea or vomiting Anesthetic complications: no     Last Vitals:  Vitals:   03/30/18 1104 03/30/18 1132  BP: (!) 142/65 (!) 132/58  Pulse: 65 63  Resp: 16 14  Temp: 36.6 C   SpO2: 93% 93%    Last Pain:  Vitals:   03/30/18 1132  TempSrc:   PainSc: 0-No pain                 Cleda Mccreedy Piscitello

## 2018-03-30 NOTE — Anesthesia Preprocedure Evaluation (Signed)
Anesthesia Evaluation  Patient identified by MRN, date of birth, ID band Patient awake    Reviewed: Allergy & Precautions, H&P , NPO status , Patient's Chart, lab work & pertinent test results  History of Anesthesia Complications Negative for: history of anesthetic complications  Airway Mallampati: III  TM Distance: <3 FB Neck ROM: limited    Dental  (+) Poor Dentition, Missing, Upper Dentures, Lower Dentures, Dental Advidsory Given   Pulmonary neg shortness of breath, pneumonia, former smoker,           Cardiovascular Exercise Tolerance: Good hypertension, (-) angina+CHF  + dysrhythmias Atrial Fibrillation      Neuro/Psych PSYCHIATRIC DISORDERS Anxiety  Neuromuscular disease    GI/Hepatic Neg liver ROS, GERD  Medicated and Controlled,  Endo/Other  diabetes, Type 2  Renal/GU Renal disease (kidney stones)     Musculoskeletal  (+) Arthritis ,   Abdominal   Peds  Hematology negative hematology ROS (+)   Anesthesia Other Findings Past Medical History: No date: Anxiety No date: Cardiomyopathy [facility])     Comment:  a. 10/2017 Echo: EF 20-25%, diff HK, mild MR. Nl RV               size. No date: DDD (degenerative disc disease), lumbar No date: Diverticulosis No date: Elevated troponin No date: Essential hypertension No date: GERD (gastroesophageal reflux disease) No date: History of kidney stones No date: Hyperlipidemia No date: LBBB (left bundle branch block) No date: PAF (paroxysmal atrial fibrillation) (HCC)     Comment:  a.  Diagnosed 10/2017; b.  On Eliquis and amiodarone; c.              CHADS2VASc => 5 (CHF, HTN, age x 1, DM, female) No date: Pneumonia No date: Sciatica No date: Type II diabetes mellitus (HCC)     Reproductive/Obstetrics negative OB ROS                             Anesthesia Physical  Anesthesia Plan  ASA: IV  Anesthesia Plan: General ETT   Post-op Pain  Management:    Induction: Intravenous  PONV Risk Score and Plan: Ondansetron, Dexamethasone and Treatment may vary due to age or medical condition  Airway Management Planned: Oral ETT  Additional Equipment:   Intra-op Plan:   Post-operative Plan: Extubation in OR and Possible Post-op intubation/ventilation  Informed Consent: I have reviewed the patients History and Physical, chart, labs and discussed the procedure including the risks, benefits and alternatives for the proposed anesthesia with the patient or authorized representative who has indicated his/her understanding and acceptance.     Dental Advisory Given  Plan Discussed with: Anesthesiologist, CRNA and Surgeon  Anesthesia Plan Comments: (Patient has cardiac clearance for this procedure.   Patient informed that they are higher risk for complications from anesthesia during this procedure due to their medical history.  Patient voiced understanding.  Patient consented for risks of anesthesia including but not limited to:  - adverse reactions to medications - damage to teeth, lips or other oral mucosa - sore throat or hoarseness - Damage to heart, brain, lungs or loss of life)        Anesthesia Quick Evaluation

## 2018-03-31 ENCOUNTER — Other Ambulatory Visit: Payer: Self-pay | Admitting: Physician Assistant

## 2018-04-01 ENCOUNTER — Encounter: Payer: Self-pay | Admitting: Urology

## 2018-04-03 ENCOUNTER — Telehealth: Payer: Self-pay | Admitting: Urology

## 2018-04-03 ENCOUNTER — Ambulatory Visit: Payer: Medicare Other | Admitting: Urology

## 2018-04-03 NOTE — Telephone Encounter (Signed)
Pt Kristina Archer and states that she is having Left sided pain. Please advise

## 2018-04-03 NOTE — Telephone Encounter (Signed)
Attempted to call and line was busy.

## 2018-04-03 NOTE — Telephone Encounter (Signed)
Spoke to patient and she states the left side pain comes and goes. The image from 03/01/18 showed a 58mm stone that will pass. She has flomax and hydrocodone. I informed patient to take both the medications and she should pass the stone.

## 2018-04-05 ENCOUNTER — Telehealth: Payer: Self-pay | Admitting: Urology

## 2018-04-05 ENCOUNTER — Telehealth: Payer: Self-pay

## 2018-04-05 NOTE — Telephone Encounter (Signed)
Spoke with patient and she was notified that the blood in her urine is most likely due to her stent. Patient was to remove stent on string Monday or Tuesday this week per Dr. Richardo Hanks. Patient states she was very nervous and had not yet removed it due to this. Patient was instructed to put phone on speaker and she was given verbal instruction on how to remove the stent. Patient was able to remove stent while on the phone and tolerated well. She was instructed to increase water intake and that she may still see blood today or tomorrow and may have some burning due to the stent removal

## 2018-04-05 NOTE — Telephone Encounter (Signed)
Pt LMOM that she is bleeding.  She said it could be from the stent, but it's more like she's having a period.

## 2018-04-05 NOTE — Telephone Encounter (Signed)
Pt called by [place], CMA on 3/24. Pt refusing tele visit. Does not have mychart or email.   I attempted to call patient for COVID screening.   No answer. LMOM.

## 2018-04-06 ENCOUNTER — Ambulatory Visit: Payer: Medicare Other | Admitting: Cardiovascular Disease

## 2018-04-06 ENCOUNTER — Ambulatory Visit: Payer: Medicare Other | Admitting: Physician Assistant

## 2018-04-06 NOTE — Telephone Encounter (Signed)
Spoke with patient to confirm her appointment today here in our office. She states that she does not want to see a cardiologist at this time because she is not well and recovering from her recent procedure. Reviewed that we had her down to come in but she insists that she is not willing to see another doctor right now with all of the other stuff she has going on. Advised that I would make providers aware and cancel appointment at this time.

## 2018-04-16 ENCOUNTER — Telehealth: Payer: Self-pay | Admitting: Urology

## 2018-04-16 NOTE — Telephone Encounter (Signed)
Patient called and is asking for a refill on pain medication. She states that she is almost out and wants more.    Kristina Archer

## 2018-04-16 NOTE — Telephone Encounter (Signed)
Called pt she states that she is having significant back pain. Pain is located in the lumbar region of the back. Patient denies dysuria, fever, or chills. Pt notes that she has arthritis but has not been taking her usual tylenol arthritis due to fear. Advised pt that stent related pain has typically subsided by this point, pt agrees that this pain feels different than stone pain. Advised pt to seek care from PCP regarding arthritis. Call us back if sx change or worsen.

## 2018-04-22 ENCOUNTER — Emergency Department: Payer: Medicare Other

## 2018-04-22 ENCOUNTER — Inpatient Hospital Stay
Admission: EM | Admit: 2018-04-22 | Discharge: 2018-04-25 | DRG: 871 | Disposition: A | Discharge status: Home Health | Payer: Medicare Other | Attending: Internal Medicine | Admitting: Internal Medicine

## 2018-04-22 ENCOUNTER — Other Ambulatory Visit: Payer: Self-pay

## 2018-04-22 DIAGNOSIS — J810 Acute pulmonary edema: Secondary | ICD-10-CM | POA: Diagnosis not present

## 2018-04-22 DIAGNOSIS — E785 Hyperlipidemia, unspecified: Secondary | ICD-10-CM | POA: Diagnosis present

## 2018-04-22 DIAGNOSIS — Z87891 Personal history of nicotine dependence: Secondary | ICD-10-CM

## 2018-04-22 DIAGNOSIS — I5043 Acute on chronic combined systolic (congestive) and diastolic (congestive) heart failure: Secondary | ICD-10-CM | POA: Diagnosis present

## 2018-04-22 DIAGNOSIS — J9601 Acute respiratory failure with hypoxia: Secondary | ICD-10-CM | POA: Diagnosis present

## 2018-04-22 DIAGNOSIS — Z79899 Other long term (current) drug therapy: Secondary | ICD-10-CM | POA: Diagnosis not present

## 2018-04-22 DIAGNOSIS — J9612 Chronic respiratory failure with hypercapnia: Secondary | ICD-10-CM

## 2018-04-22 DIAGNOSIS — J9611 Chronic respiratory failure with hypoxia: Secondary | ICD-10-CM

## 2018-04-22 DIAGNOSIS — Z7901 Long term (current) use of anticoagulants: Secondary | ICD-10-CM

## 2018-04-22 DIAGNOSIS — Z79891 Long term (current) use of opiate analgesic: Secondary | ICD-10-CM | POA: Diagnosis not present

## 2018-04-22 DIAGNOSIS — I447 Left bundle-branch block, unspecified: Secondary | ICD-10-CM | POA: Diagnosis present

## 2018-04-22 DIAGNOSIS — M545 Low back pain: Secondary | ICD-10-CM | POA: Diagnosis present

## 2018-04-22 DIAGNOSIS — F419 Anxiety disorder, unspecified: Secondary | ICD-10-CM | POA: Diagnosis present

## 2018-04-22 DIAGNOSIS — D649 Anemia, unspecified: Secondary | ICD-10-CM | POA: Diagnosis present

## 2018-04-22 DIAGNOSIS — I480 Paroxysmal atrial fibrillation: Secondary | ICD-10-CM | POA: Diagnosis present

## 2018-04-22 DIAGNOSIS — J189 Pneumonia, unspecified organism: Secondary | ICD-10-CM | POA: Diagnosis present

## 2018-04-22 DIAGNOSIS — D696 Thrombocytopenia, unspecified: Secondary | ICD-10-CM | POA: Diagnosis present

## 2018-04-22 DIAGNOSIS — Z7189 Other specified counseling: Secondary | ICD-10-CM | POA: Diagnosis not present

## 2018-04-22 DIAGNOSIS — R6520 Severe sepsis without septic shock: Secondary | ICD-10-CM | POA: Diagnosis present

## 2018-04-22 DIAGNOSIS — Z7984 Long term (current) use of oral hypoglycemic drugs: Secondary | ICD-10-CM

## 2018-04-22 DIAGNOSIS — E1165 Type 2 diabetes mellitus with hyperglycemia: Secondary | ICD-10-CM | POA: Diagnosis present

## 2018-04-22 DIAGNOSIS — A419 Sepsis, unspecified organism: Secondary | ICD-10-CM | POA: Diagnosis present

## 2018-04-22 DIAGNOSIS — Z90710 Acquired absence of both cervix and uterus: Secondary | ICD-10-CM | POA: Diagnosis not present

## 2018-04-22 DIAGNOSIS — Z9911 Dependence on respirator [ventilator] status: Secondary | ICD-10-CM | POA: Diagnosis not present

## 2018-04-22 DIAGNOSIS — J9690 Respiratory failure, unspecified, unspecified whether with hypoxia or hypercapnia: Secondary | ICD-10-CM

## 2018-04-22 DIAGNOSIS — I110 Hypertensive heart disease with heart failure: Secondary | ICD-10-CM | POA: Diagnosis present

## 2018-04-22 DIAGNOSIS — G8929 Other chronic pain: Secondary | ICD-10-CM | POA: Diagnosis present

## 2018-04-22 DIAGNOSIS — Z9049 Acquired absence of other specified parts of digestive tract: Secondary | ICD-10-CM | POA: Diagnosis not present

## 2018-04-22 DIAGNOSIS — Z20828 Contact with and (suspected) exposure to other viral communicable diseases: Secondary | ICD-10-CM | POA: Diagnosis present

## 2018-04-22 DIAGNOSIS — Z885 Allergy status to narcotic agent status: Secondary | ICD-10-CM

## 2018-04-22 DIAGNOSIS — I420 Dilated cardiomyopathy: Secondary | ICD-10-CM | POA: Diagnosis present

## 2018-04-22 DIAGNOSIS — K219 Gastro-esophageal reflux disease without esophagitis: Secondary | ICD-10-CM | POA: Diagnosis present

## 2018-04-22 DIAGNOSIS — Z8249 Family history of ischemic heart disease and other diseases of the circulatory system: Secondary | ICD-10-CM | POA: Diagnosis not present

## 2018-04-22 DIAGNOSIS — Z87442 Personal history of urinary calculi: Secondary | ICD-10-CM

## 2018-04-22 DIAGNOSIS — Z515 Encounter for palliative care: Secondary | ICD-10-CM | POA: Diagnosis not present

## 2018-04-22 LAB — RESPIRATORY PANEL BY PCR

## 2018-04-22 LAB — HEPATIC FUNCTION PANEL
ALT: 18 U/L (ref 0–44)
AST: 25 U/L (ref 15–41)
Albumin: 3.6 g/dL (ref 3.5–5.0)
Alkaline Phosphatase: 87 U/L (ref 38–126)
Bilirubin, Direct: 0.2 mg/dL (ref 0.0–0.2)
Indirect Bilirubin: 0.8 mg/dL (ref 0.3–0.9)
Total Bilirubin: 1 mg/dL (ref 0.3–1.2)
Total Protein: 7.5 g/dL (ref 6.5–8.1)

## 2018-04-22 LAB — URINALYSIS, ROUTINE W REFLEX MICROSCOPIC
Bilirubin Urine: NEGATIVE
Glucose, UA: 50 mg/dL — AB
Ketones, ur: NEGATIVE mg/dL
Nitrite: NEGATIVE
Protein, ur: 100 mg/dL — AB
Specific Gravity, Urine: 1.014 (ref 1.005–1.030)
Squamous Epithelial / HPF: NONE SEEN (ref 0–5)
WBC, UA: >50 WBC/hpf — ABNORMAL HIGH (ref 0–5)
pH: 5 (ref 5.0–8.0)

## 2018-04-22 LAB — BLOOD GAS, ARTERIAL
Acid-Base Excess: 0.5 mmol/L (ref 0.0–2.0)
Bicarbonate: 25.4 mmol/L (ref 20.0–28.0)
FIO2: 1
MECHVT: 450 mL
O2 Saturation: 99.7 %
PEEP: 5 cmH2O
Patient temperature: 37
RATE: 20 resp/min
pCO2 arterial: 41 mmHg (ref 32.0–48.0)
pH, Arterial: 7.4 (ref 7.350–7.450)
pO2, Arterial: 204 mmHg — ABNORMAL HIGH (ref 83.0–108.0)

## 2018-04-22 LAB — BASIC METABOLIC PANEL
Anion gap: 9 (ref 5–15)
BUN: 13 mg/dL (ref 8–23)
CO2: 24 mmol/L (ref 22–32)
Calcium: 10 mg/dL (ref 8.9–10.3)
Chloride: 104 mmol/L (ref 98–111)
Creatinine, Ser: 1.05 mg/dL — ABNORMAL HIGH (ref 0.44–1.00)
GFR calc Af Amer: >60 mL/min (ref >60)
GFR calc non Af Amer: 54 mL/min — ABNORMAL LOW (ref >60)
Glucose, Bld: 219 mg/dL — ABNORMAL HIGH (ref 70–99)
Potassium: 3.5 mmol/L (ref 3.5–5.1)
Sodium: 137 mmol/L (ref 135–145)

## 2018-04-22 LAB — GLUCOSE, CAPILLARY
Glucose-Capillary: 111 mg/dL — ABNORMAL HIGH (ref 70–99)
Glucose-Capillary: 114 mg/dL — ABNORMAL HIGH (ref 70–99)
Glucose-Capillary: 125 mg/dL — ABNORMAL HIGH (ref 70–99)
Glucose-Capillary: 205 mg/dL — ABNORMAL HIGH (ref 70–99)
Glucose-Capillary: 237 mg/dL — ABNORMAL HIGH (ref 70–99)

## 2018-04-22 LAB — MRSA PCR SCREENING: MRSA by PCR: NEGATIVE

## 2018-04-22 LAB — CBC WITH DIFFERENTIAL/PLATELET
Abs Immature Granulocytes: 0.05 10*3/uL (ref 0.00–0.07)
Basophils Absolute: 0.1 10*3/uL (ref 0.0–0.1)
Basophils Relative: 1 %
Eosinophils Absolute: 0 10*3/uL (ref 0.0–0.5)
Eosinophils Relative: 0 %
HCT: 35.9 % — ABNORMAL LOW (ref 36.0–46.0)
Hemoglobin: 11.7 g/dL — ABNORMAL LOW (ref 12.0–15.0)
Immature Granulocytes: 1 %
Lymphocytes Relative: 13 %
Lymphs Abs: 1.4 10*3/uL (ref 0.7–4.0)
MCH: 26.4 pg (ref 26.0–34.0)
MCHC: 32.6 g/dL (ref 30.0–36.0)
MCV: 80.9 fL (ref 80.0–100.0)
Monocytes Absolute: 0.8 10*3/uL (ref 0.1–1.0)
Monocytes Relative: 7 %
Neutro Abs: 8.7 10*3/uL — ABNORMAL HIGH (ref 1.7–7.7)
Neutrophils Relative %: 78 %
Platelets: 120 10*3/uL — ABNORMAL LOW (ref 150–400)
RBC: 4.44 MIL/uL (ref 3.87–5.11)
RDW: 16.4 % — ABNORMAL HIGH (ref 11.5–15.5)
WBC: 11 10*3/uL — ABNORMAL HIGH (ref 4.0–10.5)
nRBC: 0 % (ref 0.0–0.2)

## 2018-04-22 LAB — STREP PNEUMONIAE URINARY ANTIGEN: Strep Pneumo Urinary Antigen: NEGATIVE

## 2018-04-22 LAB — CBC
HCT: 38.3 % (ref 36.0–46.0)
Hemoglobin: 12.3 g/dL (ref 12.0–15.0)
MCH: 26.6 pg (ref 26.0–34.0)
MCHC: 32.1 g/dL (ref 30.0–36.0)
MCV: 82.7 fL (ref 80.0–100.0)
Platelets: 181 10*3/uL (ref 150–400)
RBC: 4.63 MIL/uL (ref 3.87–5.11)
RDW: 16.3 % — ABNORMAL HIGH (ref 11.5–15.5)
WBC: 15.7 10*3/uL — ABNORMAL HIGH (ref 4.0–10.5)
nRBC: 0 % (ref 0.0–0.2)

## 2018-04-22 LAB — PROCALCITONIN: Procalcitonin: <0.1 ng/mL

## 2018-04-22 LAB — BRAIN NATRIURETIC PEPTIDE: B Natriuretic Peptide: 1504 pg/mL — ABNORMAL HIGH (ref 0.0–100.0)

## 2018-04-22 LAB — LACTIC ACID, PLASMA
Lactic Acid, Venous: 1.6 mmol/L (ref 0.5–1.9)
Lactic Acid, Venous: 1.5 mmol/L (ref 0.5–1.9)

## 2018-04-22 LAB — TROPONIN I: Troponin I: 0.06 ng/mL (ref <0.03)

## 2018-04-22 MED ORDER — ONDANSETRON HCL 4 MG/2ML IJ SOLN: 4.0000 mg | Freq: Four times a day (QID) | INTRAMUSCULAR | Status: DC | PRN

## 2018-04-22 MED ORDER — ETOMIDATE 2 MG/ML IV SOLN
20.0000 mg | Freq: Once | INTRAVENOUS | Status: AC
  Administered 2018-04-22: 20 mg via INTRAVENOUS

## 2018-04-22 MED ORDER — SUCCINYLCHOLINE CHLORIDE 20 MG/ML IJ SOLN
120.0000 mg | Freq: Once | INTRAMUSCULAR | Status: AC
  Administered 2018-04-22: 120 mg via INTRAVENOUS

## 2018-04-22 MED ORDER — POLYETHYLENE GLYCOL 3350 17 G PO PACK: 17.0000 g | PACK | Freq: Every day | ORAL | Status: DC | PRN

## 2018-04-22 MED ORDER — METRONIDAZOLE IN NACL 5-0.79 MG/ML-% IV SOLN
500.0000 mg | Freq: Once | INTRAVENOUS | Status: AC
  Administered 2018-04-22: 500 mg via INTRAVENOUS
  Filled 2018-04-22: qty 100

## 2018-04-22 MED ORDER — FENTANYL CITRATE (PF) 100 MCG/2ML IJ SOLN: 25.0000 ug | INTRAMUSCULAR | Status: DC | PRN

## 2018-04-22 MED ORDER — SODIUM CHLORIDE 0.9 % IV SOLN
2.0000 g | Freq: Two times a day (BID) | INTRAVENOUS | Status: DC
  Administered 2018-04-22 – 2018-04-23 (×2): 2 g via INTRAVENOUS
  Filled 2018-04-22 (×3): qty 2

## 2018-04-22 MED ORDER — VANCOMYCIN HCL IN DEXTROSE 1-5 GM/200ML-% IV SOLN: 1000.0000 mg | Freq: Once | INTRAVENOUS | Status: DC

## 2018-04-22 MED ORDER — INSULIN ASPART 100 UNIT/ML ~~LOC~~ SOLN: 0.0000 [IU] | Freq: Three times a day (TID) | SUBCUTANEOUS | Status: DC

## 2018-04-22 MED ORDER — ROSUVASTATIN CALCIUM 10 MG PO TABS: 40.0000 mg | ORAL_TABLET | Freq: Every day | ORAL | Status: DC

## 2018-04-22 MED ORDER — PROPOFOL 1000 MG/100ML IV EMUL
0.0000 ug/kg/min | INTRAVENOUS | Status: DC
  Administered 2018-04-22 (×3): 45 ug/kg/min via INTRAVENOUS
  Administered 2018-04-22: 39.578 ug/kg/min via INTRAVENOUS
  Administered 2018-04-23 (×2): 40 ug/kg/min via INTRAVENOUS
  Filled 2018-04-22 (×6): qty 100

## 2018-04-22 MED ORDER — SODIUM CHLORIDE 0.9 % IV SOLN
2.0000 g | Freq: Once | INTRAVENOUS | Status: AC
  Administered 2018-04-22: 2 g via INTRAVENOUS
  Filled 2018-04-22: qty 2

## 2018-04-22 MED ORDER — PROPOFOL 1000 MG/100ML IV EMUL
INTRAVENOUS | Status: AC
  Administered 2018-04-22: 07:00:00 via INTRAVENOUS
  Filled 2018-04-22: qty 100

## 2018-04-22 MED ORDER — FUROSEMIDE 10 MG/ML IJ SOLN
60.0000 mg | Freq: Once | INTRAMUSCULAR | Status: AC
  Administered 2018-04-22: 60 mg via INTRAVENOUS
  Filled 2018-04-22: qty 8

## 2018-04-22 MED ORDER — CHLORHEXIDINE GLUCONATE 0.12% ORAL RINSE (MEDLINE KIT)
15.0000 mL | Freq: Two times a day (BID) | OROMUCOSAL | Status: DC
  Administered 2018-04-22 – 2018-04-23 (×3): 15 mL via OROMUCOSAL

## 2018-04-22 MED ORDER — ACETAMINOPHEN 325 MG PO TABS: 650.0000 mg | ORAL_TABLET | Freq: Four times a day (QID) | ORAL | Status: DC | PRN

## 2018-04-22 MED ORDER — FAMOTIDINE IN NACL 20-0.9 MG/50ML-% IV SOLN
20.0000 mg | INTRAVENOUS | Status: DC
  Administered 2018-04-22 – 2018-04-23 (×2): 20 mg via INTRAVENOUS
  Filled 2018-04-22 (×2): qty 50

## 2018-04-22 MED ORDER — APIXABAN 5 MG PO TABS
5.0000 mg | ORAL_TABLET | Freq: Two times a day (BID) | ORAL | Status: DC
  Administered 2018-04-22 – 2018-04-25 (×7): 5 mg
  Filled 2018-04-22 (×7): qty 1

## 2018-04-22 MED ORDER — ACETAMINOPHEN 325 MG PO TABS
650.0000 mg | ORAL_TABLET | Freq: Four times a day (QID) | ORAL | Status: DC | PRN
  Administered 2018-04-24: 650 mg
  Filled 2018-04-22: qty 2

## 2018-04-22 MED ORDER — AMIODARONE HCL 200 MG PO TABS
200.0000 mg | ORAL_TABLET | Freq: Every day | ORAL | Status: DC
  Administered 2018-04-23: 200 mg
  Filled 2018-04-22: qty 1

## 2018-04-22 MED ORDER — LISINOPRIL 5 MG PO TABS: 5.0000 mg | ORAL_TABLET | Freq: Every day | ORAL | Status: DC

## 2018-04-22 MED ORDER — ALBUTEROL SULFATE (2.5 MG/3ML) 0.083% IN NEBU
5.0000 mg | INHALATION_SOLUTION | Freq: Once | RESPIRATORY_TRACT | Status: DC
  Filled 2018-04-22: qty 6

## 2018-04-22 MED ORDER — ACETAMINOPHEN 650 MG RE SUPP: 650.0000 mg | Freq: Four times a day (QID) | RECTAL | Status: DC | PRN

## 2018-04-22 MED ORDER — INSULIN ASPART 100 UNIT/ML ~~LOC~~ SOLN
0.0000 [IU] | SUBCUTANEOUS | Status: DC
  Administered 2018-04-22: 2 [IU] via SUBCUTANEOUS
  Administered 2018-04-22: 5 [IU] via SUBCUTANEOUS
  Administered 2018-04-23: 3 [IU] via SUBCUTANEOUS
  Administered 2018-04-23: 2 [IU] via SUBCUTANEOUS
  Administered 2018-04-23: 3 [IU] via SUBCUTANEOUS
  Administered 2018-04-23 – 2018-04-24 (×3): 2 [IU] via SUBCUTANEOUS
  Administered 2018-04-25: 5 [IU] via SUBCUTANEOUS
  Filled 2018-04-22 (×10): qty 1

## 2018-04-22 MED ORDER — ORAL CARE MOUTH RINSE
15.0000 mL | OROMUCOSAL | Status: DC
  Administered 2018-04-22 – 2018-04-23 (×12): 15 mL via OROMUCOSAL

## 2018-04-22 MED ORDER — PAROXETINE HCL 20 MG PO TABS: 20.0000 mg | ORAL_TABLET | Freq: Every day | ORAL | Status: DC

## 2018-04-22 MED ORDER — CARVEDILOL 3.125 MG PO TABS
3.1250 mg | ORAL_TABLET | Freq: Two times a day (BID) | ORAL | Status: DC
  Administered 2018-04-22 – 2018-04-24 (×5): 3.125 mg via ORAL
  Filled 2018-04-22 (×6): qty 1

## 2018-04-22 MED ORDER — INSULIN ASPART 100 UNIT/ML ~~LOC~~ SOLN: 0.0000 [IU] | Freq: Every day | SUBCUTANEOUS | Status: DC

## 2018-04-22 MED ORDER — APIXABAN 5 MG PO TABS: 5.0000 mg | ORAL_TABLET | Freq: Two times a day (BID) | ORAL | Status: DC

## 2018-04-22 MED ORDER — ONDANSETRON HCL 4 MG PO TABS: 4.0000 mg | ORAL_TABLET | Freq: Four times a day (QID) | ORAL | Status: DC | PRN

## 2018-04-22 MED ORDER — AMIODARONE HCL 200 MG PO TABS: 200.0000 mg | ORAL_TABLET | Freq: Every day | ORAL | Status: DC

## 2018-04-22 MED ORDER — FUROSEMIDE 10 MG/ML IJ SOLN
40.0000 mg | Freq: Once | INTRAMUSCULAR | Status: AC
  Administered 2018-04-22: 40 mg via INTRAVENOUS
  Filled 2018-04-22: qty 4

## 2018-04-22 MED ORDER — TAMSULOSIN HCL 0.4 MG PO CAPS: 0.4000 mg | ORAL_CAPSULE | Freq: Every day | ORAL | Status: DC

## 2018-04-22 MED ORDER — ROSUVASTATIN CALCIUM 10 MG PO TABS
40.0000 mg | ORAL_TABLET | Freq: Every day | ORAL | Status: DC
  Administered 2018-04-23 – 2018-04-25 (×3): 40 mg
  Filled 2018-04-22 (×3): qty 4

## 2018-04-22 MED ORDER — PAROXETINE HCL 20 MG PO TABS
20.0000 mg | ORAL_TABLET | Freq: Every day | ORAL | Status: DC
  Administered 2018-04-22 – 2018-04-24 (×3): 20 mg
  Filled 2018-04-22 (×5): qty 1

## 2018-04-22 MED ORDER — VANCOMYCIN HCL 10 G IV SOLR
2000.0000 mg | Freq: Once | INTRAVENOUS | Status: AC
  Administered 2018-04-22: 2000 mg via INTRAVENOUS
  Filled 2018-04-22 (×2): qty 2000

## 2018-04-22 MED ORDER — PROPOFOL 1000 MG/100ML IV EMUL
5.0000 ug/kg/min | INTRAVENOUS | Status: DC
  Administered 2018-04-22: 07:00:00 via INTRAVENOUS

## 2018-04-22 MED ORDER — VANCOMYCIN HCL IN DEXTROSE 750-5 MG/150ML-% IV SOLN
750.0000 mg | INTRAVENOUS | Status: DC
  Filled 2018-04-22: qty 150

## 2018-04-22 MED ORDER — SODIUM CHLORIDE 0.9 % IV SOLN
500.0000 mg | INTRAVENOUS | Status: DC
  Administered 2018-04-22: 500 mg via INTRAVENOUS
  Filled 2018-04-22 (×2): qty 500

## 2018-04-22 MED ORDER — LISINOPRIL 5 MG PO TABS
5.0000 mg | ORAL_TABLET | Freq: Every day | ORAL | Status: DC
  Administered 2018-04-23: 5 mg
  Filled 2018-04-22: qty 1

## 2018-04-22 NOTE — Progress Notes (Signed)
Family Meeting Note  Advance Directive:no  Today a meeting took place with the son.  Patient is unable to participate due to: intubation and sedation  The following clinical team members were present during this meeting:MD  The following were discussed:Patient's diagnosis: acute hypoxic respiratory failure, Patient's progosis: Unable to determine and Goals for treatment: Continue present management and full code  Discussed code status with son over the phone. He states that he would like her to be full code and that he wants everything done at this time to save her life.  Additional follow-up to be provided: prn  Time spent during discussion:20 minutes  Hilton Sinclair, MD

## 2018-04-22 NOTE — Consult Note (Signed)
PCCM CONSULT NOTE  INITIAL PRESENTATION: 65 F remote former smoker with multiple medical problems including DM 2, paroxysmal atrial fibrillation, dilated cardiomyopathy (LVEF 20-25% by echocardiogram 10/19) presented to [facility] ED with nonproductive cough and shortness of breath.  Noted to be in respiratory distress with hypoxemia and intubated due to impending respiratory failure.  MAJOR EVENTS/TEST RESULTS: 4/12 admission as above.  Admission diagnoses of acute hypoxemic respiratory failure, pulmonary edema, possible pneumonia, severe sepsis, paroxysmal atrial fibrillation, hypertension, type 2 diabetes.   INDWELLING DEVICES:: ETT 4/12 >>    MICRO DATA: MRSA PCR 4/12>> negative Resp 4/12 >>  RVP 4/12 >>  Blood 4/12 >>  COVID-19 04/12 >>  Strep U Ag 04/12 >>  Legionella Ag 04/12 >>   ANTIMICROBIALS:  Vanc 04/12 x 1 Metronidazole 04/12 X 1  Cefepime 04/12 >>  Azithro 04/12 >>   HPI: No family available to provide history.  History obtained from chart documentation.  Level 5 caveat  Past Medical History:  Diagnosis Date  . Anxiety   . Cardiomyopathy ([place])    a. 10/2017 Echo: EF 20-25%, diff HK, mild MR. Nl RV size.  . DDD (degenerative disc disease), lumbar   . Diverticulosis   . Elevated troponin   . Essential hypertension   . GERD (gastroesophageal reflux disease)   . History of kidney stones   . Hyperlipidemia   . LBBB (left bundle branch block)   . PAF (paroxysmal atrial fibrillation) ([place])    a.  Diagnosed 10/2017; b.  On Eliquis and amiodarone; c. CHADS2VASc => 5 (CHF, HTN, age x 1, DM, female)  . Pneumonia   . Sciatica   . Type II diabetes mellitus ([place])    Social History   Socioeconomic History  . Marital status: Widowed    Spouse name: Not on file  . Number of children: Not on file  . Years of education: Not on file  . Highest education level: Not on file  Occupational History  . Not on file  Social Needs  . Financial resource strain: Patient refused   . Food insecurity:    Worry: Patient refused    Inability: Patient refused  . Transportation needs:    Medical: Patient refused    Non-medical: Patient refused  Tobacco Use  . Smoking status: Former Smoker    Packs/day: 1.50    Years: 15.00    Pack years: 22.50    Types: Cigarettes    Last attempt to quit: 05/12/1997    Years since quitting: 20.9  . Smokeless tobacco: Never Used  Substance and Sexual Activity  . Alcohol use: Not Currently  . Drug use: Never  . Sexual activity: Not Currently  Lifestyle  . Physical activity:    Days per week: Patient refused    Minutes per session: Patient refused  . Stress: Patient refused  Relationships  . Social connections:    Talks on phone: Patient refused    Gets together: Patient refused    Attends religious service: Patient refused    Active member of club or organization: Patient refused    Attends meetings of clubs or organizations: Patient refused    Relationship status: Patient refused  . Intimate partner violence:    Fear of current or ex partner: Patient refused    Emotionally abused: Patient refused    Physically abused: Patient refused    Forced sexual activity: Patient refused  Other Topics Concern  . Not on file  Social History Narrative  . Not on file  Family History  Problem Relation Age of Onset  . Heart attack Father 20  . Bladder Cancer Neg Hx   . Kidney cancer Neg Hx     SUBJ:    OBJ:  Vitals:   04/22/18 1200 04/22/18 1300 04/22/18 1400 04/22/18 1441  BP: (!) 147/66 128/60 (!) 124/54   Pulse: (!) 56 (!) 55 (!) 53   Resp: '18 13 18   ' Temp: 98.2 F (36.8 C)     TempSrc: Oral     SpO2: 98% 98% 99% 100%  Weight:      Height:        Vent Mode: AC FiO2 (%):  [40 %-100 %] 40 % Set Rate:  [15 bmp-20 bmp] 15 bmp Vt Set:  [450 mL] 450 mL PEEP:  [5 cmH20] 5 cmH20   Intake/Output Summary (Last 24 hours) at 04/22/2018 1637 Last data filed at 04/22/2018 1400 Gross per 24 hour  Intake 699.34 ml   Output 2975 ml  Net -2275.66 ml     Gen: Somewhat frail-appearing, intubated, sedated, unresponsive, synchronous with ventilator HEENT: NCAT, sclerae white Neck: No JVD noted Lungs: No wheezes or other adventitious sounds noted anteriorly Cardiovascular: Bradycardia, regular, no M Abdomen: Soft, nontender, normal BS Ext: without clubbing, cyanosis, edema Neuro: grossly intact Skin: Limited exam, no lesions noted   BMP Latest Ref Rng & Units 04/22/2018 03/22/2018 03/07/2018  Glucose 70 - 99 mg/dL 219(H) 211(H) 150(H)  BUN 8 - 23 mg/dL '13 14 17  ' Creatinine 0.44 - 1.00 mg/dL 1.05(H) 1.15(H) 1.60(H)  Sodium 135 - 145 mmol/L 137 137 133(L)  Potassium 3.5 - 5.1 mmol/L 3.5 3.7 3.4(L)  Chloride 98 - 111 mmol/L 104 103 103  CO2 22 - 32 mmol/L '24 26 24  ' Calcium 8.9 - 10.3 mg/dL 10.0 10.2 9.3    CBC Latest Ref Rng & Units 04/22/2018 04/22/2018 03/22/2018  WBC 4.0 - 10.5 K/uL 11.0(H) 15.7(H) 5.1  Hemoglobin 12.0 - 15.0 g/dL 11.7(L) 12.3 11.1(L)  Hematocrit 36.0 - 46.0 % 35.9(L) 38.3 34.7(L)  Platelets 150 - 400 K/uL 120(L) 181 137(L)    Hepatic Function Latest Ref Rng & Units 04/22/2018 02/01/2018 12/19/2017  Total Protein 6.5 - 8.1 g/dL 7.5 7.0 5.6(L)  Albumin 3.5 - 5.0 g/dL 3.6 3.3(L) 2.4(L)  AST 15 - 41 U/L 25 14(L) 21  ALT 0 - 44 U/L '18 11 13  ' Alk Phosphatase 38 - 126 U/L 87 62 60  Total Bilirubin 0.3 - 1.2 mg/dL 1.0 1.3(H) 0.8  Bilirubin, Direct 0.0 - 0.2 mg/dL 0.2 - -    Cardiac Panel (last 3 results) Recent Labs    04/22/18 0615  TROPONINI 0.06*    CXR: Asymmetric R >L opacities consistent with asymmetric edema versus pneumonia EKG: LBBB pattern (chronic)   IMPRESSION/PLAN:  PULMONARY: Acute hypoxemic respiratory failure Pulmonary edema +/- pneumonia  Vent settings established Vent bundle implemented Daily SBT as indicated Possible extubation 4/13  CARDIOVASCULAR: Dilated cardiomyopathy (last LVEF 20-25%) History of mitral regurgitation Chronic  LBBB Paroxysmal atrial fibrillation, sinus bradycardia presently   ICU hemodynamic monitoring Continue home cardiac medications  RENAL: No acute issues  Monitor BMET intermittently Monitor I/Os Correct electrolytes as indicated   GI/NUTRITION: No acute issues  SUP: IV famotidine Consider TF protocol 4/13 if not extubated  INFECTIOUS DISEASE: Possible pneumonia Rule out COVID-19 (low likelihood)  Monitor temp, WBC count Micro and abx as above   ENDOCRINE: DM 2 with hyperglycemia  Moderate scale SSI  HEME: Mild thrombocytopenia  DVT px: Chronic  apixaban (for PAF) Monitor CBC intermittently Transfuse per usual guidelines   NEURO: ICU/ventilator associated discomfort  PAD protocol - propofol, PRN fentanyl Daily wake up assessment  CCM time:40 mins The above time includes time spent in consultation with patient and/or family members and reviewing care plan on multidisciplinary rounds  Merton Border, MD PCCM service Mobile [phone] Pager [phone] 04/22/2018 4:37 PM

## 2018-04-22 NOTE — ED Notes (Signed)
Report given to Kim RN.

## 2018-04-22 NOTE — ED Triage Notes (Signed)
Pt arrives from by herself via EMS c/o shortness of breath; pt has a dry cough for 2-3 days.

## 2018-04-22 NOTE — H&P (Addendum)
[facility] - [place] [facility]   PATIENT NAME: Kristina Archer    MR#:  [id number]  DATE OF BIRTH:  1946/01/07  DATE OF ADMISSION:  04/22/2018  PRIMARY CARE PHYSICIAN: Corky Downs, MD   REQUESTING/REFERRING PHYSICIAN: Minna Antis, MD  CHIEF COMPLAINT:   Chief Complaint  Patient presents with  . Shortness of Breath    HISTORY OF PRESENT ILLNESS:  Kristina Archer  is a 73 y.o. female with a known history of paroxysmal atrial fibrillation, type 2 diabetes, hypertension, hyperlipidemia, chronic systolic congestive heart failure, anxiety who presented to the ED with shortness of breath and dry cough for the last day.  On my exam, patient intubated so history obtained from ED physician.  On arrival to the ED, O2 sats were in the mid 80s on 6 L O2.  Patient was transitioned to nonrebreather, but continued having worsening respiratory distress, so she was intubated.  In the ED, patient was meeting sepsis criteria with leukocytosis and tachypnea.  Chest x-ray with diffuse patchy bilateral lung opacities, asymmetric to the right, pulmonary edema versus atypical pneumonia.  She was started on broad-spectrum antibiotics.  Hospitalists were called for admission.  PAST MEDICAL HISTORY:   Past Medical History:  Diagnosis Date  . Anxiety   . Cardiomyopathy (HCC)    a. 10/2017 Echo: EF 20-25%, diff HK, mild MR. Nl RV size.  . DDD (degenerative disc disease), lumbar   . Diverticulosis   . Elevated troponin   . Essential hypertension   . GERD (gastroesophageal reflux disease)   . History of kidney stones   . Hyperlipidemia   . LBBB (left bundle branch block)   . PAF (paroxysmal atrial fibrillation) (HCC)    a.  Diagnosed 10/2017; b.  On Eliquis and amiodarone; c. CHADS2VASc => 5 (CHF, HTN, age x 1, DM, female)  . Pneumonia   . Sciatica   . Type II diabetes mellitus (HCC)     PAST SURGICAL HISTORY:   Past Surgical History:  Procedure Laterality Date  .  ABDOMINAL HYSTERECTOMY    . CHOLECYSTECTOMY    . CYSTOSCOPY W/ RETROGRADES Bilateral 05/24/2017   Procedure: CYSTOSCOPY WITH RETROGRADE PYELOGRAM;  Surgeon: Vanna Scotland, MD;  Location: [facility];  Service: Urology;  Laterality: Bilateral;  . CYSTOSCOPY W/ RETROGRADES Right 01/29/2018   Procedure: CYSTOSCOPY WITH RETROGRADE PYELOGRAM;  Surgeon: Vanna Scotland, MD;  Location: [facility];  Service: Urology;  Laterality: Right;  . CYSTOSCOPY W/ RETROGRADES Right 03/02/2018   Procedure: CYSTOSCOPY WITH RETROGRADE PYELOGRAM;  Surgeon: Sondra Come, MD;  Location: [facility];  Service: Urology;  Laterality: Right;  . CYSTOSCOPY W/ RETROGRADES Right 03/30/2018   Procedure: CYSTOSCOPY WITH RETROGRADE PYELOGRAM;  Surgeon: Sondra Come, MD;  Location: [facility];  Service: Urology;  Laterality: Right;  . CYSTOSCOPY W/ URETERAL STENT PLACEMENT Right 11/02/2017   Procedure: CYSTOSCOPY WITH RETROGRADE PYELOGRAM/URETERAL STENT PLACEMENT;  Surgeon: Crista Elliot, MD;  Location: [facility];  Service: Urology;  Laterality: Right;  . CYSTOSCOPY WITH STENT PLACEMENT Right 03/02/2018   Procedure: CYSTOSCOPY WITH STENT PLACEMENT-RIGHT;  Surgeon: Sondra Come, MD;  Location: [facility];  Service: Urology;  Laterality: Right;  . CYSTOSCOPY/URETEROSCOPY/HOLMIUM LASER/STENT PLACEMENT Left 05/24/2017   Procedure: CYSTOSCOPY/URETEROSCOPY/HOLMIUM LASER/STENT PLACEMENT;  Surgeon: Vanna Scotland, MD;  Location: [facility];  Service: Urology;  Laterality: Left;  . CYSTOSCOPY/URETEROSCOPY/HOLMIUM LASER/STENT PLACEMENT Right 01/29/2018   Procedure: CYSTOSCOPY/URETEROSCOPY/HOLMIUM LASER/STENT Exchange;  Surgeon: Vanna Scotland, MD;  Location: [facility];  Service: Urology;  Laterality: Right;  .  CYSTOSCOPY/URETEROSCOPY/HOLMIUM LASER/STENT PLACEMENT Right 03/30/2018   Procedure: CYSTOSCOPY/URETEROSCOPY/HOLMIUM LASER/STENT EXCHANGE;  Surgeon: Sondra Come, MD;  Location: [facility];  Service: Urology;  Laterality: Right;     SOCIAL HISTORY:   Social History   Tobacco Use  . Smoking status: Former Smoker    Packs/day: 1.50    Years: 15.00    Pack years: 22.50    Types: Cigarettes    Last attempt to quit: 05/12/1997    Years since quitting: 20.9  . Smokeless tobacco: Never Used  Substance Use Topics  . Alcohol use: Not Currently    FAMILY HISTORY:   Family History  Problem Relation Age of Onset  . Heart attack Father 52  . Bladder Cancer Neg Hx   . Kidney cancer Neg Hx     DRUG ALLERGIES:   Allergies  Allergen Reactions  . Codeine Nausea Only    REVIEW OF SYSTEMS:   ROS- unable to obtain, as patient is sedated and intubated  MEDICATIONS AT HOME:   Prior to Admission medications   Medication Sig Start Date End Date Taking? Authorizing Provider  amiodarone (PACERONE) 200 MG tablet Take 1 tablet (200 mg total) by mouth daily. 01/09/18   Sondra Barges, PA-C  apixaban (ELIQUIS) 5 MG TABS tablet Take 1 tablet (5 mg total) by mouth 2 (two) times daily. Patient taking differently: Take 5 mg by mouth daily.  11/13/17   Katha Hamming, MD  carvedilol (COREG) 3.125 MG tablet Take 1 tablet (3.125 mg total) by mouth 2 (two) times daily with a meal. 11/13/17   Katha Hamming, MD  docusate sodium (COLACE) 100 MG capsule Take 1 capsule (100 mg total) by mouth 2 (two) times daily. Patient not taking: Reported on 03/19/2018 01/29/18   Vanna Scotland, MD  feeding supplement, GLUCERNA SHAKE, (GLUCERNA SHAKE) LIQD Take 237 mLs by mouth 2 (two) times daily between meals. Patient not taking: Reported on 03/19/2018 12/22/17   Ramonita Lab, MD  furosemide (LASIX) 20 MG tablet Take 20 mg by mouth daily.    [provider]  HYDROcodone-acetaminophen (NORCO/VICODIN) 5-325 MG tablet Take 1-2 tablets by mouth every 6 (six) hours as needed for moderate pain. 01/29/18   Vanna Scotland, MD  lisinopril (PRINIVIL,ZESTRIL) 5 MG tablet TAKE ONE TABLET BY MOUTH EVERY DAY 04/02/18   Sondra Barges, PA-C   metFORMIN (GLUCOPHAGE) 500 MG tablet Take 500 mg by mouth 2 (two) times daily with a meal.    [provider]  pantoprazole (PROTONIX) 40 MG tablet Take 1 tablet (40 mg total) by mouth daily before breakfast. 02/03/18   Alford Highland, MD  PARoxetine (PAXIL) 20 MG tablet Take 20 mg by mouth at bedtime.    [provider]  potassium chloride SA (K-DUR,KLOR-CON) 20 MEQ tablet Take 2 tablets (40 mEq total) by mouth at bedtime. 02/03/18   Alford Highland, MD  rosuvastatin (CRESTOR) 40 MG tablet Take 1 tablet (40 mg total) by mouth daily. 11/13/17   Katha Hamming, MD  sitaGLIPtin (JANUVIA) 100 MG tablet Take 100 mg by mouth daily.    [provider]  sitaGLIPtin (JANUVIA) 50 MG tablet Take 1 tablet (50 mg total) by mouth daily. Patient not taking: Reported on 03/19/2018 03/07/18   Adrian Saran, MD  tamsulosin (FLOMAX) 0.4 MG CAPS capsule Take 1 capsule (0.4 mg total) by mouth daily. 03/07/18   Adrian Saran, MD  traZODone (DESYREL) 50 MG tablet Take 50 mg by mouth at bedtime.  11/30/17   [provider]  VITAL SIGNS:  Blood pressure (!) 152/71, pulse 62, temperature 98 F (36.7 C), temperature source Oral, resp. rate 20, weight 84 kg, SpO2 100 %.  PHYSICAL EXAMINATION:  Physical Exam  GENERAL:  73 y.o.-year-old patient lying in the bed, intubated and sedated. EYES: Pupils equal, round, reactive to light and accommodation. No scleral icterus. Extraocular muscles intact.  HEENT: Head atraumatic, normocephalic. Oropharynx and nasopharynx clear. ETT in place. NECK:  Supple, no jugular venous distention. No thyroid enlargement, no tenderness.  LUNGS: + Scattered rhonchi.  No wheezing.  No use of accessory muscles of respiration.  CARDIOVASCULAR: S1, S2 normal. No murmurs, rubs, or gallops.  ABDOMEN: Soft, nontender, nondistended. Bowel sounds present. No organomegaly or mass.  EXTREMITIES: No pedal edema, cyanosis, or clubbing.  NEUROLOGIC: Unable to assess  due to sedation PSYCHIATRIC: Unable to assess SKIN: No obvious rash, lesion, or ulcer.   LABORATORY PANEL:   CBC Recent Labs  Lab 04/22/18 0615  WBC 15.7*  HGB 12.3  HCT 38.3  PLT 181   ------------------------------------------------------------------------------------------------------------------  Chemistries  Recent Labs  Lab 04/22/18 0615  NA 137  K 3.5  CL 104  CO2 24  GLUCOSE 219*  BUN 13  CREATININE 1.05*  CALCIUM 10.0   ------------------------------------------------------------------------------------------------------------------  Cardiac Enzymes Recent Labs  Lab 04/22/18 0615  TROPONINI 0.06*   ------------------------------------------------------------------------------------------------------------------  RADIOLOGY:  Dg Abdomen 1 View  Result Date: 04/22/2018 CLINICAL DATA:  Enteric tube placement EXAM: ABDOMEN - 1 VIEW COMPARISON:  03/01/2018 CT abdomen/pelvis FINDINGS: Enteric tube terminates in proximal stomach. Cholecystectomy clips are seen in the right upper quadrant of the abdomen. No dilated small bowel loops. Mild colonic stool. No evidence of pneumatosis or pneumoperitoneum. No radiopaque nephrolithiasis. IMPRESSION: Enteric tube terminates in proximal stomach. Nonobstructive bowel gas pattern. Electronically Signed   By: Delbert Phenix M.D.   On: 04/22/2018 07:27   Dg Chest Portable 1 View  Result Date: 04/22/2018 CLINICAL DATA:  Intubated, respiratory failure EXAM: PORTABLE CHEST 1 VIEW COMPARISON:  Chest radiograph from earlier today. FINDINGS: Endotracheal tube tip is 2.6 cm above the carina. Enteric tube enters stomach with the tip not seen on this image. Stable cardiomediastinal silhouette with mild cardiomegaly. No pneumothorax. Possible trace right pleural effusion. No left pleural effusion. Diffuse patchy opacities in both lungs, predominantly perihilar, asymmetric to the right, not appreciably changed. IMPRESSION: 1. Well-positioned  endotracheal and enteric tubes. 2. Stable mild cardiomegaly. Stable diffuse patchy bilateral lung opacities, predominantly parahilar, asymmetric to the right, favor pulmonary edema, cannot exclude atypical pneumonia. 3. Possible trace right pleural effusion. Electronically Signed   By: Delbert Phenix M.D.   On: 04/22/2018 07:26   Dg Chest Portable 1 View  Result Date: 04/22/2018 CLINICAL DATA:  Cough EXAM: PORTABLE CHEST 1 VIEW COMPARISON:  03/04/2018 chest radiograph. FINDINGS: Stable cardiomediastinal silhouette with borderline mild cardiomegaly. No pneumothorax. No pleural effusion. Diffuse patchy opacities in both lungs. IMPRESSION: Diffuse patchy opacities in both lungs with borderline mild cardiomegaly. Differential includes pulmonary edema or atypical pneumonia including viral pneumonia. Electronically Signed   By: Delbert Phenix M.D.   On: 04/22/2018 07:22      IMPRESSION AND PLAN:   Acute hypoxic respiratory failure- secondary to pulmonary edema vs CAP vs COVID- no known exposures or fevers. -Vent management per CCM -COVID testing pending -Continue broad-spectrum antibiotics -Will give a dose of Lasix -Check CT chest -RVP -Add on differential and LFTs -Sputum culture  Sepsis- meeting sepsis criteria on admission with tachypnea and leukocytosis.  Due to above. -Continue  broad-spectrum antibiotics -Blood cultures pending  Acute exacerbation of chronic combined systolic and diastolic CHF- most recent echo 12/19 with EF 20 to 25% and grade 1 diastolic dysfunction. -Check BNP -Will give a dose of Lasix  Paroxysmal atrial fibrillation-in normal sinus rhythm in the ED. -Continue amiodarone, Coreg, Eliquis  Hypertension-BPs elevated -Continue Coreg and lisinopril  Type 2 diabetes- blood sugars elevated -SSI  Hyperlipidemia-stable -Continue home Crestor  All the records are reviewed and case discussed with ED provider. Management plans discussed with the patient, family and they  are in agreement.  CODE STATUS: Full  TOTAL TIME TAKING CARE OF THIS PATIENT: 45 minutes.    Jinny BlossomKaty D Mayo M.D on 04/22/2018 at 7:58 AM  Between 7am to 6pm - Pager - [phone]  After 6pm go to [email or web address] - Social research officer, [email or web address] [email or web address]  [facility] [place] [facility]  Office  [phone]  CC: Primary care physician; Corky DownsMasoud, [name], MD   Note: This dictation was prepared with Dragon dictation along with smaller phrase technology. Any transcriptional errors that result from this process are unintentional.

## 2018-04-22 NOTE — ED Notes (Signed)
ED TO INPATIENT HANDOFF REPORT  ED Nurse Name and Phone #:  Selena Batten [phone]  S Name/Age/Gender Kristina Archer 73 y.o. female Room/Bed: ED11A/ED11A  Code Status   Code Status: Prior  Home/SNF/Other Home Patient oriented to: self, place, time and situation Is this baseline? Yes   Triage Complete: Triage complete  Chief Complaint SOB  Triage Note Pt arrives from by herself via EMS c/o shortness of breath; pt has a dry cough for 2-3 days.    Allergies Allergies  Allergen Reactions  . Codeine Nausea Only    Level of Care/Admitting Diagnosis ED Disposition    ED Disposition Condition Comment   Admit  Hospital Area: [facility] [[id number]]  Level of Care: ICU [6]  Diagnosis: Acute respiratory failure with hypoxia [facility]) [[id number]]  Admitting Physician: Willadean Carol DODD [[id number]]  Attending Physician: Willadean Carol DODD [[id number]]  Estimated length of stay: past midnight tomorrow  Certification:: I certify this patient will need inpatient services for at least 2 midnights  PT Class (Do Not Modify): Inpatient [101]  PT Acc Code (Do Not Modify): Private [1]       B Medical/Surgery History Past Medical History:  Diagnosis Date  . Anxiety   . Cardiomyopathy (HCC)    a. 10/2017 Echo: EF 20-25%, diff HK, mild MR. Nl RV size.  . DDD (degenerative disc disease), lumbar   . Diverticulosis   . Elevated troponin   . Essential hypertension   . GERD (gastroesophageal reflux disease)   . History of kidney stones   . Hyperlipidemia   . LBBB (left bundle branch block)   . PAF (paroxysmal atrial fibrillation) (HCC)    a.  Diagnosed 10/2017; b.  On Eliquis and amiodarone; c. CHADS2VASc => 5 (CHF, HTN, age x 1, DM, female)  . Pneumonia   . Sciatica   . Type II diabetes mellitus (HCC)    Past Surgical History:  Procedure Laterality Date  . ABDOMINAL HYSTERECTOMY    . CHOLECYSTECTOMY    . CYSTOSCOPY W/ RETROGRADES Bilateral 05/24/2017   Procedure: CYSTOSCOPY WITH  RETROGRADE PYELOGRAM;  Surgeon: Vanna Scotland, MD;  Location: [facility];  Service: Urology;  Laterality: Bilateral;  . CYSTOSCOPY W/ RETROGRADES Right 01/29/2018   Procedure: CYSTOSCOPY WITH RETROGRADE PYELOGRAM;  Surgeon: Vanna Scotland, MD;  Location: [facility];  Service: Urology;  Laterality: Right;  . CYSTOSCOPY W/ RETROGRADES Right 03/02/2018   Procedure: CYSTOSCOPY WITH RETROGRADE PYELOGRAM;  Surgeon: Sondra Come, MD;  Location: [facility];  Service: Urology;  Laterality: Right;  . CYSTOSCOPY W/ RETROGRADES Right 03/30/2018   Procedure: CYSTOSCOPY WITH RETROGRADE PYELOGRAM;  Surgeon: Sondra Come, MD;  Location: [facility];  Service: Urology;  Laterality: Right;  . CYSTOSCOPY W/ URETERAL STENT PLACEMENT Right 11/02/2017   Procedure: CYSTOSCOPY WITH RETROGRADE PYELOGRAM/URETERAL STENT PLACEMENT;  Surgeon: Crista Elliot, MD;  Location: [facility];  Service: Urology;  Laterality: Right;  . CYSTOSCOPY WITH STENT PLACEMENT Right 03/02/2018   Procedure: CYSTOSCOPY WITH STENT PLACEMENT-RIGHT;  Surgeon: Sondra Come, MD;  Location: [facility];  Service: Urology;  Laterality: Right;  . CYSTOSCOPY/URETEROSCOPY/HOLMIUM LASER/STENT PLACEMENT Left 05/24/2017   Procedure: CYSTOSCOPY/URETEROSCOPY/HOLMIUM LASER/STENT PLACEMENT;  Surgeon: Vanna Scotland, MD;  Location: [facility];  Service: Urology;  Laterality: Left;  . CYSTOSCOPY/URETEROSCOPY/HOLMIUM LASER/STENT PLACEMENT Right 01/29/2018   Procedure: CYSTOSCOPY/URETEROSCOPY/HOLMIUM LASER/STENT Exchange;  Surgeon: Vanna Scotland, MD;  Location: [facility];  Service: Urology;  Laterality: Right;  . CYSTOSCOPY/URETEROSCOPY/HOLMIUM LASER/STENT PLACEMENT Right 03/30/2018   Procedure: CYSTOSCOPY/URETEROSCOPY/HOLMIUM LASER/STENT EXCHANGE;  Surgeon: Richardo Hanks,  Laurette SchimkeBrian C, MD;  Location: [facility];  Service: Urology;  Laterality: Right;     A IV Location/Drains/Wounds Patient Lines/Drains/Airways Status   Active Line/Drains/Airways    Name:   Placement date:    Placement time:   Site:   Days:   Peripheral IV 04/22/18 Right Hand   04/22/18    0622    Hand   less than 1   Peripheral IV 04/22/18 Left Antecubital   04/22/18    0624    Antecubital   less than 1   Peripheral IV 04/22/18 Right Antecubital   04/22/18    0817    Antecubital   less than 1   Peripheral IV 04/22/18 Left Wrist   04/22/18    0839    Wrist   less than 1   NG/OG Tube Orogastric Center mouth Xray Documented cm marking at nare/ corner of mouth 55 cm   04/22/18    0647    Center mouth   less than 1   Urethral Catheter ED RN   04/22/18    0650    -   less than 1   Ureteral Drain/Stent Right ureter 6 Fr.   03/30/18    0937    Right ureter   23   Airway 7 mm   04/22/18    0632     less than 1   Incision (Closed) 03/30/18 Vagina Other (Comment)   03/30/18    0932     23          Intake/Output Last 24 hours No intake or output data in the 24 hours ending 04/22/18 0855  Labs/Imaging Results for orders placed or performed during the hospital encounter of 04/22/18 (from the past 48 hour(s))  Basic metabolic panel     Status: Abnormal   Collection Time: 04/22/18  6:15 AM  Result Value Ref Range   Sodium 137 135 - 145 mmol/L   Potassium 3.5 3.5 - 5.1 mmol/L   Chloride 104 98 - 111 mmol/L   CO2 24 22 - 32 mmol/L   Glucose, Bld 219 (H) 70 - 99 mg/dL   BUN 13 8 - 23 mg/dL   Creatinine, Ser 1.611.05 (H) 0.44 - 1.00 mg/dL   Calcium 09.610.0 8.9 - 04.510.3 mg/dL   GFR calc non Af Amer 54 (L) >60 mL/min   GFR calc Af Amer >60 >60 mL/min   Anion gap 9 5 - 15    Comment: Performed at [facility], [address] [place], [place] [address]  CBC     Status: Abnormal   Collection Time: 04/22/18  6:15 AM  Result Value Ref Range   WBC 15.7 (H) 4.0 - 10.5 K/uL   RBC 4.63 3.87 - 5.11 MIL/uL   Hemoglobin 12.3 12.0 - 15.0 g/dL   HCT 19.138.3 47.836.0 - 29.546.0 %   MCV 82.7 80.0 - 100.0 fL   MCH 26.6 26.0 - 34.0 pg   MCHC 32.1 30.0 - 36.0 g/dL   RDW 62.116.3 (H) 30.811.5 - 65.715.5 %   Platelets 181 150 - 400 K/uL    nRBC 0.0 0.0 - 0.2 %    Comment: Performed at [facility], [address] [place], [place] [address]  Troponin I - ONCE - STAT     Status: Abnormal   Collection Time: 04/22/18  6:15 AM  Result Value Ref Range   Troponin I 0.06 (HH) <0.03 ng/mL    Comment: CRITICAL RESULT CALLED TO, READ BACK  BY AND VERIFIED WITH KATY FOUST AT 0709 04/22/2018.PMF Performed at [facility] [place], [address] [place], [place] [address]   Lactic acid, plasma     Status: None   Collection Time: 04/22/18  6:15 AM  Result Value Ref Range   Lactic Acid, Venous 1.6 0.5 - 1.9 mmol/L    Comment: Performed at [facility], [address] [place], [place] [address]  Hepatic function panel     Status: None   Collection Time: 04/22/18  6:15 AM  Result Value Ref Range   Total Protein 7.5 6.5 - 8.1 g/dL   Albumin 3.6 3.5 - 5.0 g/dL   AST 25 15 - 41 U/L   ALT 18 0 - 44 U/L   Alkaline Phosphatase 87 38 - 126 U/L   Total Bilirubin 1.0 0.3 - 1.2 mg/dL   Bilirubin, Direct 0.2 0.0 - 0.2 mg/dL   Indirect Bilirubin 0.8 0.3 - 0.9 mg/dL    Comment: Performed at [facility], [address] [place], [place] [address]  Brain natriuretic peptide     Status: Abnormal   Collection Time: 04/22/18  6:15 AM  Result Value Ref Range   B Natriuretic Peptide 1,504.0 (H) 0.0 - 100.0 pg/mL    Comment: Performed at [facility], [address] [place], [place] [address]  Blood gas, arterial ([facility], [facility], [facility])     Status: Abnormal   Collection Time: 04/22/18  6:45 AM  Result Value Ref Range   FIO2 1.00    Mode ASSIST CONTROL    VT 450 mL   LHR 20 resp/min   Peep/cpap 5.0 cm H20   pH, Arterial 7.40 7.350 - 7.450   pCO2 arterial 41 32.0 - 48.0 mmHg   pO2, Arterial 204 (H) 83.0 - 108.0 mmHg   Bicarbonate 25.4 20.0 - 28.0 mmol/L   Acid-Base Excess 0.5 0.0 - 2.0 mmol/L   O2 Saturation 99.7 %   Patient temperature 37.0    Collection site RIGHT RADIAL    Sample type ARTERIAL DRAW     Allens test (pass/fail) PASS PASS    Comment: Performed at [facility], [address] [place], [place] [address]  Urinalysis, Routine w reflex microscopic     Status: Abnormal   Collection Time: 04/22/18  7:23 AM  Result Value Ref Range   Color, Urine YELLOW (A) YELLOW   APPearance CLOUDY (A) CLEAR   Specific Gravity, Urine 1.014 1.005 - 1.030   pH 5.0 5.0 - 8.0   Glucose, UA 50 (A) NEGATIVE mg/dL   Hgb urine dipstick SMALL (A) NEGATIVE   Bilirubin Urine NEGATIVE NEGATIVE   Ketones, ur NEGATIVE NEGATIVE mg/dL   Protein, ur 130 (A) NEGATIVE mg/dL   Nitrite NEGATIVE NEGATIVE   Leukocytes,Ua MODERATE (A) NEGATIVE   RBC / HPF 0-5 0 - 5 RBC/hpf   WBC, UA >50 (H) 0 - 5 WBC/hpf   Bacteria, UA RARE (A) NONE SEEN   Squamous Epithelial / LPF NONE SEEN 0 - 5   WBC Clumps PRESENT    Mucus PRESENT    Hyaline Casts, UA PRESENT     Comment: Performed at [facility], [address] [place], [place] [address]   Dg Abdomen 1 View  Result Date: 04/22/2018 CLINICAL DATA:  Enteric tube placement EXAM: ABDOMEN - 1 VIEW COMPARISON:  03/01/2018 CT abdomen/pelvis FINDINGS: Enteric tube terminates in proximal stomach. Cholecystectomy clips are seen in the right upper quadrant of the abdomen. No dilated small bowel  loops. Mild colonic stool. No evidence of pneumatosis or pneumoperitoneum. No radiopaque nephrolithiasis. IMPRESSION: Enteric tube terminates in proximal stomach. Nonobstructive bowel gas pattern. Electronically Signed   By: Delbert Phenix M.D.   On: 04/22/2018 07:27   Dg Chest Portable 1 View  Result Date: 04/22/2018 CLINICAL DATA:  Intubated, respiratory failure EXAM: PORTABLE CHEST 1 VIEW COMPARISON:  Chest radiograph from earlier today. FINDINGS: Endotracheal tube tip is 2.6 cm above the carina. Enteric tube enters stomach with the tip not seen on this image. Stable cardiomediastinal silhouette with mild cardiomegaly. No pneumothorax. Possible trace right pleural effusion.  No left pleural effusion. Diffuse patchy opacities in both lungs, predominantly perihilar, asymmetric to the right, not appreciably changed. IMPRESSION: 1. Well-positioned endotracheal and enteric tubes. 2. Stable mild cardiomegaly. Stable diffuse patchy bilateral lung opacities, predominantly parahilar, asymmetric to the right, favor pulmonary edema, cannot exclude atypical pneumonia. 3. Possible trace right pleural effusion. Electronically Signed   By: Delbert Phenix M.D.   On: 04/22/2018 07:26   Dg Chest Portable 1 View  Result Date: 04/22/2018 CLINICAL DATA:  Cough EXAM: PORTABLE CHEST 1 VIEW COMPARISON:  03/04/2018 chest radiograph. FINDINGS: Stable cardiomediastinal silhouette with borderline mild cardiomegaly. No pneumothorax. No pleural effusion. Diffuse patchy opacities in both lungs. IMPRESSION: Diffuse patchy opacities in both lungs with borderline mild cardiomegaly. Differential includes pulmonary edema or atypical pneumonia including viral pneumonia. Electronically Signed   By: Delbert Phenix M.D.   On: 04/22/2018 07:22    Pending Labs Unresulted Labs (From admission, onward)    Start     Ordered   04/22/18 0646  Novel Coronavirus, NAA (hospital order; send-out to ref lab)  (Novel Coronavirus, NAA [facility] Order; send-out to ref lab) with precautions panel)  Once,   STAT    Question Answer Comment  Current symptoms Other (testing not indicated)   Patient immune status Normal      04/22/18 0645   04/22/18 0645  Lactic acid, plasma  Now then every 2 hours,   STAT     04/22/18 0645   04/22/18 0645  Blood Culture (routine x 2)  BLOOD CULTURE X 2,   STAT     04/22/18 0645   Signed and Held  Basic metabolic panel  Tomorrow morning,   R     Signed and Held   Signed and Held  CBC  Tomorrow morning,   R     Signed and Held   Signed and Held  Procalcitonin - Baseline  ONCE - STAT,   STAT     Signed and Held   Signed and Held  Procalcitonin  Daily,   R     Signed and Held   Signed and Held   Differential  Add-on,   R     Signed and Held   Signed and Held  Hepatic function panel  Add-on,   R     Signed and Held   Signed and Held  Respiratory Panel by PCR  (Respiratory virus panel with precautions)  Once,   R     Signed and Held   Signed and Held  Brain natriuretic peptide  Add-on,   R     Signed and Held   Signed and Held  Culture, respiratory (non-expectorated)  Once,   R    Question:  Patient immune status  Answer:  Normal   Signed and Held          Vitals/Pain Today's Vitals   04/22/18 0715 04/22/18 0730 04/22/18 0745  04/22/18 0800  BP: (!) 140/94 (!) 152/71 (!) 150/66 (!) 155/72  Pulse: 64 62 (!) 59 60  Resp: Temp:      TempSrc:      SpO2: 100% 100% 100% 100%  Weight: 84 kg       Isolation Precautions Droplet and Contact precautions  Medications Medications  albuterol (PROVENTIL) (2.5 MG/3ML) 0.083% nebulizer solution 5 mg (has no administration in time range)  propofol (DIPRIVAN) 1000 MG/100ML infusion (30 mcg/kg/min Intravenous Rate/Dose Change 04/22/18 0658)  ceFEPIme (MAXIPIME) 2 g in sodium chloride 0.9 % 100 mL IVPB (2 g Intravenous New Bag/Given 04/22/18 0830)  metroNIDAZOLE (FLAGYL) IVPB 500 mg (500 mg Intravenous New Bag/Given 04/22/18 0842)  vancomycin (VANCOCIN) 2,000 mg in sodium chloride 0.9 % 500 mL IVPB (has no administration in time range)  etomidate (AMIDATE) injection 20 mg (20 mg Intravenous Given 04/22/18 0630)  succinylcholine (ANECTINE) injection 120 mg (120 mg Intravenous Given 04/22/18 0631)  furosemide (LASIX) injection 60 mg (60 mg Intravenous Given 04/22/18 0865)    Mobility  High fall risk   Focused Assessments Pulmonary Assessment Handoff:  Lung sounds: Bilateral Breath Sounds: Rhonchi O2 Device: NRB O2 Flow Rate (L/min): 10 L/min      R Recommendations: See Admitting Provider Note  Report given to:   Additional Notes:

## 2018-04-22 NOTE — Progress Notes (Signed)
RR on Trilogy Ventilator turned down to 15 from 20 per MD order

## 2018-04-22 NOTE — ED Provider Notes (Signed)
[facility] Emergency Department Provider Note  Time seen: 6:50 AM  I have reviewed the triage vital signs and the nursing notes.   HISTORY  Chief Complaint Shortness of Breath   HPI Kristina Archer is a 73 y.o. female with a past medical history of anxiety, cardiomyopathy, gastric reflux, hypertension, hyperlipidemia, presents to the emergency department with respiratory distress.  Per EMS patient arrives from home, no reported long history, no home O2 oxygen requirement.  Patient states dry cough all day today with significant shortness of breath for the past several hours.  Upon arrival patient is satting in the 80s on 6 L of oxygen via nasal cannula.  EMS reports 78% on room air upon their arrival.  No reported fever, patient denies any known sick contacts or travel.  Patient is in moderate respiratory distress sitting upright in bed quite diaphoretic keeps repeating that she cannot breathe.   Past Medical History:  Diagnosis Date  . Anxiety   . Cardiomyopathy (HCC)    a. 10/2017 Echo: EF 20-25%, diff HK, mild MR. Nl RV size.  . DDD (degenerative disc disease), lumbar   . Diverticulosis   . Elevated troponin   . Essential hypertension   . GERD (gastroesophageal reflux disease)   . History of kidney stones   . Hyperlipidemia   . LBBB (left bundle branch block)   . PAF (paroxysmal atrial fibrillation) (HCC)    a.  Diagnosed 10/2017; b.  On Eliquis and amiodarone; c. CHADS2VASc => 5 (CHF, HTN, age x 1, DM, female)  . Pneumonia   . Sciatica   . Type II diabetes mellitus [facility](HCC)     Patient Active Problem List   Diagnosis Date Noted  . Acute renal insufficiency   . Acute respiratory failure with hypoxia (HCC) 03/05/2018  . HLD (hyperlipidemia) 03/02/2018  . Paroxysmal atrial fibrillation (HCC) 03/02/2018  . GERD (gastroesophageal reflux disease) 03/02/2018  . Anxiety 03/02/2018  . Urinary tract infection with hematuria   . Pneumonia of both lower lobes  due to infectious organism (HCC)   . Sepsis (HCC) 12/18/2017  . Acute on chronic systolic heart failure (HCC)   . Ureterolithiasis 11/02/2017    Past Surgical History:  Procedure Laterality Date  . ABDOMINAL HYSTERECTOMY    . CHOLECYSTECTOMY    . CYSTOSCOPY W/ RETROGRADES Bilateral 05/24/2017   Procedure: CYSTOSCOPY WITH RETROGRADE PYELOGRAM;  Surgeon: [name], [name], MD;  Location: [facility] ORS;  Service: Urology;  Laterality: Bilateral;  . CYSTOSCOPY W/ RETROGRADES Right 01/29/2018   Procedure: CYSTOSCOPY WITH RETROGRADE PYELOGRAM;  Surgeon: [name], [name], MD;  Location: [facility] ORS;  Service: Urology;  Laterality: Right;  . CYSTOSCOPY W/ RETROGRADES Right 03/02/2018   Procedure: CYSTOSCOPY WITH RETROGRADE PYELOGRAM;  Surgeon: Sondra ComeSninsky, Brian C, MD;  Location: [facility] ORS;  Service: Urology;  Laterality: Right;  . CYSTOSCOPY W/ RETROGRADES Right 03/30/2018   Procedure: CYSTOSCOPY WITH RETROGRADE PYELOGRAM;  Surgeon: Sondra ComeSninsky, Brian C, MD;  Location: [facility] ORS;  Service: Urology;  Laterality: Right;  . CYSTOSCOPY W/ URETERAL STENT PLACEMENT Right 11/02/2017   Procedure: CYSTOSCOPY WITH RETROGRADE PYELOGRAM/URETERAL STENT PLACEMENT;  Surgeon: [name], [name], MD;  Location: [facility] ORS;  Service: Urology;  Laterality: Right;  . CYSTOSCOPY WITH STENT PLACEMENT Right 03/02/2018   Procedure: CYSTOSCOPY WITH STENT PLACEMENT-RIGHT;  Surgeon: Sondra ComeSninsky, Brian C, MD;  Location: [facility] ORS;  Service: Urology;  Laterality: Right;  . CYSTOSCOPY/URETEROSCOPY/HOLMIUM LASER/STENT PLACEMENT Left 05/24/2017   Procedure: CYSTOSCOPY/URETEROSCOPY/HOLMIUM LASER/STENT PLACEMENT;  Surgeon: [name], [name], MD;  [facility];  Service: Urology;  Laterality: Left;  . CYSTOSCOPY/URETEROSCOPY/HOLMIUM LASER/STENT PLACEMENT Right 01/29/2018   Procedure: CYSTOSCOPY/URETEROSCOPY/HOLMIUM LASER/STENT Exchange;  Surgeon: Vanna Scotland, MD;  Location: [facility] ORS;  Service: Urology;  Laterality: Right;  . CYSTOSCOPY/URETEROSCOPY/HOLMIUM  LASER/STENT PLACEMENT Right 03/30/2018   Procedure: CYSTOSCOPY/URETEROSCOPY/HOLMIUM LASER/STENT EXCHANGE;  Surgeon: Sondra Come, MD;  Location: [facility] ORS;  Service: Urology;  Laterality: Right;    Prior to Admission medications   Medication Sig Start Date End Date Taking? Authorizing Provider  amiodarone (PACERONE) 200 MG tablet Take 1 tablet (200 mg total) by mouth daily. 01/09/18   Sondra Barges, PA-C  apixaban (ELIQUIS) 5 MG TABS tablet Take 1 tablet (5 mg total) by mouth 2 (two) times daily. Patient taking differently: Take 5 mg by mouth daily.  11/13/17   Katha Hamming, MD  carvedilol (COREG) 3.125 MG tablet Take 1 tablet (3.125 mg total) by mouth 2 (two) times daily with a meal. 11/13/17   Katha Hamming, MD  docusate sodium (COLACE) 100 MG capsule Take 1 capsule (100 mg total) by mouth 2 (two) times daily. Patient not taking: Reported on 03/19/2018 01/29/18   Vanna Scotland, MD  feeding supplement, GLUCERNA SHAKE, (GLUCERNA SHAKE) LIQD Take 237 mLs by mouth 2 (two) times daily between meals. Patient not taking: Reported on 03/19/2018 12/22/17   Ramonita Lab, MD  furosemide (LASIX) 20 MG tablet Take 20 mg by mouth daily.    [provider]  HYDROcodone-acetaminophen (NORCO/VICODIN) 5-325 MG tablet Take 1-2 tablets by mouth every 6 (six) hours as needed for moderate pain. 01/29/18   Vanna Scotland, MD  lisinopril (PRINIVIL,ZESTRIL) 5 MG tablet TAKE ONE TABLET BY MOUTH EVERY DAY 04/02/18   Sondra Barges, PA-C  metFORMIN (GLUCOPHAGE) 500 MG tablet Take 500 mg by mouth 2 (two) times daily with a meal.    [provider]  pantoprazole (PROTONIX) 40 MG tablet Take 1 tablet (40 mg total) by mouth daily before breakfast. 02/03/18   Alford Highland, MD  PARoxetine (PAXIL) 20 MG tablet Take 20 mg by mouth at bedtime.    [provider]  potassium chloride SA (K-DUR,KLOR-CON) 20 MEQ tablet Take 2 tablets (40 mEq total) by mouth at bedtime. 02/03/18   Alford Highland,  MD  rosuvastatin (CRESTOR) 40 MG tablet Take 1 tablet (40 mg total) by mouth daily. 11/13/17   Katha Hamming, MD  sitaGLIPtin (JANUVIA) 100 MG tablet Take 100 mg by mouth daily.    [provider]  sitaGLIPtin (JANUVIA) 50 MG tablet Take 1 tablet (50 mg total) by mouth daily. Patient not taking: Reported on 03/19/2018 03/07/18   Adrian Saran, MD  tamsulosin (FLOMAX) 0.4 MG CAPS capsule Take 1 capsule (0.4 mg total) by mouth daily. 03/07/18   Adrian Saran, MD  traZODone (DESYREL) 50 MG tablet Take 50 mg by mouth at bedtime.  11/30/17   [provider]    Allergies  Allergen Reactions  . Codeine Nausea Only    Family History  Problem Relation Age of Onset  . Heart attack Father 50  . Bladder Cancer Neg Hx   . Kidney cancer Neg Hx     Social History Social History   Tobacco Use  . Smoking status: Former Smoker    Packs/day: 1.50    Years: 15.00    Pack years: 22.50    Types: Cigarettes    Last attempt to quit: 05/12/1997    Years since quitting: 20.9  . Smokeless tobacco: Never Used  Substance Use Topics  .  Alcohol use: Not Currently  . Drug use: Never    Review of Systems Constitutional: Negative for fever. Cardiovascular: Negative for chest pain. Respiratory: Positive for shortness of breath.  Positive for dry cough. Gastrointestinal: Negative for abdominal pain, vomiting  Musculoskeletal: Negative for musculoskeletal complaints Skin: Negative for skin complaints  Neurological: Negative for headache All other ROS negative  ____________________________________________   PHYSICAL EXAM:  VITAL SIGNS: ED Triage Vitals  Enc Vitals Group     BP 04/22/18 0606 (!) 178/117     Pulse Rate 04/22/18 0606 88     Resp 04/22/18 0606 18     Temp 04/22/18 0606 98 F (36.7 C)     Temp Source 04/22/18 0606 Oral     SpO2 04/22/18 0603 92 %     Weight --      Height --      Head Circumference --      Peak Flow --      Pain Score --      Pain Loc --       Pain Edu? --      Excl. in GC? --    Constitutional: Awake and alert but in moderate respiratory distress sitting upright in bed diaphoretic stating "I cannot breathe." Eyes: Normal exam ENT      Head: Normocephalic and atraumatic.      Mouth/Throat: Mucous membranes are moist. Cardiovascular: Regular rhythm rate around 110 bpm. Respiratory: Tachypnea with diminished breath sounds throughout rhonchi auscultated right greater than left Gastrointestinal: Soft and nontender. No distention.  Musculoskeletal: Nontender with normal range of motion in all extremities. No lower extremity tenderness or edema. Neurologic:  Normal speech and language. No gross focal neurologic deficits  Skin:  Skin is warm, diaphoretic. Psychiatric: Mood and affect are normal.   ____________________________________________    EKG  EKG viewed and interpreted by myself shows a sinus rhythm at 97 bpm with a widened QRS, normal axis, slight QTC prolongation, left bundle branch block.  Patient has a history of left bundle branch block previously.  ____________________________________________    RADIOLOGY  Appears to be consistent with pneumonia versus pulmonary edema, official read pending.  ____________________________________________   INITIAL IMPRESSION / ASSESSMENT AND PLAN / ED COURSE  Pertinent labs & imaging results that were available during my care of the patient were reviewed by me and considered in my medical decision making (see chart for details).   Patient presents to the emergency department for acute onset of shortness of breath, cough.  Patient is in moderate respiratory distress diaphoretic, sitting upright in bed saying she cannot breathe.  Patient is on a nonrebreather mask satting 88 to 90%.  Differential this time is quite broad but would include pneumonia, viral infection such as coronavirus, CHF/pulmonary edema, undiagnosed COPD or reactive airway disease, pneumothorax, ACS.  Given the  patient's significant difficulty breathing, she appeared to be tiring very quickly, I discussed with the patient intubation, patient is agreeable to intubation given her significant difficulty breathing and sensation of shortness of breath.  Decision was made to intubate the patient.  Precautions were made to limit people in the room, I wore a capr, and intubation Plexiglas box was used.  Patient tolerated intubation well.  Patient satting in the upper 90s to 100% status post intubation.  We will place the patient on propofol for sedation.  Chest x-ray has been performed, tube appears to be well placed on my examination of the film, patient appears to have bilateral infiltrates right greater  than left versus asymmetrical pulmonary edema, awaiting official radiology read.  Given the patient's apparent acute worsening over the day today as well as x-ray findings suspicion for coronavirus is moderate.  We will test.  Lab work including cardiac enzymes are pending.  Given her x-ray findings and hypoxia will start the patient on broad-spectrum antibiotics while awaiting results.  Labs are pending, however patient admitted, receiving antibiotics.  We will dose IV Lasix.  We will hold off on fluids at this time given the possibility of pulmonary edema.  Hospitalist is aware.  AMERIA SANJURJO was evaluated in Emergency Department on 04/22/2018 for the symptoms described in the history of present illness. She was evaluated in the context of the global COVID-19 pandemic, which necessitated consideration that the patient might be at risk for infection with the SARS-CoV-2 virus that causes COVID-19. Institutional protocols and algorithms that pertain to the evaluation of patients at risk for COVID-19 are in a state of rapid change based on information released by regulatory bodies including the CDC and federal and state organizations. These policies and algorithms were followed during the patient's care in the  ED.   INTUBATION Performed by: Minna Antis  Required items: required blood products, implants, devices, and special equipment available Patient identity confirmed: provided demographic data and hospital-assigned identification number Time out: Immediately prior to procedure a "time out" was called to verify the correct patient, procedure, equipment, support staff and site/side marked as required.  Indications: Respiratory failure  Intubation method: 4.0 Glidescope Laryngoscopy   Preoxygenation: 100% BVM  Sedatives: 20 mg etomidate Paralytic: 120 mg succinylcholine  Tube Size: 7.0 cuffed  Post-procedure assessment: chest rise and ETCO2 monitor Breath sounds: equal and absent over the epigastrium Tube secured with: ETT holder Chest x-ray interpreted by radiologist and me.  Chest x-ray findings: endotracheal tube in appropriate position  Patient tolerated the procedure well with no immediate complications.   CRITICAL CARE Performed by: Minna Antis   Total critical care time: 30 minutes  Critical care time was exclusive of separately billable procedures and treating other patients.  Critical care was necessary to treat or prevent imminent or life-threatening deterioration.  Critical care was time spent personally by me on the following activities: development of treatment plan with patient and/or surrogate as well as nursing, discussions with consultants, evaluation of patient's response to treatment, examination of patient, obtaining history from patient or surrogate, ordering and performing treatments and interventions, ordering and review of laboratory studies, ordering and review of radiographic studies, pulse oximetry and re-evaluation of patient's condition.  ____________________________________________   FINAL CLINICAL IMPRESSION(S) / ED DIAGNOSES  Respiratory failure Hypoxia   Minna Antis, MD 04/22/18 [phone]

## 2018-04-22 NOTE — ED Notes (Signed)
Date and time results received: 04/22/18 0709  Test: Troponin Critical Value: 0.06  Name of Provider Notified: [facility]

## 2018-04-22 NOTE — Consult Note (Signed)
Pharmacy Antibiotic Note  Kristina Archer is a 73 y.o. female admitted on 04/22/2018 with respiratory failure.  Pharmacy has been consulted for vancomycin and cefepime dosing. Acute hypoxic respiratory failure- secondary to pulmonary edema vs CAP vs COVID- no known exposures or fevers.  Plan:  1) Vancomycin 750 mg IV Q 24 hrs following 2000 mg loading dose  Goal AUC 400-550 BMI 33.9 Expected AUC: 481 SCr used: 1.05  2) cefepime 2 grams IV every 12 hours  Weight: 185 lb 3 oz (84 kg)  Temp (24hrs), Avg:98 F (36.7 C), Min:98 F (36.7 C), Max:98 F (36.7 C)  Recent Labs  Lab 04/22/18 0615  WBC 15.7*  CREATININE 1.05*  LATICACIDVEN 1.6    Estimated Creatinine Clearance: 50.1 mL/min (A) (by C-G formula based on SCr of 1.05 mg/dL (H)).    Antimicrobials this admission: vancomycin 4/12 >>  cefepime 4/12 >>  metronidazole 4/12 x1  Microbiology results: 4/12 BCx: pending 4/12 UCx: pending 4/12 Covid 19: pending   4/12 resp panel: pending  4/12 MRSA PCR: pending  Thank you for allowing pharmacy to be a part of this patient's care.  Lowella Bandy, PharmD 04/22/2018 8:42 AM

## 2018-04-23 ENCOUNTER — Inpatient Hospital Stay: Payer: Medicare Other

## 2018-04-23 LAB — GLUCOSE, CAPILLARY
Glucose-Capillary: 119 mg/dL — ABNORMAL HIGH (ref 70–99)
Glucose-Capillary: 138 mg/dL — ABNORMAL HIGH (ref 70–99)
Glucose-Capillary: 150 mg/dL — ABNORMAL HIGH (ref 70–99)
Glucose-Capillary: 155 mg/dL — ABNORMAL HIGH (ref 70–99)
Glucose-Capillary: 163 mg/dL — ABNORMAL HIGH (ref 70–99)
Glucose-Capillary: 87 mg/dL (ref 70–99)

## 2018-04-23 LAB — BASIC METABOLIC PANEL
Anion gap: 7 (ref 5–15)
BUN: 15 mg/dL (ref 8–23)
CO2: 28 mmol/L (ref 22–32)
Calcium: 9.2 mg/dL (ref 8.9–10.3)
Chloride: 106 mmol/L (ref 98–111)
Creatinine, Ser: 0.88 mg/dL (ref 0.44–1.00)
GFR calc Af Amer: >60 mL/min (ref >60)
GFR calc non Af Amer: >60 mL/min (ref >60)
Glucose, Bld: 126 mg/dL — ABNORMAL HIGH (ref 70–99)
Potassium: 2.7 mmol/L — CL (ref 3.5–5.1)
Sodium: 141 mmol/L (ref 135–145)

## 2018-04-23 LAB — TROPONIN I: Troponin I: 0.08 ng/mL (ref <0.03)

## 2018-04-23 LAB — PROCALCITONIN: Procalcitonin: 0.18 ng/mL

## 2018-04-23 LAB — CBC
HCT: 33.3 % — ABNORMAL LOW (ref 36.0–46.0)
Hemoglobin: 10.8 g/dL — ABNORMAL LOW (ref 12.0–15.0)
MCH: 26.3 pg (ref 26.0–34.0)
MCHC: 32.4 g/dL (ref 30.0–36.0)
MCV: 81.2 fL (ref 80.0–100.0)
Platelets: 114 10*3/uL — ABNORMAL LOW (ref 150–400)
RBC: 4.1 MIL/uL (ref 3.87–5.11)
RDW: 16.7 % — ABNORMAL HIGH (ref 11.5–15.5)
WBC: 11.3 10*3/uL — ABNORMAL HIGH (ref 4.0–10.5)
nRBC: 0 % (ref 0.0–0.2)

## 2018-04-23 LAB — SARS CORONAVIRUS 2 BY RT PCR (HOSPITAL ORDER, PERFORMED IN ~~LOC~~ HOSPITAL LAB): SARS Coronavirus 2: NEGATIVE

## 2018-04-23 LAB — PHOSPHORUS: Phosphorus: 3.3 mg/dL (ref 2.5–4.6)

## 2018-04-23 LAB — MAGNESIUM: Magnesium: 1.8 mg/dL (ref 1.7–2.4)

## 2018-04-23 LAB — POTASSIUM: Potassium: 4.1 mmol/L (ref 3.5–5.1)

## 2018-04-23 MED ORDER — AMIODARONE HCL 200 MG PO TABS
200.0000 mg | ORAL_TABLET | Freq: Every day | ORAL | Status: DC
  Administered 2018-04-24 – 2018-04-25 (×2): 200 mg via ORAL
  Filled 2018-04-23 (×2): qty 1

## 2018-04-23 MED ORDER — POTASSIUM CHLORIDE 20 MEQ PO PACK
40.0000 meq | PACK | ORAL | Status: DC
  Administered 2018-04-23 (×2): 40 meq via NASOGASTRIC
  Filled 2018-04-23 (×2): qty 2

## 2018-04-23 MED ORDER — SODIUM CHLORIDE 0.9 % IV SOLN
2.0000 g | Freq: Three times a day (TID) | INTRAVENOUS | Status: DC
  Administered 2018-04-23 – 2018-04-25 (×6): 2 g via INTRAVENOUS
  Filled 2018-04-23 (×10): qty 2

## 2018-04-23 MED ORDER — ORAL CARE MOUTH RINSE
15.0000 mL | Freq: Two times a day (BID) | OROMUCOSAL | Status: DC
  Administered 2018-04-23 – 2018-04-25 (×4): 15 mL via OROMUCOSAL

## 2018-04-23 MED ORDER — LISINOPRIL 5 MG PO TABS
5.0000 mg | ORAL_TABLET | Freq: Every day | ORAL | Status: DC
  Administered 2018-04-24 – 2018-04-25 (×2): 5 mg via ORAL
  Filled 2018-04-23 (×2): qty 1

## 2018-04-23 MED ORDER — TRAZODONE HCL 50 MG PO TABS
50.0000 mg | ORAL_TABLET | Freq: Every day | ORAL | Status: DC
  Administered 2018-04-23 – 2018-04-24 (×2): 50 mg via ORAL
  Filled 2018-04-23 (×2): qty 1

## 2018-04-23 NOTE — Consult Note (Signed)
Pharmacy Antibiotic Note  Kristina Archer is a 73 y.o. female admitted on 04/22/2018 with respiratory failure.  Pharmacy has been consulted for cefepime dosing. Acute hypoxic respiratory failure- secondary to pulmonary edema vs CAP vs COVID- no known exposures or fevers. Patient is currently intubated.  Plan: Increased cefepime to 2 grams IV every 8 hours  Continue Azithromycin 500 mg IV q24h.   Height: 5\' 5"  (165.1 cm) Weight: 167 lb 1.7 oz (75.8 kg) IBW/kg (Calculated) : 57  Temp (24hrs), Avg:99.1 F (37.3 C), Min:98.6 F (37 C), Max:99.8 F (37.7 C)  Recent Labs  Lab 04/22/18 0615 04/22/18 0832 04/22/18 0937 04/23/18 0435  WBC 15.7*  --  11.0* 11.3*  CREATININE 1.05*  --   --  0.88  LATICACIDVEN 1.6 1.5  --   --     Estimated Creatinine Clearance: 60.6 mL/min (by C-G formula based on SCr of 0.88 mg/dL).    Antimicrobials this admission: vancomycin 4/12 x 1  cefepime 4/12 >>  metronidazole 4/12 x1 Azithromycin 4/12 >>  Microbiology results: 4/12 BCx: NGTD 4/12 UCx: NGTD 4/12 Covid 19: pending   4/12 resp panel: no detection 4/12 MRSA PCR: (-)  Thank you for allowing pharmacy to be a part of this patient's care.   Mauri Reading, PharmD Pharmacy Resident  04/23/2018 12:50 PM

## 2018-04-23 NOTE — Progress Notes (Signed)
Per Dr Jayme Cloud start patient's PM trazadone and stop azithromycin d/t diarrhea

## 2018-04-23 NOTE — Progress Notes (Signed)
Patient extubated to 2l [place], per MD request, with no complications. Sats 95% at this time and no distress noted.

## 2018-04-24 DIAGNOSIS — J9611 Chronic respiratory failure with hypoxia: Secondary | ICD-10-CM

## 2018-04-24 DIAGNOSIS — Z515 Encounter for palliative care: Secondary | ICD-10-CM

## 2018-04-24 DIAGNOSIS — J9612 Chronic respiratory failure with hypercapnia: Secondary | ICD-10-CM

## 2018-04-24 DIAGNOSIS — J9601 Acute respiratory failure with hypoxia: Secondary | ICD-10-CM

## 2018-04-24 DIAGNOSIS — Z7189 Other specified counseling: Secondary | ICD-10-CM

## 2018-04-24 LAB — CBC WITH DIFFERENTIAL/PLATELET
Abs Immature Granulocytes: 0.02 10*3/uL (ref 0.00–0.07)
Basophils Absolute: 0.1 10*3/uL (ref 0.0–0.1)
Basophils Relative: 1 %
Eosinophils Absolute: 0.1 10*3/uL (ref 0.0–0.5)
Eosinophils Relative: 1 %
HCT: 29.1 % — ABNORMAL LOW (ref 36.0–46.0)
Hemoglobin: 9.2 g/dL — ABNORMAL LOW (ref 12.0–15.0)
Immature Granulocytes: 0 %
Lymphocytes Relative: 28 %
Lymphs Abs: 2 10*3/uL (ref 0.7–4.0)
MCH: 26.1 pg (ref 26.0–34.0)
MCHC: 31.6 g/dL (ref 30.0–36.0)
MCV: 82.7 fL (ref 80.0–100.0)
Monocytes Absolute: 0.7 10*3/uL (ref 0.1–1.0)
Monocytes Relative: 10 %
Neutro Abs: 4.3 10*3/uL (ref 1.7–7.7)
Neutrophils Relative %: 60 %
Platelets: 104 10*3/uL — ABNORMAL LOW (ref 150–400)
RBC: 3.52 MIL/uL — ABNORMAL LOW (ref 3.87–5.11)
RDW: 16.6 % — ABNORMAL HIGH (ref 11.5–15.5)
WBC: 7.1 10*3/uL (ref 4.0–10.5)
nRBC: 0 % (ref 0.0–0.2)

## 2018-04-24 LAB — CULTURE, RESPIRATORY W GRAM STAIN
Culture: NORMAL
Special Requests: NORMAL

## 2018-04-24 LAB — MAGNESIUM: Magnesium: 1.9 mg/dL (ref 1.7–2.4)

## 2018-04-24 LAB — BASIC METABOLIC PANEL
Anion gap: 8 (ref 5–15)
BUN: 18 mg/dL (ref 8–23)
CO2: 26 mmol/L (ref 22–32)
Calcium: 9.4 mg/dL (ref 8.9–10.3)
Chloride: 107 mmol/L (ref 98–111)
Creatinine, Ser: 0.96 mg/dL (ref 0.44–1.00)
GFR calc Af Amer: >60 mL/min (ref >60)
GFR calc non Af Amer: 60 mL/min — ABNORMAL LOW (ref >60)
Glucose, Bld: 97 mg/dL (ref 70–99)
Potassium: 3.4 mmol/L — ABNORMAL LOW (ref 3.5–5.1)
Sodium: 141 mmol/L (ref 135–145)

## 2018-04-24 LAB — PROCALCITONIN: Procalcitonin: 0.11 ng/mL

## 2018-04-24 LAB — GLUCOSE, CAPILLARY
Glucose-Capillary: 86 mg/dL (ref 70–99)
Glucose-Capillary: 99 mg/dL (ref 70–99)
Glucose-Capillary: 114 mg/dL — ABNORMAL HIGH (ref 70–99)
Glucose-Capillary: 140 mg/dL — ABNORMAL HIGH (ref 70–99)
Glucose-Capillary: 144 mg/dL — ABNORMAL HIGH (ref 70–99)

## 2018-04-24 LAB — LEGIONELLA PNEUMOPHILA SEROGP 1 UR AG: L. pneumophila Serogp 1 Ur Ag: NEGATIVE

## 2018-04-24 MED ORDER — SODIUM CHLORIDE 0.9 % IV SOLN
INTRAVENOUS | Status: DC | PRN
  Administered 2018-04-24: 250 mL via INTRAVENOUS
  Administered 2018-04-24: 10 mL via INTRAVENOUS
  Administered 2018-04-25: 250 mL via INTRAVENOUS
  Administered 2018-04-25: 10 mL via INTRAVENOUS

## 2018-04-24 MED ORDER — FUROSEMIDE 10 MG/ML IJ SOLN
20.0000 mg | Freq: Every day | INTRAMUSCULAR | Status: DC
  Administered 2018-04-24 – 2018-04-25 (×2): 20 mg via INTRAVENOUS
  Filled 2018-04-24 (×2): qty 2

## 2018-04-24 MED ORDER — POTASSIUM CHLORIDE CRYS ER 20 MEQ PO TBCR
40.0000 meq | EXTENDED_RELEASE_TABLET | Freq: Once | ORAL | Status: AC
  Administered 2018-04-24: 40 meq via ORAL
  Filled 2018-04-24: qty 2

## 2018-04-24 MED ORDER — FAMOTIDINE 20 MG PO TABS
20.0000 mg | ORAL_TABLET | Freq: Every day | ORAL | Status: DC
  Administered 2018-04-24: 20 mg via ORAL
  Filled 2018-04-24: qty 1

## 2018-04-24 MED ORDER — SODIUM CHLORIDE 0.9% FLUSH
3.0000 mL | Freq: Two times a day (BID) | INTRAVENOUS | Status: DC
  Administered 2018-04-24 – 2018-04-25 (×3): 3 mL via INTRAVENOUS

## 2018-04-24 MED ORDER — HYDROCODONE-ACETAMINOPHEN 5-325 MG PO TABS
1.0000 | ORAL_TABLET | Freq: Four times a day (QID) | ORAL | Status: DC | PRN
  Administered 2018-04-24: 1 via ORAL
  Filled 2018-04-24: qty 1

## 2018-04-24 NOTE — Progress Notes (Addendum)
New referral for outpatient Palliative to follow at home received from [facility]. Patient information faxed to referral. Thank you. [name] BSN, RN, [facility] [employer] [phone]

## 2018-04-24 NOTE — Evaluation (Signed)
Physical Therapy Evaluation Patient Details Name: Kristina Archer MRN: [id number] DOB: [birth year] Today's Date: 04/24/2018   History of Present Illness  Pt is a 73 y.o. female presenting to hospital 04/22/18 with SOB and respiratory distress; pt intubated initially but now extubated.  Pt admitted with acute hypoxic respiratory failure secondary pulmonary edema and CAP, sepsis, and acute exacerbation CHF.  COVID 19 testing negative.  PMH includes anxiety, cardiomyopathy, LBBB, PAF, PNA, DM, sciatica, and CHF.  Clinical Impression  Prior to hospital admission, pt was ambulatory (no AD in home; 4ww in community).  Pt lives alone in 3rd floor apt with elevator access.  Currently pt is modified independent semi-supine to sit; CGA with transfers; and CGA ambulating 80 feet in room with RW.  Generalized weakness noted but overall pt steady ambulating with RW.  O2 sats 95% or greater on room air during session's activities.  Pt would benefit from skilled PT to address noted impairments and functional limitations (see below for any additional details).  Upon hospital discharge, recommend pt discharge with HHPT.    Follow Up Recommendations Home health PT    Equipment Recommendations  (pt has own walker at home to use already)    Recommendations for Other Services       Precautions / Restrictions Precautions Precautions: Fall Restrictions Weight Bearing Restrictions: No      Mobility  Bed Mobility Overal bed mobility: Modified Independent             General bed mobility comments: Semi-supine to sit with mild increased effort; HOB elevated  Transfers Overall transfer level: Needs assistance Equipment used: Rolling walker (2 wheeled) Transfers: Sit to/from Stand Sit to Stand: Min guard         General transfer comment: fairly strong stand up to RW; steady  Ambulation/Gait Ambulation/Gait assistance: Min guard Gait Distance (Feet): 80 Feet(in room) Assistive device: Rolling  walker (2 wheeled)   Gait velocity: decreased   General Gait Details: steady with RW; partial B step through gait pattern initially but improved to step through with increased distance ambulating  Stairs            Wheelchair Mobility    Modified Rankin (Stroke Patients Only)       Balance Overall balance assessment: Needs assistance Sitting-balance support: No upper extremity supported;Feet supported Sitting balance-Leahy Scale: Normal Sitting balance - Comments: steady sitting reaching outside BOS   Standing balance support: No upper extremity supported Standing balance-Leahy Scale: Fair Standing balance comment: steady static standing                             Pertinent Vitals/Pain Pain Assessment: 0-10 Pain Score: 2  Pain Location: low back Pain Descriptors / Indicators: Sore Pain Intervention(s): Limited activity within patient's tolerance;Monitored during session;Repositioned  HR 59-68 bpm during session's activities.    Home Living Family/patient expects to be discharged to:: Private residence Living Arrangements: Alone Available Help at Discharge: Friend(s);Family;Available PRN/intermittently Type of Home: Apartment(3rd floor) Home Access: Elevator     Home Layout: One level Home Equipment: Grab bars - tub/shower;Grab bars - toilet;Walker - 4 wheels;Walker - 2 wheels      Prior Function Level of Independence: Needs assistance   Gait / Transfers Assistance Needed: No AD use in home for ambulating; uses 4ww in community; pt reports no recent falls           Hand Dominance        Extremity/Trunk  Assessment   Upper Extremity Assessment Upper Extremity Assessment: Generalized weakness    Lower Extremity Assessment Lower Extremity Assessment: Generalized weakness    Cervical / Trunk Assessment Cervical / Trunk Assessment: Normal  Communication   Communication: No difficulties  Cognition Arousal/Alertness:  Awake/alert Behavior During Therapy: WFL for tasks assessed/performed Overall Cognitive Status: Within Functional Limits for tasks assessed                                        General Comments   Nursing cleared pt for participation in physical therapy.  Pt agreeable to PT session.    Exercises     Assessment/Plan    PT Assessment Patient needs continued PT services  PT Problem List Decreased strength;Decreased activity tolerance;Decreased balance;Decreased mobility;Pain       PT Treatment Interventions DME instruction;Gait training;Functional mobility training;Therapeutic activities;Therapeutic exercise;Balance training;Patient/family education    PT Goals (Current goals can be found in the Care Plan section)  Acute Rehab PT Goals Patient Stated Goal: to go home PT Goal Formulation: With patient Time For Goal Achievement: 05/08/18 Potential to Achieve Goals: Good    Frequency Min 2X/week   Barriers to discharge        Co-evaluation               AM-PAC PT "6 Clicks" Mobility  Outcome Measure Help needed turning from your back to your side while in a flat bed without using bedrails?: None Help needed moving from lying on your back to sitting on the side of a flat bed without using bedrails?: None Help needed moving to and from a bed to a chair (including a wheelchair)?: A Little Help needed standing up from a chair using your arms (e.g., wheelchair or bedside chair)?: A Little Help needed to walk in hospital room?: A Little Help needed climbing 3-5 steps with a railing? : A Little 6 Click Score: 20    End of Session Equipment Utilized During Treatment: Gait belt Activity Tolerance: Patient tolerated treatment well Patient left: in chair;with call bell/phone within reach;with chair alarm set Nurse Communication: Mobility status;Precautions;Other (comment)(O2 sats) PT Visit Diagnosis: Other abnormalities of gait and mobility (R26.89);Muscle  weakness (generalized) (M62.81);Difficulty in walking, not elsewhere classified (R26.2)    Time: 1400-1430 PT Time Calculation (min) (ACUTE ONLY): 30 min   Charges:   PT Evaluation $PT Eval Low Complexity: 1 Low PT Treatments $Therapeutic Exercise: 8-22 mins        Hendricks LimesEmily Teylor Wolven, PT 04/24/18, 2:51 PM [phone]

## 2018-04-24 NOTE — Plan of Care (Signed)
  Problem: Education: Goal: Knowledge of General Education information will improve Description Including pain rating scale, medication(s)/side effects and non-pharmacologic comfort measures Outcome: Progressing   Problem: Activity: Goal: Risk for activity intolerance will decrease Outcome: Progressing   Problem: Safety: Goal: Ability to remain free from injury will improve Outcome: Progressing   

## 2018-04-24 NOTE — Consult Note (Signed)
Consultation Note Date: 04/24/2018   Kristina Archer Name: Kristina Archer  DOB: Nov 15, 1945  MRN: [id number]  Age / Sex: 73 y.o., female   PCP: Kristina Athens, MD Referring Physician: Sela Hua, MD   REASON FOR CONSULTATION:Establishing goals of care  Palliative Care consult requested for this 73 y.o. female with multiple medical problems including type 2 diabetes, hypertension, hyperlipidemia, chronic systolic congestive (EF 52-77%), atrial fibrillation, and GERD. She presented with shortness of breath and dry cough. She required intubation due to acute hypoxic respiratory failure secondary to pulmonary edema. COVID-19 was ruled out. Since admission Kristina Archer was successfully extubated on 04/22/12.   I have reviewed medical records including lab results, imaging, Epic notes, and MAR, received report from the bedside RN, and assessed the Kristina Archer. I met at the bedside with Kristina Archer to discuss diagnosis prognosis, Kristina Archer, EOL wishes, disposition and options. She is awake, alert, and oriented x3. Denied shortness of breath. Does endorse chronic lower back pain and requesting to resume home regimen of Vicodin as Tylenol alone is not providing comfort.   I introduced Palliative Medicine as specialized medical care for people living with serious illness. It focuses on providing relief from the symptoms and stress of a serious illness. The goal is to improve quality of life for both the Kristina Archer and the family.  We discussed a brief life review of the Kristina Archer, along with her overall functional and nutritional status. 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 She has 2 sons, one who is very involved in her care and the other lives in [place]. She is of [place] faith and her [name] called during my visit and offered her prayer. Prior to admission she reports living alone with a home health nurse coming in once a week. She states she was still driving and performing all ADLs independently. She  reports using a walker for stability.   We discussed Her current illness and what it means in the larger context of her on-going co-morbidities. With specific discussions regarding her acute respiratory failure and combined CHF.  Natural disease trajectory and expectations at EOL were discussed.  I attempted to elicit values and goals of care important to the Kristina Archer.    The difference between aggressive medical intervention and comfort care was considered in light of the Kristina Archer's goals of care. Kristina Archer expresses her goal is to continue with current treatment with hopes of improving and returning to her home with support (home health). She was pleased with her previous quality of life and felt she was able to perform most task for herself. She knows that her energy level and breathing has faced some challenges but yet remains hopeful.   She reports she does not have an official advance directive, however expresses since this admission has allowed her the opportunity to understand the importance of decisions. She reports her son Kristina Archer would most likely be her POA given he lives locally. We discussed her current Full Code status with consideration to her current illness, intubation, and co-morbidities. She expressed her awareness during ventilation and that it was awful. She expresses she does not wish to be intubated again, however, she does not want to change her code status until she is able to have a serious conversation with her sons and make sure they understand her future wishes.   Hospice and Palliative Care services outpatient were explained and offered. Kristina Archer verbalized their understanding and awareness of both hospice and palliative's goals and philosophy  of care. Given her wishes to proceed and continue with medical interventions and she has an appreciation for her current quality of life recommendations for palliative support at minimum was offered. Kristina Archer verbalized agreement and  expressed she would like to have support of outpatient palliative at discharge.   Questions and concerns were addressed. The family was encouraged to call with questions or concerns.  PMT will continue to support holistically.    SOCIAL HISTORY:     reports that she quit smoking about 20 years ago. Her smoking use included cigarettes. She has a 22.50 pack-year smoking history. She has never used smokeless tobacco. She reports previous alcohol use. She reports that she does not use drugs.  ADVANCE DIRECTIVES:  Primary Decision Maker: Kristina Archer  HCPOA: NO she verbalized son Kristina Archer would be HCPOA   CODE STATUS: Full code  SYMPTOM MANAGEMENT: Vicodin 1 tablet every 6 hrs as needed for pain, K-pad to lower back for chronic back pain relief    PSYCHO-SOCIAL/SPIRITUAL:  Support System: Family and local church   Desire for further Chaplaincy support:NO    PAST MEDICAL HISTORY: Past Medical History:  Diagnosis Date  . Anxiety   . Cardiomyopathy ([place])    a. 10/2017 Echo: EF 20-25%, diff HK, mild MR. Nl RV size.  . DDD (degenerative disc disease), lumbar   . Diverticulosis   . Elevated troponin   . Essential hypertension   . GERD (gastroesophageal reflux disease)   . History of kidney stones   . Hyperlipidemia   . LBBB (left bundle branch block)   . PAF (paroxysmal atrial fibrillation) ([place])    a.  Diagnosed 10/2017; b.  On Eliquis and amiodarone; c. CHADS2VASc => 5 (CHF, HTN, age x 1, DM, female)  . Pneumonia   . Sciatica   . Type II diabetes mellitus ([place])     PAST SURGICAL HISTORY:  Past Surgical History:  Procedure Laterality Date  . ABDOMINAL HYSTERECTOMY    . CHOLECYSTECTOMY    . CYSTOSCOPY W/ RETROGRADES Bilateral 05/24/2017   Procedure: CYSTOSCOPY WITH RETROGRADE PYELOGRAM;  Surgeon: Hollice Espy, MD;  Location: [facility];  Service: Urology;  Laterality: Bilateral;  . CYSTOSCOPY W/ RETROGRADES Right 01/29/2018   Procedure: CYSTOSCOPY WITH RETROGRADE PYELOGRAM;   Surgeon: Hollice Espy, MD;  Location: [facility];  Service: Urology;  Laterality: Right;  . CYSTOSCOPY W/ RETROGRADES Right 03/02/2018   Procedure: CYSTOSCOPY WITH RETROGRADE PYELOGRAM;  Surgeon: Billey Co, MD;  Location: [facility];  Service: Urology;  Laterality: Right;  . CYSTOSCOPY W/ RETROGRADES Right 03/30/2018   Procedure: CYSTOSCOPY WITH RETROGRADE PYELOGRAM;  Surgeon: Billey Co, MD;  Location: [facility];  Service: Urology;  Laterality: Right;  . CYSTOSCOPY W/ URETERAL STENT PLACEMENT Right 11/02/2017   Procedure: CYSTOSCOPY WITH RETROGRADE PYELOGRAM/URETERAL STENT PLACEMENT;  Surgeon: Lucas Mallow, MD;  Location: [facility];  Service: Urology;  Laterality: Right;  . CYSTOSCOPY WITH STENT PLACEMENT Right 03/02/2018   Procedure: CYSTOSCOPY WITH STENT PLACEMENT-RIGHT;  Surgeon: Billey Co, MD;  Location: [facility];  Service: Urology;  Laterality: Right;  . CYSTOSCOPY/URETEROSCOPY/HOLMIUM LASER/STENT PLACEMENT Left 05/24/2017   Procedure: CYSTOSCOPY/URETEROSCOPY/HOLMIUM LASER/STENT PLACEMENT;  Surgeon: Hollice Espy, MD;  Location: [facility];  Service: Urology;  Laterality: Left;  . CYSTOSCOPY/URETEROSCOPY/HOLMIUM LASER/STENT PLACEMENT Right 01/29/2018   Procedure: CYSTOSCOPY/URETEROSCOPY/HOLMIUM LASER/STENT Exchange;  Surgeon: Hollice Espy, MD;  Location: [facility];  Service: Urology;  Laterality: Right;  . CYSTOSCOPY/URETEROSCOPY/HOLMIUM LASER/STENT PLACEMENT Right 03/30/2018   Procedure: CYSTOSCOPY/URETEROSCOPY/HOLMIUM LASER/STENT EXCHANGE;  Surgeon: Nickolas Madrid  C, MD;  Location: [facility];  Service: Urology;  Laterality: Right;    ALLERGIES:  is allergic to codeine.  MEDICATIONS:  Current Facility-Administered Medications  Medication Dose Route Frequency Provider Last Rate Last Dose  . 0.9 %  sodium chloride infusion   Intravenous PRN Mayo, Pete Pelt, MD 10 mL/hr at 04/24/18 1324 250 mL at 04/24/18 1324  . acetaminophen (TYLENOL) tablet 650 mg  650 mg Per Tube Q6H  PRN Wilhelmina Mcardle, MD   650 mg at 04/24/18 7322   Or  . acetaminophen (TYLENOL) suppository 650 mg  650 mg Rectal Q6H PRN Wilhelmina Mcardle, MD      . amiodarone (PACERONE) tablet 200 mg  200 mg Oral QAC breakfast Charlett Nose, RPH   200 mg at 04/24/18 0254  . apixaban (ELIQUIS) tablet 5 mg  5 mg Per Tube BID Wilhelmina Mcardle, MD   5 mg at 04/24/18 2706  . carvedilol (COREG) tablet 3.125 mg  3.125 mg Oral BID WC Mayo, Pete Pelt, MD   3.125 mg at 04/24/18 2376  . ceFEPIme (MAXIPIME) 2 g in sodium chloride 0.9 % 100 mL IVPB  2 g Intravenous Q8H Cyndee Brightly M, RPH 200 mL/hr at 04/24/18 1325 2 g at 04/24/18 1325  . famotidine (PEPCID) IVPB 20 mg premix  20 mg Intravenous Q24H Wilhelmina Mcardle, MD   Stopped at 04/23/18 2241  . furosemide (LASIX) injection 20 mg  20 mg Intravenous Daily Mayo, Pete Pelt, MD      . insulin aspart (novoLOG) injection 0-15 Units  0-15 Units Subcutaneous Q4H Wilhelmina Mcardle, MD   3 Units at 04/23/18 1934  . lisinopril (PRINIVIL,ZESTRIL) tablet 5 mg  5 mg Oral Daily Charlett Nose, RPH   5 mg at 04/24/18 2831  . MEDLINE mouth rinse  15 mL Mouth Rinse BID Tyler Pita, MD   15 mL at 04/24/18 0823  . ondansetron (ZOFRAN) injection 4 mg  4 mg Intravenous Q6H PRN Mayo, Pete Pelt, MD      . PARoxetine (PAXIL) tablet 20 mg  20 mg Per Tube QHS Wilhelmina Mcardle, MD   20 mg at 04/23/18 2100  . polyethylene glycol (MIRALAX / GLYCOLAX) packet 17 g  17 g Oral Daily PRN Mayo, Pete Pelt, MD      . rosuvastatin (CRESTOR) tablet 40 mg  40 mg Per Tube Daily Wilhelmina Mcardle, MD   40 mg at 04/24/18 5176  . sodium chloride flush (NS) 0.9 % injection 3 mL  3 mL Intravenous Q12H Mayo, Pete Pelt, MD   3 mL at 04/24/18 0825  . traZODone (DESYREL) tablet 50 mg  50 mg Oral QHS Tyler Pita, MD   50 mg at 04/23/18 2100    VITAL SIGNS: BP (!) 152/59 (BP Location: Right Arm)   Pulse 62   Temp 99 F (37.2 C) (Oral)   Resp 20   Ht '5\' 5"'  (1.651 m)   Wt 74.9 kg   SpO2  95%   BMI 27.49 kg/m  Filed Weights   04/22/18 0715 04/22/18 1000 04/24/18 0534  Weight: 84 kg 75.8 kg 74.9 kg    Estimated body mass index is 27.49 kg/m as calculated from the following:   Height as of this encounter: '5\' 5"'  (1.651 m).   Weight as of this encounter: 74.9 kg.  LABS: CBC:    Component Value Date/Time   WBC 7.1 04/24/2018 0415   HGB 9.2 (L) 04/24/2018 1607  HGB 14.7 10/11/2011 0432   HCT 29.1 (L) 04/24/2018 0415   HCT 41.4 10/11/2011 0432   PLT 104 (L) 04/24/2018 0415   PLT 166 10/11/2011 0432   Comprehensive Metabolic Panel:    Component Value Date/Time   NA 141 04/24/2018 0415   NA 143 10/11/2011 0432   K 3.4 (L) 04/24/2018 0415   K 3.4 (L) 10/11/2011 0432   CO2 26 04/24/2018 0415   CO2 28 10/11/2011 0432   BUN 18 04/24/2018 0415   BUN 9 10/11/2011 0432   CREATININE 0.96 04/24/2018 0415   CREATININE 1.04 10/11/2011 0432   ALBUMIN 3.6 04/22/2018 0615   ALBUMIN 4.3 10/11/2011 0432     Review of Systems  Constitutional: Positive for activity change.  Respiratory: Positive for shortness of breath.   Musculoskeletal: Positive for back pain.  Neurological: Positive for weakness.  All other systems reviewed and are negative.  Unless otherwise noted, a complete review of systems is negative.  Physical Exam Vitals signs and nursing note reviewed.  Constitutional:      General: She is awake.     Appearance: She is obese.  Cardiovascular:     Rate and Rhythm: Normal rate.     Pulses: Normal pulses.     Heart sounds: Normal heart sounds.  Pulmonary:     Effort: Pulmonary effort is normal.     Breath sounds: Normal breath sounds.  Abdominal:     General: Bowel sounds are normal.     Palpations: Abdomen is soft.  Skin:    General: Skin is warm and dry.  Neurological:     Mental Status: She is alert and oriented to person, place, and time.  Psychiatric:        Cognition and Memory: Cognition normal.        Judgment: Judgment normal.       Prognosis: Guarded   Discharge Planning:  To Be Determined with outpatient palliative   Recommendations:  Full Code  Continue to treat  Outpatient Palliative at discharge  Vicodin 1 tablet every 6hrs as needed for pain  K-pad to lower back for pain/discomfort  PMT will continue to support and follow        Palliative Performance Scale: 50%              Kristina Archer expressed understanding and was in agreement with this plan.   Thank you for allowing the Palliative Medicine Team to assist in the care of this Kristina Archer.   Time In: 1245 Time Out: 1355 Total Time 65 min  Prolonged Time Billed  NO     Visit consisted of counseling and education dealing with the complex and emotionally intense issues of symptom management and palliative care in the setting of serious and potentially life-threatening illness.Greater than 50%  of this time was spent counseling and coordinating care related to the above assessment and plan.  Signed by:  Alda Lea, AGPCNP-BC Palliative Medicine Team  Phone: [phone] Fax: [phone] Pager: [phone] Amion: Bjorn Pippin

## 2018-04-24 NOTE — Progress Notes (Addendum)
[facility] [place] [facility]   PATIENT NAME: Kristina Archer    MR#:  [id number]  DATE OF BIRTH:  Jun 01, 1945  SUBJECTIVE:   Patient states she is feeling better this morning.  She is looking forward to being able to go home.  Her shortness of breath has improved.  She denies any cough.  She denies any fevers or chills.  She denies any chest pain.  REVIEW OF SYSTEMS:  Review of Systems  Constitutional: Negative for chills and fever.  HENT: Negative for congestion and sore throat.   Eyes: Negative for blurred vision and double vision.  Respiratory: Positive for shortness of breath. Negative for cough.   Cardiovascular: Negative for chest pain and leg swelling.  Gastrointestinal: Negative for nausea and vomiting.  Genitourinary: Negative for dysuria and urgency.  Musculoskeletal: Negative for back pain and neck pain.  Neurological: Negative for dizziness and headaches.  Psychiatric/Behavioral: Negative for depression. The patient is not nervous/anxious.     DRUG ALLERGIES:   Allergies  Allergen Reactions  . Codeine Nausea Only   VITALS:  Blood pressure (!) 152/59, pulse 62, temperature 99 F (37.2 C), temperature source Oral, resp. rate 20, height 5\' 5"  (1.651 m), weight 74.9 kg, SpO2 95 %. PHYSICAL EXAMINATION:  Physical Exam  GENERAL:  Laying in the bed with no acute distress.  HEENT: Head atraumatic, normocephalic. Pupils equal, round, reactive to light and accommodation. No scleral icterus. Extraocular muscles intact. Oropharynx and nasopharynx clear.  NECK:  Supple, no jugular venous distention. No thyroid enlargement. LUNGS: + Diminished breath sounds in the lung bases bilaterally.  Nasal cannula in place.  No crackles or wheezing.  Able to speak in full sentences. CARDIOVASCULAR: RRR, S1, S2 normal. No murmurs, rubs, or gallops.  ABDOMEN: Soft, nontender, nondistended. Bowel sounds present.  EXTREMITIES: No pedal edema, cyanosis, or clubbing.   NEUROLOGIC: CN 2-12 intact, no focal deficits. +global weakness. Sensation intact throughout. Gait not checked.  PSYCHIATRIC: The patient is alert and oriented x 3.  SKIN: No obvious rash, lesion, or ulcer.  LABORATORY PANEL:  Female CBC Recent Labs  Lab 04/24/18 0415  WBC 7.1  HGB 9.2*  HCT 29.1*  PLT 104*   ------------------------------------------------------------------------------------------------------------------ Chemistries  Recent Labs  Lab 04/22/18 0615  04/24/18 0415  NA 137   < > 141  K 3.5   < > 3.4*  CL 104   < > 107  CO2 24   < > 26  GLUCOSE 219*   < > 97  BUN 13   < > 18  CREATININE 1.05*   < > 0.96  CALCIUM 10.0   < > 9.4  MG  --    < > 1.9  AST 25  --   --   ALT 18  --   --   ALKPHOS 87  --   --   BILITOT 1.0  --   --    < > = values in this interval not displayed.   RADIOLOGY:  No results found. ASSESSMENT AND PLAN:   Acute hypoxic respiratory failure- secondary to pulmonary edema and CAP -COVID testing and RVP negative -Continue cefepime -Continue lasix 20mg  IV daily, can likely switch to p.o. Lasix tomorrow -Wean to room air as able -PT eval pending  Sepsis due to CAP- meeting sepsis criteria on admission, but this has resolved.  -Continue cefepime -Blood cultures with no growth  Acute exacerbation of chronic combined systolic and diastolic CHF- most recent echo 12/19  with EF 20 to 25% and grade 1 diastolic dysfunction. -Lasix as above  Paroxysmal atrial fibrillation-in normal sinus rhythm in the ED. -Continue amiodarone, Coreg, Eliquis  Hypertension-BPs elevated -Continue Coreg and lisinopril  Type 2 diabetes- blood sugars well-controlled -SSI  Hyperlipidemia-stable -Continue home Crestor  Normocytic anemia-hemoglobin has been trending down.  No active bleeding.  May be dilutional component. -Monitor  All the records are reviewed and case discussed with Care Management/Social Worker. Management plans discussed with the  patient, family and they are in agreement.  CODE STATUS: Full Code  TOTAL TIME TAKING CARE OF THIS PATIENT: 40 minutes.   More than 50% of the time was spent in counseling/coordination of care: YES  POSSIBLE D/C IN 1-2 DAYS, DEPENDING ON CLINICAL CONDITION.   Jinny BlossomKaty D Lash Matulich M.D on 04/24/2018 at 12:32 PM  Between 7am to 6pm - Pager - [phone]  After 6pm go to [email or web address] - Social research officer, governmentpassword [email or web address] [email or web address]  [facility] [place] [facility]  Office  [phone]  CC: Primary care physician; Corky DownsMasoud, [name], MD  Note: This dictation was prepared with Dragon dictation along with smaller phrase technology. Any transcriptional errors that result from this process are unintentional.

## 2018-04-24 NOTE — Progress Notes (Signed)
Foley catheter discontinued per order. Patient tolerated well. Educated on the importance of calling out when needing to void. Will continue to monitor.

## 2018-04-24 NOTE — TOC Initial Note (Signed)
Transition of Care [facility]) - Initial/Assessment Note    Patient Details  Name: Kristina Archer MRN: [id number] Date of Birth: 27-Jan-1945  Transition of Care [facility]) CM/SW Contact:    Sherren Kerns, RN Phone Number: 04/24/2018, 2:35 PM  Clinical Narrative:      From home alone and independent.  Current with PCP.  Obtains medications at [facility] without difficulty.  She has a walker at home if needed.  Her son will transport home at time of discharge.  Referral placed for out patient palliative to follow, [name] aware.   No home oxygen needs.  Denies issues with transportation.  Patient currently receives home health services [facility] for RN, PT, SW, OT and aide.   Will notify [facility] at DC.        Expected Discharge Plan: Home w Home Health Services Barriers to Discharge: Continued Medical Work up   Patient Goals and CMS Choice Patient states their goals for this hospitalization and ongoing recovery are:: Discharge home with current home health services.   CMS Medicare.gov Compare Post Acute Care list provided to:: Patient Choice offered to / list presented to : Patient  Expected Discharge Plan and Services Expected Discharge Plan: Home w Home Health Services   Discharge Planning Services: CM Consult Post Acute Care Choice: Home Health                       HH Arranged: RN, PT, Nurse's Aide    Prior Living Arrangements/Services   Lives with:: Self Patient language and need for interpreter reviewed:: Yes Do you feel safe going back to the place where you live?: Yes            Criminal Activity/Legal Involvement Pertinent to Current Situation/Hospitalization: No - Comment as needed  Activities of Daily Living Home Assistive Devices/Equipment: Dan Humphreys (specify type) ADL Screening (condition at time of admission) Patient's cognitive ability adequate to safely complete daily activities?: Yes Is the patient deaf or have difficulty hearing?:  No Does the patient have difficulty seeing, even when wearing glasses/contacts?: No Does the patient have difficulty concentrating, remembering, or making decisions?: No Patient able to express need for assistance with ADLs?: No Does the patient have difficulty dressing or bathing?: No Independently performs ADLs?: No Communication: Needs assistance Is this a change from baseline?: Change from baseline, expected to last >3 days Dressing (OT): Needs assistance Is this a change from baseline?: Change from baseline, expected to last >3 days Grooming: Needs assistance Is this a change from baseline?: Change from baseline, expected to last >3 days Feeding: Needs assistance Is this a change from baseline?: Change from baseline, expected to last >3 days Bathing: Dependent Is this a change from baseline?: Change from baseline, expected to last >3 days Toileting: Dependent Is this a change from baseline?: Change from baseline, expected to last >3days In/Out Bed: Needs assistance Is this a change from baseline?: Change from baseline, expected to last >3 days Walks in Home: Needs assistance Is this a change from baseline?: Change from baseline, expected to last >3 days Does the patient have difficulty walking or climbing stairs?: No Weakness of Legs: None Weakness of Arms/Hands: None  Permission Sought/Granted Permission sought to share information with : Facility Industrial/product designer granted to share information with : Yes, Verbal Permission Granted     Permission granted to share info w AGENCY: home health agencies        Emotional Assessment   Attitude/Demeanor/Rapport: Gracious  Affect (typically observed): Accepting Orientation: : Oriented to Self, Oriented to Place, Oriented to  Time, Oriented to Situation Alcohol / Substance Use: Not Applicable    Admission diagnosis:  Acute respiratory failure with hypoxia (HCC) [J96.01] Patient Active Problem List   Diagnosis  Date Noted  . Acute renal insufficiency   . Acute respiratory failure with hypoxia (HCC) 03/05/2018  . HLD (hyperlipidemia) 03/02/2018  . Paroxysmal atrial fibrillation (HCC) 03/02/2018  . GERD (gastroesophageal reflux disease) 03/02/2018  . Anxiety 03/02/2018  . Urinary tract infection with hematuria   . Pneumonia of both lower lobes due to infectious organism (HCC)   . Sepsis (HCC) 12/18/2017  . Acute on chronic systolic heart failure (HCC)   . Ureterolithiasis 11/02/2017   PCP:  Corky DownsMasoud, [name], MD Pharmacy:   [facility] [place] [address] [place] [id number] Phone: [phone] Fax: [phone]     Social Determinants of Health (SDOH) Interventions    Readmission Risk Interventions Readmission Risk Prevention Plan 04/24/2018  Transportation Screening Complete  PCP or Specialist Appt within 3-5 Days Complete  HRI or Home Care Consult Complete  Social Work Consult for Recovery Care Planning/Counseling Patient refused  Palliative Care Screening Complete  Medication Review Oceanographer(RN Care Manager) Complete  Some recent data might be hidden

## 2018-04-25 LAB — GLUCOSE, CAPILLARY
Glucose-Capillary: 114 mg/dL — ABNORMAL HIGH (ref 70–99)
Glucose-Capillary: 117 mg/dL — ABNORMAL HIGH (ref 70–99)
Glucose-Capillary: 119 mg/dL — ABNORMAL HIGH (ref 70–99)

## 2018-04-25 LAB — CBC
HCT: 30.1 % — ABNORMAL LOW (ref 36.0–46.0)
Hemoglobin: 9.7 g/dL — ABNORMAL LOW (ref 12.0–15.0)
MCH: 26.4 pg (ref 26.0–34.0)
MCHC: 32.2 g/dL (ref 30.0–36.0)
MCV: 82 fL (ref 80.0–100.0)
Platelets: 112 10*3/uL — ABNORMAL LOW (ref 150–400)
RBC: 3.67 MIL/uL — ABNORMAL LOW (ref 3.87–5.11)
RDW: 16.5 % — ABNORMAL HIGH (ref 11.5–15.5)
WBC: 6 10*3/uL (ref 4.0–10.5)
nRBC: 0 % (ref 0.0–0.2)

## 2018-04-25 LAB — BASIC METABOLIC PANEL
Anion gap: 9 (ref 5–15)
BUN: 18 mg/dL (ref 8–23)
CO2: 25 mmol/L (ref 22–32)
Calcium: 9.4 mg/dL (ref 8.9–10.3)
Chloride: 104 mmol/L (ref 98–111)
Creatinine, Ser: 0.79 mg/dL (ref 0.44–1.00)
GFR calc Af Amer: >60 mL/min (ref >60)
GFR calc non Af Amer: >60 mL/min (ref >60)
Glucose, Bld: 124 mg/dL — ABNORMAL HIGH (ref 70–99)
Potassium: 3.4 mmol/L — ABNORMAL LOW (ref 3.5–5.1)
Sodium: 138 mmol/L (ref 135–145)

## 2018-04-25 MED ORDER — AMOXICILLIN-POT CLAVULANATE 875-125 MG PO TABS: 1.0000 | ORAL_TABLET | Freq: Two times a day (BID) | ORAL | 0 refills | Status: AC

## 2018-04-25 MED ORDER — POTASSIUM CHLORIDE ER 10 MEQ PO TBCR: 10.0000 meq | EXTENDED_RELEASE_TABLET | Freq: Every day | ORAL | 0 refills | Status: DC

## 2018-04-25 NOTE — TOC Transition Note (Signed)
Transition of Care [facility]) - CM/SW Discharge Note   Patient Details  Name: Kristina Archer MRN: [id number] Date of Birth: 02-11-1945  Transition of Care [facility]) CM/SW Contact:  Sherren Kerns, RN Phone Number: 04/25/2018, 1:38 PM   Clinical Narrative:   Patient is discharging to home today with home health services.  [facility] is aware.  Will be followed by out patient palliative; [name] aware.     Final next level of care: Home w Home Health Services Barriers to Discharge: No Barriers Identified   Patient Goals and CMS Choice Patient states their goals for this hospitalization and ongoing recovery are:: Discharge home with current home health services.   CMS Medicare.gov Compare Post Acute Care list provided to:: Patient Choice offered to / list presented to : Patient  Discharge Placement                       Discharge Plan and Services   Discharge Planning Services: CM Consult Post Acute Care Choice: Home Health              HH Arranged: RN, PT, Nurse's Aide     Social Determinants of Health (SDOH) Interventions     Readmission Risk Interventions Readmission Risk Prevention Plan 04/24/2018  Transportation Screening Complete  PCP or Specialist Appt within 3-5 Days Complete  HRI or Home Care Consult Complete  Social Work Consult for Recovery Care Planning/Counseling Patient refused  Palliative Care Screening Complete  Medication Review [employer]) Complete  Some recent data might be hidden

## 2018-04-25 NOTE — Progress Notes (Signed)
Patient given discharge instructions. IV's taken out and tele monitor off. Patient verbalized understanding with no questions or concerns. Patient going home via family vehicle.

## 2018-04-25 NOTE — Progress Notes (Signed)
Physical Therapy Treatment Patient Details Name: Kristina Archer MRN: [id number] DOB: 06-27-1945 Today's Date: 04/25/2018    History of Present Illness Pt is a 73 y.o. female presenting to hospital 04/22/18 with SOB and respiratory distress; pt intubated initially but now extubated.  Pt admitted with acute hypoxic respiratory failure secondary pulmonary edema and CAP, sepsis, and acute exacerbation CHF.  COVID 19 testing negative.  PMH includes anxiety, cardiomyopathy, LBBB, PAF, PNA, DM, sciatica, and CHF.    PT Comments    Pt able to progress to ambulating 150 feet with RW in room CGA to SBA; pt appearing steady with decreased UE reliance on walker today (discussed trialing without RW but pt requesting to keep using RW for balance/safety).  Pt also able to ambulate to bathroom and back and demonstrated good balance with toileting hygiene and washing hands at sink.  O2 sats 94% or greater on room air with HR 56-68 bpm during session's activities.  Will continue to focus on strengthening, balance, and progressive functional mobility during hospital stay.    Follow Up Recommendations  Home health PT     Equipment Recommendations  (pt has own walker to use at home already)    Recommendations for Other Services       Precautions / Restrictions Precautions Precautions: Fall Restrictions Weight Bearing Restrictions: No    Mobility  Bed Mobility Overal bed mobility: Modified Independent             General bed mobility comments: Semi-supine to/from sit with mild increased effort; HOB elevated  Transfers Overall transfer level: Needs assistance Equipment used: Rolling walker (2 wheeled) Transfers: Sit to/from Stand Sit to Stand: Supervision         General transfer comment: fairly strong stand up to RW; steady; x2 trials from bed; x1 trial from [facility] (use of grab bar)  Ambulation/Gait Ambulation/Gait assistance: Min guard;Supervision Gait Distance (Feet): (150 feet in room;  25 feet x2 (to bathroom and back)) Assistive device: Rolling walker (2 wheeled) Gait Pattern/deviations: Step-through pattern Gait velocity: decreased   General Gait Details: steady with RW   Stairs             Wheelchair Mobility    Modified Rankin (Stroke Patients Only)       Balance Overall balance assessment: Needs assistance Sitting-balance support: No upper extremity supported;Feet supported Sitting balance-Leahy Scale: Normal Sitting balance - Comments: steady sitting reaching outside BOS   Standing balance support: No upper extremity supported Standing balance-Leahy Scale: Good Standing balance comment: steady standing washing hands at sink                            Cognition Arousal/Alertness: Awake/alert Behavior During Therapy: WFL for tasks assessed/performed Overall Cognitive Status: Within Functional Limits for tasks assessed                                        Exercises      General Comments   Nursing cleared pt for participation in physical therapy.  Pt agreeable to PT session.      Pertinent Vitals/Pain Pain Assessment: No/denies pain Pain Intervention(s): Limited activity within patient's tolerance;Monitored during session;Repositioned    Home Living                      Prior Function  PT Goals (current goals can now be found in the care plan section) Acute Rehab PT Goals Patient Stated Goal: to go home PT Goal Formulation: With patient Time For Goal Achievement: 05/08/18 Potential to Achieve Goals: Good Progress towards PT goals: Progressing toward goals    Frequency    Min 2X/week      PT Plan Current plan remains appropriate    Co-evaluation              AM-PAC PT "6 Clicks" Mobility   Outcome Measure  Help needed turning from your back to your side while in a flat bed without using bedrails?: None Help needed moving from lying on your back to sitting on the  side of a flat bed without using bedrails?: None Help needed moving to and from a bed to a chair (including a wheelchair)?: A Little Help needed standing up from a chair using your arms (e.g., wheelchair or bedside chair)?: A Little Help needed to walk in hospital room?: A Little Help needed climbing 3-5 steps with a railing? : A Little 6 Click Score: 20    End of Session Equipment Utilized During Treatment: Gait belt Activity Tolerance: Patient tolerated treatment well Patient left: in bed;with call bell/phone within reach;with bed alarm set Nurse Communication: Mobility status;Precautions(NT notified Purewick removed and pt requesting to have it replaced) PT Visit Diagnosis: Other abnormalities of gait and mobility (R26.89);Muscle weakness (generalized) (M62.81);Difficulty in walking, not elsewhere classified (R26.2)     Time: [id number] PT Time Calculation (min) (ACUTE ONLY): 30 min  Charges:  $Therapeutic Exercise: 8-22 mins $Therapeutic Activity: 8-22 mins                    Hendricks LimesEmily Othal Kubitz, PT 04/25/18, 12:25 PM [phone]

## 2018-04-26 LAB — GLUCOSE, CAPILLARY: Glucose-Capillary: 203 mg/dL — ABNORMAL HIGH (ref 70–99)

## 2018-04-27 LAB — CULTURE, BLOOD (ROUTINE X 2)
Culture: NO GROWTH
Culture: NO GROWTH

## 2018-05-01 ENCOUNTER — Telehealth: Payer: Self-pay

## 2018-05-01 NOTE — Telephone Encounter (Signed)
   TELEPHONE CALL NOTE  This patient has been deemed a candidate for follow-up tele-health visit to limit community exposure during the Covid-19 pandemic. I spoke with the patient via phone to discuss instructions. The patient was advised to review the section on consent for treatment as well. The patient will receive a phone call 2-3 days prior to their E-Visit at which time consent will be verbally confirmed. A Virtual Office Visit appointment type has been scheduled for 05/02/2018 with Clarisa Kindred FNP.  Vinnie Level, CMA 05/01/2018 10:32 AM

## 2018-05-01 NOTE — Telephone Encounter (Signed)
TELEPHONE CALL NOTE  Kristina Archer has been deemed a candidate for a follow-up tele-health visit to limit community exposure during the Covid-19 pandemic. I spoke with the patient via phone to ensure availability of phone/video source, confirm preferred email & phone number, discuss instructions and expectations, and review consent.   I reminded Kristina Archer to be prepared with any vital sign and/or heart rhythm information that could potentially be obtained via home monitoring, at the time of her visit.  Finally, I reminded Kristina Archer to expect an e-mail containing a link for their video-based visit approximately 15 minutes before her visit, or alternatively, a phone call at the time of her visit if her visit is planned to be a phone encounter.  Did the patient verbally consent to treatment as below? Yes  Kristina Archer, CMA 05/01/2018 10:33 AM  CONSENT FOR TELE-HEALTH VISIT - PLEASE REVIEW  I hereby voluntarily request, consent and authorize [facility] and its employed or contracted physicians, physician assistants, nurse practitioners or other licensed health care professionals (the Practitioner), to provide me with telemedicine health care services (the "Services") as deemed necessary by the treating Practitioner. I acknowledge and consent to receive the Services by the Practitioner via telemedicine. I understand that the telemedicine visit will involve communicating with the Practitioner through telephonic communication technology and the disclosure of certain medical information by electronic transmission. I acknowledge that I have been given the opportunity to request an in-person assessment or other available alternative prior to the telemedicine visit and am voluntarily participating in the telemedicine visit.  I understand that I have the right to withhold or withdraw my consent to the use of telemedicine in the course of my care at any time, without affecting my  right to future care or treatment, and that the Practitioner or I may terminate the telemedicine visit at any time. I understand that I have the right to inspect all information obtained and/or recorded in the course of the telemedicine visit and may receive copies of available information for a reasonable fee.  I understand that some of the potential risks of receiving the Services via telemedicine include:  Marland Kitchen Delay or interruption in medical evaluation due to technological equipment failure or disruption; . Information transmitted may not be sufficient (e.g. poor resolution of images) to allow for appropriate medical decision making by the Practitioner; and/or  . In rare instances, security protocols could fail, causing a breach of personal health information.  Furthermore, I acknowledge that it is my responsibility to provide information about my medical history, conditions and care that is complete and accurate to the best of my ability. I acknowledge that Practitioner's advice, recommendations, and/or decision may be based on factors not within their control, such as incomplete or inaccurate data provided by me or lack of visual representation. I understand that the practice of medicine is not an exact science and that Practitioner makes no warranties or guarantees regarding treatment outcomes. I acknowledge that I will receive a copy of this consent concurrently upon execution via email to the email address I last provided but may also request a printed copy by calling the office of [facility].    I understand that my insurance may be billed for this visit.   I have read or had this consent read to me. . I understand the contents of this consent, which adequately explains the benefits and risks of the Services being provided via telemedicine.  Marland Kitchen  I have been provided ample opportunity to ask questions regarding this consent and the Services and have had my questions answered to my  satisfaction. . I give my informed consent for the services to be provided through the use of telemedicine in my medical care  By participating in this telemedicine visit I agree to the above.

## 2018-05-02 ENCOUNTER — Ambulatory Visit: Payer: Medicare Other | Attending: Family | Admitting: Family

## 2018-05-02 ENCOUNTER — Encounter: Payer: Self-pay | Admitting: Family

## 2018-05-02 ENCOUNTER — Other Ambulatory Visit: Payer: Self-pay

## 2018-05-02 DIAGNOSIS — I5022 Chronic systolic (congestive) heart failure: Secondary | ICD-10-CM

## 2018-05-02 DIAGNOSIS — I10 Essential (primary) hypertension: Secondary | ICD-10-CM

## 2018-05-02 NOTE — Progress Notes (Signed)
Virtual Visit via Telephone Note    Evaluation Performed:  Initial visit  This visit type was conducted due to national recommendations for restrictions regarding the COVID-19 Pandemic (e.g. social distancing).  This format is felt to be most appropriate for this patient at this time.  All issues noted in this document were discussed and addressed.  No physical exam was performed (except for noted visual exam findings with Video Visits).  Please refer to the patient's chart (MyChart message for video visits and phone note for telephone visits) for the patient's consent to telehealth [facility]  Date:  05/02/2018   ID:  Kristina Archer, DOB January 15, 1945, MRN [id number]  Patient Location:  [address] [place] [id number]   Provider location:   [facility] [address] [place], [place] [address]  PCP:  Corky DownsMasoud, [name], MD  Cardiologist:  Julien Nordmannimothy Gollan, MD  Electrophysiologist:  None   Chief Complaint:  Minimal fatigue  History of Present Illness:    Kristina MooreBrenda A Archer is a 73 y.o. female who presents via audio/video conferencing for a telehealth visit today.  Patient verified DOB and address.  The patient does not have symptoms concerning for COVID-19 infection (fever, chills, cough, or new SHORTNESS OF BREATH).   Patient describes minimal fatigue upon exertion. She says that this has been present for several weeks but she does feel like her energy level is slowly improving. She has been walking twice daily with her walker up and down the hallways at home. She has associated dry cough along with this. She denies dizziness, swelling in legs/ abdomen, palpitations, chest pain, shortness of breath, rhinorrhea, sore throat, difficulty sleeping or weight gain. She currently has home health coming twice weekly.   Prior CV studies:   The following studies were reviewed today:  Echo report from 01/09/18 reviewed and showed an EF of  20-25%.  Past Medical History:  Diagnosis Date  . Anxiety   . Cardiomyopathy (HCC)    a. 10/2017 Echo: EF 20-25%, diff HK, mild MR. Nl RV size.  . DDD (degenerative disc disease), lumbar   . Diverticulosis   . Elevated troponin   . Essential hypertension   . GERD (gastroesophageal reflux disease)   . History of kidney stones   . Hyperlipidemia   . LBBB (left bundle branch block)   . PAF (paroxysmal atrial fibrillation) (HCC)    a.  Diagnosed 10/2017; b.  On Eliquis and amiodarone; c. CHADS2VASc => 5 (CHF, HTN, age x 1, DM, female)  . Pneumonia   . Sciatica   . Type II diabetes mellitus (HCC)    Past Surgical History:  Procedure Laterality Date  . ABDOMINAL HYSTERECTOMY    . CHOLECYSTECTOMY    . CYSTOSCOPY W/ RETROGRADES Bilateral 05/24/2017   Procedure: CYSTOSCOPY WITH RETROGRADE PYELOGRAM;  Surgeon: [name], [name], MD;  Location: [facility];  Service: Urology;  Laterality: Bilateral;  . CYSTOSCOPY W/ RETROGRADES Right 01/29/2018   Procedure: CYSTOSCOPY WITH RETROGRADE PYELOGRAM;  Surgeon: [name], [name], MD;  Location: [facility];  Service: Urology;  Laterality: Right;  . CYSTOSCOPY W/ RETROGRADES Right 03/02/2018   Procedure: CYSTOSCOPY WITH RETROGRADE PYELOGRAM;  Surgeon: Sondra ComeSninsky, Brian C, MD;  Location: [facility];  Service: Urology;  Laterality: Right;  . CYSTOSCOPY W/ RETROGRADES Right 03/30/2018   Procedure: CYSTOSCOPY WITH RETROGRADE PYELOGRAM;  Surgeon: Sondra ComeSninsky, Brian C, MD;  Location: [facility];  Service: Urology;  Laterality: Right;  . CYSTOSCOPY W/ URETERAL STENT PLACEMENT  Right 11/02/2017   Procedure: CYSTOSCOPY WITH RETROGRADE PYELOGRAM/URETERAL STENT PLACEMENT;  Surgeon: Crista Elliot, MD;  Location: [facility];  Service: Urology;  Laterality: Right;  . CYSTOSCOPY WITH STENT PLACEMENT Right 03/02/2018   Procedure: CYSTOSCOPY WITH STENT PLACEMENT-RIGHT;  Surgeon: Sondra Come, MD;  Location: [facility];  Service: Urology;  Laterality: Right;  .  CYSTOSCOPY/URETEROSCOPY/HOLMIUM LASER/STENT PLACEMENT Left 05/24/2017   Procedure: CYSTOSCOPY/URETEROSCOPY/HOLMIUM LASER/STENT PLACEMENT;  Surgeon: Vanna Scotland, MD;  Location: [facility];  Service: Urology;  Laterality: Left;  . CYSTOSCOPY/URETEROSCOPY/HOLMIUM LASER/STENT PLACEMENT Right 01/29/2018   Procedure: CYSTOSCOPY/URETEROSCOPY/HOLMIUM LASER/STENT Exchange;  Surgeon: Vanna Scotland, MD;  Location: [facility];  Service: Urology;  Laterality: Right;  . CYSTOSCOPY/URETEROSCOPY/HOLMIUM LASER/STENT PLACEMENT Right 03/30/2018   Procedure: CYSTOSCOPY/URETEROSCOPY/HOLMIUM LASER/STENT EXCHANGE;  Surgeon: Sondra Come, MD;  Location: [facility];  Service: Urology;  Laterality: Right;     Current Meds  Medication Sig  . amiodarone (PACERONE) 200 MG tablet Take 1 tablet (200 mg total) by mouth daily.  Marland Kitchen apixaban (ELIQUIS) 5 MG TABS tablet Take 1 tablet (5 mg total) by mouth 2 (two) times daily. (Patient taking differently: Take 5 mg by mouth daily. )  . carvedilol (COREG) 3.125 MG tablet Take 1 tablet (3.125 mg total) by mouth 2 (two) times daily with a meal.  . furosemide (LASIX) 20 MG tablet Take 20 mg by mouth daily.  Marland Kitchen HYDROcodone-acetaminophen (NORCO/VICODIN) 5-325 MG tablet Take 1-2 tablets by mouth every 6 (six) hours as needed for moderate pain. (Patient taking differently: Take 1-2 tablets by mouth every 4 (four) hours as needed for moderate pain. )  . lisinopril (PRINIVIL,ZESTRIL) 5 MG tablet TAKE ONE TABLET BY MOUTH EVERY DAY (Patient taking differently: Take 5 mg by mouth daily. )  . PARoxetine (PAXIL) 20 MG tablet Take 20 mg by mouth at bedtime.  . potassium chloride (K-DUR) 10 MEQ tablet Take 1 tablet (10 mEq total) by mouth daily.  . rosuvastatin (CRESTOR) 40 MG tablet Take 1 tablet (40 mg total) by mouth daily.  . sitaGLIPtin (JANUVIA) 100 MG tablet Take 100 mg by mouth daily.  . traZODone (DESYREL) 50 MG tablet Take 50 mg by mouth at bedtime.      Allergies:   Codeine   Social  History   Tobacco Use  . Smoking status: Former Smoker    Packs/day: 1.50    Years: 15.00    Pack years: 22.50    Types: Cigarettes    Last attempt to quit: 05/12/1997    Years since quitting: 20.9  . Smokeless tobacco: Never Used  Substance Use Topics  . Alcohol use: Not Currently  . Drug use: Never     Family Hx: The patient's family history includes Heart attack (age of onset: 76) in her father. There is no history of Bladder Cancer or Kidney cancer.  ROS:   Please see the history of present illness.     All other systems reviewed and are negative.   Labs/Other Tests and Data Reviewed:    Recent Labs: 02/01/2018: TSH 0.544 04/22/2018: ALT 18; B Natriuretic Peptide 1,504.0 04/24/2018: Magnesium 1.9 04/25/2018: BUN 18; Creatinine, Ser 0.79; Hemoglobin 9.7; Platelets 112; Potassium 3.4; Sodium 138   Recent Lipid Panel No results found for: CHOL, TRIG, HDL, CHOLHDL, LDLCALC, LDLDIRECT  Wt Readings from Last 3 Encounters:  04/25/18 163 lb (73.9 kg)  03/30/18 169 lb 12.1 oz (77 kg)  03/22/18 170 lb (77.1 kg)     Exam:    Vital Signs:  There were no  vitals taken for this visit.   Well nourished, well developed female in no  acute distress.   ASSESSMENT & PLAN:    1. Chronic heart failure with reduced ejection fraction- - NYHA class II - euvolemic today based on patient's description of symptoms - hasn't been weighing daily but does have scales; instructed patient to begin weighing daily and to call for an overnight weight gain of >2 pounds or a weekly weight gain of >5 pounds - not adding salt to her food and is trying to closely follow a low sodium diet    - discussed changing her lisinopril to entresto and / or titrating up her carvedilol at future visits as we can monitor her BP more closely - BNP from 04/22/2018 was 1504.0  2: HTN- - follows with PCP (Masoud) - BMP from 04/25/2018 reviewed and showed sodium 138, potassium 3.4, creatinine 0.79 and GFR  >60  COVID-19 Education: The signs and symptoms of COVID-19 were discussed with the patient and how to seek care for testing (follow up with PCP or arrange E-visit).  The importance of social distancing was discussed today.  Patient Risk:   After full review of this patients clinical status, I feel that they are at least moderate risk at this time.  Time:   Today, I have spent 20 minutes with the patient with telehealth technology discussing heart failure, medications, daily weight and symptoms to call about.     Medication Adjustments/Labs and Tests Ordered: Current medicines are reviewed at length with the patient today.  Concerns regarding medicines are outlined above.   Tests Ordered: No orders of the defined types were placed in this encounter.  Medication Changes: No orders of the defined types were placed in this encounter.   Disposition:  Follow-up in 6 weeks or sooner for any questions/problems before then  Signed, Delma Freeze, FNP  05/02/2018 1:24 PM    [facility]

## 2018-05-02 NOTE — Patient Instructions (Signed)
Begin weighing daily and call for an overnight weight gain of > 2 pounds or a weekly weight gain of >5 pounds. 

## 2018-05-03 ENCOUNTER — Telehealth: Payer: Medicare Other | Admitting: Family

## 2018-05-03 NOTE — Telephone Encounter (Signed)
Sending to scheduling pool and removing from the C19 cancel pool. 

## 2018-05-07 ENCOUNTER — Inpatient Hospital Stay (HOSPITAL_COMMUNITY)
Admit: 2018-05-07 | Discharge: 2018-05-07 | Disposition: A | Discharge status: Home / Self Care | Payer: Medicare Other | Attending: Internal Medicine | Admitting: Internal Medicine

## 2018-05-07 ENCOUNTER — Other Ambulatory Visit: Payer: Self-pay

## 2018-05-07 ENCOUNTER — Emergency Department: Payer: Medicare Other

## 2018-05-07 ENCOUNTER — Inpatient Hospital Stay
Admission: EM | Admit: 2018-05-07 | Discharge: 2018-05-10 | DRG: 291 | Disposition: A | Discharge status: Home Health | Payer: Medicare Other | Attending: Internal Medicine | Admitting: Internal Medicine

## 2018-05-07 DIAGNOSIS — I447 Left bundle-branch block, unspecified: Secondary | ICD-10-CM | POA: Diagnosis present

## 2018-05-07 DIAGNOSIS — Z79899 Other long term (current) drug therapy: Secondary | ICD-10-CM | POA: Diagnosis not present

## 2018-05-07 DIAGNOSIS — I480 Paroxysmal atrial fibrillation: Secondary | ICD-10-CM | POA: Diagnosis present

## 2018-05-07 DIAGNOSIS — Z885 Allergy status to narcotic agent status: Secondary | ICD-10-CM | POA: Diagnosis not present

## 2018-05-07 DIAGNOSIS — J9621 Acute and chronic respiratory failure with hypoxia: Secondary | ICD-10-CM | POA: Diagnosis present

## 2018-05-07 DIAGNOSIS — I4819 Other persistent atrial fibrillation: Secondary | ICD-10-CM | POA: Diagnosis present

## 2018-05-07 DIAGNOSIS — Z20828 Contact with and (suspected) exposure to other viral communicable diseases: Secondary | ICD-10-CM | POA: Diagnosis present

## 2018-05-07 DIAGNOSIS — M5136 Other intervertebral disc degeneration, lumbar region: Secondary | ICD-10-CM | POA: Diagnosis present

## 2018-05-07 DIAGNOSIS — I248 Other forms of acute ischemic heart disease: Secondary | ICD-10-CM | POA: Diagnosis present

## 2018-05-07 DIAGNOSIS — E876 Hypokalemia: Secondary | ICD-10-CM | POA: Diagnosis present

## 2018-05-07 DIAGNOSIS — Z87442 Personal history of urinary calculi: Secondary | ICD-10-CM | POA: Diagnosis not present

## 2018-05-07 DIAGNOSIS — N200 Calculus of kidney: Secondary | ICD-10-CM | POA: Diagnosis present

## 2018-05-07 DIAGNOSIS — I429 Cardiomyopathy, unspecified: Secondary | ICD-10-CM | POA: Diagnosis present

## 2018-05-07 DIAGNOSIS — I371 Nonrheumatic pulmonary valve insufficiency: Secondary | ICD-10-CM | POA: Diagnosis not present

## 2018-05-07 DIAGNOSIS — I110 Hypertensive heart disease with heart failure: Principal | ICD-10-CM | POA: Diagnosis present

## 2018-05-07 DIAGNOSIS — R0602 Shortness of breath: Secondary | ICD-10-CM | POA: Diagnosis present

## 2018-05-07 DIAGNOSIS — I340 Nonrheumatic mitral (valve) insufficiency: Secondary | ICD-10-CM

## 2018-05-07 DIAGNOSIS — I509 Heart failure, unspecified: Secondary | ICD-10-CM

## 2018-05-07 DIAGNOSIS — E785 Hyperlipidemia, unspecified: Secondary | ICD-10-CM | POA: Diagnosis present

## 2018-05-07 DIAGNOSIS — I5023 Acute on chronic systolic (congestive) heart failure: Secondary | ICD-10-CM | POA: Diagnosis present

## 2018-05-07 DIAGNOSIS — Z79891 Long term (current) use of opiate analgesic: Secondary | ICD-10-CM

## 2018-05-07 DIAGNOSIS — Z7901 Long term (current) use of anticoagulants: Secondary | ICD-10-CM

## 2018-05-07 DIAGNOSIS — Z87891 Personal history of nicotine dependence: Secondary | ICD-10-CM

## 2018-05-07 DIAGNOSIS — Z8249 Family history of ischemic heart disease and other diseases of the circulatory system: Secondary | ICD-10-CM

## 2018-05-07 DIAGNOSIS — Z7984 Long term (current) use of oral hypoglycemic drugs: Secondary | ICD-10-CM | POA: Diagnosis not present

## 2018-05-07 DIAGNOSIS — D649 Anemia, unspecified: Secondary | ICD-10-CM | POA: Diagnosis present

## 2018-05-07 DIAGNOSIS — E119 Type 2 diabetes mellitus without complications: Secondary | ICD-10-CM | POA: Diagnosis present

## 2018-05-07 LAB — LACTIC ACID, PLASMA
Lactic Acid, Venous: 1 mmol/L (ref 0.5–1.9)
Lactic Acid, Venous: 1 mmol/L (ref 0.5–1.9)

## 2018-05-07 LAB — BRAIN NATRIURETIC PEPTIDE: B Natriuretic Peptide: 1457 pg/mL — ABNORMAL HIGH (ref 0.0–100.0)

## 2018-05-07 LAB — BASIC METABOLIC PANEL
Anion gap: 8 (ref 5–15)
BUN: 11 mg/dL (ref 8–23)
CO2: 24 mmol/L (ref 22–32)
Calcium: 9.7 mg/dL (ref 8.9–10.3)
Chloride: 104 mmol/L (ref 98–111)
Creatinine, Ser: 0.95 mg/dL (ref 0.44–1.00)
GFR calc Af Amer: >60 mL/min (ref >60)
GFR calc non Af Amer: >60 mL/min (ref >60)
Glucose, Bld: 191 mg/dL — ABNORMAL HIGH (ref 70–99)
Potassium: 3.2 mmol/L — ABNORMAL LOW (ref 3.5–5.1)
Sodium: 136 mmol/L (ref 135–145)

## 2018-05-07 LAB — TROPONIN I: Troponin I: 0.05 ng/mL (ref <0.03)

## 2018-05-07 LAB — CBC WITH DIFFERENTIAL/PLATELET
Abs Immature Granulocytes: 0.03 10*3/uL (ref 0.00–0.07)
Basophils Absolute: 0.1 10*3/uL (ref 0.0–0.1)
Basophils Relative: 1 %
Eosinophils Absolute: 0 10*3/uL (ref 0.0–0.5)
Eosinophils Relative: 0 %
HCT: 33 % — ABNORMAL LOW (ref 36.0–46.0)
Hemoglobin: 11 g/dL — ABNORMAL LOW (ref 12.0–15.0)
Immature Granulocytes: 0 %
Lymphocytes Relative: 18 %
Lymphs Abs: 1.6 10*3/uL (ref 0.7–4.0)
MCH: 27.3 pg (ref 26.0–34.0)
MCHC: 33.3 g/dL (ref 30.0–36.0)
MCV: 81.9 fL (ref 80.0–100.0)
Monocytes Absolute: 0.5 10*3/uL (ref 0.1–1.0)
Monocytes Relative: 5 %
Neutro Abs: 7 10*3/uL (ref 1.7–7.7)
Neutrophils Relative %: 76 %
Platelets: 115 10*3/uL — ABNORMAL LOW (ref 150–400)
RBC: 4.03 MIL/uL (ref 3.87–5.11)
RDW: 16.9 % — ABNORMAL HIGH (ref 11.5–15.5)
WBC: 9.2 10*3/uL (ref 4.0–10.5)
nRBC: 0 % (ref 0.0–0.2)

## 2018-05-07 LAB — ECHOCARDIOGRAM COMPLETE
Height: 62 in
Weight: 2608 oz

## 2018-05-07 LAB — MAGNESIUM: Magnesium: 1.7 mg/dL (ref 1.7–2.4)

## 2018-05-07 LAB — SARS CORONAVIRUS 2 BY RT PCR (HOSPITAL ORDER, PERFORMED IN ~~LOC~~ HOSPITAL LAB): SARS Coronavirus 2: NEGATIVE

## 2018-05-07 MED ORDER — POTASSIUM CHLORIDE CRYS ER 10 MEQ PO TBCR
10.0000 meq | EXTENDED_RELEASE_TABLET | Freq: Two times a day (BID) | ORAL | Status: AC
  Administered 2018-05-08 (×2): 10 meq via ORAL
  Filled 2018-05-07 (×2): qty 1

## 2018-05-07 MED ORDER — ACETAMINOPHEN 650 MG RE SUPP: 650.0000 mg | Freq: Four times a day (QID) | RECTAL | Status: DC | PRN

## 2018-05-07 MED ORDER — POTASSIUM CHLORIDE CRYS ER 20 MEQ PO TBCR
30.0000 meq | EXTENDED_RELEASE_TABLET | Freq: Two times a day (BID) | ORAL | Status: AC
  Administered 2018-05-07 (×2): 30 meq via ORAL
  Filled 2018-05-07 (×2): qty 1

## 2018-05-07 MED ORDER — METHYLPREDNISOLONE SODIUM SUCC 125 MG IJ SOLR
125.0000 mg | Freq: Once | INTRAMUSCULAR | Status: AC
  Administered 2018-05-07: 08:00:00 125 mg via INTRAVENOUS
  Filled 2018-05-07 (×2): qty 2

## 2018-05-07 MED ORDER — LINAGLIPTIN 5 MG PO TABS
5.0000 mg | ORAL_TABLET | Freq: Every day | ORAL | Status: DC
  Administered 2018-05-07 – 2018-05-10 (×4): 5 mg via ORAL
  Filled 2018-05-07 (×4): qty 1

## 2018-05-07 MED ORDER — POTASSIUM CHLORIDE ER 10 MEQ PO TBCR
10.0000 meq | EXTENDED_RELEASE_TABLET | Freq: Every day | ORAL | Status: DC
  Administered 2018-05-07: 10 meq via ORAL
  Filled 2018-05-07 (×3): qty 1

## 2018-05-07 MED ORDER — BISACODYL 5 MG PO TBEC: 5.0000 mg | DELAYED_RELEASE_TABLET | Freq: Every day | ORAL | Status: DC | PRN

## 2018-05-07 MED ORDER — LOSARTAN POTASSIUM 25 MG PO TABS: 25.0000 mg | ORAL_TABLET | Freq: Every day | ORAL | Status: DC

## 2018-05-07 MED ORDER — LISINOPRIL 10 MG PO TABS
5.0000 mg | ORAL_TABLET | Freq: Every day | ORAL | Status: DC
  Administered 2018-05-07: 5 mg via ORAL
  Filled 2018-05-07: qty 1

## 2018-05-07 MED ORDER — SACUBITRIL-VALSARTAN 49-51 MG PO TABS
1.0000 | ORAL_TABLET | Freq: Two times a day (BID) | ORAL | Status: DC
  Administered 2018-05-09 – 2018-05-10 (×3): 1 via ORAL
  Filled 2018-05-07 (×4): qty 1

## 2018-05-07 MED ORDER — TRAZODONE HCL 50 MG PO TABS
25.0000 mg | ORAL_TABLET | Freq: Every evening | ORAL | Status: DC | PRN
  Administered 2018-05-07: 21:00:00 25 mg via ORAL
  Filled 2018-05-07: qty 1

## 2018-05-07 MED ORDER — PAROXETINE HCL 20 MG PO TABS
20.0000 mg | ORAL_TABLET | Freq: Every day | ORAL | Status: DC
  Administered 2018-05-07 – 2018-05-09 (×3): 20 mg via ORAL
  Filled 2018-05-07 (×4): qty 1

## 2018-05-07 MED ORDER — AMIODARONE HCL 200 MG PO TABS
200.0000 mg | ORAL_TABLET | Freq: Every day | ORAL | Status: DC
  Administered 2018-05-07 – 2018-05-10 (×4): 200 mg via ORAL
  Filled 2018-05-07 (×4): qty 1

## 2018-05-07 MED ORDER — ROSUVASTATIN CALCIUM 20 MG PO TABS
40.0000 mg | ORAL_TABLET | Freq: Every day | ORAL | Status: DC
  Administered 2018-05-07 – 2018-05-10 (×4): 40 mg via ORAL
  Filled 2018-05-07 (×2): qty 2
  Filled 2018-05-07 (×2): qty 4
  Filled 2018-05-07 (×2): qty 2
  Filled 2018-05-07 (×2): qty 4

## 2018-05-07 MED ORDER — SPIRONOLACTONE 25 MG PO TABS
25.0000 mg | ORAL_TABLET | Freq: Every day | ORAL | Status: DC
  Administered 2018-05-07 – 2018-05-10 (×4): 25 mg via ORAL
  Filled 2018-05-07 (×4): qty 1

## 2018-05-07 MED ORDER — HEPARIN SODIUM (PORCINE) 5000 UNIT/ML IJ SOLN: 5000.0000 [IU] | Freq: Three times a day (TID) | INTRAMUSCULAR | Status: DC

## 2018-05-07 MED ORDER — TRAZODONE HCL 50 MG PO TABS
50.0000 mg | ORAL_TABLET | Freq: Every day | ORAL | Status: DC
  Administered 2018-05-07 – 2018-05-09 (×3): 50 mg via ORAL
  Filled 2018-05-07 (×3): qty 1

## 2018-05-07 MED ORDER — OXYCODONE HCL 5 MG PO TABS
5.0000 mg | ORAL_TABLET | Freq: Once | ORAL | Status: AC
  Administered 2018-05-07: 5 mg via ORAL
  Filled 2018-05-07: qty 1

## 2018-05-07 MED ORDER — FUROSEMIDE 10 MG/ML IJ SOLN
40.0000 mg | Freq: Once | INTRAMUSCULAR | Status: AC
  Administered 2018-05-07: 10:00:00 40 mg via INTRAVENOUS
  Filled 2018-05-07 (×2): qty 4

## 2018-05-07 MED ORDER — SODIUM CHLORIDE 0.9 % IV BOLUS: 500.0000 mL | Freq: Once | INTRAVENOUS | Status: DC

## 2018-05-07 MED ORDER — APIXABAN 5 MG PO TABS
5.0000 mg | ORAL_TABLET | Freq: Every day | ORAL | Status: DC
  Administered 2018-05-07 – 2018-05-10 (×4): 5 mg via ORAL
  Filled 2018-05-07 (×4): qty 1

## 2018-05-07 MED ORDER — LOSARTAN POTASSIUM 25 MG PO TABS
25.0000 mg | ORAL_TABLET | Freq: Every day | ORAL | Status: AC
  Administered 2018-05-08: 25 mg via ORAL
  Filled 2018-05-07: qty 1

## 2018-05-07 MED ORDER — ONDANSETRON HCL 4 MG/2ML IJ SOLN: 4.0000 mg | Freq: Four times a day (QID) | INTRAMUSCULAR | Status: DC | PRN

## 2018-05-07 MED ORDER — CARVEDILOL 3.125 MG PO TABS
3.1250 mg | ORAL_TABLET | Freq: Two times a day (BID) | ORAL | Status: DC
  Administered 2018-05-07 – 2018-05-10 (×5): 3.125 mg via ORAL
  Filled 2018-05-07 (×6): qty 1

## 2018-05-07 MED ORDER — ACETAMINOPHEN 325 MG PO TABS
650.0000 mg | ORAL_TABLET | Freq: Four times a day (QID) | ORAL | Status: DC | PRN
  Administered 2018-05-07 – 2018-05-09 (×2): 650 mg via ORAL
  Filled 2018-05-07 (×2): qty 2

## 2018-05-07 MED ORDER — SACUBITRIL-VALSARTAN 49-51 MG PO TABS: 1.0000 | ORAL_TABLET | Freq: Two times a day (BID) | ORAL | Status: DC

## 2018-05-07 MED ORDER — FUROSEMIDE 10 MG/ML IJ SOLN
40.0000 mg | Freq: Two times a day (BID) | INTRAMUSCULAR | Status: DC
  Administered 2018-05-07 – 2018-05-09 (×4): 40 mg via INTRAVENOUS
  Filled 2018-05-07 (×4): qty 4

## 2018-05-07 MED ORDER — DOCUSATE SODIUM 100 MG PO CAPS
100.0000 mg | ORAL_CAPSULE | Freq: Two times a day (BID) | ORAL | Status: DC
  Administered 2018-05-07 – 2018-05-09 (×6): 100 mg via ORAL
  Filled 2018-05-07 (×7): qty 1

## 2018-05-07 MED ORDER — ONDANSETRON HCL 4 MG PO TABS
4.0000 mg | ORAL_TABLET | Freq: Four times a day (QID) | ORAL | Status: DC | PRN
  Administered 2018-05-07: 21:00:00 4 mg via ORAL
  Filled 2018-05-07: qty 1

## 2018-05-07 NOTE — ED Notes (Addendum)
Pt desats to 88% with standing and turning in bed- sat pt back down with head of bed elevated and sats improved to 91%

## 2018-05-07 NOTE — ED Provider Notes (Signed)
[facility] Emergency Department Provider Note ____________________________________________   First MD Initiated Contact with Patient 05/07/18 [phone]     (approximate)  I have reviewed the triage vital signs and the nursing notes.   HISTORY  Chief Complaint Shortness of Breath    HPI Kristina Archer is a 73 y.o. female with PMH as noted below including CHF who presents with shortness of breath, gradual onset over the last 2 days, associated with wheezing as well as with nonproductive cough.  The patient denies associated fever, chest pain, or any vomiting or diarrhea.  She states that she never quite felt back to normal after her recent hospital admission for respiratory failure and pneumonia.  Per EMS, the patient's O2 saturation was in the high 80s on room air.  Past Medical History:  Diagnosis Date  . Anxiety   . Cardiomyopathy (HCC)    a. 10/2017 Echo: EF 20-25%, diff HK, mild MR. Nl RV size.  . DDD (degenerative disc disease), lumbar   . Diverticulosis   . Elevated troponin   . Essential hypertension   . GERD (gastroesophageal reflux disease)   . History of kidney stones   . Hyperlipidemia   . LBBB (left bundle branch block)   . PAF (paroxysmal atrial fibrillation) (HCC)    a.  Diagnosed 10/2017; b.  On Eliquis and amiodarone; c. CHADS2VASc => 5 (CHF, HTN, age x 1, DM, female)  . Pneumonia   . Sciatica   . Type II diabetes mellitus [facility])     Patient Active Problem List   Diagnosis Date Noted  . Acute renal insufficiency   . Acute respiratory failure with hypoxia (HCC) 03/05/2018  . HLD (hyperlipidemia) 03/02/2018  . Paroxysmal atrial fibrillation (HCC) 03/02/2018  . GERD (gastroesophageal reflux disease) 03/02/2018  . Anxiety 03/02/2018  . Urinary tract infection with hematuria   . Pneumonia of both lower lobes due to infectious organism (HCC)   . Sepsis (HCC) 12/18/2017  . Acute on chronic systolic heart failure (HCC)   .  Ureterolithiasis 11/02/2017    Past Surgical History:  Procedure Laterality Date  . ABDOMINAL HYSTERECTOMY    . CHOLECYSTECTOMY    . CYSTOSCOPY W/ RETROGRADES Bilateral 05/24/2017   Procedure: CYSTOSCOPY WITH RETROGRADE PYELOGRAM;  Surgeon: Vanna Scotland, MD;  Location: [facility] ORS;  Service: Urology;  Laterality: Bilateral;  . CYSTOSCOPY W/ RETROGRADES Right 01/29/2018   Procedure: CYSTOSCOPY WITH RETROGRADE PYELOGRAM;  Surgeon: Vanna Scotland, MD;  Location: [facility] OR[facility];  Service: Urology;  Laterality: Right;  . CYSTOSCOPY W/ RETROGRADES Right 03/02/2018   Procedure: CYSTOSCOPY WITH RETROGRADE PYELOGRAM;  Surgeon: Sondra Come, MD;  Location: [facility] ORS;  Service: Urology;  Laterality: Right;  . CYSTOSCOPY W/ RETROGRADES Right 03/30/2018   Procedure: CYSTOSCOPY WITH RETROGRADE PYELOGRAM;  Surgeon: Sondra Come, MD;  Location: [facility] ORS;  Service: Urology;  Laterality: Right;  . CYSTOSCOPY W/ URETERAL STENT PLACEMENT Right 11/02/2017   Procedure: CYSTOSCOPY WITH RETROGRADE PYELOGRAM/URETERAL STENT PLACEMENT;  Surgeon: Crista Elliot, MD;  Location: [facility] OR[facility];  Service: Urology;  Laterality: Right;  . CYSTOSCOPY WITH STENT PLACEMENT Right 03/02/2018   Procedure: CYSTOSCOPY WITH STENT PLACEMENT-RIGHT;  Surgeon: Sondra Come, MD;  Location: [facility] OR[facility];  Service: Urology;  Laterality: Right;  . CYSTOSCOPY/URETEROSCOPY/HOLMIUM LASER/STENT PLACEMENT Left 05/24/2017   Procedure: CYSTOSCOPY/URETEROSCOPY/HOLMIUM LASER/STENT PLACEMENT;  Surgeon: Vanna Scotland, MD;  Location: [facility];  Service: Urology;  Laterality: Left;  . CYSTOSCOPY/URETEROSCOPY/HOLMIUM LASER/STENT PLACEMENT Right 01/29/2018   Procedure: CYSTOSCOPY/URETEROSCOPY/HOLMIUM LASER/STENT Exchange;  Surgeon:  Vanna Scotland, MD;  Location: [facility];  Service: Urology;  Laterality: Right;  . CYSTOSCOPY/URETEROSCOPY/HOLMIUM LASER/STENT PLACEMENT Right 03/30/2018   Procedure: CYSTOSCOPY/URETEROSCOPY/HOLMIUM LASER/STENT EXCHANGE;  Surgeon:  Sondra Come, MD;  Location: [facility];  Service: Urology;  Laterality: Right;    Prior to Admission medications   Medication Sig Start Date End Date Taking? Authorizing Provider  amiodarone (PACERONE) 200 MG tablet Take 1 tablet (200 mg total) by mouth daily. 01/09/18   Sondra Barges, PA-C  apixaban (ELIQUIS) 5 MG TABS tablet Take 1 tablet (5 mg total) by mouth 2 (two) times daily. Patient taking differently: Take 5 mg by mouth daily.  11/13/17   Katha Hamming, MD  carvedilol (COREG) 3.125 MG tablet Take 1 tablet (3.125 mg total) by mouth 2 (two) times daily with a meal. 11/13/17   Katha Hamming, MD  furosemide (LASIX) 20 MG tablet Take 20 mg by mouth daily.    [provider]  HYDROcodone-acetaminophen (NORCO/VICODIN) 5-325 MG tablet Take 1-2 tablets by mouth every 6 (six) hours as needed for moderate pain. Patient taking differently: Take 1-2 tablets by mouth every 4 (four) hours as needed for moderate pain.  01/29/18   Vanna Scotland, MD  lisinopril (PRINIVIL,ZESTRIL) 5 MG tablet TAKE ONE TABLET BY MOUTH EVERY DAY Patient taking differently: Take 5 mg by mouth daily.  04/02/18   Dunn, Raymon Mutton, PA-C  PARoxetine (PAXIL) 20 MG tablet Take 20 mg by mouth at bedtime.    [provider]  potassium chloride (K-DUR) 10 MEQ tablet Take 1 tablet (10 mEq total) by mouth daily. 04/25/18   Altamese Dilling, MD  rosuvastatin (CRESTOR) 40 MG tablet Take 1 tablet (40 mg total) by mouth daily. 11/13/17   Katha Hamming, MD  sitaGLIPtin (JANUVIA) 100 MG tablet Take 100 mg by mouth daily.    [provider]  traZODone (DESYREL) 50 MG tablet Take 50 mg by mouth at bedtime.  11/30/17   [provider]    Allergies Codeine  Family History  Problem Relation Age of Onset  . Heart attack Father 7  . Bladder Cancer Neg Hx   . Kidney cancer Neg Hx     Social History Social History   Tobacco Use  . Smoking status: Former Smoker    Packs/day: 1.50     Years: 15.00    Pack years: 22.50    Types: Cigarettes    Last attempt to quit: 05/12/1997    Years since quitting: 21.0  . Smokeless tobacco: Never Used  Substance Use Topics  . Alcohol use: Not Currently  . Drug use: Never    Review of Systems  Constitutional: No fever. Eyes: No redness. ENT: No sore throat. Cardiovascular: Denies chest pain. Respiratory: Positive for shortness of breath. Gastrointestinal: No vomiting or diarrhea.  Genitourinary: Positive for flank pain. Musculoskeletal: Negative for back pain. Skin: Negative for rash. Neurological: Negative for headache.   ____________________________________________   PHYSICAL EXAM:  VITAL SIGNS: ED Triage Vitals  Enc Vitals Group     BP 05/07/18 0743 (!) 175/84     Pulse Rate 05/07/18 0743 71     Resp --      Temp 05/07/18 0743 98 F (36.7 C)     Temp Source 05/07/18 0743 Oral     SpO2 05/07/18 0743 (!) 85 %     Weight 05/07/18 0749 163 lb (73.9 kg)     Height 05/07/18 0749 5\' 2"  (1.575 m)     Head Circumference --  Peak Flow --      Pain Score 05/07/18 0748 5     Pain Loc --      Pain Edu? --      Excl. in GC? --     Constitutional: Alert and oriented.  Slightly weak appearing but in no acute distress. Eyes: Conjunctivae are normal.  Head: Atraumatic. Nose: No congestion/rhinnorhea. Mouth/Throat: Mucous membranes are moist.   Neck: Normal range of motion.  Cardiovascular: Normal rate, regular rhythm. Grossly normal heart sounds.  Good peripheral circulation. Respiratory: Minimally increased respiratory effort.  No retractions.  Diminished breath sounds bilaterally. Gastrointestinal: Soft and nontender. No distention.  Genitourinary: No flank tenderness. Musculoskeletal: Trace bilateral lower extremity edema.  Extremities warm and well perfused.  Neurologic:  Normal speech and language. No gross focal neurologic deficits are appreciated.  Skin:  Skin is warm and dry. No rash  noted. Psychiatric: Mood and affect are normal. Speech and behavior are normal.  ____________________________________________   LABS (all labs ordered are listed, but only abnormal results are displayed)  Labs Reviewed  BASIC METABOLIC PANEL - Abnormal; Notable for the following components:      Result Value   Potassium 3.2 (*)    Glucose, Bld 191 (*)    All other components within normal limits  TROPONIN I - Abnormal; Notable for the following components:   Troponin I 0.05 (*)    All other components within normal limits  CBC WITH DIFFERENTIAL/PLATELET - Abnormal; Notable for the following components:   Hemoglobin 11.0 (*)    HCT 33.0 (*)    RDW 16.9 (*)    Platelets 115 (*)    All other components within normal limits  BRAIN NATRIURETIC PEPTIDE - Abnormal; Notable for the following components:   B Natriuretic Peptide 1,457.0 (*)    All other components within normal limits  SARS CORONAVIRUS 2 (HOSPITAL ORDER, PERFORMED IN [facility])  LACTIC ACID, PLASMA  LACTIC ACID, PLASMA   ____________________________________________  EKG  ED ECG REPORT I, Dionne BucySebastian Gailya Tauer, the attending physician, personally viewed and interpreted this ECG.  Date: 05/07/2018 EKG Time: 0849 Rate: 68 Rhythm: normal sinus rhythm QRS Axis: normal Intervals: LBBB ST/T Wave abnormalities: normal Narrative Interpretation: no evidence of acute ischemia; no significant change when compared to EKG of 04/22/2018  ____________________________________________  RADIOLOGY  CXR: Pulmonary vascular congestion  ____________________________________________   PROCEDURES  Procedure(s) performed: No  Procedures  Critical Care performed: Yes  CRITICAL CARE Performed by: Dionne BucySebastian Lakara Weiland   Total critical care time: 15 minutes  Critical care time was exclusive of separately billable procedures and treating other patients.  Critical care was necessary to treat or prevent imminent  or life-threatening deterioration.  Critical care was time spent personally by me on the following activities: development of treatment plan with patient and/or surrogate as well as nursing, discussions with consultants, evaluation of patient's response to treatment, examination of patient, obtaining history from patient or surrogate, ordering and performing treatments and interventions, ordering and review of laboratory studies, ordering and review of radiographic studies, pulse oximetry and re-evaluation of patient's condition. ____________________________________________   INITIAL IMPRESSION / ASSESSMENT AND PLAN / ED COURSE  Pertinent labs & imaging results that were available during my care of the patient were reviewed by me and considered in my medical decision making (see chart for details).  73 year old female with history of CHF and other PMH as noted above presents with worsening shortness of breath over the last few days associated with wheezing.  She has no fever or chest pain.  I reviewed the past medical records in Epic.  The patient was admitted on 04/22/2018 with pneumonia versus CHF and respiratory failure, and was intubated.  She was discharged on 4/15.  On exam today the patient is relatively well-appearing.  She is hypertensive, and her O2 saturation is in the mid 80s on room air.  Her other vital signs are normal.  She is speaking in full sentences.  She has diminished breath sounds bilaterally with no active wheezing at this time.  The remainder of the exam is as described above.  Differential includes CHF, pneumonia, or acute bronchitis.  The patient overall does not appear acutely fluid overloaded.  Given that she had some wheezing I suspect most likely bronchitis versus pneumonia.  Will obtain chest x-ray, lab work-up, give IV Solu-Medrol, and reassess.  Patient received albuterol in the field by EMS with improvement.  ----------------------------------------- 10:14 AM on  05/07/2018 -----------------------------------------  Chest x-ray and lab work-up are suggestive of acute on chronic CHF.  Given that the patient is hypoxic and not on home O2, she will require admission.  I ordered IV Lasix for diuresis.  I signed the patient out to the hospitalist Dr. Allena Katz.  __________________  Eustace Moore was evaluated in Emergency Department on 05/07/2018 for the symptoms described in the history of present illness. She was evaluated in the context of the global COVID-19 pandemic, which necessitated consideration that the patient might be at risk for infection with the SARS-CoV-2 virus that causes COVID-19. Institutional protocols and algorithms that pertain to the evaluation of patients at risk for COVID-19 are in a state of rapid change based on information released by regulatory bodies including the CDC and federal and state organizations. These policies and algorithms were followed during the patient's care in the ED. ____________________________________________   FINAL CLINICAL IMPRESSION(S) / ED DIAGNOSES  Final diagnoses:  Acute on chronic respiratory failure with hypoxia (HCC)  Acute on chronic congestive heart failure, unspecified heart failure type (HCC)      NEW MEDICATIONS STARTED DURING THIS VISIT:  New Prescriptions   No medications on file     Note:  This document was prepared using Dragon voice recognition software and may include unintentional dictation errors.    Dionne Bucy, MD 05/07/18 1015

## 2018-05-07 NOTE — Progress Notes (Signed)
Inpatient Diabetes Program Recommendations  AACE/ADA: New Consensus Statement on Inpatient Glycemic Control   Target Ranges:  Prepandial:   less than 140 mg/dL      Peak postprandial:   less than 180 mg/dL (1-2 hours)      Critically ill patients:  140 - 180 mg/dL   Results for Kristina Archer, Kristina Archer (MRN [id number]) as of 05/07/2018 12:52  Ref. Range 05/07/2018 08:03  Glucose Latest Ref Range: 70 - 99 mg/dL 235 (H)   Review of Glycemic Control  Diabetes history: DM2 Outpatient Diabetes medications: Januvia 100 mg daily Current orders for Inpatient glycemic control: Tradjenta 5 QD  Inpatient Diabetes Program Recommendations:  Insulin-Correction: While inpatient, please consider ordering CBGs with Novolog 0-9 units TID with meals and Novolog 0-5 units QHS.  Thanks, Orlando Penner, RN, MSN, CDE Diabetes Coordinator Inpatient Diabetes Program [phone] (Team Pager from 8am to 5pm)

## 2018-05-07 NOTE — ED Triage Notes (Signed)
Pt arrives via EMS for some shortness of breath and difficulty breathing- was treated a week ago for pneumonia and finished her antibiotics- states she is not feeling better- EMS states her O2 sats were 89% on room air and gave her a neb treatment and got her sats to 97%- pt currently having some shortness of breath

## 2018-05-07 NOTE — H&P (Signed)
[facility] - [place] at [facility]   PATIENT NAME: Kristina Archer    MR#:  [id number]  DATE OF BIRTH:  1945-09-30  DATE OF ADMISSION:  05/07/2018  PRIMARY CARE PHYSICIAN: Corky Downs, MD   REQUESTING/REFERRING PHYSICIAN: Dr. Marisa Severin  CHIEF COMPLAINT: Shortness of breath   Chief Complaint  Patient presents with  . Shortness of Breath    HISTORY OF PRESENT ILLNESS:  Kristina Archer  is a 73 y.o. female with a known history of systolic heart failure on small dose Lasix at home comes in because of worsening shortness of breath, cough going on for last few days associated with orthopnea, PND, toxic with O2 sats 89% on room air.  Patient recently was treated for pneumonia with antibiotics.  Found to have elevated BNP, admitting patient for CHF exacerbation.  PAST MEDICAL HISTORY:   Past Medical History:  Diagnosis Date  . Anxiety   . Cardiomyopathy (HCC)    a. 10/2017 Echo: EF 20-25%, diff HK, mild MR. Nl RV size.  . DDD (degenerative disc disease), lumbar   . Diverticulosis   . Elevated troponin   . Essential hypertension   . GERD (gastroesophageal reflux disease)   . History of kidney stones   . Hyperlipidemia   . LBBB (left bundle branch block)   . PAF (paroxysmal atrial fibrillation) (HCC)    a.  Diagnosed 10/2017; b.  On Eliquis and amiodarone; c. CHADS2VASc => 5 (CHF, HTN, age x 1, DM, female)  . Pneumonia   . Sciatica   . Type II diabetes mellitus (HCC)     PAST SURGICAL HISTOIRY:   Past Surgical History:  Procedure Laterality Date  . ABDOMINAL HYSTERECTOMY    . CHOLECYSTECTOMY    . CYSTOSCOPY W/ RETROGRADES Bilateral 05/24/2017   Procedure: CYSTOSCOPY WITH RETROGRADE PYELOGRAM;  Surgeon: Vanna Scotland, MD;  Location: [facility];  Service: Urology;  Laterality: Bilateral;  . CYSTOSCOPY W/ RETROGRADES Right 01/29/2018   Procedure: CYSTOSCOPY WITH RETROGRADE PYELOGRAM;  Surgeon: Vanna Scotland, MD;  Location: [facility];  Service: Urology;   Laterality: Right;  . CYSTOSCOPY W/ RETROGRADES Right 03/02/2018   Procedure: CYSTOSCOPY WITH RETROGRADE PYELOGRAM;  Surgeon: Sondra Come, MD;  Location: [facility];  Service: Urology;  Laterality: Right;  . CYSTOSCOPY W/ RETROGRADES Right 03/30/2018   Procedure: CYSTOSCOPY WITH RETROGRADE PYELOGRAM;  Surgeon: Sondra Come, MD;  Location: [facility];  Service: Urology;  Laterality: Right;  . CYSTOSCOPY W/ URETERAL STENT PLACEMENT Right 11/02/2017   Procedure: CYSTOSCOPY WITH RETROGRADE PYELOGRAM/URETERAL STENT PLACEMENT;  Surgeon: Crista Elliot, MD;  Location: [facility];  Service: Urology;  Laterality: Right;  . CYSTOSCOPY WITH STENT PLACEMENT Right 03/02/2018   Procedure: CYSTOSCOPY WITH STENT PLACEMENT-RIGHT;  Surgeon: Sondra Come, MD;  Location: [facility];  Service: Urology;  Laterality: Right;  . CYSTOSCOPY/URETEROSCOPY/HOLMIUM LASER/STENT PLACEMENT Left 05/24/2017   Procedure: CYSTOSCOPY/URETEROSCOPY/HOLMIUM LASER/STENT PLACEMENT;  Surgeon: Vanna Scotland, MD;  Location: [facility];  Service: Urology;  Laterality: Left;  . CYSTOSCOPY/URETEROSCOPY/HOLMIUM LASER/STENT PLACEMENT Right 01/29/2018   Procedure: CYSTOSCOPY/URETEROSCOPY/HOLMIUM LASER/STENT Exchange;  Surgeon: Vanna Scotland, MD;  Location: [facility];  Service: Urology;  Laterality: Right;  . CYSTOSCOPY/URETEROSCOPY/HOLMIUM LASER/STENT PLACEMENT Right 03/30/2018   Procedure: CYSTOSCOPY/URETEROSCOPY/HOLMIUM LASER/STENT EXCHANGE;  Surgeon: Sondra Come, MD;  Location: [facility];  Service: Urology;  Laterality: Right;    SOCIAL HISTORY:   Social History   Tobacco Use  . Smoking status: Former Smoker    Packs/day: 1.50    Years: 15.00  Pack years: 22.50    Types: Cigarettes    Last attempt to quit: 05/12/1997    Years since quitting: 21.0  . Smokeless tobacco: Never Used  Substance Use Topics  . Alcohol use: Not Currently    FAMILY HISTORY:   Family History  Problem Relation Age of Onset  . Heart attack Father  3  . Bladder Cancer Neg Hx   . Kidney cancer Neg Hx     DRUG ALLERGIES:   Allergies  Allergen Reactions  . Codeine Nausea Only    REVIEW OF SYSTEMS:  CONSTITUTIONAL: No fever, fatigue or weakness.  EYES: No blurred or double vision.  EARS, NOSE, AND THROAT: No tinnitus or ear pain.  RESPIRATORY: Cough, shortness of breath, orthopnea, PND. CARDIOVASCULAR: No chest pain, orthopnea, edema.  GASTROINTESTINAL: No nausea, vomiting, diarrhea or abdominal pain.  GENITOURINARY: No dysuria, hematuria.  ENDOCRINE: No polyuria, nocturia,  HEMATOLOGY: No anemia, easy bruising or bleeding SKIN: No rash or lesion. MUSCULOSKELETAL: No joint pain or arthritis.   NEUROLOGIC: No tingling, numbness, weakness.  PSYCHIATRY: No anxiety or depression.   MEDICATIONS AT HOME:   Prior to Admission medications   Medication Sig Start Date End Date Taking? Authorizing Provider  amiodarone (PACERONE) 200 MG tablet Take 1 tablet (200 mg total) by mouth daily. 01/09/18  Yes Dunn, Raymon Mutton, PA-C  apixaban (ELIQUIS) 5 MG TABS tablet Take 1 tablet (5 mg total) by mouth 2 (two) times daily. Patient taking differently: Take 5 mg by mouth daily.  11/13/17  Yes Katha Hamming, MD  carvedilol (COREG) 3.125 MG tablet Take 1 tablet (3.125 mg total) by mouth 2 (two) times daily with a meal. 11/13/17  Yes Katha Hamming, MD  furosemide (LASIX) 20 MG tablet Take 20 mg by mouth daily.   Yes [provider]  lisinopril (PRINIVIL,ZESTRIL) 5 MG tablet TAKE ONE TABLET BY MOUTH EVERY DAY Patient taking differently: Take 5 mg by mouth daily.  04/02/18  Yes Dunn, Raymon Mutton, PA-C  PARoxetine (PAXIL) 20 MG tablet Take 20 mg by mouth at bedtime.   Yes [provider]  potassium chloride (K-DUR) 10 MEQ tablet Take 1 tablet (10 mEq total) by mouth daily. 04/25/18  Yes Altamese Dilling, MD  rosuvastatin (CRESTOR) 40 MG tablet Take 1 tablet (40 mg total) by mouth daily. 11/13/17  Yes Katha Hamming, MD   sitaGLIPtin (JANUVIA) 100 MG tablet Take 100 mg by mouth daily.   Yes [provider]  traZODone (DESYREL) 50 MG tablet Take 50 mg by mouth at bedtime.  11/30/17  Yes [provider]      VITAL SIGNS:  Blood pressure (!) 159/71, pulse 63, temperature 98.2 F (36.8 C), temperature source Oral, height 5\' 2"  (1.575 m), weight 73.9 kg, SpO2 96 %.  PHYSICAL EXAMINATION:  GENERAL:  73 y.o.-year-old patient lying in the bed with no acute distress.  EYES: Pupils equal, round, reactive to light . No scleral icterus. Extraocular muscles intact.  HEENT: Head atraumatic, normocephalic. Oropharynx and nasopharynx clear.  NECK:  Supple, no jugular venous distention. No thyroid enlargement, no tenderness.  LUNGS: Diminished air entry bilaterally with basilar rales.  No wheezing, rales,rhonchi or crepitation. No use of accessory muscles of respiration.  CARDIOVASCULAR: S1, S2 normal. No murmurs, rubs, or gallops.  ABDOMEN: Soft, nontender, nondistended. Bowel sounds present. No organomegaly or mass.  EXTREMITIES: No pedal edema, cyanosis, or clubbing.  NEUROLOGIC: Cranial nerves II through XII are intact. Muscle strength 5/5 in all extremities. Sensation intact. Gait not checked.  PSYCHIATRIC: The patient is alert and oriented x 3.  SKIN: No obvious rash, lesion, or ulcer.   LABORATORY PANEL:   CBC Recent Labs  Lab 05/07/18 0803  WBC 9.2  HGB 11.0*  HCT 33.0*  PLT 115*   ------------------------------------------------------------------------------------------------------------------  Chemistries  Recent Labs  Lab 05/07/18 0803  NA 136  K 3.2*  CL 104  CO2 24  GLUCOSE 191*  BUN 11  CREATININE 0.95  CALCIUM 9.7   ------------------------------------------------------------------------------------------------------------------  Cardiac Enzymes Recent Labs  Lab 05/07/18 0803  TROPONINI 0.05*    ------------------------------------------------------------------------------------------------------------------  RADIOLOGY:  Dg Chest Portable 1 View  Result Date: 05/07/2018 CLINICAL DATA:  Increased shortness of breath.  History of diabetes. EXAM: PORTABLE CHEST 1 VIEW COMPARISON:  One-view chest x-ray 04/23/2018 FINDINGS: Heart is enlarged. Patient has been extubated. Diffuse interstitial pattern remains. Bilateral effusions are noted. Bibasilar airspace disease is worse left than right. IMPRESSION: 1. Interval extubation. 2. Cardiomegaly and mild pulmonary vascular congestion. This likely represents some element of congestive heart failure. 3. Bilateral pleural effusions and airspace disease, likely atelectasis. Electronically Signed   By: Marin Robertshristopher  Mattern M.D.   On: 05/07/2018 08:52    EKG:   Orders placed or performed during the hospital encounter of 05/07/18  . ED EKG  . ED EKG    IMPRESSION AND PLAN:   Acute respiratory failure with hypoxia secondary to CHF exacerbation: Continue oxygen, right now on 5 L of oxygen and saturation 92%.  COVID-19 test has been negative. 2.  Acute on chronic systolic heart failure, patient had echocardiogram done in December 2019, it showed EF of 20 to 25%, patient follows up with CHF clinic, doing telemedicine follow-up, she thinks that the Lasix dose is not enough at home, started on 40 mg IV Lasix twice daily, may have to go home with a higher dose of Lasix recently she is taking Lasix 20 mg daily.  Check daily weights, I&O's. 3.Hypokalemia: Replace the potassium. 4 slightly elevated troponin secondary to demand ischemia from CHF exacerbation. #5 chronic systolic heart failure with EF 20 to 25%, echocardiogram ordered, done today, follow, continue Coreg, continue low-salt diet, check clinic mention that starting Entresto should have lisinopril but was not started.  Cardiology consult placed.  [facility]  #6 Paroxysmal  Afib; Controlled with continue amiodarone, apixaban. All the records are reviewed and case discussed with ED provider. Management plans discussed with the patient, family and they are in agreement.  CODE STATUS:full  TOTAL TIME TAKING CARE OF THIS PATIENT:55 minutes.    Katha HammingSnehalatha Lemmie Steinhaus M.D on 05/07/2018 at 1:40 PM  Between 7am to 6pm - Pager - [phone]  After 6pm go to [email or web address] - password [email or web address] [facility]  [facility]  Office  [phone]  CC: Primary care physician; Corky DownsMasoud, [name], MD  Note: This dictation was prepared with Dragon dictation along with smaller phrase technology. Any transcriptional errors that result from this process are unintentional.

## 2018-05-07 NOTE — Progress Notes (Signed)
Please note, patient has a pending outpatient Palliative referral from last admission. [facility] Bevelyn Ngo made aware. [name] BSN, RN, [facility], [facility] collective [phone]

## 2018-05-07 NOTE — ED Notes (Signed)
Dr Marisa Severin notified of elevated troponin 0.05 - no new orders at this time

## 2018-05-07 NOTE — ED Notes (Signed)
Report given to Tammy RN  

## 2018-05-07 NOTE — ED Notes (Addendum)
Pt assisted with standing and getting on a bedside commode- pt O2 sats stayed around 92%

## 2018-05-07 NOTE — ED Notes (Signed)
ED TO INPATIENT HANDOFF REPORT  ED Nurse Name and Phone #: Perry Mount [phone]  S Name/Age/Gender Kristina Archer 73 y.o. female Room/Bed: ED31A/ED31A  Code Status   Code Status: Full Code  Home/SNF/Other Home Patient oriented to: self, place, time and situation Is this baseline? Yes   Triage Complete: Triage complete  Chief Complaint breathing difficulty  Triage Note Pt arrives via EMS for some shortness of breath and difficulty breathing- was treated a week ago for pneumonia and finished her antibiotics- states she is not feeling better- EMS states her O2 sats were 89% on room air and gave her a neb treatment and got her sats to 97%- pt currently having some shortness of breath   Allergies Allergies  Allergen Reactions  . Codeine Nausea Only    Level of Care/Admitting Diagnosis ED Disposition    ED Disposition Condition Comment   Admit  Hospital Area: [facility] [[id number]]  Level of Care: Telemetry [5]  Covid Evaluation: N/A  Diagnosis: Acute on chronic systolic heart failure (HCC) [428.23.ICD-9-CM]  Admitting Physician: Katha Hamming [[id number]]  Attending Physician: Katha Hamming [[id number]]  Estimated length of stay: past midnight tomorrow  Certification:: I certify this patient will need inpatient services for at least 2 midnights  PT Class (Do Not Modify): Inpatient [101]  PT Acc Code (Do Not Modify): Private [1]       B Medical/Surgery History Past Medical History:  Diagnosis Date  . Anxiety   . Cardiomyopathy (HCC)    a. 10/2017 Echo: EF 20-25%, diff HK, mild MR. Nl RV size.  . DDD (degenerative disc disease), lumbar   . Diverticulosis   . Elevated troponin   . Essential hypertension   . GERD (gastroesophageal reflux disease)   . History of kidney stones   . Hyperlipidemia   . LBBB (left bundle branch block)   . PAF (paroxysmal atrial fibrillation) (HCC)    a.  Diagnosed 10/2017; b.  On Eliquis and amiodarone; c.  CHADS2VASc => 5 (CHF, HTN, age x 1, DM, female)  . Pneumonia   . Sciatica   . Type II diabetes mellitus (HCC)    Past Surgical History:  Procedure Laterality Date  . ABDOMINAL HYSTERECTOMY    . CHOLECYSTECTOMY    . CYSTOSCOPY W/ RETROGRADES Bilateral 05/24/2017   Procedure: CYSTOSCOPY WITH RETROGRADE PYELOGRAM;  Surgeon: Vanna Scotland, MD;  Location: [facility];  Service: Urology;  Laterality: Bilateral;  . CYSTOSCOPY W/ RETROGRADES Right 01/29/2018   Procedure: CYSTOSCOPY WITH RETROGRADE PYELOGRAM;  Surgeon: Vanna Scotland, MD;  Location: [facility];  Service: Urology;  Laterality: Right;  . CYSTOSCOPY W/ RETROGRADES Right 03/02/2018   Procedure: CYSTOSCOPY WITH RETROGRADE PYELOGRAM;  Surgeon: Sondra Come, MD;  Location: [facility];  Service: Urology;  Laterality: Right;  . CYSTOSCOPY W/ RETROGRADES Right 03/30/2018   Procedure: CYSTOSCOPY WITH RETROGRADE PYELOGRAM;  Surgeon: Sondra Come, MD;  Location: [facility];  Service: Urology;  Laterality: Right;  . CYSTOSCOPY W/ URETERAL STENT PLACEMENT Right 11/02/2017   Procedure: CYSTOSCOPY WITH RETROGRADE PYELOGRAM/URETERAL STENT PLACEMENT;  Surgeon: Crista Elliot, MD;  Location: [facility];  Service: Urology;  Laterality: Right;  . CYSTOSCOPY WITH STENT PLACEMENT Right 03/02/2018   Procedure: CYSTOSCOPY WITH STENT PLACEMENT-RIGHT;  Surgeon: Sondra Come, MD;  Location: [facility];  Service: Urology;  Laterality: Right;  . CYSTOSCOPY/URETEROSCOPY/HOLMIUM LASER/STENT PLACEMENT Left 05/24/2017   Procedure: CYSTOSCOPY/URETEROSCOPY/HOLMIUM LASER/STENT PLACEMENT;  Surgeon: Vanna Scotland, MD;  Location: [facility];  Service: Urology;  Laterality: Left;  .  CYSTOSCOPY/URETEROSCOPY/HOLMIUM LASER/STENT PLACEMENT Right 01/29/2018   Procedure: CYSTOSCOPY/URETEROSCOPY/HOLMIUM LASER/STENT Exchange;  Surgeon: Vanna Scotland, MD;  Location: [facility];  Service: Urology;  Laterality: Right;  . CYSTOSCOPY/URETEROSCOPY/HOLMIUM LASER/STENT PLACEMENT Right  03/30/2018   Procedure: CYSTOSCOPY/URETEROSCOPY/HOLMIUM LASER/STENT EXCHANGE;  Surgeon: Sondra Come, MD;  Location: [facility];  Service: Urology;  Laterality: Right;     A IV Location/Drains/Wounds Patient Lines/Drains/Airways Status   Active Line/Drains/Airways    Name:   Placement date:   Placement time:   Site:   Days:   Peripheral IV 05/07/18 Right Antecubital   05/07/18    0740    Antecubital   less than 1   External Urinary Catheter   05/07/18    0935    -   less than 1   Incision (Closed) 03/30/18 Vagina Other (Comment)   03/30/18    0932     38          Intake/Output Last 24 hours No intake or output data in the 24 hours ending 05/07/18 1036  Labs/Imaging Results for orders placed or performed during the hospital encounter of 05/07/18 (from the past 48 hour(s))  Brain natriuretic peptide     Status: Abnormal   Collection Time: 05/07/18  7:49 AM  Result Value Ref Range   B Natriuretic Peptide 1,457.0 (H) 0.0 - 100.0 pg/mL    Comment: Performed at [facility], [address] [place], [place] [address]  Basic metabolic panel     Status: Abnormal   Collection Time: 05/07/18  8:03 AM  Result Value Ref Range   Sodium 136 135 - 145 mmol/L   Potassium 3.2 (L) 3.5 - 5.1 mmol/L   Chloride 104 98 - 111 mmol/L   CO2 24 22 - 32 mmol/L   Glucose, Bld 191 (H) 70 - 99 mg/dL   BUN 11 8 - 23 mg/dL   Creatinine, Ser 8.81 0.44 - 1.00 mg/dL   Calcium 9.7 8.9 - 10.3 mg/dL   GFR calc non Af Amer >60 >60 mL/min   GFR calc Af Amer >60 >60 mL/min   Anion gap 8 5 - 15    Comment: Performed at [facility], [address] [place], [place] [address]  Troponin I - Once     Status: Abnormal   Collection Time: 05/07/18  8:03 AM  Result Value Ref Range   Troponin I 0.05 (HH) <0.03 ng/mL    Comment: CRITICAL RESULT CALLED TO, READ BACK BY AND VERIFIED WITH TERESA CLAPP AT 0855 05/07/2018 DAS Performed at [facility], [address] [place], [place]  [address]   CBC with Differential     Status: Abnormal   Collection Time: 05/07/18  8:03 AM  Result Value Ref Range   WBC 9.2 4.0 - 10.5 K/uL   RBC 4.03 3.87 - 5.11 MIL/uL   Hemoglobin 11.0 (L) 12.0 - 15.0 g/dL   HCT 24.4 (L) 62.8 - 63.8 %   MCV 81.9 80.0 - 100.0 fL   MCH 27.3 26.0 - 34.0 pg   MCHC 33.3 30.0 - 36.0 g/dL   RDW 17.7 (H) 11.6 - 57.9 %   Platelets 115 (L) 150 - 400 K/uL    Comment: Immature Platelet Fraction may be clinically indicated, consider ordering this additional test [id number]    nRBC 0.0 0.0 - 0.2 %   Neutrophils Relative % 76 %   Neutro Abs 7.0 1.7 - 7.7 K/uL   Lymphocytes Relative 18 %   Lymphs Abs 1.6 0.7 -  4.0 K/uL   Monocytes Relative 5 %   Monocytes Absolute 0.5 0.1 - 1.0 K/uL   Eosinophils Relative 0 %   Eosinophils Absolute 0.0 0.0 - 0.5 K/uL   Basophils Relative 1 %   Basophils Absolute 0.1 0.0 - 0.1 K/uL   Immature Granulocytes 0 %   Abs Immature Granulocytes 0.03 0.00 - 0.07 K/uL    Comment: Performed at [facility], [address], [place], [place] [address]  SARS Coronavirus 2 [facility] order, Performed in [facility] hospital lab)     Status: None   Collection Time: 05/07/18  8:07 AM  Result Value Ref Range   SARS Coronavirus 2 NEGATIVE NEGATIVE    Comment: (NOTE) If result is NEGATIVE SARS-CoV-2 target nucleic acids are NOT DETECTED. The SARS-CoV-2 RNA is generally detectable in upper and lower  respiratory specimens during the acute phase of infection. The lowest  concentration of SARS-CoV-2 viral copies this assay can detect is 250  copies / mL. A negative result does not preclude SARS-CoV-2 infection  and should not be used as the sole basis for treatment or other  patient management decisions.  A negative result may occur with  improper specimen collection / handling, submission of specimen other  than nasopharyngeal swab, presence of viral mutation(s) within the  areas targeted by this assay, and inadequate number of  viral copies  (<250 copies / mL). A negative result must be combined with clinical  observations, patient history, and epidemiological information. If result is POSITIVE SARS-CoV-2 target nucleic acids are DETECTED. The SARS-CoV-2 RNA is generally detectable in upper and lower  respiratory specimens dur ing the acute phase of infection.  Positive  results are indicative of active infection with SARS-CoV-2.  Clinical  correlation with patient history and other diagnostic information is  necessary to determine patient infection status.  Positive results do  not rule out bacterial infection or co-infection with other viruses. If result is PRESUMPTIVE POSTIVE SARS-CoV-2 nucleic acids MAY BE PRESENT.   A presumptive positive result was obtained on the submitted specimen  and confirmed on repeat testing.  While 2019 novel coronavirus  (SARS-CoV-2) nucleic acids may be present in the submitted sample  additional confirmatory testing may be necessary for epidemiological  and / or clinical management purposes  to differentiate between  SARS-CoV-2 and other Sarbecovirus currently known to infect humans.  If clinically indicated additional testing with an alternate test  methodology [phone]) is advised. The SARS-CoV-2 RNA is generally  detectable in upper and lower respiratory sp ecimens during the acute  phase of infection. The expected result is Negative. Fact Sheet for Patients:  BoilerBrush.com.cy Fact Sheet for Healthcare Providers: https://pope.com/ This test is not yet approved or cleared by the [place] FDA and has been authorized for detection and/or diagnosis of SARS-CoV-2 by FDA under an Emergency Use Authorization (EUA).  This EUA will remain in effect (meaning this test can be used) for the duration of the COVID-19 declaration under Section 564(b)(1) of the Act, 21 U.S.C. section 360bbb-3(b)(1), unless the authorization is  terminated or revoked sooner. Performed at [facility], [address] [place], [place] [address]   Lactic acid, plasma     Status: None   Collection Time: 05/07/18  8:18 AM  Result Value Ref Range   Lactic Acid, Venous 1.0 0.5 - 1.9 mmol/L    Comment: Performed at [facility], [address], [place], [place] [address]   Dg Chest Portable 1 View  Result  Date: 05/07/2018 CLINICAL DATA:  Increased shortness of breath.  History of diabetes. EXAM: PORTABLE CHEST 1 VIEW COMPARISON:  One-view chest x-ray 04/23/2018 FINDINGS: Heart is enlarged. Patient has been extubated. Diffuse interstitial pattern remains. Bilateral effusions are noted. Bibasilar airspace disease is worse left than right. IMPRESSION: 1. Interval extubation. 2. Cardiomegaly and mild pulmonary vascular congestion. This likely represents some element of congestive heart failure. 3. Bilateral pleural effusions and airspace disease, likely atelectasis. Electronically Signed   By: Marin Roberts M.D.   On: 05/07/2018 08:52    Pending Labs Unresulted Labs (From admission, onward)    Start     Ordered   05/08/18 0500  Basic metabolic panel  Daily,   STAT     05/07/18 1032   05/08/18 0500  Basic metabolic panel  Tomorrow morning,   STAT     05/07/18 1032   05/08/18 0500  CBC  Tomorrow morning,   STAT     05/07/18 1032   05/07/18 1029  CBC  (heparin)  Once,   STAT    Comments:  Baseline for heparin therapy IF NOT ALREADY DRAWN.  Notify MD if PLT < 100 K.    05/07/18 1032   05/07/18 1029  Creatinine, serum  (heparin)  Once,   STAT    Comments:  Baseline for heparin therapy IF NOT ALREADY DRAWN.    05/07/18 1032   05/07/18 0746  Lactic acid, plasma  Now then every 2 hours,   STAT     05/07/18 0745          Vitals/Pain Today's Vitals   05/07/18 1011 05/07/18 1013 05/07/18 1021 05/07/18 1035  BP:    (!) 181/94  Pulse:    71  Temp:      TempSrc:      SpO2: (!) 88% 92% 92% 94%  Weight:       Height:      PainSc:        Isolation Precautions No active isolations  Medications Medications  amiodarone (PACERONE) tablet 200 mg (has no administration in time range)  carvedilol (COREG) tablet 3.125 mg (has no administration in time range)  lisinopril (ZESTRIL) tablet 5 mg (has no administration in time range)  PARoxetine (PAXIL) tablet 20 mg (has no administration in time range)  rosuvastatin (CRESTOR) tablet 40 mg (has no administration in time range)  linagliptin (TRADJENTA) tablet 5 mg (has no administration in time range)  traZODone (DESYREL) tablet 50 mg (has no administration in time range)  apixaban (ELIQUIS) tablet 5 mg (has no administration in time range)  potassium chloride (K-DUR) CR tablet 10 mEq (has no administration in time range)  acetaminophen (TYLENOL) tablet 650 mg (has no administration in time range)    Or  acetaminophen (TYLENOL) suppository 650 mg (has no administration in time range)  traZODone (DESYREL) tablet 25 mg (has no administration in time range)  docusate sodium (COLACE) capsule 100 mg (has no administration in time range)  bisacodyl (DULCOLAX) EC tablet 5 mg (has no administration in time range)  ondansetron (ZOFRAN) tablet 4 mg (has no administration in time range)    Or  ondansetron (ZOFRAN) injection 4 mg (has no administration in time range)  methylPREDNISolone sodium succinate (SOLU-MEDROL) 125 mg/2 mL injection 125 mg (125 mg Intravenous Given 05/07/18 0811)  furosemide (LASIX) injection 40 mg (40 mg Intravenous Given 05/07/18 0936)    Mobility walks Low fall risk   Focused Assessments Cardiac Assessment Handoff:    Lab Results  Component  Value Date   TROPONINI 0.05 (HH) 05/07/2018   No results found for: DDIMER Does the Patient currently have chest pain? No     R Recommendations: See Admitting Provider Note  Report given to:   Additional Notes:  Per pt, her normal O2 sat is around 93-94%

## 2018-05-07 NOTE — ED Notes (Signed)
Pt assisted back to bed from bedside commode and satting at 94%

## 2018-05-07 NOTE — ED Notes (Signed)
Pt reports that she has to have BM - she is requesting to walk to bathroom but she is unable to maintain O2 sat while sitting in bed - explained to pt that she could use bedside commode BUT that once placed on commode she would cover up with a towel and window would have to be opened and O2 would have to be worn and monitoring equipment kept on to ensure pt safety (because she requested a nurse not to sit in the room with her

## 2018-05-07 NOTE — Care Management (Signed)
Open with [facility] for RN, PT, OT, aide.  [place] with [facility] notified.

## 2018-05-07 NOTE — Consult Note (Signed)
Cardiology Consultation:   Patient ID: BENDETTA SOLTANI MRN: [id number]; DOB: 19-Aug-1945  Admit date: 05/07/2018 Date of Consult: 05/07/2018  Primary Care Provider: Corky Downs, MD Primary Cardiologist: Julien Nordmann, MD Primary Electrophysiologist:  None    Patient Profile:   Kristina Archer is a 73 y.o. female with a hx of HFrEF (EF 20-25%, 04/2018) with prior refusal of ischemic evaluation, PAF (10/2017) on Eliquis, SVT, LBBB, anemia, recurrent ureteral stone requiring prior cystoscopy and ureteral stenting, previous smoker, DM 2, hypertension, hyperlipidemia, and is seen today for the evaluation of acute on chronic HFrEF at the request of Dr. Luberta Mutter.  History of Present Illness:   Ms. Coggeshall is a 73 year old female with a history of chronic systolic heart failure and atrial fibrillation as above.  In February 2020, she presented to [facility] with recurrent kidney stone and underwent ureteral stent placement.  Following administration of IVF in the setting of low EF, she developed volume overload and hypoxia.  She was diuresed with initial improvement of her symptoms; however, she developed AKI. With stable Cr, she was transitioned to oral lasix 20mg  daily. Further escalation of evidence based heart failure therapy therapy including spironolactone and Entresto was limited due to her renal function and hypotension. At discharge, she was restarted on low-dose lisinopril 5 mg.  She continued amiodarone 200 mg daily and apixaban 5 mg twice daily given her history of paroxysmal atrial fibrillation, as well as low dose Coreg 3.125mg  BID.   Since that time, patient reported that she did not notice any shortness of breath.  Patient reported stable two-pillow orthopnea.  She denied any new symptoms of early satiety.  She reportedly ambulates with a walker at her assisted living facility and did not notice any increased shortness of breath/DOE.  She denied current home oxygen. She did note that she does  drink a lot of water but denied using additional salt unless her son brought her food, which was rare.   On 04/22/2018, patient was seen at [facility] for acute hypoxic respiratory failure secondary to community-acquired pneumonia.  COVID testing was negative.  She continued to be in NSR. She was started on antibiotics and IV Lasix by internal medicine with improvement and discharged with no changes in her cardiac medication regimen.    On 05/05/2018, the patient reportedly awoke in the middle of the night and felt significantly short of breath for the first time since her 04/22/2018 admission.  She contacted her neighbor, who reportedly is a Engineer, civil (consulting) and recommended she use an inhaler.  She stated this initially alleviated her symptoms; however, she decided to present to [facility] ED when she again awoke this morning (4/27) with shortness of breath.  She denied any associated chest pain, palpitations, or feeling of racing heart rate.  She noted worsening fatigue with her PND and SOB but denied any other new heart failure symptoms. Per EMS, the patient's oxygen saturation was in the high 80s on room air.  ED, vitals were significant for elevated blood pressure at 175/84.  She was 85% on room air and started on nasal cannula with O2 saturations labile since that time and ranging from 88% to 96%.  EKG showed NSR with known left bundle branch block.  Labs showed hypokalemia with potassium 3.4  3.2 and repletion initiated with goal 4.0.  Renal function stable showed stable renal function (baseline of 0.8)  0.79  0.95 and BUN 18  11.  BNP significantly elevated at 1457.0 troponin minimally elevated at 0.05.  Chest  x-ray showed cardiomegaly and mild pulmonary vascular congestion, as well as bilateral pleural effusion and airspace disease.  Lactic acid 1.0 without elevated WBC counts.  Hemoglobin 11.0 and at baseline. Repeat Covid19 testing negative. Echo performed with results as below.   Past Medical History:  Diagnosis Date  .  Anxiety   . Cardiomyopathy (HCC)    a. 10/2017 Echo: EF 20-25%, diff HK, mild MR. Nl RV size.  . DDD (degenerative disc disease), lumbar   . Diverticulosis   . Elevated troponin   . Essential hypertension   . GERD (gastroesophageal reflux disease)   . History of kidney stones   . Hyperlipidemia   . LBBB (left bundle branch block)   . PAF (paroxysmal atrial fibrillation) (HCC)    a.  Diagnosed 10/2017; b.  On Eliquis and amiodarone; c. CHADS2VASc => 5 (CHF, HTN, age x 1, DM, female)  . Pneumonia   . Sciatica   . Type II diabetes mellitus (HCC)     Past Surgical History:  Procedure Laterality Date  . ABDOMINAL HYSTERECTOMY    . CHOLECYSTECTOMY    . CYSTOSCOPY W/ RETROGRADES Bilateral 05/24/2017   Procedure: CYSTOSCOPY WITH RETROGRADE PYELOGRAM;  Surgeon: Vanna Scotland, MD;  Location: [facility] ORS;  Service: Urology;  Laterality: Bilateral;  . CYSTOSCOPY W/ RETROGRADES Right 01/29/2018   Procedure: CYSTOSCOPY WITH RETROGRADE PYELOGRAM;  Surgeon: Vanna Scotland, MD;  Location: [facility] ORS;  Service: Urology;  Laterality: Right;  . CYSTOSCOPY W/ RETROGRADES Right 03/02/2018   Procedure: CYSTOSCOPY WITH RETROGRADE PYELOGRAM;  Surgeon: Sondra Come, MD;  Location: [facility] ORS;  Service: Urology;  Laterality: Right;  . CYSTOSCOPY W/ RETROGRADES Right 03/30/2018   Procedure: CYSTOSCOPY WITH RETROGRADE PYELOGRAM;  Surgeon: Sondra Come, MD;  Location: [facility] ORS;  Service: Urology;  Laterality: Right;  . CYSTOSCOPY W/ URETERAL STENT PLACEMENT Right 11/02/2017   Procedure: CYSTOSCOPY WITH RETROGRADE PYELOGRAM/URETERAL STENT PLACEMENT;  Surgeon: Crista Elliot, MD;  Location: [facility] OR[facility];  Service: Urology;  Laterality: Right;  . CYSTOSCOPY WITH STENT PLACEMENT Right 03/02/2018   Procedure: CYSTOSCOPY WITH STENT PLACEMENT-RIGHT;  Surgeon: Sondra Come, MD;  Location: [facility] OR[facility];  Service: Urology;  Laterality: Right;  . CYSTOSCOPY/URETEROSCOPY/HOLMIUM LASER/STENT PLACEMENT Left 05/24/2017    Procedure: CYSTOSCOPY/URETEROSCOPY/HOLMIUM LASER/STENT PLACEMENT;  Surgeon: Vanna Scotland, MD;  Location: [facility];  Service: Urology;  Laterality: Left;  . CYSTOSCOPY/URETEROSCOPY/HOLMIUM LASER/STENT PLACEMENT Right 01/29/2018   Procedure: CYSTOSCOPY/URETEROSCOPY/HOLMIUM LASER/STENT Exchange;  Surgeon: Vanna Scotland, MD;  Location: [facility];  Service: Urology;  Laterality: Right;  . CYSTOSCOPY/URETEROSCOPY/HOLMIUM LASER/STENT PLACEMENT Right 03/30/2018   Procedure: CYSTOSCOPY/URETEROSCOPY/HOLMIUM LASER/STENT EXCHANGE;  Surgeon: Sondra Come, MD;  Location: [facility] OR[facility];  Service: Urology;  Laterality: Right;     Home Medications:  Prior to Admission medications   Medication Sig Start Date End Date Taking? Authorizing Provider  amiodarone (PACERONE) 200 MG tablet Take 1 tablet (200 mg total) by mouth daily. 01/09/18  Yes Dunn, Raymon Mutton, PA-C  apixaban (ELIQUIS) 5 MG TABS tablet Take 1 tablet (5 mg total) by mouth 2 (two) times daily. Patient taking differently: Take 5 mg by mouth daily.  11/13/17  Yes Katha Hamming, MD  carvedilol (COREG) 3.125 MG tablet Take 1 tablet (3.125 mg total) by mouth 2 (two) times daily with a meal. 11/13/17  Yes Katha Hamming, MD  furosemide (LASIX) 20 MG tablet Take 20 mg by mouth daily.   Yes [provider]  lisinopril (PRINIVIL,ZESTRIL) 5 MG tablet TAKE ONE TABLET BY MOUTH EVERY DAY Patient taking differently: Take  5 mg by mouth daily.  04/02/18  Yes Dunn, Raymon Mutton, PA-C  PARoxetine (PAXIL) 20 MG tablet Take 20 mg by mouth at bedtime.   Yes [provider]  potassium chloride (K-DUR) 10 MEQ tablet Take 1 tablet (10 mEq total) by mouth daily. 04/25/18  Yes Altamese Dilling, MD  rosuvastatin (CRESTOR) 40 MG tablet Take 1 tablet (40 mg total) by mouth daily. 11/13/17  Yes Katha Hamming, MD  sitaGLIPtin (JANUVIA) 100 MG tablet Take 100 mg by mouth daily.   Yes [provider]  traZODone (DESYREL) 50 MG tablet Take 50 mg  by mouth at bedtime.  11/30/17  Yes [provider]    Inpatient Medications: Scheduled Meds: . amiodarone  200 mg Oral Daily  . apixaban  5 mg Oral Daily  . carvedilol  3.125 mg Oral BID WC  . docusate sodium  100 mg Oral BID  . furosemide  40 mg Intravenous Q12H  . linagliptin  5 mg Oral Daily  . [START ON 05/08/2018] losartan  25 mg Oral Daily  . PARoxetine  20 mg Oral QHS  . potassium chloride  30 mEq Oral BID   Followed by  . [START ON 05/08/2018] potassium chloride  10 mEq Oral BID  . rosuvastatin  40 mg Oral Daily  . [START ON 05/09/2018] sacubitril-valsartan  1 tablet Oral BID  . spironolactone  25 mg Oral Daily  . traZODone  50 mg Oral QHS   Continuous Infusions:  PRN Meds: acetaminophen **OR** acetaminophen, bisacodyl, ondansetron **OR** ondansetron (ZOFRAN) IV, traZODone  Allergies:    Allergies  Allergen Reactions  . Codeine Nausea Only    Social History:   Social History   Socioeconomic History  . Marital status: Widowed    Spouse name: Not on file  . Number of children: Not on file  . Years of education: Not on file  . Highest education level: Not on file  Occupational History  . Not on file  Social Needs  . Financial resource strain: Patient refused  . Food insecurity:    Worry: Patient refused    Inability: Patient refused  . Transportation needs:    Medical: Patient refused    Non-medical: Patient refused  Tobacco Use  . Smoking status: Former Smoker    Packs/day: 1.50    Years: 15.00    Pack years: 22.50    Types: Cigarettes    Last attempt to quit: 05/12/1997    Years since quitting: 21.0  . Smokeless tobacco: Never Used  Substance and Sexual Activity  . Alcohol use: Not Currently  . Drug use: Never  . Sexual activity: Not Currently  Lifestyle  . Physical activity:    Days per week: Patient refused    Minutes per session: Patient refused  . Stress: Patient refused  Relationships  . Social connections:    Talks on phone:  Patient refused    Gets together: Patient refused    Attends religious service: Patient refused    Active member of club or organization: Patient refused    Attends meetings of clubs or organizations: Patient refused    Relationship status: Patient refused  . Intimate partner violence:    Fear of current or ex partner: Patient refused    Emotionally abused: Patient refused    Physically abused: Patient refused    Forced sexual activity: Patient refused  Other Topics Concern  . Not on file  Social History Narrative  . Not on file    Family History:  Family History  Problem Relation Age of Onset  . Heart attack Father 64  . Bladder Cancer Neg Hx   . Kidney cancer Neg Hx      ROS:  Please see the history of present illness.    Review of Systems  Constitutional: Positive for malaise/fatigue. Negative for chills and fever.  Respiratory: Positive for shortness of breath. Negative for cough, hemoptysis and sputum production.   Cardiovascular: Positive for orthopnea and PND. Negative for chest pain, palpitations and leg swelling.       Stable 2 pillow orthopnea  Gastrointestinal: Negative for abdominal pain, blood in stool, melena, nausea and vomiting.  Genitourinary: Positive for flank pain.       Chronic/recurrent renal stone  Musculoskeletal: Positive for back pain. Negative for falls.       Chronic back pain, worsened by hospital bed  Neurological: Negative for dizziness, speech change, focal weakness and loss of consciousness.    All other ROS reviewed and negative.     Physical Exam/Data:   Vitals:   05/07/18 1043 05/07/18 1148 05/07/18 1442 05/07/18 1611  BP: (!) 171/94 (!) 159/71    Pulse: 69 63    Temp:  98.2 F (36.8 C)    TempSrc:  Oral    SpO2: 94% 96%  94%  Weight:   74.4 kg   Height:        Intake/Output Summary (Last 24 hours) at 05/07/2018 1616 Last data filed at 05/07/2018 1507 Gross per 24 hour  Intake -  Output 100 ml  Net -100 ml   Filed  Weights   05/07/18 0749 05/07/18 1442  Weight: 73.9 kg 74.4 kg   Body mass index is 30 kg/m.  General:  Well nourished, well developed, in no acute distress HEENT: normal Neck: JVD with JVP ~9cm Vascular: Radial pulses 2+ bilaterally   Cardiac:  Distant heart sounds, normal S1, S2; RRR; no murmur  Lungs: Reduced breath sounds bilaterally with bilateral crackles at the bases, no wheezing, rhonchi or rales  Abd: soft, nontender, no hepatomegaly  Ext: Trace bilateral lower extremity edema, wearing SCDs, wearing compression stockings Musculoskeletal:  No deformities Skin: warm and dry  Neuro:  no focal abnormalities noted Psych:  Normal affect   EKG: NSR, known left bundle branch block Telemetry:  Telemetry was personally reviewed and demonstrates: NSR with rates in the 60sbpm  CV Studies:   Relevant CV Studies: TTE 05/07/2018  1. The left ventricle has severely reduced systolic function, with an ejection fraction of 20-25%. The cavity size was moderately dilated. Left ventricular diastolic Doppler parameters are consistent with impaired relaxation.  2. The right ventricle has normal systolic function. The cavity was normal. There is no increase in right ventricular wall thickness. Right ventricular systolic pressure could not be assessed.  3. Left atrial size was mildly dilated.  4. The mitral valve is grossly normal.  5. The tricuspid valve is grossly normal.  6. The aortic valve is grossly normal. Mild thickening of the aortic valve. Mild calcification of the aortic valve. Aortic valve regurgitation was not assessed by color flow Doppler.  TTE 01/09/2018 - Limited study for evaluation of left ventricular systolic   function. - Left ventricle: Systolic function was severely reduced. The   estimated ejection fraction was in the range of 20% to 25%.   Diffuse hypokinesis. Regional wall motion abnormalities cannot be   excluded. Doppler parameters are consistent with abnormal left    ventricular relaxation (grade 1 diastolic dysfunction). Doppler  parameters are consistent with high ventricular filling pressure. - Mitral valve: Calcified annulus. There was mild to moderate   regurgitation. - Pericardium, extracardiac: A trivial pericardial effusion was   identified.   Laboratory Data:  Chemistry Recent Labs  Lab 05/07/18 0803  NA 136  K 3.2*  CL 104  CO2 24  GLUCOSE 191*  BUN 11  CREATININE 0.95  CALCIUM 9.7  GFRNONAA >60  GFRAA >60  ANIONGAP 8    No results for input(s): PROT, ALBUMIN, AST, ALT, ALKPHOS, BILITOT in the last 168 hours. Hematology Recent Labs  Lab 05/07/18 0803  WBC 9.2  RBC 4.03  HGB 11.0*  HCT 33.0*  MCV 81.9  MCH 27.3  MCHC 33.3  RDW 16.9*  PLT 115*   Cardiac Enzymes Recent Labs  Lab 05/07/18 0803  TROPONINI 0.05*   No results for input(s): TROPIPOC in the last 168 hours.  BNP Recent Labs  Lab 05/07/18 0749  BNP 1,457.0*    DDimer No results for input(s): DDIMER in the last 168 hours.  Radiology/Studies:  Dg Chest Portable 1 View  Result Date: 05/07/2018 CLINICAL DATA:  Increased shortness of breath.  History of diabetes. EXAM: PORTABLE CHEST 1 VIEW COMPARISON:  One-view chest x-ray 04/23/2018 FINDINGS: Heart is enlarged. Patient has been extubated. Diffuse interstitial pattern remains. Bilateral effusions are noted. Bibasilar airspace disease is worse left than right. IMPRESSION: 1. Interval extubation. 2. Cardiomegaly and mild pulmonary vascular congestion. This likely represents some element of congestive heart failure. 3. Bilateral pleural effusions and airspace disease, likely atelectasis. Electronically Signed   By: Marin Robertshristopher  Mattern M.D.   On: 05/07/2018 08:52    Assessment and Plan:   Acute on chronic systolic heart failure - Patient significantly short of breath.  Chest x-ray shows mild pulmonary vascular congestion.  Bilateral crackles heard on exam. Previous admission for pna as above. BNP  significantly elevated as above. -Continue gentle IV diuresis with close monitoring of renal function and electrolytes given AKI previous admission.  Transition back to oral diuresis once patient appears dry. -Continue to monitor I's/O, daily standing weights.  Daily BMET to monitor renal function and electrolytes. - Echo performed and continues to show severely reduced EF as above.  Evidence-based heart failure medication has previously been limited by renal function.  Given renal function is currently stable, [facility] after 36 washout of ACE inhibitor.  Discontinued ACEi. Given elevated BP, will bridge with losartan 25 mg x1 on 4/28.  Will also start on spironolactone 25mg  today given BP, HFrEF, and hypokalemia. Continue to monitor vitals. Oxygen and nebs per IM.  PAF -NSR on telemetry.  Rates continue to be controlled.   -Continue amiodarone 200 mg daily and low-dose Coreg 3.125 as breathing tolerates. - Continue Eliquis 5 mg twice daily with known CHA2DS2VASc score of at least 6 (CHF, hypertension, age, DM, vascular disease, female).  Does not meet reduced dosing criteria.  Hypertension - BP elevated with systolic blood pressure into the 170s.  Started on spironolactone 25mg  today.  Discontinued lisinopril 5 mg as plan to [facility] after 36 hour washout of ACEi with last dose today 4/27. Will bridge with losartan 25 mg for BP control as above and until [facility]. Continue to monitor renal function and electrolytes with daily BMET.  Hypokalemia - Potassium 3.2.  Repleting with goal 4.0.  Will check magnesium with goal 2.0.  Daily BMET. Close monitoring as start on Entresto and Spiro.  Elevated troponin without CP  -  No CP. Previous history of elevated troponin 10/29/2017 and 01/29/2018.  Consider supply demand ischemia with elevated blood pressure and volume overload.  Cannot completely rule out cardiac etiology; however, patient has refused further ischemic  evaluation in the past.  Anemia -Stable and at baseline hemoglobin of approximately 11.0.  Continue to monitor with daily CBC.  HLD - Continue statin   For questions or updates, please contact [facility] Please consult [email or web address] for contact info under     Signed, Lennon Alstrom, PA-C  05/07/2018 4:16 PM

## 2018-05-07 NOTE — ED Notes (Signed)
Pt c/o inability to "catch her breath" - she has desat'd to 88% on 4L - increased to 5L and talked with pt through deep breathing in through nose and out through mouth - pt states that she has anxiety attacks and that she feels like this could be some of the problem

## 2018-05-07 NOTE — Progress Notes (Signed)
*  PRELIMINARY RESULTS* Echocardiogram 2D Echocardiogram has been performed.  Kristina Archer 05/07/2018, 2:02 PM

## 2018-05-07 NOTE — ED Notes (Signed)
Attempted to call report and was told they would call me back 

## 2018-05-07 NOTE — Plan of Care (Signed)

## 2018-05-07 NOTE — ED Notes (Signed)
Dr Marisa Severin notified of O2 sat's and O2 increases

## 2018-05-07 NOTE — Telephone Encounter (Signed)
Patient currently admitted to ED [facility]

## 2018-05-08 DIAGNOSIS — I5023 Acute on chronic systolic (congestive) heart failure: Secondary | ICD-10-CM

## 2018-05-08 DIAGNOSIS — I480 Paroxysmal atrial fibrillation: Secondary | ICD-10-CM

## 2018-05-08 LAB — BASIC METABOLIC PANEL
Anion gap: 8 (ref 5–15)
BUN: 20 mg/dL (ref 8–23)
CO2: 24 mmol/L (ref 22–32)
Calcium: 9.8 mg/dL (ref 8.9–10.3)
Chloride: 104 mmol/L (ref 98–111)
Creatinine, Ser: 1.02 mg/dL — ABNORMAL HIGH (ref 0.44–1.00)
GFR calc Af Amer: >60 mL/min (ref >60)
GFR calc non Af Amer: 56 mL/min — ABNORMAL LOW (ref >60)
Glucose, Bld: 305 mg/dL — ABNORMAL HIGH (ref 70–99)
Potassium: 4.1 mmol/L (ref 3.5–5.1)
Sodium: 136 mmol/L (ref 135–145)

## 2018-05-08 LAB — GLUCOSE, CAPILLARY
Glucose-Capillary: 222 mg/dL — ABNORMAL HIGH (ref 70–99)
Glucose-Capillary: 246 mg/dL — ABNORMAL HIGH (ref 70–99)
Glucose-Capillary: 155 mg/dL — ABNORMAL HIGH (ref 70–99)
Glucose-Capillary: 158 mg/dL — ABNORMAL HIGH (ref 70–99)

## 2018-05-08 LAB — CBC
HCT: 31 % — ABNORMAL LOW (ref 36.0–46.0)
Hemoglobin: 10.3 g/dL — ABNORMAL LOW (ref 12.0–15.0)
MCH: 27 pg (ref 26.0–34.0)
MCHC: 33.2 g/dL (ref 30.0–36.0)
MCV: 81.2 fL (ref 80.0–100.0)
Platelets: 119 10*3/uL — ABNORMAL LOW (ref 150–400)
RBC: 3.82 MIL/uL — ABNORMAL LOW (ref 3.87–5.11)
RDW: 16.8 % — ABNORMAL HIGH (ref 11.5–15.5)
WBC: 7.4 10*3/uL (ref 4.0–10.5)
nRBC: 0 % (ref 0.0–0.2)

## 2018-05-08 MED ORDER — INSULIN ASPART 100 UNIT/ML ~~LOC~~ SOLN
0.0000 [IU] | Freq: Every day | SUBCUTANEOUS | Status: DC
  Administered 2018-05-09: 2 [IU] via SUBCUTANEOUS
  Filled 2018-05-08: qty 1

## 2018-05-08 MED ORDER — ENSURE ENLIVE PO LIQD: 237.0000 mL | Freq: Two times a day (BID) | ORAL | Status: DC

## 2018-05-08 MED ORDER — INSULIN ASPART 100 UNIT/ML ~~LOC~~ SOLN
0.0000 [IU] | Freq: Three times a day (TID) | SUBCUTANEOUS | Status: DC
  Administered 2018-05-08: 3 [IU] via SUBCUTANEOUS
  Administered 2018-05-08: 5 [IU] via SUBCUTANEOUS
  Administered 2018-05-09 (×2): 2 [IU] via SUBCUTANEOUS
  Administered 2018-05-09 – 2018-05-10 (×3): 3 [IU] via SUBCUTANEOUS
  Filled 2018-05-08 (×7): qty 1

## 2018-05-08 MED ORDER — GLUCERNA SHAKE PO LIQD
237.0000 mL | Freq: Three times a day (TID) | ORAL | Status: DC
  Administered 2018-05-08 – 2018-05-09 (×5): 237 mL via ORAL

## 2018-05-08 NOTE — Progress Notes (Addendum)
Inpatient Diabetes Program Recommendations  AACE/ADA: New Consensus Statement on Inpatient Glycemic Control   Target Ranges:  Prepandial:   less than 140 mg/dL      Peak postprandial:   less than 180 mg/dL (1-2 hours)      Critically ill patients:  140 - 180 mg/dL  Results for OLYVIAH, KERSHNER (MRN [id number]) as of 05/08/2018 09:12  Ref. Range 05/08/2018 03:08  Glucose Latest Ref Range: 70 - 99 mg/dL 031 (H)   Results for ROYALLE, FLAD (MRN [id number]) as of 05/07/2018 12:52  Ref. Range 05/07/2018 08:03  Glucose Latest Ref Range: 70 - 99 mg/dL 244 (H)  Results for MATISEN, SARVER (MRN [id number]) as of 05/08/2018 09:12  Ref. Range 02/01/2018 02:12  Hemoglobin A1C Latest Ref Range: 4.8 - 5.6 % 6.5 (H)   Review of Glycemic Control  Diabetes history: DM2 Outpatient Diabetes medications: Januvia 100 mg daily Current orders for Inpatient glycemic control: Tradjenta 5 mg daily  Inpatient Diabetes Program Recommendations:  Insulin-Correction: While inpatient, please consider ordering CBGs with Novolog 0-15 units TID with meals and Novolog 0-5 units QHS.  Thanks, Orlando Penner, RN, MSN, CDE Diabetes Coordinator Inpatient Diabetes Program [phone] (Team Pager from 8am to 5pm)

## 2018-05-08 NOTE — Progress Notes (Signed)
"  Living better with heart failure" packet reviewed with patient. EMMI videos shown, " Heart Failure: What is it?" and "Daily Weights".

## 2018-05-08 NOTE — Progress Notes (Signed)
[facility] - [place] at [facility]   PATIENT NAME: Kristina BurdockBrenda Archer    MR#:  [id number]  DATE OF BIRTH:  [birth year]  SUBJECTIVE: Admitted for CHF exacerbation, had acute respiratory failure on admission, on IV diuretics, feeling little better today.  No chest pain.  Spoke with cardiology Dr. end, continue IV diuretics for 1 more day and see how she does.  CHIEF COMPLAINT:   Chief Complaint  Patient presents with  . Shortness of Breath   Blood sugar up to 300. REVIEW OF SYSTEMS:   ROS CONSTITUTIONAL: No fever, fatigue or weakness.  EYES: No blurred or double vision.  EARS, NOSE, AND THROAT: No tinnitus or ear pain.  RESPIRATORY: shortness of breath is better today. CARDIOVASCULAR: No chest pain, orthopnea, edema.  GASTROINTESTINAL: No nausea, vomiting, diarrhea or abdominal pain.  GENITOURINARY: No dysuria, hematuria.  ENDOCRINE: No polyuria, nocturia,  HEMATOLOGY: No anemia, easy bruising or bleeding SKIN: No rash or lesion. MUSCULOSKELETAL: No joint pain or arthritis.   NEUROLOGIC: No tingling, numbness, weakness.  PSYCHIATRY: No anxiety or depression.   DRUG ALLERGIES:   Allergies  Allergen Reactions  . Codeine Nausea Only    VITALS:  Blood pressure 129/82, pulse 60, temperature (!) 97.5 F (36.4 C), temperature source Oral, resp. rate 19, height 5\' 2"  (1.575 m), weight 74 kg, SpO2 96 %.  PHYSICAL EXAMINATION:  GENERAL:  [age] y.o.-year-old patient lying in the bed with no acute distress.  EYES: Pupils equal, round, reactive to light. No scleral icterus. Extraocular muscles intact.  HEENT: Head atraumatic, normocephalic. Oropharynx and nasopharynx clear.  NECK:  Supple, no jugular venous distention. No thyroid enlargement, no tenderness.  LUNGS: Diminished breath sounds bilaterally.  Decreased rales. CARDIOVASCULAR: S1, S2 normal. No murmurs, rubs, or gallops.  ABDOMEN: Soft, nontender, nondistended. Bowel sounds present. No organomegaly or mass.   EXTREMITIES: No pedal edema, cyanosis, or clubbing.  NEUROLOGIC: Cranial nerves II through XII are intact. Muscle strength 5/5 in all extremities. Sensation intact. Gait not checked.  PSYCHIATRIC: The patient is alert and oriented x 3.  SKIN: No obvious rash, lesion, or ulcer.    LABORATORY PANEL:   CBC Recent Labs  Lab 05/08/18 0308  WBC 7.4  HGB 10.3*  HCT 31.0*  PLT 119*   ------------------------------------------------------------------------------------------------------------------  Chemistries  Recent Labs  Lab 05/07/18 0803 05/08/18 0308  NA 136 136  K 3.2* 4.1  CL 104 104  CO2 24 24  GLUCOSE 191* 305*  BUN 11 20  CREATININE 0.95 1.02*  CALCIUM 9.7 9.8  MG 1.7  --    ------------------------------------------------------------------------------------------------------------------  Cardiac Enzymes Recent Labs  Lab 05/07/18 0803  TROPONINI 0.05*   ------------------------------------------------------------------------------------------------------------------  RADIOLOGY:  Dg Chest Portable 1 View  Result Date: 05/07/2018 CLINICAL DATA:  Increased shortness of breath.  History of diabetes. EXAM: PORTABLE CHEST 1 VIEW COMPARISON:  One-view chest x-ray 04/23/2018 FINDINGS: Heart is enlarged. Patient has been extubated. Diffuse interstitial pattern remains. Bilateral effusions are noted. Bibasilar airspace disease is worse left than right. IMPRESSION: 1. Interval extubation. 2. Cardiomegaly and mild pulmonary vascular congestion. This likely represents some element of congestive heart failure. 3. Bilateral pleural effusions and airspace disease, likely atelectasis. Electronically Signed   By: Marin Robertshristopher  Mattern M.D.   On: 05/07/2018 08:52    EKG:   Orders placed or performed during the hospital encounter of 05/07/18  . ED EKG  . ED EKG    ASSESSMENT AND PLAN:   Acute respiratory failure secondary to's acute on chronic  systolic heart failure, on IV  diuretics, oxygen, she is clinically improving.  Continue oxygen and wean off as tolerated.  Also evaluate for oxygen need for discharge.   2,  Acute on chronic systolic heart failure,, patient BNP is elevated to 1457 on admission.  Patient ejection fraction 20 to 25%, spoke with cardiology Dr. Cristal Deer End continue IV diuretics, check daily weights, I&O's, monitor kidney function closely.  Continue Coreg.,  Losartan, Sherryll Burger will be started tomorrow.  Also on Aldactone 25 mg daily.   3. hypokalemia: Improved.  Proximal atrial fibrillation, patient is on amiodarone, Eliquis. 4 diabetes mellitus type 2, continue Tradjenta, sliding scale insulin with coverage.  Asked  the patient if I can talk to her son, patient does not want me to call her son [name]. All the records are reviewed and case discussed with Care Management/Social Workerr. Management plans discussed with the patient, family and they are in agreement.  CODE STATUS: Full code  TOTAL TIME TAKING CARE OF THIS PATIENT: 38 minutes.   POSSIBLE D/C IN 1-2 DAYS, DEPENDING ON CLINICAL CONDITION. More than 50% time spent in counseling, coordination of care.  Katha Hamming M.D on 05/08/2018 at 9:27 AM  Between 7am to 6pm - Pager - [phone]  After 6pm go to [email or web address] - password [email or web address] [facility]  [place] [facility]  Office  [phone]  CC: Primary care physician; Corky Downs, MD   Note: This dictation was prepared with Dragon dictation along with smaller phrase technology. Any transcriptional errors that result from this process are unintentional.

## 2018-05-08 NOTE — Plan of Care (Signed)
Nutrition Education Note  RD consulted for nutrition education regarding CHF.  73 y/o female admitted with CHF exacerbation   Spoke with pt via phone today.   RD provided "Low Sodium Nutrition Therapy" handout from the Academy of [employer] via mail. Reviewed patient's dietary recall. Provided examples on ways to decrease sodium intake in diet. Discouraged intake of processed foods and use of salt shaker. Encouraged fresh fruits and vegetables as well as whole grain sources of carbohydrates to maximize fiber intake.   RD discussed why it is important for patient to adhere to diet recommendations, and emphasized the role of fluids, foods to avoid, and importance of weighing self daily. Teach back method used.  Expect good compliance.  Body mass index is 29.84 kg/m. Pt meets criteria for overweight based on current BMI.  Current diet order is HH patient is consuming approximately 50% of meals at this time.   RD following this patient   Betsey Holiday MS, RD, LDN Pager #- [phone] Office#- [phone] After Hours Pager: [phone]

## 2018-05-08 NOTE — Progress Notes (Addendum)
Initial Nutrition Assessment  DOCUMENTATION CODES:   Not applicable  INTERVENTION:   Glucerna Shake po TID, each supplement provides 220 kcal and 10 grams of protein  2g sodium diet   Low sodium diet education  NUTRITION DIAGNOSIS:   Inadequate oral intake related to acute illness as evidenced by per patient/family report  GOAL:   Patient will meet greater than or equal to 90% of their needs  MONITOR:   PO intake, Supplement acceptance, Weight trends, Labs, Skin, I & O's  REASON FOR ASSESSMENT:   Malnutrition Screening Tool, Consult Diet education  ASSESSMENT:   73 y.o. female with a hx of HFrEF (EF 20-25%, 04/2018) with prior refusal of ischemic evaluation, PAF (10/2017) on Eliquis, SVT, LBBB, anemia, recurrent ureteral stone requiring prior cystoscopy and ureteral stenting, previous smoker, DM 2, hypertension, hyperlipidemia admitted with CHF exacerbation   RD working remotely.  Spoke with pt via phone today. Pt reports decreased appetite and oral intake for several days r/t SOB. Pt reports her appetite is improving in hospital but she is unhappy with the heart healthy diet. Pt reports eating 50% of her meals today. Pt reports that she has Boost at home but doesn't really like it. Per chart, pt with 17lb(10%) wt loss over the past 4 months which is significant. Pt reports her weight has been stable at 163-164lbs since her last admit to the hospital. RD will add strawberry Glucerna and liberalize the heart healthy portion of pt's diet to help her meet her estimated needs as pt is no likely eating enough to exceed nutrient limits. RD provided pt with low sodium diet education today.   Medications reviewed and include: colace, lasix, insulin, aldactone  Labs reviewed: creat 1.02(H) Hgb 10.0(L), Hct 31.0(L) cbgs- 222, 246 x 24 hrs AIC 6.5(H)- 1/23  Unable to complete Nutrition-Focused physical exam at this time.   Diet Order:   Diet Order            Diet Heart Room  service appropriate? Yes; Fluid consistency: Thin  Diet effective now              EDUCATION NEEDS:   Education needs have been addressed  Skin:  Skin Assessment: Reviewed RN Assessment  Last BM:  4/27- type 4  Height:   Ht Readings from Last 1 Encounters:  05/07/18 5\' 2"  (1.575 m)    Weight:   Wt Readings from Last 1 Encounters:  05/08/18 74 kg    Ideal Body Weight:  50 kg  BMI:  Body mass index is 29.84 kg/m.  Estimated Nutritional Needs:   Kcal:  1500-1700kcal/day   Protein:  75-85g/day   Fluid:  1.5L/day   Betsey Holiday MS, RD, LDN Pager #- [phone] Office#- [phone] After Hours Pager: [phone]

## 2018-05-08 NOTE — Progress Notes (Signed)
Progress Note  Patient Name: Kristina MooreBrenda A Archer Date of Encounter: 05/08/2018  Primary Cardiologist: Kristina Nordmannimothy Gollan, MD   Subjective   Patient reported continued improvement in her shortness of breath today with diuresis.   She continues to deny chest pain, palpitations, or feeling of racing heart rate.  Inpatient Medications    Scheduled Meds: . amiodarone  200 mg Oral Daily  . apixaban  5 mg Oral Daily  . carvedilol  3.125 mg Oral BID WC  . docusate sodium  100 mg Oral BID  . furosemide  40 mg Intravenous Q12H  . insulin aspart  0-15 Units Subcutaneous TID WC  . insulin aspart  0-5 Units Subcutaneous QHS  . linagliptin  5 mg Oral Daily  . PARoxetine  20 mg Oral QHS  . potassium chloride  10 mEq Oral BID  . rosuvastatin  40 mg Oral Daily  . [START ON 05/09/2018] sacubitril-valsartan  1 tablet Oral BID  . spironolactone  25 mg Oral Daily  . traZODone  50 mg Oral QHS   Continuous Infusions:  PRN Meds: acetaminophen **OR** acetaminophen, bisacodyl, ondansetron **OR** ondansetron (ZOFRAN) IV, traZODone   Vital Signs    Vitals:   05/07/18 1948 05/08/18 0501 05/08/18 0750 05/08/18 0902  BP: (!) 167/66 (!) 146/60 129/82   Pulse: 64 (!) 57 (!) 53 60  Resp: 20 18 19    Temp: 98 F (36.7 C) 97.6 F (36.4 C) (!) 97.5 F (36.4 C)   TempSrc: Oral Oral Oral   SpO2: 97% 94% 96%   Weight:  74 kg    Height:        Intake/Output Summary (Last 24 hours) at 05/08/2018 1044 Last data filed at 05/08/2018 0918 Gross per 24 hour  Intake 420 ml  Output 1350 ml  Net -930 ml   Filed Weights   05/07/18 0749 05/07/18 1442 05/08/18 0501  Weight: 73.9 kg 74.4 kg 74 kg    Telemetry    SR - Personally Reviewed  ECG    SR, known LBBB - Personally Reviewed  Physical Exam   GEN: No acute distress.   Neck: mild JVD Cardiac: RRR, no murmurs, rubs, or gallops.  Respiratory: Bibasilar crackles. GI: Soft, nontender, non-distended  MS: Trace lower extremity edema; No deformity.  Compression stockings and SCDs Neuro:  Nonfocal  Psych: Normal affect   Labs    Chemistry Recent Labs  Lab 05/07/18 0803 05/08/18 0308  NA 136 136  K 3.2* 4.1  CL 104 104  CO2 24 24  GLUCOSE 191* 305*  BUN 11 20  CREATININE 0.95 1.02*  CALCIUM 9.7 9.8  GFRNONAA >60 56*  GFRAA >60 >60  ANIONGAP 8 8     Hematology Recent Labs  Lab 05/07/18 0803 05/08/18 0308  WBC 9.2 7.4  RBC 4.03 3.82*  HGB 11.0* 10.3*  HCT 33.0* 31.0*  MCV 81.9 81.2  MCH 27.3 27.0  MCHC 33.3 33.2  RDW 16.9* 16.8*  PLT 115* 119*    Cardiac Enzymes Recent Labs  Lab 05/07/18 0803  TROPONINI 0.05*   No results for input(s): TROPIPOC in the last 168 hours.   BNP Recent Labs  Lab 05/07/18 0749  BNP 1,457.0*     DDimer No results for input(s): DDIMER in the last 168 hours.   Radiology    Dg Chest Portable 1 View  Result Date: 05/07/2018 CLINICAL DATA:  Increased shortness of breath.  History of diabetes. EXAM: PORTABLE CHEST 1 VIEW COMPARISON:  One-view chest x-ray 04/23/2018 FINDINGS:  Heart is enlarged. Patient has been extubated. Diffuse interstitial pattern remains. Bilateral effusions are noted. Bibasilar airspace disease is worse left than right. IMPRESSION: 1. Interval extubation. 2. Cardiomegaly and mild pulmonary vascular congestion. This likely represents some element of congestive heart failure. 3. Bilateral pleural effusions and airspace disease, likely atelectasis. Electronically Signed   By: Kristina Archer M.D.   On: 05/07/2018 08:52    Cardiac Studies   TTE 05/07/2018 1. The left ventricle has severely reduced systolic function, with an ejection fraction of 20-25%. The cavity size was moderately dilated. Left ventricular diastolic Doppler parameters are consistent with impaired relaxation. 2. The right ventricle has normal systolic function. The cavity was normal. There is no increase in right ventricular wall thickness. Right ventricular systolic pressure could not be  assessed. 3. Left atrial size was mildly dilated. 4. The mitral valve is grossly normal. 5. The tricuspid valve is grossly normal. 6. The aortic valve is grossly normal. Mild thickening of the aortic valve. Mild calcification of the aortic valve. Aortic valve regurgitation was not assessed by color flow Doppler.   Patient Profile     73 y.o. female with a hx of HFrEF (EF 20-25%, 04/2018) with prior refusal of ischemic evaluation, PAF (10/2017) on Eliquis, SVT, LBBB, anemia, recurrent ureteral stone requiring prior cystoscopy and ureteral stenting, previous smoker, DM 2, hypertension, hyperlipidemia, and is seen today for the evaluation of acute on chronic HFrEF at the request of Dr. Luberta Archer.  Assessment & Plan    Acute on chronic systolic heart failure - Improvement in shortness of breath but does not yet feel she is back to baseline. I/O not well documented since yesterday but weight down since 4/27. - Continue IV diuresis with lasix  BID. Slight bump in renal function overnight 0.95  1.02; however, still volume overloaded on exam. Continue to closely monitor renal function with daily BMET.   - Switching ACEi to [facility] with losartan bridge as below given elevated BP at admission and per guideline directed therapy as EF 20-25% (echo above).  Plan to start [facility] tomorrow 4/29.  - Spironolactone also added per guideline directed therapy given hypokalemia and for additional BP support.  - Will likely need discharged home on increased oral diuretic with lasix  daily. - Will need close outpatient follow-up and labs to monitor renal function, electrolytes, BP.    PAF -Remains in sinus rhythm with known left bundle branch block - Continue amiodarone  daily, apixaban  BID, Coreg 3.125 with hold parameters. - Continue to monitor on telemetry  Hypertension -Elevated blood pressure at admission.  Improved today with SBP 146-129. Due to low HR, was not able to increase  beta-blocker. Discontinued PTA ACEi and allowing for 36h washout before starting Entresto with losartan bridge. Entresto starts tomorrow 4/29.  Spironolactone added for additional BP support. - Continue to monitor  Hypokalemia, resolved - Repleted yesterday with goal 4.0. 4/28 K 4.1.  - Mg 1.7 with goal 2.0. - Started on spironolactone.  - PTA potassium repletion of daily, which was increased to BID given double po lasix dose for IV diuresis. Continue to monitor potassium with daily BMET and titrate supplementation as needed with diuresis.  Elevated troponin without CP - Continues to refuse further ischemic evaluation with cardiac catheterization. Troponin likely elevated due to supply demand ischemia with elevated BP and volume overload at admission. However, cannot completely rule out cardiac etiology given risk factors and would benefit from future ischemic workup.  Known LBBB - In the  future, patient may benefit from CRT to improve EF, following ischemic cardiac evaluation and failure of medical therapy. Patient continues to defer ischemic evaluation as above.  HLD - Continue statin    For questions or updates, please contact [facility] Please consult [email or web address] for contact info under        Signed, Lennon Alstrom, PA-C  05/08/2018, 10:44 AM

## 2018-05-08 NOTE — Progress Notes (Signed)
Cardiovascular and Pulmonary Nurse Navigator Note:   72 year old female with known history of systolic heart failure, CM, DDD, essential HTN, GERD, HLD, PAF, DM, who presented to the ER with worsening SOB and cough for few days prior to presentation.  Patient with elevated BNP of 1457.   Patient admitted with dx of acute respiratory failure with hypoxia secondary to CHF exacerbation.  Patient requiring oxygen upon presentation whereas patient was not on home oxygen prior to admission.  COVID-19 test has been negative.  Patient had echocardiogram done in December 2019 which revealed EF of 20 to 25%.  Repeat echo performed yesterday EF was 20-25%.  Patient with Paroxysmal Afib; Controlled with continue amiodarone, apixaban.  CHF Education:   Rounded on patient.  Patient lying in bed with HOB elevated with supplemental oxygen in use.  Educational session with patient completed.  Provided patient with "Living Better with Heart Failure" packet. Briefly reviewed definition of heart failure and signs and symptoms of an exacerbation.?Patient stated she did not notice any abdominal swelling, but she does have swelling of ankles and feet currently.  Explained to patient that HF is a chronic illness which requires self-assessment / self-management along with help from the cardiologist/PCP.?Reviewed what EF means and patient's value on echo yesterday and  December 2019 compared to normal EF value.  Patient informed this RN that the doctors and her son would like for her to have an ischemic workup / cardiac catheterization, but patient has refused. Patient's son would like for her to have this procedure.  Patient stated, "I do not want to have that procedure during the COVID-19 crisis.  (Yet, patient is in the hospital with CHF at this time and was hospitalized 3 weeks ago (April 12 - 15, 2020) with respiratory failure.    *Reviewed importance of and reason behind checking weight daily in the AM, after using the bathroom,  but before getting dressed. Patient has functioning scale.  Patient admits she has not been weighing herself.  This RN stressed the importance of weighing daily to track patient's fluid status.  ?  *Reviewed with patient the following information: *Discussed when to call the Dr= weight gain of >2-3lb overnight of 5lb in a week,  *Discussed yellow zone= call MD: weight gain of >2-3lb overnight of 5lb in a week, increased swelling, increased SOB when lying down, chest discomfort, dizziness, increased fatigue *Red Zone= call 911: struggle to breath, fainting or near fainting, significant chest pain  Zone Magnet given and reviewed with patient.    *Diet - Dietitian consultation ordered for low sodium carbohydrate modified heart healthy diet. Reviewed low sodium food options with patient. Discussed reading food labels.  Discussed recommended foods vs non-recommended foods.  Stressed the recommended sodium in the diet per day is less than 2000 mg of sodium.  Handouts given.    *Discussed fluid intake with patient.  Upon asking patient how many fluids she drinks per day, she replied, "I have no idea."   Patient not currently on a fluid restriction, but advised no more than 64 ounces of fluid per day.?Demonstrated this amount / volume using the bedside water pitcher.  Explained to patient if she is drinking more every day than she is voiding then she will have more fluid onboard every day which will accumulate.    ? *Instructed patient to take medications as prescribed for heart failure. Explained briefly why pt is on the medications (either make you feel better, live longer or keep you out  of the hospital) and discussed monitoring and side effects. Patient stated she did not think the dose of oral lasix and metolazone she was taking at home was working.   Patient informed this RN the cardiologist has plans to increase her home dose of Lasix upon discharge.     *Discussed exercise.  Prior to admission patient  was being [facility].  Patient ambulates with walker, still drives and had not previously needed home oxygen therapy.  Discussed with patient after completing in-home PT patient would be a candidate for Cardiac Rehab with a dx of CHF with EF of 20 -25%.  Explained to patient the Cardiac Rehab dept is temporarily closed due to the COVID-19 pandemic.    Overview of program provided.  Brochure given along with information on classes, CPT billing codes, orientation times, etc.   Encouraged patient to remain as active as possible.  *Smoking Cessation- Patient is a FORMER smoker.? ? *[facility] -  [facility] - First visit was a virtual visit on 05/02/2018.  Patient's next appointment in the HF Clinic (*Virtual) is 05/16/2018 at 9:20 a.m. ? Will follow-up with patient tomorrow to answer questions and provide more education.    Patient thanked me for providing the above information. ? ? Army Meliaiane Wright, RN, BSN, [facility]? [facility]  [facility] Cardiac &?Pulmonary Rehab  Cardiovascular &?Pulmonary Nurse Navigator  Direct Line: [phone]  Department Phone #: [phone] Fax: [phone]? Email Address: [email or web address]

## 2018-05-09 ENCOUNTER — Telehealth: Payer: Self-pay

## 2018-05-09 LAB — GLUCOSE, CAPILLARY
Glucose-Capillary: 125 mg/dL — ABNORMAL HIGH (ref 70–99)
Glucose-Capillary: 155 mg/dL — ABNORMAL HIGH (ref 70–99)
Glucose-Capillary: 122 mg/dL — ABNORMAL HIGH (ref 70–99)
Glucose-Capillary: 231 mg/dL — ABNORMAL HIGH (ref 70–99)

## 2018-05-09 LAB — BASIC METABOLIC PANEL
Anion gap: 11 (ref 5–15)
BUN: 25 mg/dL — ABNORMAL HIGH (ref 8–23)
CO2: 29 mmol/L (ref 22–32)
Calcium: 9.9 mg/dL (ref 8.9–10.3)
Chloride: 100 mmol/L (ref 98–111)
Creatinine, Ser: 1.02 mg/dL — ABNORMAL HIGH (ref 0.44–1.00)
GFR calc Af Amer: >60 mL/min (ref >60)
GFR calc non Af Amer: 56 mL/min — ABNORMAL LOW (ref >60)
Glucose, Bld: 125 mg/dL — ABNORMAL HIGH (ref 70–99)
Potassium: 3.7 mmol/L (ref 3.5–5.1)
Sodium: 140 mmol/L (ref 135–145)

## 2018-05-09 MED ORDER — FUROSEMIDE 40 MG PO TABS: 40.0000 mg | ORAL_TABLET | Freq: Two times a day (BID) | ORAL | Status: DC

## 2018-05-09 MED ORDER — FUROSEMIDE 10 MG/ML IJ SOLN
40.0000 mg | Freq: Once | INTRAMUSCULAR | Status: AC
  Administered 2018-05-09: 40 mg via INTRAVENOUS
  Filled 2018-05-09: qty 4

## 2018-05-09 MED ORDER — POTASSIUM CHLORIDE CRYS ER 20 MEQ PO TBCR
20.0000 meq | EXTENDED_RELEASE_TABLET | Freq: Every day | ORAL | Status: DC
  Administered 2018-05-09 – 2018-05-10 (×2): 20 meq via ORAL
  Filled 2018-05-09 (×2): qty 1

## 2018-05-09 NOTE — Progress Notes (Signed)
Cardiovascular and Pulmonary Nurse Navigator Note:    CHF education continued:   Yesterday patient's diet was changed to 2 gm Sodium with a 1500 mL fluid restriction.  Upon asking patient about new fluid restriction of 1500 mL, patient was vague in her response and stated, "I remember what you said yesterday of 64 ounces or 2 liters per 24 hours."  This RN explained that her diet and fluid restriction was changed after I talked with her yesterday.  Stressed the importance of the new fluid restriction of 1500 mL and demonstrated this amount using the bedside water pitcher.  Patient's response was, "I am a diabetic and I stay thirsty all the time.  It's easier to make changes with food, like no salt, than it is with reducing fluids."  Again, this RN explained we are trying to get more fluid off.    Patient viewed HF videos last evening and stated they were very helpful.    Patient sitting up in chair this morning.  Patient stated, "I needed to get out of that bed."  Patient remains on supplemental oxygen.  Again, this RN discussed continuation of home health PT upon discharge and once patient has completed PT then transition to Cardiac Rehab.  Patient in agreement as long as the COVID-19 pandemic is under control and sheltering in place has been lifted.   Will follow-up with patient again tomorrow.    Army Melia, RN, BSN, [facility]  [place]  [facility] Cardiac & Pulmonary Rehab  Cardiovascular & Pulmonary Nurse Navigator  Direct Line: [phone]  Department Phone #: [phone] Fax: [phone]  Email Address: [email or web address]

## 2018-05-09 NOTE — Telephone Encounter (Signed)
Pt is currently admitted call 05/10/18

## 2018-05-09 NOTE — Progress Notes (Signed)
[facility] [place] at [facility]   PATIENT NAME: Kristina BurdockBrenda Valli    MR#:  [id number]  DATE OF BIRTH:  October 31, 1945    CHIEF COMPLAINT:   Chief Complaint  Patient presents with  . Shortness of Breath   Shortness of breath has improved since admission but still present.  Has orthopnea.  Increased urine output.  On 2 L oxygen.  REVIEW OF SYSTEMS:   ROS CONSTITUTIONAL: Fatigue EYES: No blurred or double vision.  EARS, NOSE, AND THROAT: No tinnitus or ear pain.  RESPIRATORY: shortness of breath  CARDIOVASCULAR: No chest pain, positive orthopnea, edema.  GASTROINTESTINAL: No nausea, vomiting, diarrhea or abdominal pain.  GENITOURINARY: No dysuria, hematuria.  ENDOCRINE: No polyuria, nocturia,  HEMATOLOGY: No anemia, easy bruising or bleeding SKIN: No rash or lesion. MUSCULOSKELETAL: No joint pain or arthritis.   NEUROLOGIC: No tingling, numbness, weakness.  PSYCHIATRY: No anxiety or depression.   DRUG ALLERGIES:   Allergies  Allergen Reactions  . Codeine Nausea Only    VITALS:  Blood pressure (!) 148/66, pulse (!) 55, temperature 98 F (36.7 C), temperature source Oral, resp. rate 19, height 5\' 2"  (1.575 m), weight 74.4 kg, SpO2 97 %.  PHYSICAL EXAMINATION:  GENERAL:  73 y.o.-year-old patient lying in the bed with no acute distress.  EYES: Pupils equal, round, reactive to light. No scleral icterus. Extraocular muscles intact.  HEENT: Head atraumatic, normocephalic. Oropharynx and nasopharynx clear.  NECK:  Supple, no jugular venous distention. No thyroid enlargement, no tenderness.  LUNGS: Diminished breath sounds bilaterally.  Decreased rales. CARDIOVASCULAR: S1, S2 normal. No murmurs, rubs, or gallops.  ABDOMEN: Soft, nontender, nondistended. Bowel sounds present. No organomegaly or mass.  EXTREMITIES: No pedal edema, cyanosis, or clubbing.  NEUROLOGIC: Cranial nerves II through XII are intact. Muscle strength 5/5 in all extremities. Sensation  intact. Gait not checked.  PSYCHIATRIC: The patient is alert and oriented x 3.  SKIN: No obvious rash, lesion, or ulcer.    LABORATORY PANEL:   CBC Recent Labs  Lab 05/08/18 0308  WBC 7.4  HGB 10.3*  HCT 31.0*  PLT 119*   ------------------------------------------------------------------------------------------------------------------  Chemistries  Recent Labs  Lab 05/07/18 0803  05/09/18 0549  NA 136   < > 140  K 3.2*   < > 3.7  CL 104   < > 100  CO2 24   < > 29  GLUCOSE 191*   < > 125*  BUN 11   < > 25*  CREATININE 0.95   < > 1.02*  CALCIUM 9.7   < > 9.9  MG 1.7  --   --    < > = values in this interval not displayed.   ------------------------------------------------------------------------------------------------------------------  Cardiac Enzymes Recent Labs  Lab 05/07/18 0803  TROPONINI 0.05*   ------------------------------------------------------------------------------------------------------------------  RADIOLOGY:  No results found.  EKG:   Orders placed or performed during the hospital encounter of 05/07/18  . ED EKG  . ED EKG  . EKG    ASSESSMENT AND PLAN:   1. Acute respiratory failure secondary to acute on chronic systolic heart failure - IV Lasix, Coreg -Losartan stopped and started on Entresto - Input and Output - Counseled to limit fluids and Salt - Monitor Bun/Cr and Potassium - Echo-reviewed -Cardiology following.  Appreciate input Patient will need further inpatient diuresis as she continues to have be short of breath and is on oxygen.  2. hypokalemia: Replace while being diuresed.  3.  Paroxysmal atrial fibrillation, patient is on amiodarone,  Coreg, Eliquis.  4 diabetes mellitus type 2, continue Tradjenta, sliding scale insulin with coverage.  Discussed with patient at length regarding her CHF, fluid and salt restriction, weight check daily and close follow-up after discharge.  All the records are reviewed and case  discussed with Care Management/Social Workerr. Management plans discussed with the patient, family and they are in agreement.  CODE STATUS: Full code  TOTAL TIME TAKING CARE OF THIS PATIENT: 38 minutes.   Molinda Bailiff Latiesha Harada M.D on 05/09/2018 at 10:54 AM  Between 7am to 6pm - Pager - [phone]  After 6pm go to [email or web address] - password [email or web address] [facility]  [facility]  Office  [phone]  CC: Primary care physician; Corky Downs, MD   Note: This dictation was prepared with Dragon dictation along with smaller phrase technology. Any transcriptional errors that result from this process are unintentional.

## 2018-05-09 NOTE — Progress Notes (Signed)
Progress Note  Patient Name: Kristina Archer Date of Encounter: 05/09/2018  Primary Cardiologist: Mariah MillingGollan  Subjective   SOB improving. No chest pain, palpitations, or lower extremity swelling. Documented UOP of 2.3 L for the past 24 hours with a net -3 L for the admission. Renal function stable.   Inpatient Medications    Scheduled Meds: . amiodarone  200 mg Oral Daily  . apixaban  5 mg Oral Daily  . carvedilol  3.125 mg Oral BID WC  . docusate sodium  100 mg Oral BID  . feeding supplement (GLUCERNA SHAKE)  237 mL Oral TID BM  . furosemide  40 mg Intravenous Once  . furosemide  40 mg Oral BID  . insulin aspart  0-15 Units Subcutaneous TID WC  . insulin aspart  0-5 Units Subcutaneous QHS  . linagliptin  5 mg Oral Daily  . PARoxetine  20 mg Oral QHS  . potassium chloride  20 mEq Oral Daily  . rosuvastatin  40 mg Oral Daily  . sacubitril-valsartan  1 tablet Oral BID  . spironolactone  25 mg Oral Daily  . traZODone  50 mg Oral QHS   Continuous Infusions:  PRN Meds: acetaminophen **OR** acetaminophen, bisacodyl, ondansetron **OR** ondansetron (ZOFRAN) IV, traZODone   Vital Signs    Vitals:   05/08/18 1659 05/08/18 1709 05/08/18 1939 05/09/18 0508  BP: (!) 152/65  (!) 147/62 137/63  Pulse: (!) 50 63 67 (!) 55  Resp: 19  20 18   Temp: 98.3 F (36.8 C)  97.7 F (36.5 C) 98.2 F (36.8 C)  TempSrc: Oral  Oral Oral  SpO2: 95%  95% 95%  Weight:    74.4 kg  Height:        Intake/Output Summary (Last 24 hours) at 05/09/2018 0801 Last data filed at 05/09/2018 0510 Gross per 24 hour  Intake -  Output 2350 ml  Net -2350 ml   Filed Weights   05/07/18 1442 05/08/18 0501 05/09/18 0508  Weight: 74.4 kg 74 kg 74.4 kg    Telemetry    Sinus bradycardia to NSR - Personally Reviewed  ECG    n/a - Personally Reviewed  Physical Exam   GEN: No acute distress.   Neck: Mildly elevated JVD. Cardiac: RRR, no murmurs, rubs, or gallops.  Respiratory: Clear to auscultation  bilaterally.  GI: Soft, nontender, non-distended.   MS: No edema; No deformity. Compression stockings noted.  Neuro:  Alert and oriented x 3; Nonfocal.  Psych: Normal affect.  Labs    Chemistry Recent Labs  Lab 05/07/18 0803 05/08/18 0308 05/09/18 0549  NA 136 136 140  K 3.2* 4.1 3.7  CL 104 104 100  CO2 24 24 29   GLUCOSE 191* 305* 125*  BUN 11 20 25*  CREATININE 0.95 1.02* 1.02*  CALCIUM 9.7 9.8 9.9  GFRNONAA >60 56* 56*  GFRAA >60 >60 >60  ANIONGAP 8 8 11      Hematology Recent Labs  Lab 05/07/18 0803 05/08/18 0308  WBC 9.2 7.4  RBC 4.03 3.82*  HGB 11.0* 10.3*  HCT 33.0* 31.0*  MCV 81.9 81.2  MCH 27.3 27.0  MCHC 33.3 33.2  RDW 16.9* 16.8*  PLT 115* 119*    Cardiac Enzymes Recent Labs  Lab 05/07/18 0803  TROPONINI 0.05*   No results for input(s): TROPIPOC in the last 168 hours.   BNP Recent Labs  Lab 05/07/18 0749  BNP 1,457.0*     DDimer No results for input(s): DDIMER in the last 168  hours.   Radiology    Dg Chest Portable 1 View  Result Date: 05/07/2018 IMPRESSION: 1. Interval extubation. 2. Cardiomegaly and mild pulmonary vascular congestion. This likely represents some element of congestive heart failure. 3. Bilateral pleural effusions and airspace disease, likely atelectasis. Electronically Signed   By: Marin Roberts M.D.   On: 05/07/2018 08:52    Cardiac Studies   2D Echo 05/07/2018 1. The left ventricle has severely reduced systolic function, with an ejection fraction of 20-25%. The cavity size was moderately dilated. Left ventricular diastolic Doppler parameters are consistent with impaired relaxation. 2. The right ventricle has normal systolic function. The cavity was normal. There is no increase in right ventricular wall thickness. Right ventricular systolic pressure could not be assessed. 3. Left atrial size was mildly dilated. 4. The mitral valve is grossly normal. 5. The tricuspid valve is grossly normal. 6. The  aortic valve is grossly normal. Mild thickening of the aortic valve. Mild calcification of the aortic valve. Aortic valve regurgitation was not assessed by color flow Doppler.  Patient Profile     73 y.o. female with history of HFrEF with prior refusal of ischemic evaluation, PAF (10/2017) on Eliquis, SVT, LBBB, anemia, recurrent ureteral stone requiring prior cystoscopy and ureteral stenting,previous smoker,DM2, hypertension, and hyperlipidemia who is seen today for the evaluation of acute on chronic HFrEF at the request of Dr. Luberta Mutter.  Assessment & Plan    1. Acute on chronic HFrEF with known LBBB: -Volume status continues to improve -Give another dose of IV Lasix this morning followed by transition to PO Lasix 40 mg bid starting this afternoon -Initially, there was some concern her cardiomyopathy was tachy-mediated in the setting of her Afib. However, following restoration of sinus rhythm she did not have interval improvement in her EF. Cardiac cath has been recommended several times with the patient continuing to refuse -Continue Entresto, Coreg, Lasix, and spironolactone -In follow up, we will continue to discuss potential cath and consideration for possible CRT-D -CHF education   2. PAF: -Maintaining sinus rhythm -Continue amiodarone and Coreg -Continue Eliquis given a CHADS2VASc of at least 5 (CHF, HTN, age x 1, vascular disease, female) -Recent LFT and TSH normal  3. Elevated troponin: -No chest pain -Minimally elevated with a peak of 0.08 likely secondary to demand ischemia in the setting of volume overload -She has continued to refuse ischemic evaluation -She will need to follow up as an outpatient for further discussion  -Eliquis in place of ASA -Aggressive risk factor modification   4. HTN: -Reasonably controlled -Continue Entresto, Coreg, Lasix, and spironolactone   5. Hypokalemia: -Improved -Continue spironolactone, Entresto, and low dose KCl -She will need a  BMET in 1 week to assess renal function and potassium   6. HLD: -Crestor  For questions or updates, please contact [facility] Please consult [email or web address] for contact info under Cardiology/STEMI.    Signed, Eula Listen, PA-C [facility] Pager: [phone] 05/09/2018, 8:01 AM

## 2018-05-09 NOTE — Telephone Encounter (Signed)
-----   Message from Sondra Barges, PA-C sent at 05/09/2018  8:14 AM EDT ----- Please schedule patient for follow up BMET in 1 week. Dx: HFrEF

## 2018-05-10 LAB — BASIC METABOLIC PANEL
Anion gap: 9 (ref 5–15)
BUN: 28 mg/dL — ABNORMAL HIGH (ref 8–23)
CO2: 31 mmol/L (ref 22–32)
Calcium: 10.1 mg/dL (ref 8.9–10.3)
Chloride: 98 mmol/L (ref 98–111)
Creatinine, Ser: 1.14 mg/dL — ABNORMAL HIGH (ref 0.44–1.00)
GFR calc Af Amer: 56 mL/min — ABNORMAL LOW (ref >60)
GFR calc non Af Amer: 49 mL/min — ABNORMAL LOW (ref >60)
Glucose, Bld: 165 mg/dL — ABNORMAL HIGH (ref 70–99)
Potassium: 3.5 mmol/L (ref 3.5–5.1)
Sodium: 138 mmol/L (ref 135–145)

## 2018-05-10 LAB — GLUCOSE, CAPILLARY
Glucose-Capillary: 159 mg/dL — ABNORMAL HIGH (ref 70–99)
Glucose-Capillary: 190 mg/dL — ABNORMAL HIGH (ref 70–99)

## 2018-05-10 MED ORDER — FUROSEMIDE 40 MG PO TABS: 40.0000 mg | ORAL_TABLET | Freq: Every day | ORAL | 0 refills | Status: DC

## 2018-05-10 MED ORDER — SACUBITRIL-VALSARTAN 49-51 MG PO TABS: 1.0000 | ORAL_TABLET | Freq: Two times a day (BID) | ORAL | 0 refills | Status: DC

## 2018-05-10 MED ORDER — SPIRONOLACTONE 25 MG PO TABS: 25.0000 mg | ORAL_TABLET | Freq: Every day | ORAL | 0 refills | Status: DC

## 2018-05-10 NOTE — Discharge Summary (Signed)
[facility] -  at [facility]   PATIENT NAME: Kristina Archer    MR#:  [id number]  DATE OF BIRTH:  October 29, 1945  DATE OF ADMISSION:  05/07/2018 ADMITTING PHYSICIAN: Katha Hamming, MD  DATE OF DISCHARGE: 05/10/2018  PRIMARY CARE PHYSICIAN: Corky Downs, MD   ADMISSION DIAGNOSIS:  Acute on chronic respiratory failure with hypoxia (HCC) [J96.21] Acute on chronic congestive heart failure, unspecified heart failure type (HCC) [I50.9]  DISCHARGE DIAGNOSIS:  Active Problems:   Acute on chronic systolic heart failure (HCC)   Acute on chronic congestive heart failure (HCC)   SECONDARY DIAGNOSIS:   Past Medical History:  Diagnosis Date  . Anxiety   . Cardiomyopathy (HCC)    a. 10/2017 Echo: EF 20-25%, diff HK, mild MR. Nl RV size.  . DDD (degenerative disc disease), lumbar   . Diverticulosis   . Elevated troponin   . Essential hypertension   . GERD (gastroesophageal reflux disease)   . History of kidney stones   . Hyperlipidemia   . LBBB (left bundle branch block)   . PAF (paroxysmal atrial fibrillation) (HCC)    a.  Diagnosed 10/2017; b.  On Eliquis and amiodarone; c. CHADS2VASc => 5 (CHF, HTN, age x 1, DM, female)  . Pneumonia   . Sciatica   . Type II diabetes mellitus (HCC)      ADMITTING HISTORY  HISTORY OF PRESENT ILLNESS:  Kristina Archer  is a 73 y.o. female with a known history of systolic heart failure on small dose Lasix at home comes in because of worsening shortness of breath, cough going on for last few days associated with orthopnea, PND, toxic with O2 sats 89% on room air.  Patient recently was treated for pneumonia with antibiotics.  Found to have elevated BNP, admitting patient for CHF exacerbation.  HOSPITAL COURSE:   1. Acute respiratory failure secondary to acute on chronic systolic heart failure - IV Lasix started on admission and patient diuresed well.  Is -5 L by the time of discharge. -Losartan stopped and started on Entresto  and Aldactone - Input and Output monitored - Counseled to limit fluids and Salt - Monitor Bun/Cr and Potassium stable - Echo-reviewed -Cardiology following.  Appreciate input.  Discussed with Dr. Kirke Corin on day of discharge  Home dose of Lasix increased to 40 mg daily.  Potassium supplements continued.  Patient will be on Coreg, Aldactone, Entresto  2. hypokalemia: Replaced while being diuresed.  3.  Paroxysmal atrial fibrillation, patient is on amiodarone, Coreg, Eliquis.  4 diabetes mellitus type 2, continue Tradjenta, sliding scale insulin with coverage.  Patient stable for discharge home with home health.  Close follow-up with cardiology.   CONSULTS OBTAINED:  Treatment Team:  Iran Ouch, MD  DRUG ALLERGIES:   Allergies  Allergen Reactions  . Codeine Nausea Only    DISCHARGE MEDICATIONS:   Allergies as of 05/10/2018      Reactions   Codeine Nausea Only      Medication List    STOP taking these medications   lisinopril 5 MG tablet Commonly known as:  ZESTRIL     TAKE these medications   amiodarone 200 MG tablet Commonly known as:  PACERONE Take 1 tablet (200 mg total) by mouth daily.   apixaban 5 MG Tabs tablet Commonly known as:  ELIQUIS Take 1 tablet (5 mg total) by mouth 2 (two) times daily. What changed:  when to take this   carvedilol 3.125 MG tablet Commonly known as:  COREG Take  1 tablet (3.125 mg total) by mouth 2 (two) times daily with a meal.   furosemide 40 MG tablet Commonly known as:  LASIX Take 1 tablet (40 mg total) by mouth daily. What changed:   medication strength how much to take   PARoxetine 20 MG tablet Commonly known as:  PAXIL Take 20 mg by mouth at bedtime.   potassium chloride 10 MEQ tablet Commonly known as:  K-DUR Take 1 tablet (10 mEq total) by mouth daily.   rosuvastatin 40 MG tablet Commonly known as:  CRESTOR Take 1 tablet (40 mg total) by mouth daily.   sacubitril-valsartan 49-51 MG Commonly known  as:  ENTRESTO Take 1 tablet by mouth 2 (two) times daily.   sitaGLIPtin 100 MG tablet Commonly known as:  JANUVIA Take 100 mg by mouth daily.   spironolactone 25 MG tablet Commonly known as:  ALDACTONE Take 1 tablet (25 mg total) by mouth daily. Start taking on:  May 11, 2018   traZODone 50 MG tablet Commonly known as:  DESYREL Take 50 mg by mouth at bedtime.       Today   VITAL SIGNS:  Blood pressure 126/65, pulse 61, temperature 97.6 F (36.4 C), temperature source Oral, resp. rate 19, height 5\' 2"  (1.575 m), weight 71.5 kg, SpO2 94 %.  I/O:    Intake/Output Summary (Last 24 hours) at 05/10/2018 1337 Last data filed at 05/10/2018 0937 Gross per 24 hour  Intake -  Output 1500 ml  Net -1500 ml    PHYSICAL EXAMINATION:  Physical Exam  GENERAL:  73 y.o.-year-old patient lying in the bed with no acute distress.  LUNGS: Normal breath sounds bilaterally, no wheezing, rales,rhonchi or crepitation. No use of accessory muscles of respiration.  CARDIOVASCULAR: S1, S2 normal. No murmurs, rubs, or gallops.  ABDOMEN: Soft, non-tender, non-distended. Bowel sounds present. No organomegaly or mass.  NEUROLOGIC: Moves all 4 extremities. PSYCHIATRIC: The patient is alert and oriented x 3.  SKIN: No obvious rash, lesion, or ulcer.   DATA REVIEW:   CBC Recent Labs  Lab 05/08/18 0308  WBC 7.4  HGB 10.3*  HCT 31.0*  PLT 119*    Chemistries  Recent Labs  Lab 05/07/18 0803  05/10/18 0313  NA 136   < > 138  K 3.2*   < > 3.5  CL 104   < > 98  CO2 24   < > 31  GLUCOSE 191*   < > 165*  BUN 11   < > 28*  CREATININE 0.95   < > 1.14*  CALCIUM 9.7   < > 10.1  MG 1.7  --   --    < > = values in this interval not displayed.    Cardiac Enzymes Recent Labs  Lab 05/07/18 0803  TROPONINI 0.05*    Microbiology Results  Results for orders placed or performed during the hospital encounter of 05/07/18  SARS Coronavirus 2 [facility](Hospital order, Performed in [facility] hospital lab)      Status: None   Collection Time: 05/07/18  8:07 AM  Result Value Ref Range Status   SARS Coronavirus 2 NEGATIVE NEGATIVE Final    Comment: (NOTE) If result is NEGATIVE SARS-CoV-2 target nucleic acids are NOT DETECTED. The SARS-CoV-2 RNA is generally detectable in upper and lower  respiratory specimens during the acute phase of infection. The lowest  concentration of SARS-CoV-2 viral copies this assay can detect is 250  copies / mL. A negative result does not preclude SARS-CoV-2 infection  and should not be used as the sole basis for treatment or other  patient management decisions.  A negative result may occur with  improper specimen collection / handling, submission of specimen other  than nasopharyngeal swab, presence of viral mutation(s) within the  areas targeted by this assay, and inadequate number of viral copies  (<250 copies / mL). A negative result must be combined with clinical  observations, patient history, and epidemiological information. If result is POSITIVE SARS-CoV-2 target nucleic acids are DETECTED. The SARS-CoV-2 RNA is generally detectable in upper and lower  respiratory specimens dur ing the acute phase of infection.  Positive  results are indicative of active infection with SARS-CoV-2.  Clinical  correlation with patient history and other diagnostic information is  necessary to determine patient infection status.  Positive results do  not rule out bacterial infection or co-infection with other viruses. If result is PRESUMPTIVE POSTIVE SARS-CoV-2 nucleic acids MAY BE PRESENT.   A presumptive positive result was obtained on the submitted specimen  and confirmed on repeat testing.  While 2019 novel coronavirus  (SARS-CoV-2) nucleic acids may be present in the submitted sample  additional confirmatory testing may be necessary for epidemiological  and / or clinical management purposes  to differentiate between  SARS-CoV-2 and other Sarbecovirus currently known  to infect humans.  If clinically indicated additional testing with an alternate test  methodology [phone]) is advised. The SARS-CoV-2 RNA is generally  detectable in upper and lower respiratory sp ecimens during the acute  phase of infection. The expected result is Negative. Fact Sheet for Patients:  BoilerBrush.com.cy Fact Sheet for Healthcare Providers: https://pope.com/ This test is not yet approved or cleared by the [place] FDA and has been authorized for detection and/or diagnosis of SARS-CoV-2 by FDA under an Emergency Use Authorization (EUA).  This EUA will remain in effect (meaning this test can be used) for the duration of the COVID-19 declaration under Section 564(b)(1) of the Act, 21 U.S.C. section 360bbb-3(b)(1), unless the authorization is terminated or revoked sooner. Performed at [facility], [address] [place], [place] [address]     RADIOLOGY:  No results found.  Follow up with PCP in 1 week.  Management plans discussed with the patient, family and they are in agreement.  CODE STATUS:     Code Status Orders  (From admission, onward)         Start     Ordered   05/07/18 1031  Full code  Continuous     05/07/18 1032        Code Status History    Date Active Date Inactive Code Status Order ID Comments User Context   04/22/2018 0956 04/25/2018 1953 Full Code [id number]  Campbell Stall, MD Inpatient   03/02/2018 0322 03/07/2018 1659 Full Code [id number]  Oralia Manis, MD ED   02/01/2018 0550 02/04/2018 1538 Full Code [id number]  Arnaldo Natal, MD ED   12/18/2017 0210 12/22/2017 1710 DNR [id number]  Cammy Copa, MD Inpatient   11/04/2017 1129 11/13/2017 1657 DNR [id number]  Katha Hamming, MD Inpatient   11/02/2017 1524 11/04/2017 1129 Full Code [id number]  Katha Hamming, MD ED      TOTAL TIME TAKING CARE OF THIS PATIENT ON DAY OF DISCHARGE: more than 30 minutes.   Molinda Bailiff  Dessie Delcarlo M.D on 05/10/2018 at 1:37 PM  Between 7am to 6pm - Pager - [phone]  After 6pm go to [email or web address] - password [email or web address]  [facility] [facility]  Office  [phone]  CC: Primary care physician; Corky Downs, MD  Note: This dictation was prepared with Dragon dictation along with smaller phrase technology. Any transcriptional errors that result from this process are unintentional.

## 2018-05-10 NOTE — Telephone Encounter (Signed)
Patient is currently admitted. Call pt 05/11/18.

## 2018-05-10 NOTE — Progress Notes (Signed)
Patient discharged to home in no distress. Discharge instructions reviewed with patient and she has verbalized understanding of all medications and appt. Telemetry and IV were d/c'd. Pt was transported to discharge via WC.

## 2018-05-10 NOTE — Progress Notes (Signed)
Advance care planning  Purpose of Encounter CHF  Parties in Attendance Patient  Patients Decisional capacity Alert and oriented.  Able to make medical decisions.  Documented healthcare power of attorney is her son.    She tells me he does not understand much about her congestive heart failure and how she feels.  No ACP documents in place.  Discussed in detail regarding .  Treatment plan , prognosis discussed.  All questions answered Lengthy discussion about fluid and salt restriction, Lasix and close follow-up with cardiology  CODE STATUS - FULL CODE  Time spent - 17 minutes

## 2018-05-10 NOTE — Progress Notes (Signed)
Progress Note  Patient Name: Kristina Archer Date of Encounter: 05/10/2018  Primary Cardiologist: Mariah Milling  Subjective   She feels significantly better with less shortness of breath and orthopnea.  Her weight is down 3 kg since admission and she is -5 L.  Inpatient Medications    Scheduled Meds: . amiodarone  200 mg Oral Daily  . apixaban  5 mg Oral Daily  . carvedilol  3.125 mg Oral BID WC  . docusate sodium  100 mg Oral BID  . feeding supplement (GLUCERNA SHAKE)  237 mL Oral TID BM  . insulin aspart  0-15 Units Subcutaneous TID WC  . insulin aspart  0-5 Units Subcutaneous QHS  . linagliptin  5 mg Oral Daily  . PARoxetine  20 mg Oral QHS  . potassium chloride  20 mEq Oral Daily  . rosuvastatin  40 mg Oral Daily  . sacubitril-valsartan  1 tablet Oral BID  . spironolactone  25 mg Oral Daily  . traZODone  50 mg Oral QHS   Continuous Infusions:  PRN Meds: acetaminophen **OR** acetaminophen, bisacodyl, ondansetron **OR** ondansetron (ZOFRAN) IV, traZODone   Vital Signs    Vitals:   05/09/18 2052 05/10/18 0426 05/10/18 0448 05/10/18 0726  BP: (!) 125/53 121/62  126/65  Pulse: (!) 58 62  61  Resp: 16 16  19   Temp: 98.5 F (36.9 C) 97.8 F (36.6 C)  97.6 F (36.4 C)  TempSrc: Oral Oral  Oral  SpO2: 94% 93%  94%  Weight:   71.5 kg   Height:        Intake/Output Summary (Last 24 hours) at 05/10/2018 0902 Last data filed at 05/10/2018 0449 Gross per 24 hour  Intake -  Output 2100 ml  Net -2100 ml   Filed Weights   05/08/18 0501 05/09/18 0508 05/10/18 0448  Weight: 74 kg 74.4 kg 71.5 kg    Telemetry    Sinus bradycardia to NSR - Personally Reviewed  ECG    n/a - Personally Reviewed  Physical Exam   GEN: No acute distress.   Neck: No JVD Cardiac: RRR, no murmurs, rubs, or gallops.  Respiratory: Clear to auscultation bilaterally.  GI: Soft, nontender, non-distended.   MS: No edema; No deformity. Compression stockings noted.  Neuro:  Alert and oriented  x 3; Nonfocal.  Psych: Normal affect.  Labs    Chemistry Recent Labs  Lab 05/08/18 0308 05/09/18 0549 05/10/18 0313  NA 136 140 138  K 4.1 3.7 3.5  CL 104 100 98  CO2 24 29 31   GLUCOSE 305* 125* 165*  BUN 20 25* 28*  CREATININE 1.02* 1.02* 1.14*  CALCIUM 9.8 9.9 10.1  GFRNONAA 56* 56* 49*  GFRAA >60 >60 56*  ANIONGAP 8 11 9      Hematology Recent Labs  Lab 05/07/18 0803 05/08/18 0308  WBC 9.2 7.4  RBC 4.03 3.82*  HGB 11.0* 10.3*  HCT 33.0* 31.0*  MCV 81.9 81.2  MCH 27.3 27.0  MCHC 33.3 33.2  RDW 16.9* 16.8*  PLT 115* 119*    Cardiac Enzymes Recent Labs  Lab 05/07/18 0803  TROPONINI 0.05*   No results for input(s): TROPIPOC in the last 168 hours.   BNP Recent Labs  Lab 05/07/18 0749  BNP 1,457.0*     DDimer No results for input(s): DDIMER in the last 168 hours.   Radiology    Dg Chest Portable 1 View  Result Date: 05/07/2018 IMPRESSION: 1. Interval extubation. 2. Cardiomegaly and mild pulmonary vascular  congestion. This likely represents some element of congestive heart failure. 3. Bilateral pleural effusions and airspace disease, likely atelectasis. Electronically Signed   By: Marin Robertshristopher  Mattern M.D.   On: 05/07/2018 08:52    Cardiac Studies   2D Echo 05/07/2018 1. The left ventricle has severely reduced systolic function, with an ejection fraction of 20-25%. The cavity size was moderately dilated. Left ventricular diastolic Doppler parameters are consistent with impaired relaxation. 2. The right ventricle has normal systolic function. The cavity was normal. There is no increase in right ventricular wall thickness. Right ventricular systolic pressure could not be assessed. 3. Left atrial size was mildly dilated. 4. The mitral valve is grossly normal. 5. The tricuspid valve is grossly normal. 6. The aortic valve is grossly normal. Mild thickening of the aortic valve. Mild calcification of the aortic valve. Aortic valve regurgitation was not  assessed by color flow Doppler.  Patient Profile     73 y.o. female with history of HFrEF with prior refusal of ischemic evaluation, PAF (10/2017) on Eliquis, SVT, LBBB, anemia, recurrent ureteral stone requiring prior cystoscopy and ureteral stenting,previous smoker,DM2, hypertension, and hyperlipidemia who is seen today for the evaluation of acute on chronic HFrEF at the request of Dr. Luberta MutterKonidena.  Assessment & Plan    1. Acute on chronic HFrEF with known LBBB: -She does not appear to be volume overloaded anymore.  If anything, she might be slightly volume depleted due to mildly elevated creatinine and BUN.  I recommend holding furosemide today and resuming tomorrow at 40 mg by mouth once daily.  She was on 20 mg daily at home.  We also did switch her from [facility]op[facility] to [facility] and added spironolactone. -In follow up, we will continue to discuss potential cath and consideration for possible CRT-D -We will arrange for close follow-up in our office within 1 to 2 weeks.  2. PAF: -Maintaining sinus rhythm -Continue amiodarone and Coreg -Continue Eliquis given a CHADS2VASc of at least 5 (CHF, HTN, age x 1, vascular disease, female) -Recent LFT and TSH normal  3. Elevated troponin: -No chest pain -Minimally elevated with a peak of 0.08 likely secondary to demand ischemia in the setting of volume overload -She has continued to refuse ischemic evaluation -She will need to follow up as an outpatient for further discussion  -Eliquis in place of ASA -Aggressive risk factor modification   4. HTN: -Her blood pressure is now very well controlled.  5. Hypokalemia: -Improved -Continue spironolactone, Entresto, and low dose KCl -She will need a BMET in 1 week to assess renal function and potassium   6. HLD: -Crestor  If the patient remains stable, she can be discharged home from a cardiac standpoint.  I discussed with Dr. Elpidio AnisSudini.  For questions or updates, please contact [facility] Please consult [email or web address] for contact info under Cardiology/STEMI.    Signed, Lorine BearsMuhammad , MD [facility] 05/10/2018, 9:02 AM

## 2018-05-11 NOTE — Telephone Encounter (Signed)
Visser, Jacquelyn D, PA-C  P Cv Div [facility] Triage        Good afternoon,   This patient was discharged today and will need a TCM appointment with Dr. Mariah Milling scheduled within 2 weeks.   Can you also please schedule her for a follow-up BMET 1 week from now? We started her on Entresto and Spironolactone, so we will need to check her renal function and electrolytes.   Thank you!   Sincerely,   Harvel Quale

## 2018-05-11 NOTE — Discharge Summary (Signed)
[facility] -  at [facility]   PATIENT NAME: Kristina Archer    MR#:  [id number]  DATE OF BIRTH:  03-Mar-1945  DATE OF ADMISSION:  04/22/2018 ADMITTING PHYSICIAN: Campbell Stall, MD  DATE OF DISCHARGE: 04/25/2018  4:48 PM  PRIMARY CARE PHYSICIAN: Corky Downs, MD    ADMISSION DIAGNOSIS:  Acute respiratory failure with hypoxia (HCC) [J96.01]  DISCHARGE DIAGNOSIS:  Active Problems:   Acute respiratory failure with hypoxia (HCC)   SECONDARY DIAGNOSIS:   Past Medical History:  Diagnosis Date  . Anxiety   . Cardiomyopathy (HCC)    a. 10/2017 Echo: EF 20-25%, diff HK, mild MR. Nl RV size.  . DDD (degenerative disc disease), lumbar   . Diverticulosis   . Elevated troponin   . Essential hypertension   . GERD (gastroesophageal reflux disease)   . History of kidney stones   . Hyperlipidemia   . LBBB (left bundle branch block)   . PAF (paroxysmal atrial fibrillation) (HCC)    a.  Diagnosed 10/2017; b.  On Eliquis and amiodarone; c. CHADS2VASc => 5 (CHF, HTN, age x 1, DM, female)  . Pneumonia   . Sciatica   . Type II diabetes mellitus (HCC)     HOSPITAL COURSE:   Acute hypoxic respiratory failure-secondary to pulmonary edema and CAP -COVIDtesting and RVP negative -Continue cefepime -Continue lasix  IV daily, switch to PO. -Wean to room air as able -PT eval done.  Sepsis due to CAP-meeting sepsis criteria on admission, but this has resolved. -Continue cefepime -Bloodcultures with no growth- given augmentin on discharge.  Acute exacerbation of chronic combined systolic and diastolic CHF-most recent echo 40/98 with EF 20 to 25% and grade 1 diastolic dysfunction. -Lasix as above  Paroxysmal atrial fibrillation-in normal sinus rhythm in the ED. -Continue amiodarone,Coreg,Eliquis  Hypertension-BPs elevated -Continue Coreg and lisinopril  Type 2 diabetes-bloodsugars well-controlled -SSI  Hyperlipidemia-stable -Continue  home Crestor  Normocytic anemia-hemoglobin has been trending down.  No active bleeding.  May be dilutional component. -Monitor  DISCHARGE CONDITIONS:   Stable.  CONSULTS OBTAINED:    DRUG ALLERGIES:   Allergies  Allergen Reactions  . Codeine Nausea Only    DISCHARGE MEDICATIONS:   Allergies as of 04/25/2018      Reactions   Codeine Nausea Only      Medication List    TAKE these medications   amiodarone 200 MG tablet Commonly known as:  PACERONE Take 1 tablet (200 mg total) by mouth daily.   apixaban 5 MG Tabs tablet Commonly known as:  ELIQUIS Take 1 tablet (5 mg total) by mouth 2 (two) times daily. What changed:  when to take this   carvedilol 3.125 MG tablet Commonly known as:  COREG Take 1 tablet (3.125 mg total) by mouth 2 (two) times daily with a meal.   PARoxetine 20 MG tablet Commonly known as:  PAXIL Take 20 mg by mouth at bedtime.   potassium chloride 10 MEQ tablet Commonly known as:  K-DUR Take 1 tablet (10 mEq total) by mouth daily.   rosuvastatin 40 MG tablet Commonly known as:  CRESTOR Take 1 tablet (40 mg total) by mouth daily.   sitaGLIPtin 100 MG tablet Commonly known as:  JANUVIA Take 100 mg by mouth daily. What changed:  Another medication with the same name was removed. Continue taking this medication, and follow the directions you see here.   traZODone 50 MG tablet Commonly known as:  DESYREL Take 50 mg by mouth at bedtime.  ASK your doctor about these medications   amoxicillin-clavulanate 875-125 MG tablet Commonly known as:  Augmentin Take 1 tablet by mouth 2 (two) times daily for 3 days. Ask about: Should I take this medication?        DISCHARGE INSTRUCTIONS:    Follow with PMD in 1-2 weeks.  If you experience worsening of your admission symptoms, develop shortness of breath, life threatening emergency, suicidal or homicidal thoughts you must seek medical attention immediately by calling 911 or calling your MD  immediately  if symptoms less severe.  You Must read complete instructions/literature along with all the possible adverse reactions/side effects for all the Medicines you take and that have been prescribed to you. Take any new Medicines after you have completely understood and accept all the possible adverse reactions/side effects.   Please note  You were cared for by a hospitalist during your hospital stay. If you have any questions about your discharge medications or the care you received while you were in the hospital after you are discharged, you can call the unit and asked to speak with the hospitalist on call if the hospitalist that took care of you is not available. Once you are discharged, your primary care physician will handle any further medical issues. Please note that NO REFILLS for any discharge medications will be authorized once you are discharged, as it is imperative that you return to your primary care physician (or establish a relationship with a primary care physician if you do not have one) for your aftercare needs so that they can reassess your need for medications and monitor your lab values.    Today   CHIEF COMPLAINT:   Chief Complaint  Patient presents with  . Shortness of Breath    HISTORY OF PRESENT ILLNESS:  Kristina Archer  is a 73 y.o. female with a known history of paroxysmal atrial fibrillation, type 2 diabetes, hypertension, hyperlipidemia, chronic systolic congestive heart failure, anxiety who presented to the ED with shortness of breath and dry cough for the last day.  On my exam, patient intubated so history obtained from ED physician.  On arrival to the ED, O2 sats were in the mid 80s on 6 L O2.  Patient was transitioned to nonrebreather, but continued having worsening respiratory distress, so she was intubated.  In the ED, patient was meeting sepsis criteria with leukocytosis and tachypnea.  Chest x-ray with diffuse patchy bilateral lung opacities,  asymmetric to the right, pulmonary edema versus atypical pneumonia.  She was started on broad-spectrum antibiotics.  Hospitalists were called for admission.   VITAL SIGNS:  Blood pressure (!) 144/57, pulse 60, temperature 98.4 F (36.9 C), temperature source Oral, resp. rate 20, height  (1.651 m), weight 73.9 kg, SpO2 94 %.  I/O:  No intake or output data in the 24 hours ending 05/11/18 1419  PHYSICAL EXAMINATION:   GENERAL:Laying in the bed with no acute distress.  HEENT: Head atraumatic, normocephalic. Pupils equal, round, reactive to light and accommodation. No scleral icterus. Extraocular muscles intact. Oropharynx and nasopharynx clear.  NECK: Supple, no jugular venous distention. No thyroid enlargement. LUNGS:+ Diminished breath sounds in the lung bases bilaterally.  Nasal cannula in place.  No crackles or wheezing.  Able to speak in full sentences. CARDIOVASCULAR: RRR, S1, S2 normal. No murmurs, rubs, or gallops.  ABDOMEN: Soft, nontender, nondistended. Bowel sounds present.  EXTREMITIES: No pedal edema, cyanosis, or clubbing.  NEUROLOGIC:CN 2-12 intact, no focal deficits. +global weakness. Sensation intact throughout. Gait not  checked.  PSYCHIATRIC: The patient is alert and oriented x 3.  SKIN: No obvious rash, lesion, or ulcer.   DATA REVIEW:   CBC Recent Labs  Lab 05/08/18 0308  WBC 7.4  HGB 10.3*  HCT 31.0*  PLT 119*    Chemistries  Recent Labs  Lab 05/07/18 0803  05/10/18 0313  NA 136   < > 138  K 3.2*   < > 3.5  CL 104   < > 98  CO2 24   < > 31  GLUCOSE 191*   < > 165*  BUN 11   < > 28*  CREATININE 0.95   < > 1.14*  CALCIUM 9.7   < > 10.1  MG 1.7  --   --    < > = values in this interval not displayed.    Cardiac Enzymes Recent Labs  Lab 05/07/18 0803  TROPONINI 0.05*    Microbiology Results  Results for orders placed or performed during the hospital encounter of 04/22/18  Blood Culture (routine x 2)     Status: None   Collection  Time: 04/22/18  6:15 AM  Result Value Ref Range Status   Specimen Description BLOOD RIGHT HAND  Final   Special Requests   Final    BOTTLES DRAWN AEROBIC AND ANAEROBIC Blood Culture results may not be optimal due to an excessive volume of blood received in culture bottles   Culture   Final    NO GROWTH 5 DAYS Performed at [facility], [address] [place], [place] [address]    Report Status 04/27/2018 FINAL  Final  SARS Coronavirus 2 [facility](Hospital order, Performed in [facility])     Status: None   Collection Time: 04/22/18  6:44 AM  Result Value Ref Range Status   SARS Coronavirus 2 NEGATIVE NEGATIVE Final    Comment: (NOTE) If result is NEGATIVE SARS-CoV-2 target nucleic acids are NOT DETECTED. The SARS-CoV-2 RNA is generally detectable in upper and lower  respiratory specimens during the acute phase of infection. The lowest  concentration of SARS-CoV-2 viral copies this assay can detect is 250  copies / mL. A negative result does not preclude SARS-CoV-2 infection  and should not be used as the sole basis for treatment or other  patient management decisions.  A negative result may occur with  improper specimen collection / handling, submission of specimen other  than nasopharyngeal swab, presence of viral mutation(s) within the  areas targeted by this assay, and inadequate number of viral copies  (<250 copies / mL). A negative result must be combined with clinical  observations, patient history, and epidemiological information. If result is POSITIVE SARS-CoV-2 target nucleic acids are DETECTED. The SARS-CoV-2 RNA is generally detectable in upper and lower  respiratory specimens dur ing the acute phase of infection.  Positive  results are indicative of active infection with SARS-CoV-2.  Clinical  correlation with patient history and other diagnostic information is  necessary to determine patient infection status.  Positive results do  not rule out bacterial  infection or co-infection with other viruses. If result is PRESUMPTIVE POSTIVE SARS-CoV-2 nucleic acids MAY BE PRESENT.   A presumptive positive result was obtained on the submitted specimen  and confirmed on repeat testing.  While 2019 novel coronavirus  (SARS-CoV-2) nucleic acids may be present in the submitted sample  additional confirmatory testing may be necessary for epidemiological  and / or clinical management purposes  to differentiate between  SARS-CoV-2 and other  Sarbecovirus currently known to infect humans.  If clinically indicated additional testing with an alternate test  methodology [phone]) is advised. The SARS-CoV-2 RNA is generally  detectable in upper and lower respiratory sp ecimens during the acute  phase of infection. The expected result is Negative. Fact Sheet for Patients:  BoilerBrush.com.cy Fact Sheet for Healthcare Providers: https://pope.com/ This test is not yet approved or cleared by the [place] FDA and has been authorized for detection and/or diagnosis of SARS-CoV-2 by FDA under an Emergency Use Authorization (EUA).  This EUA will remain in effect (meaning this test can be used) for the duration of the COVID-19 declaration under Section 564(b)(1) of the Act, 21 U.S.C. section 360bbb-3(b)(1), unless the authorization is terminated or revoked sooner. Performed at [facility], [address], [place], [place] [address]   Blood Culture (routine x 2)     Status: None   Collection Time: 04/22/18  8:32 AM  Result Value Ref Range Status   Specimen Description BLOOD RAC  Final   Special Requests   Final    BOTTLES DRAWN AEROBIC AND ANAEROBIC Blood Culture results may not be optimal due to an excessive volume of blood received in culture bottles   Culture   Final    NO GROWTH 5 DAYS Performed at [facility], [address] [place], [place] [address]    Report Status 04/27/2018 FINAL   Final  MRSA PCR Screening     Status: None   Collection Time: 04/22/18  9:37 AM  Result Value Ref Range Status   MRSA by PCR NEGATIVE NEGATIVE Final    Comment:        The GeneXpert MRSA Assay (FDA approved for NASAL specimens only), is one component of a comprehensive MRSA colonization surveillance program. It is not intended to diagnose MRSA infection nor to guide or monitor treatment for MRSA infections. Performed at [facility], [address] [place], [place] [address]   Respiratory Panel by PCR     Status: None   Collection Time: 04/22/18 10:53 AM  Result Value Ref Range Status   Adenovirus NOT DETECTED NOT DETECTED Final   Coronavirus 229E NOT DETECTED NOT DETECTED Final    Comment: (NOTE) The Coronavirus on the Respiratory Panel, DOES NOT test for the novel  Coronavirus (2019 nCoV)    Coronavirus HKU1 NOT DETECTED NOT DETECTED Final   Coronavirus NL63 NOT DETECTED NOT DETECTED Final   Coronavirus OC43 NOT DETECTED NOT DETECTED Final   Metapneumovirus NOT DETECTED NOT DETECTED Final   Rhinovirus / Enterovirus NOT DETECTED NOT DETECTED Final   Influenza A NOT DETECTED NOT DETECTED Final   Influenza B NOT DETECTED NOT DETECTED Final   Parainfluenza Virus 1 NOT DETECTED NOT DETECTED Final   Parainfluenza Virus 2 NOT DETECTED NOT DETECTED Final   Parainfluenza Virus 3 NOT DETECTED NOT DETECTED Final   Parainfluenza Virus 4 NOT DETECTED NOT DETECTED Final   Respiratory Syncytial Virus NOT DETECTED NOT DETECTED Final   Bordetella pertussis NOT DETECTED NOT DETECTED Final   Chlamydophila pneumoniae NOT DETECTED NOT DETECTED Final   Mycoplasma pneumoniae NOT DETECTED NOT DETECTED Final    Comment: Performed at [facility], [address] [place], [place] [address]  Culture, respiratory (non-expectorated)     Status: None   Collection Time: 04/22/18 10:53 AM  Result Value Ref Range Status   Specimen Description   Final    TRACHEAL ASPIRATE Performed at  [facility], [address] [facility]  [address], [place], [place] [address]    Special Requests   Final    Normal Performed at [facility], [address] [place], [place] [address]    Gram Stain   Final    FEW WBC PRESENT, PREDOMINANTLY PMN RARE GRAM POSITIVE COCCI RARE ANAEROBIC BOTTLE ONLY    Culture   Final    Consistent with normal respiratory flora. Performed at [facility], [address] [place], [place] [address]    Report Status 04/24/2018 FINAL  Final    RADIOLOGY:  No results found.  EKG:   Orders placed or performed during the hospital encounter of 04/22/18  . ED EKG  . ED EKG  . ED EKG 12-Lead  . ED EKG 12-Lead  . EKG 12-Lead  . EKG 12-Lead  . EKG 12-Lead  . EKG 12-Lead  . EKG      Management plans discussed with the patient, family and they are in agreement.  CODE STATUS:  Code Status History    Date Active Date Inactive Code Status Order ID Comments User Context   05/07/2018 1032 05/10/2018 1909 Full Code [id number]  Katha Hamming, MD ED   04/22/2018 0956 04/25/2018 1953 Full Code [id number]  Mayo, Allyn Kenner, MD Inpatient   03/02/2018 0322 03/07/2018 1659 Full Code [id number]  Oralia Manis, MD ED   02/01/2018 0550 02/04/2018 1538 Full Code [id number]  Arnaldo Natal, MD ED   12/18/2017 0210 12/22/2017 1710 DNR [id number]  Cammy Copa, MD Inpatient   11/04/2017 1129 11/13/2017 1657 DNR [id number]  Katha Hamming, MD Inpatient   11/02/2017 1524 11/04/2017 1129 Full Code [id number]  Katha Hamming, MD ED      TOTAL TIME TAKING CARE OF THIS PATIENT: 35 minutes.    Altamese Dilling M.D on 05/11/2018 at 2:19 PM  Between 7am to 6pm - Pager - [phone]  After 6pm go to [email or web address] - password [email or web address]  [facility]  Office  [phone]  CC: Primary care physician; Corky Downs, MD   Note: This dictation was prepared with Dragon dictation along with smaller phrase technology. Any transcriptional  errors that result from this process are unintentional.

## 2018-05-14 ENCOUNTER — Telehealth: Payer: Self-pay | Admitting: Cardiovascular Disease

## 2018-05-14 NOTE — Telephone Encounter (Signed)
TELEPHONE CALL NOTE  Kristina Archer has been deemed a candidate for a follow-up tele-health visit to limit community exposure during the Covid-19 pandemic. I spoke with the patient via phone to ensure availability of phone/video source, confirm preferred email & phone number, discuss instructions and expectations, and review consent.   I reminded Kristina Archer to be prepared with any vital sign and/or heart rhythm information that could potentially be obtained via home monitoring, at the time of her visit.  Finally, I reminded Kristina Archer to expect an e-mail containing a link for their video-based visit approximately 15 minutes before her visit, or alternatively, a phone call at the time of her visit if her visit is planned to be a phone encounter.  Did the patient verbally consent to treatment as below? YES  ,  L, CMA 05/14/2018 10:24 AM  CONSENT FOR TELE-HEALTH VISIT - PLEASE REVIEW  I hereby voluntarily request, consent and authorize [facility] and its employed or contracted physicians, physician assistants, nurse practitioners or other licensed health care professionals (the Practitioner), to provide me with telemedicine health care services (the "Services") as deemed necessary by the treating Practitioner. I acknowledge and consent to receive the Services by the Practitioner via telemedicine. I understand that the telemedicine visit will involve communicating with the Practitioner through telephonic communication technology and the disclosure of certain medical information by electronic transmission. I acknowledge that I have been given the opportunity to request an in-person assessment or other available alternative prior to the telemedicine visit and am voluntarily participating in the telemedicine visit.  I understand that I have the right to withhold or withdraw my consent to the use of telemedicine in the course of my care at any time, without affecting my  right to future care or treatment, and that the Practitioner or I may terminate the telemedicine visit at any time. I understand that I have the right to inspect all information obtained and/or recorded in the course of the telemedicine visit and may receive copies of available information for a reasonable fee.  I understand that some of the potential risks of receiving the Services via telemedicine include:  Marland Kitchen Delay or interruption in medical evaluation due to technological equipment failure or disruption; . Information transmitted may not be sufficient (e.g. poor resolution of images) to allow for appropriate medical decision making by the Practitioner; and/or  . In rare instances, security protocols could fail, causing a breach of personal health information.  Furthermore, I acknowledge that it is my responsibility to provide information about my medical history, conditions and care that is complete and accurate to the best of my ability. I acknowledge that Practitioner's advice, recommendations, and/or decision may be based on factors not within their control, such as incomplete or inaccurate data provided by me or lack of visual representation. I understand that the practice of medicine is not an exact science and that Practitioner makes no warranties or guarantees regarding treatment outcomes. I acknowledge that I will receive a copy of this consent concurrently upon execution via email to the email address I last provided but may also request a printed copy by calling the office of [facility].    I understand that my insurance may be billed for this visit.   I have read or had this consent read to me. . I understand the contents of this consent, which adequately explains the benefits and risks of the Services being provided via telemedicine.  Marland Kitchen  I have been provided ample opportunity to ask questions regarding this consent and the Services and have had my questions answered to my  satisfaction. . I give my informed consent for the services to be provided through the use of telemedicine in my medical care  By participating in this telemedicine visit I agree to the above.

## 2018-05-14 NOTE — Telephone Encounter (Signed)
Pt states at night she is staying awake, and thinks it is her Entresto. States last night she did not take it, and slept much better. Please call to discuss.

## 2018-05-14 NOTE — Telephone Encounter (Signed)
   TELEPHONE CALL NOTE  This patient has been deemed a candidate for follow-up tele-health visit to limit community exposure during the Covid-19 pandemic. I spoke with the patient via phone to discuss instructions. The patient was advised to review the section on consent for treatment as well. The patient will receive a phone call 2-3 days prior to their E-Visit at which time consent will be verbally confirmed. A Virtual Office Visit appointment type has been scheduled for 05/15/2018 with Clarisa Kindred FNP.  Nira Retort L, CMA 05/14/2018 10:23 AM

## 2018-05-15 ENCOUNTER — Ambulatory Visit: Payer: Medicare Other | Attending: Family | Admitting: Family

## 2018-05-15 ENCOUNTER — Encounter: Payer: Self-pay | Admitting: Family

## 2018-05-15 ENCOUNTER — Other Ambulatory Visit: Payer: Self-pay

## 2018-05-15 ENCOUNTER — Telehealth: Payer: Self-pay

## 2018-05-15 VITALS — BP 128/67 | Wt 153.0 lb

## 2018-05-15 DIAGNOSIS — K5900 Constipation, unspecified: Secondary | ICD-10-CM

## 2018-05-15 DIAGNOSIS — I130 Hypertensive heart and chronic kidney disease with heart failure and stage 1 through stage 4 chronic kidney disease, or unspecified chronic kidney disease: Secondary | ICD-10-CM | POA: Diagnosis not present

## 2018-05-15 DIAGNOSIS — I5022 Chronic systolic (congestive) heart failure: Secondary | ICD-10-CM

## 2018-05-15 DIAGNOSIS — I10 Essential (primary) hypertension: Secondary | ICD-10-CM

## 2018-05-15 NOTE — Telephone Encounter (Signed)
Patient contacted regarding discharge from [facility] on 4/30.  Patient understands to follow up with provider Dr. Kirke Corin on 5/15 at 11AM at [facility]. Patient understands discharge instructions? yes Patient understands medications and regiment? yes Patient understands to bring all medications to this visit? yes   E visit consented.  Pt reports that Entresto was scheduled BID. She was unable to sleep well when she took HS, so she is taking at 6 PM per PCP recommendation.   Attempted to speak with patient regarding blood work recommended by provider at d/c.   She refuses saying, "I will not go to the hospital in this mess. I just don't feel up to it. We are just going to have to put it off". Pt sounding very frustrated with call.   Routed to ordering provider as Lorain Childes.

## 2018-05-15 NOTE — Patient Instructions (Signed)
Continue weighing daily and call for an overnight weight gain of > 2 pounds or a weekly weight gain of >5 pounds. 

## 2018-05-15 NOTE — Telephone Encounter (Signed)
   TELEPHONE CALL NOTE  This patient has been deemed a candidate for follow-up tele-health visit to limit community exposure during the Covid-19 pandemic. I spoke with the patient via phone to discuss instructions. The patient was advised to review the section on consent for treatment as well. The patient will receive a phone call 2-3 days prior to their E-Visit at which time consent will be verbally confirmed. A Virtual Office Visit appointment type has been scheduled for 05/15/2018 with [facility].  Ellamay Fors L, CMA 05/15/2018 9:06 AM

## 2018-05-15 NOTE — Telephone Encounter (Signed)
TELEPHONE CALL NOTE  Kristina Archer has been deemed a candidate for a follow-up tele-health visit to limit community exposure during the Covid-19 pandemic. I spoke with the patient via phone to ensure availability of phone/video source, confirm preferred email & phone number, discuss instructions and expectations, and review consent.   I reminded Kristina Archer to be prepared with any vital sign and/or heart rhythm information that could potentially be obtained via home monitoring, at the time of her visit.  Finally, I reminded Kristina Archer to expect an e-mail containing a link for their video-based visit approximately 15 minutes before her visit, or alternatively, a phone call at the time of her visit if her visit is planned to be a phone encounter.  Did the patient verbally consent to treatment as below? YES  Kristina Archer, CMA 05/15/2018 9:06 AM  CONSENT FOR TELE-HEALTH VISIT - PLEASE REVIEW  I hereby voluntarily request, consent and authorize [facility] and its employed or contracted physicians, physician assistants, nurse practitioners or other licensed health care professionals (the Practitioner), to provide me with telemedicine health care services (the "Services") as deemed necessary by the treating Practitioner. I acknowledge and consent to receive the Services by the Practitioner via telemedicine. I understand that the telemedicine visit will involve communicating with the Practitioner through telephonic communication technology and the disclosure of certain medical information by electronic transmission. I acknowledge that I have been given the opportunity to request an in-person assessment or other available alternative prior to the telemedicine visit and am voluntarily participating in the telemedicine visit.  I understand that I have the right to withhold or withdraw my consent to the use of telemedicine in the course of my care at any time, without affecting my right  to future care or treatment, and that the Practitioner or I may terminate the telemedicine visit at any time. I understand that I have the right to inspect all information obtained and/or recorded in the course of the telemedicine visit and may receive copies of available information for a reasonable fee.  I understand that some of the potential risks of receiving the Services via telemedicine include:  Marland Kitchen Delay or interruption in medical evaluation due to technological equipment failure or disruption; . Information transmitted may not be sufficient (e.g. poor resolution of images) to allow for appropriate medical decision making by the Practitioner; and/or  . In rare instances, security protocols could fail, causing a breach of personal health information.  Furthermore, I acknowledge that it is my responsibility to provide information about my medical history, conditions and care that is complete and accurate to the best of my ability. I acknowledge that Practitioner's advice, recommendations, and/or decision may be based on factors not within their control, such as incomplete or inaccurate data provided by me or lack of visual representation. I understand that the practice of medicine is not an exact science and that Practitioner makes no warranties or guarantees regarding treatment outcomes. I acknowledge that I will receive a copy of this consent concurrently upon execution via email to the email address I last provided but may also request a printed copy by calling the office of [facility].    I understand that my insurance may be billed for this visit.   I have read or had this consent read to me. . I understand the contents of this consent, which adequately explains the benefits and risks of the Services being provided via telemedicine.  Marland Kitchen  I have been provided ample opportunity to ask questions regarding this consent and the Services and have had my questions answered to my  satisfaction. . I give my informed consent for the services to be provided through the use of telemedicine in my medical care  By participating in this telemedicine visit I agree to the above.

## 2018-05-15 NOTE — Progress Notes (Signed)
Virtual Visit via Telephone Note    Evaluation Performed:  Follow-up visit  This visit type was conducted due to national recommendations for restrictions regarding the COVID-19 Pandemic (e.g. social distancing).  This format is felt to be most appropriate for this patient at this time.  All issues noted in this document were discussed and addressed.  No physical exam was performed (except for noted visual exam findings with Video Visits).  Please refer to the patient's chart (MyChart message for video visits and phone note for telephone visits) for the patient's consent to telehealth for [facility]  Date:  05/15/2018   ID:  Kristina MooreBrenda A Rossa, DOB 07/31/1945, MRN [id number]  Patient Location:  [address] [place] [id number]   Provider location:   [facility] [address] [place], [place] [address]  PCP:  Corky DownsMasoud, [name], MD  Cardiologist:  Julien Nordmannimothy Gollan, MD  Electrophysiologist:  None   Chief Complaint:  fatigue  History of Present Illness:    Kristina Archer is a 73 y.o. female who presents via audio/video conferencing for a telehealth visit today.  Patient verified DOB and address.  The patient does not have symptoms concerning for COVID-19 infection (fever, chills, cough, or new SHORTNESS OF BREATH).   Patient reports moderate fatigue upon minimal exertion. She describes this as chronic in nature having been present for several years. She has associated minimal cough and constipation along with this. She denies any dizziness, palpitations, chest pain, shortness of breath, difficulty sleeping or weight gain. She has home health coming ~ twice / week. Currently gets her medications bubblepacked from the pharmacy.   Prior CV studies:   The following studies were reviewed today:  Echo report from 05/07/2018 reviewed and showed an EF of 20-25%.   Past Medical History:  Diagnosis Date  . Anxiety   . Cardiomyopathy (HCC)    a. 10/2017  Echo: EF 20-25%, diff HK, mild MR. Nl RV size.  . DDD (degenerative disc disease), lumbar   . Diverticulosis   . Elevated troponin   . Essential hypertension   . GERD (gastroesophageal reflux disease)   . History of kidney stones   . Hyperlipidemia   . LBBB (left bundle branch block)   . PAF (paroxysmal atrial fibrillation) (HCC)    a.  Diagnosed 10/2017; b.  On Eliquis and amiodarone; c. CHADS2VASc => 5 (CHF, HTN, age x 1, DM, female)  . Pneumonia   . Sciatica   . Type II diabetes mellitus (HCC)    Past Surgical History:  Procedure Laterality Date  . ABDOMINAL HYSTERECTOMY    . CHOLECYSTECTOMY    . CYSTOSCOPY W/ RETROGRADES Bilateral 05/24/2017   Procedure: CYSTOSCOPY WITH RETROGRADE PYELOGRAM;  Surgeon: [name], [name], MD;  Location: [facility];  Service: Urology;  Laterality: Bilateral;  . CYSTOSCOPY W/ RETROGRADES Right 01/29/2018   Procedure: CYSTOSCOPY WITH RETROGRADE PYELOGRAM;  Surgeon: [name], [name], MD;  Location: [facility];  Service: Urology;  Laterality: Right;  . CYSTOSCOPY W/ RETROGRADES Right 03/02/2018   Procedure: CYSTOSCOPY WITH RETROGRADE PYELOGRAM;  Surgeon: Sondra ComeSninsky, Brian C, MD;  Location: [facility];  Service: Urology;  Laterality: Right;  . CYSTOSCOPY W/ RETROGRADES Right 03/30/2018   Procedure: CYSTOSCOPY WITH RETROGRADE PYELOGRAM;  Surgeon: Sondra ComeSninsky, Brian C, MD;  Location: [facility];  Service: Urology;  Laterality: Right;  . CYSTOSCOPY W/ URETERAL STENT PLACEMENT Right 11/02/2017   Procedure: CYSTOSCOPY WITH RETROGRADE PYELOGRAM/URETERAL STENT PLACEMENT;  Surgeon: [name], [name], MD;  Location: [facility];  Service: Urology;  Laterality: Right;  . CYSTOSCOPY WITH STENT PLACEMENT Right 03/02/2018   Procedure: CYSTOSCOPY WITH STENT PLACEMENT-RIGHT;  Surgeon: Sondra Come, MD;  Location: [facility];  Service: Urology;  Laterality: Right;  . CYSTOSCOPY/URETEROSCOPY/HOLMIUM LASER/STENT PLACEMENT Left 05/24/2017   Procedure: CYSTOSCOPY/URETEROSCOPY/HOLMIUM LASER/STENT  PLACEMENT;  Surgeon: Vanna Scotland, MD;  Location: [facility];  Service: Urology;  Laterality: Left;  . CYSTOSCOPY/URETEROSCOPY/HOLMIUM LASER/STENT PLACEMENT Right 01/29/2018   Procedure: CYSTOSCOPY/URETEROSCOPY/HOLMIUM LASER/STENT Exchange;  Surgeon: Vanna Scotland, MD;  Location: [facility];  Service: Urology;  Laterality: Right;  . CYSTOSCOPY/URETEROSCOPY/HOLMIUM LASER/STENT PLACEMENT Right 03/30/2018   Procedure: CYSTOSCOPY/URETEROSCOPY/HOLMIUM LASER/STENT EXCHANGE;  Surgeon: Sondra Come, MD;  Location: [facility];  Service: Urology;  Laterality: Right;     Prior to Admission medications   Medication Sig Start Date End Date Taking? Authorizing Provider  amiodarone (PACERONE) 200 MG tablet Take 1 tablet (200 mg total) by mouth daily. 01/09/18  Yes Dunn, Raymon Mutton, PA-C  carvedilol (COREG) 3.125 MG tablet Take 1 tablet (3.125 mg total) by mouth 2 (two) times daily with a meal. 11/13/17  Yes Katha Hamming, MD  furosemide (LASIX) 40 MG tablet Take 1 tablet (40 mg total) by mouth daily. 05/10/18  Yes Sudini, Wardell Heath, MD  PARoxetine (PAXIL) 20 MG tablet Take 20 mg by mouth at bedtime.   Yes [provider]  potassium chloride (K-DUR) 10 MEQ tablet Take 1 tablet (10 mEq total) by mouth daily. 04/25/18  Yes Altamese Dilling, MD  rosuvastatin (CRESTOR) 40 MG tablet Take 1 tablet (40 mg total) by mouth daily. 11/13/17  Yes Katha Hamming, MD  sacubitril-valsartan (ENTRESTO) 49-51 MG Take 1 tablet by mouth 2 (two) times daily. 05/10/18  Yes Sudini, Wardell Heath, MD  sitaGLIPtin (JANUVIA) 100 MG tablet Take 100 mg by mouth daily.   Yes [provider]  spironolactone (ALDACTONE) 25 MG tablet Take 1 tablet (25 mg total) by mouth daily. 05/11/18  Yes Milagros Loll, MD  traZODone (DESYREL) 50 MG tablet Take 50 mg by mouth at bedtime.  11/30/17  Yes [provider]  apixaban (ELIQUIS) 5 MG TABS tablet Take 1 tablet (5 mg total) by mouth 2 (two) times daily. Patient taking  differently: Take 5 mg by mouth daily.  11/13/17   Katha Hamming, MD     Allergies:   Codeine   Social History   Tobacco Use  . Smoking status: Former Smoker    Packs/day: 1.50    Years: 15.00    Pack years: 22.50    Types: Cigarettes    Last attempt to quit: 05/12/1997    Years since quitting: 21.0  . Smokeless tobacco: Never Used  Substance Use Topics  . Alcohol use: Not Currently  . Drug use: Never     Family Hx: The patient's family history includes Heart attack (age of onset: 22) in her father. There is no history of Bladder Cancer or Kidney cancer.  ROS:   Please see the history of present illness.     All other systems reviewed and are negative.   Labs/Other Tests and Data Reviewed:    Recent Labs: 02/01/2018: TSH 0.544 04/22/2018: ALT 18 05/07/2018: B Natriuretic Peptide 1,457.0; Magnesium 1.7 05/08/2018: Hemoglobin 10.3; Platelets 119 05/10/2018: BUN 28; Creatinine, Ser 1.14; Potassium 3.5; Sodium 138   Recent Lipid Panel No results found for: CHOL, TRIG, HDL, CHOLHDL, LDLCALC, LDLDIRECT  Wt Readings from Last 3 Encounters:  05/15/18 153 lb (69.4 kg)  05/10/18 157 lb 11.2 oz (71.5 kg)  04/25/18 163 lb (73.9 kg)     Exam:    Vital Signs:  BP 128/67 Comment: self-reported  Wt 153 lb (69.4 kg) Comment: self-reported  BMI 27.98 kg/m    Well nourished, well developed female in no  acute distress.   ASSESSMENT & PLAN:    1. Chronic heart failure with reduced ejection fraction- - NYHA class III - euvolemic today based on patient's description of symptoms - weighing daily and says that her weight has been stable; reminded to call for an overnight weight gain of >2 pounds or a weekly weight gain of >5 pounds - not adding salt to her food and is trying to closely follow a low sodium diet    - now on entresto but is only taking it daily as she says that it was interfering with her sleep and isn't interested in resuming it BID right now; if she's only  going to take it once daily, may need to take her off of it and place her on ACE-I.  - has telemedicine visit with cardiology(Arida) on 05/25/2018 - discussed paramedicine visit with patient and she is interested so will make that referral - BNP from 05/07/2018 was 1457.0  2: HTN- - follows with PCP (Masoud) - self-reported BP today looks good - BMP from 05/10/2018 reviewed and showed sodium 138, potassium 3.5, creatinine 1.14.79 and GFR 49   3: Constipation- - patient expresses frustration with feeling constipated - currently taking miralax along with a stool softener but without great relief - is drinking ~ of water daily but says that she used to drink as much water as she wanted - discussed decreasing her furosemide down to 20mg  but she doesn't want to make that change right now; encouraged her to follow-up with her PCP if the constipation continues  COVID-19 Education: The signs and symptoms of COVID-19 were discussed with the patient and how to seek care for testing (follow up with PCP or arrange E-visit).  The importance of social distancing was discussed today.  Patient Risk:   After full review of this patients clinical status, I feel that they are at least moderate risk at this time.  Time:   Today, I have spent 9 minutes with the patient with telehealth technology discussing medications, diet, fluid intake and symptoms to report.     Medication Adjustments/Labs and Tests Ordered: Current medicines are reviewed at length with the patient today.  Concerns regarding medicines are outlined above.   Tests Ordered: No orders of the defined types were placed in this encounter.  Medication Changes: No orders of the defined types were placed in this encounter.   Disposition:  Follow-up in 6 weeks or sooner for any questions/problems before then.   Signed, Delma Freeze, FNP  05/15/2018 1:02 PM    [facility]

## 2018-05-15 NOTE — Telephone Encounter (Signed)
Spoke with patient and she mentioned that a home health nurse came out to see her today. [facility] was who she identified and I told her that I would reach out to them to get her labs done this week.   Called [facility] and spoke with Morrie Sheldon and gave her verbal order for BMET to be drawn this week and they will fax over for Dr. Mariah Milling. She also stated that they would fax verbal order form for signing and advised that I would make nurse here tomorrow aware to get this done and sent back.   No further questions at this time

## 2018-05-15 NOTE — Telephone Encounter (Signed)
See other phone note. Addresses and routed to provider.

## 2018-05-15 NOTE — Telephone Encounter (Signed)
Patient needs to get the BMET given she was started on Entresto and spironolactone.   Please let her know that the lab should not be put off.  They are important.  We need to monitor her kidneys and electrolytes. The labs ensure that her kidneys are tolerating the medication. We monitor her electrolytes to prevent imbalances that can lead to abnormal and potentially dangerous heart rhythms.

## 2018-05-16 ENCOUNTER — Telehealth: Payer: Medicare Other | Admitting: Family

## 2018-05-16 NOTE — Telephone Encounter (Signed)
No lab work received at this time.

## 2018-05-16 NOTE — Telephone Encounter (Signed)
Left voicemail message for Clinical Manager to please call back on my personal number to discuss patient care and needs.

## 2018-05-16 NOTE — Telephone Encounter (Signed)
Spoke with [facility] Case management and she states that she will be getting labs done this week. She states that patient was very difficult, refusing, constant complaints, and noncompliant. She had a difficult time getting in to see her because she wouldn't buzz her in to be seen. Requested if they could assist with virtual visit with patient during their visit but they do not know when and who is going to be in the home with her and time. So advised to get labs done and then maybe we can coordinate appointment at a different time since we need to get patients consent for visit. She verbalized understanding with no further questions at this time. Will try calling patient tomorrow for consent (Melissa states afternoon time is better).

## 2018-05-16 NOTE — Telephone Encounter (Signed)
Spoke with Rinaldo Cloud at [facility] regarding patient and she provided number to local site handling her care. # [phone] Will reach out to them to see when they are going out to see patient to possibly set up for virtual visit with Dr. Mariah Milling this week.

## 2018-05-17 ENCOUNTER — Other Ambulatory Visit (HOSPITAL_COMMUNITY): Payer: Self-pay

## 2018-05-17 ENCOUNTER — Telehealth (HOSPITAL_COMMUNITY): Payer: Self-pay | Admitting: Licensed Clinical Social Worker

## 2018-05-17 NOTE — Telephone Encounter (Signed)
Patient identified as a candidate to receive 14 heart healthy meals per week for 3 months through [facility] partnership with [employer] program.   Completed referral sent in for review.  Anticipate patient will receive first shipment of food in 1-3 business days.  Kristina Layson H. Virlan Kempker, LCSW Clinical Social Worker [facility] Desk#: [phone] Cell#: [phone]   

## 2018-05-17 NOTE — Progress Notes (Signed)
When contacted Kristina Archer about [employer] paramedic program she did not want a visit.  Asked if I could just come out and introduce myself and the program.  Today was just introduction, after talking about the program she thought it would be a great idea.  Talked about fluids and eating things like jello, ice cream and how that effects her fluids.  Measured out her fluids.  Talked about low sodium foods and read some labels.  She states it is a lot on her to count her sodium and she will just not eat sometimes thinking it is too much sodium.  Discussed Moms meals with her and she would love to try them.  [name] states there is still room, will send in referral for those.  Since the covid 19 where she lives does not cook meals for her no more and her family is not getting her groceries.  She did not have but maybe a couple days of food in her kitchen.  She has not drove since her last hospital stay and she is not sure if she can.  She has home health nurse and PT coming out.  Sh gets her medication in bubble packs, verified her medications.  She is taking her Entresto x2 daily, takes evening one at 6 instead of bed time. Will visit next week for full visit at her home.  Will visit for heart failure, diet and medication compliance.   Earmon Phoenix [place] EMT-Paramedic [phone]

## 2018-05-17 NOTE — Telephone Encounter (Signed)
No labs received at this time. Called [facility] and left message with Bonita Quin the patient coordinator to call back to check on status of lab work.

## 2018-05-18 ENCOUNTER — Encounter: Payer: Self-pay | Admitting: Cardiovascular Disease

## 2018-05-24 ENCOUNTER — Telehealth: Payer: Self-pay | Admitting: Student

## 2018-05-24 ENCOUNTER — Telehealth (HOSPITAL_COMMUNITY): Payer: Self-pay | Admitting: Licensed Clinical Social Worker

## 2018-05-24 NOTE — Telephone Encounter (Signed)
Called patient as well as son Kristina Archer to schedule new Palliative Consult with no answer.  Left messages with both requesting a call back to let me know if she wishes to pursue Palliative services or not

## 2018-05-24 NOTE — Telephone Encounter (Signed)
Received call from patient and she has refused Palliative services at this time, she stated that she has a home health nurse coming out now and may consider Palliative services when they are finished.  I explained to her that it's okay for her to receive both services at the same time but she still refused.  Will cancel referral.

## 2018-05-24 NOTE — Telephone Encounter (Signed)
CSW informed by community paramedic that pt has successfully received first delivery from [employer] but that it was left in the common area and that it was difficult to bring it up to her 3rd story apartment.   CSW called [employer] and had them add note to [employer] to delivery directly to pt apartment.  CSW will continue to follow and assist as needed  Burna Sis, LCSW Clinical Social Worker [facility] Desk#: [phone] Cell#: [phone]

## 2018-05-25 ENCOUNTER — Telehealth: Payer: Medicare Other | Admitting: Cardiovascular Disease

## 2018-05-30 ENCOUNTER — Telehealth (HOSPITAL_COMMUNITY): Payer: Self-pay

## 2018-05-30 MED ORDER — FUROSEMIDE 20 MG PO TABS: 20.0000 mg | ORAL_TABLET | Freq: Every day | ORAL | 5 refills | Status: DC

## 2018-05-30 NOTE — Telephone Encounter (Signed)
Spoke with the pt and made her aware of Eula Listen, PA recommendations. Pt sts that she has not taken Potassium since d/c from the hospital, updated the pt med list. Pt will hold lasix tomorrow and then resume 20mg  daily starting  Friday 06/01/18. Pt sts that the home health nurse will be out to her home tomorrow. Adv her that I will contact [facility] to have a repeat bmet drawn.  at the visit. Pt is concerned about fluid retention with Lasix decrease. Adv the pt continue fluid restriction, limit sodium and daily weights. Adv her to contact the office if her weight is up 3lbs in a day 5lbs in a week. Pt verbalized understanding and voiced appreciation for the call.  The pt receives medication delivery on Fridays from [facility]. E-scribe sent to [facility] with new Rx for Lasix 20mg  daily.  Contacted [facility] @ the number listed in this encounter adv that I need to speak with the  home health dept and given a different telephone number [phone]   Spoke with the pt [facility] nurse Missy, RN she will draw a BMP during her visit with the pt tomorrow. I asked if we could received the results in a timely manner. Missy sts that she rqst that the results be faxed directly to our office. Provided our fax # for results to be sent.

## 2018-05-30 NOTE — Addendum Note (Signed)
Addended by: Jarvis Newcomer on: 05/30/2018 03:33 PM   Modules accepted: Orders

## 2018-05-30 NOTE — Telephone Encounter (Signed)
Thanks. Labs from 5/8 indicate she is dry leading to the decreased dose of Lasix. Agree with recommendation of her monitoring her weight.

## 2018-05-30 NOTE — Telephone Encounter (Signed)
Labs drawn on 5/8 were received and scanned into chart on 05/21/18. Routing to Dr Mariah Milling or Alycia Rossetti to review.

## 2018-05-30 NOTE — Telephone Encounter (Signed)
Labs drawn on 5/8 show a stable potassium though slightly increased SCr of 1.3 with a baseline ~ 0.9. She should hold Lasix x 1 day then decrease to Lasix to 20 mg daily. Moving forward, with a slightly worse renal function, I recommend she hold KCl given she is on spironolactone and Entresto. She needs a bmet this week. This should be faxed to [place] from home health in a timely manor for review.

## 2018-05-30 NOTE — Telephone Encounter (Signed)
Weight today: 159 lbs  Today when contacted Kristina Archer she is tired, she states she has nursing, PT and occupational health coming everyday.  She wanted to know if I could miss today.  We had a phone visit instead.  She states feeling good, enjoying the Moms meals.  She states has no swelling.  She states the nurses keeps telling her that her vitals are good.  She denies chest pain, shortness of breath, headaches or dizziness.  She stays at home right now due to this virus.  She states getting her strength back slowly, she states will be glad when she can drive and go shopping.  She has all her medications, she gets them in bubble packs.  Verified her medications.  She states watches how much fluids she intakes.  She is talking in full sentences without any shortness of breath.  Will visit at home next week.  Will continue to visit for heart failure and diet.   Earmon Phoenix [place] EMT-Paramedic [phone]

## 2018-06-06 NOTE — Telephone Encounter (Signed)
No ans no vm   °

## 2018-06-07 ENCOUNTER — Encounter (HOSPITAL_COMMUNITY): Payer: Self-pay

## 2018-06-07 ENCOUNTER — Other Ambulatory Visit (HOSPITAL_COMMUNITY): Payer: Self-pay

## 2018-06-07 NOTE — Progress Notes (Signed)
Today was a home visit with Steward Drone.  She states doing well.  She loves the Moms meals.  She states been going a little more lately.  She denies chest pain, shortness of breath, headaches or dizziness.  She has her medications and verified them.  She has no swelling in extremities.  She denies fullness feeling in abdomen or chest.  She has 1 appt left with PT and nursing has about 2 weeks left.  She states has a kidney stone that she will have to have surgery on soon, starting to bother her.  Lungs are clear.  Will continue to visit for heart failure, diet and medication compliance.   Earmon Phoenix [place] EMT-Paramedic [phone]

## 2018-06-11 ENCOUNTER — Telehealth: Payer: Self-pay | Admitting: Cardiovascular Disease

## 2018-06-11 NOTE — Telephone Encounter (Signed)
Disregard opened in error °

## 2018-06-13 ENCOUNTER — Ambulatory Visit: Payer: Medicare Other | Admitting: Family

## 2018-06-15 ENCOUNTER — Telehealth: Payer: Self-pay

## 2018-06-15 NOTE — Telephone Encounter (Signed)
Left detailed message per DPR.  Advised renal function improved, potassium at goal.  Advised to f/u with pcp regarding calcium.  Advised to call office if any further needs.

## 2018-06-15 NOTE — Telephone Encounter (Signed)
-----   Message from Sondra Barges, PA-C sent at 06/15/2018  7:02 AM EDT ----- Renal function is improved from prior and at her ~ baseline. Potassium at goal. She needs to follow up with her PCP regarding mildly elevated calcium.

## 2018-06-18 NOTE — Telephone Encounter (Signed)
Patient declined to schedule at this time due to death in family   Will call back when ready

## 2018-06-21 ENCOUNTER — Telehealth: Payer: Self-pay

## 2018-06-21 NOTE — Telephone Encounter (Signed)
Spoke with the pt and made her aware of the scanned lab results. Pt verbalized understanding and voiced appreciation for the call. Results fwd to the pt pcp.

## 2018-06-21 NOTE — Telephone Encounter (Signed)
-----   Message from Wellington Hampshire, MD sent at 06/12/2018  5:13 PM EDT ----- This is patient was supposed to follow up with me in May. Not sure what happened.   ----- Message ----- From: Lamar Laundry, RN Sent: 06/12/2018   7:51 AM EDT To: Wellington Hampshire, MD  Scanned lab prelim reviewed and forwarded to MD desktop for review a signature.

## 2018-06-26 ENCOUNTER — Ambulatory Visit: Payer: Medicare Other | Admitting: Family

## 2018-06-28 ENCOUNTER — Telehealth (HOSPITAL_COMMUNITY): Payer: Self-pay | Admitting: Licensed Clinical Social Worker

## 2018-06-28 ENCOUNTER — Telehealth (HOSPITAL_COMMUNITY): Payer: Self-pay

## 2018-06-28 NOTE — Telephone Encounter (Signed)
CSW informed by [employer] that pt did not receive her Moms Meals delivery yesterday as she normally does.  CSW called [employer] to inquire and they show that the meals had been delayed but they should have been delivered this morning.  CSW called pt and left message inquiring if she got her meals and requesting she call me back if she still has not received them- CSW also updated paramedic  CSW will continue to follow and assist as needed  Jorge Ny, [facility] Desk#: [phone] Cell#: [phone]

## 2018-06-28 NOTE — Telephone Encounter (Signed)
Today was a telephone visit with Kristina Archer.  She states doing ok, her youngest passed away, she has been upset with that.  She traveled to [place] without problems. She states she had no swelling with traveling, she denies chest pain.  She has not had any weight gain.  She watches her sodium and fluids.  She has her meds and verified them.  Will do a home visit next week.  Will continue to visit for heart failure and diet.   [facility] [phone]

## 2018-07-02 ENCOUNTER — Ambulatory Visit: Payer: Medicare Other | Admitting: Urology

## 2018-07-05 ENCOUNTER — Telehealth (HOSPITAL_COMMUNITY): Payer: Self-pay

## 2018-07-05 NOTE — Telephone Encounter (Signed)
Had a telephone visit today with Kristina Archer.  She stated would rather have a phone visit than at home.  She states been through a lot in past 2 weeks, she had a son to pass and her brother.  She states mentally she is bad off but physically she is doing well.  She denies chest pain, shortness of breath, headaches or dizziness.  She states no swelling.  She has been driving and going places.  She has went to visit friends and relatives.  She states its so good not to depend on someone to take you places or do for you.  She states walking without any assistance device.  Home health is stopping next week and she is done with PT.  She states really surprised she is doing so well.  She has all her medications and verified them.  She watches what she eats.  She watches her fluids.  She spoke a lot about her brother and his condition.  Listened a lot of what it appeared she needed to talk about.  Will continue to visit for heart failure, medication compliance and diet.  Advised her to contact me when ever she feels the need.  [place] [phone]

## 2018-07-25 ENCOUNTER — Telehealth (HOSPITAL_COMMUNITY): Payer: Self-pay

## 2018-07-25 NOTE — Telephone Encounter (Signed)
Attempted to contact, no answer left message.  ° °Nakyra Bourn °[place] EMT-Paramedic °[phone] °

## 2018-08-02 ENCOUNTER — Other Ambulatory Visit (HOSPITAL_COMMUNITY): Payer: Self-pay

## 2018-08-02 NOTE — Telephone Encounter (Signed)
Family tragedy  per patient will call back when she is ready

## 2018-08-06 ENCOUNTER — Encounter (HOSPITAL_COMMUNITY): Payer: Self-pay

## 2018-08-06 NOTE — Progress Notes (Signed)
Today received ap hone call from [facility] asking for a visit.  She states her back is hurting but denies chest pain, shortness of breath or swelling.  Went out to visit, vitals as stable.  She advised she washed her tub out yesterday and while cleaning her back started hurting her, she is complaining of same pain she has had before.  She states not quite as bad today as yesterday.  She took some tylenol yesterday but not today.  Advised her to take tylenol for pain and ice compress.  If no better in a couple days to see her PCP.  She has no numbness or tingleness in extremities.  She has no swelling, lungs are clear.  BGL 178.  Meds verified and she has been taking them all.  She is able to still get around and the back pain is not limiting her at this time.  Will continue to visit for heart failure.   [place] [phone]

## 2018-08-08 ENCOUNTER — Ambulatory Visit: Payer: Medicare Other | Admitting: Urology

## 2018-08-17 ENCOUNTER — Telehealth (HOSPITAL_COMMUNITY): Payer: Self-pay | Admitting: Licensed Clinical Social Worker

## 2018-08-17 NOTE — Telephone Encounter (Signed)
Pt enrolled in Moms Meals 3 month study through [facility]- patient's 3 month period has now ended so CSW spoke with pt to complete an exit interview regarding patient's experience:  1. Since being on Moms Meals have you noticed any changes in your health or the way you feel physically? Has felt more healthy- started getting meals Weight loss? Gained 10lbs Improvement in BP? No improvement noted- stated her BP has been low  2. What do you like about the program? Liked the way the food tasted and really appreciated not having to go to the grocery store and put herself at risk. What would you like to change/add? Would prefer for the shipments to come less often as it was a little too much food.  3. How did you get food prior to [place]? a. Did you get food stamps? $16/month b. Did someone help financially? no c. Did you access food banks? no d. Do you anticipate any issues transitioning back to getting your own food after this program ends? no  4. Would you be interested in future food pilots? yes  5. Would you like a dietary consult? no  6. Do you have internet access? Tablet/Smart Phone? Smart phone   Jorge Ny, LCSW Clinical Social Worker [facility] Desk#: [phone] Cell#: [phone]

## 2018-08-20 ENCOUNTER — Telehealth: Payer: Self-pay | Admitting: Urology

## 2018-08-20 NOTE — Telephone Encounter (Signed)
Pt called and made a follow up appt with Dr Diamantina Providence for 09/10/2018, she asked if she could have pain meds called in before her upcoming appt. Please advise.

## 2018-08-21 NOTE — Telephone Encounter (Signed)
No pain meds for her, she is a former stone patient but stones have been treated, she is just a follow up for stone surveillance.  If she thinks she is having another stone should get KUB sooner to eval for any new ureteral stone  Nickolas Madrid, MD 08/21/2018

## 2018-08-21 NOTE — Telephone Encounter (Signed)
Left pt mess to call 

## 2018-08-21 NOTE — Telephone Encounter (Signed)
Ok to fill 

## 2018-08-27 NOTE — Telephone Encounter (Signed)
Called pt informed her of the information below. Pt gave verbal understanding. She states that she made an appt for 09/10/2018, and is able to manage pain currently with heating pad

## 2018-09-04 ENCOUNTER — Telehealth (HOSPITAL_COMMUNITY): Payer: Self-pay

## 2018-09-10 ENCOUNTER — Other Ambulatory Visit: Payer: Self-pay

## 2018-09-10 ENCOUNTER — Encounter: Payer: Self-pay | Admitting: Urology

## 2018-09-10 ENCOUNTER — Ambulatory Visit
Admission: RE | Admit: 2018-09-10 | Discharge: 2018-09-10 | Disposition: A | Discharge status: Home / Self Care | Payer: Medicare Other | Source: Ambulatory Visit | Attending: Urology | Admitting: Urology

## 2018-09-10 ENCOUNTER — Ambulatory Visit (INDEPENDENT_AMBULATORY_CARE_PROVIDER_SITE_OTHER): Payer: Medicare Other | Admitting: Urology

## 2018-09-10 VITALS — BP 130/74 | HR 60 | Ht 62.0 in | Wt 176.0 lb

## 2018-09-10 DIAGNOSIS — Z87442 Personal history of urinary calculi: Secondary | ICD-10-CM

## 2018-09-10 DIAGNOSIS — N200 Calculus of kidney: Secondary | ICD-10-CM

## 2018-09-10 DIAGNOSIS — N201 Calculus of ureter: Secondary | ICD-10-CM

## 2018-09-10 NOTE — Progress Notes (Signed)
   09/10/2018 1:29 PM   [name] Oct 08, 1945 [id number]  Reason for visit: Follow up nephrolithiasis  HPI: I saw Ms. Angelo back in urology clinic for follow-up of stones.  She is a 73 year old very co-morbid female who has an extensive history of nephrolithiasis.  Her last stone event was March 2020 and she required right ureteroscopy, laser lithotripsy, and stent for a 1.3 cm right distal ureteral stone.  Since that time she has not had any stone events, however was hospitalized for an exacerbation of congestive heart failure.  She reports some left-sided hip pain and low back pain across both sides.  She denies any fevers or chills.  I personally reviewed her KUB today and there do not appear to be any ureteral stones.  The 4 mm left lower pole stone appears stable.  We discussed general stone prevention strategies including adequate hydration with goal of producing 2.5 L of urine daily, increasing citric acid intake, increasing calcium intake during high oxalate meals, minimizing animal protein, and decreasing salt intake. Information about dietary recommendations given today.   RTC 1 year with KUB, sooner if any acute symptoms  A total of 15 minutes were spent face-to-face with the patient, greater than 50% was spent in patient education, counseling, and coordination of care regarding nephrolithiasis and prevention.   [place], [facility] [address], [place], [place] [address] [phone]

## 2018-09-11 NOTE — Telephone Encounter (Signed)
Had a telephone appt with Kristina Archer today.  She states she has a kidney stone that she has appt with the doctor to have surgery on.  As far as her heart, she has been good.  No swelling, no chest pain, shortness of breath or dizziness.  She states she is staying active, still driving and going places.  She using precautions when go out due to the virus.  She has all her medications and they were verified.  She is watching her diet, she misses the Moms meals and the simplicity of it.  She watches her fluid.  She is weighing daily and weight has been staying the same.  Will continue to visit for heart failure.   [place] [phone]

## 2018-09-25 ENCOUNTER — Other Ambulatory Visit: Payer: Self-pay | Admitting: Infectious Diseases

## 2018-09-25 DIAGNOSIS — Z1231 Encounter for screening mammogram for malignant neoplasm of breast: Secondary | ICD-10-CM

## 2018-10-02 ENCOUNTER — Telehealth (HOSPITAL_COMMUNITY): Payer: Self-pay

## 2018-10-02 NOTE — Telephone Encounter (Signed)
Had a telephone visit with Kristina Archer today.  She has been doing well.  She gets her medications by bubble packs.  She knows how to take them.  She went through the [employer] program, she watches her diet and fluids.  She has had no swelling, chest pain, shortness of breath, headaches or dizziness.  She is driving again and going shopping.  Explained to her about discharging her from the program and advised her if any problems keep my number and I can add her back.  She appears to understand.  [name] with [facility] is aware and agrees.    [place] [phone]

## 2018-12-28 ENCOUNTER — Telehealth: Payer: Self-pay | Admitting: Cardiovascular Disease

## 2018-12-28 NOTE — Telephone Encounter (Signed)
Virtual Visit Pre-Appointment Phone Call  "(Name), I am calling you today to discuss your upcoming appointment. We are currently trying to limit exposure to the virus that causes COVID-19 by seeing patients at home rather than in the office."  1. "What is the BEST phone number to call the day of the visit?" - include this in appointment notes  2. "Do you have or have access to (through a family member/friend) a smartphone with video capability that we can use for your visit?" a. If yes - list this number in appt notes as "cell" (if different from BEST phone #) and list the appointment type as a VIDEO visit in appointment notes b. If no - list the appointment type as a PHONE visit in appointment notes  3. Confirm consent - "In the setting of the current Covid19 crisis, you are scheduled for a (phone or video) visit with your provider on (date) at (time).  Just as we do with many in-office visits, in order for you to participate in this visit, we must obtain consent.  If you'd like, I can send this to your mychart (if signed up) or email for you to review.  Otherwise, I can obtain your verbal consent now.  All virtual visits are billed to your insurance company just like a normal visit would be.  By agreeing to a virtual visit, we'd like you to understand that the technology does not allow for your provider to perform an examination, and thus may limit your provider's ability to fully assess your condition. If your provider identifies any concerns that need to be evaluated in person, we will make arrangements to do so.  Finally, though the technology is pretty good, we cannot assure that it will always work on either your or our end, and in the setting of a video visit, we may have to convert it to a phone-only visit.  In either situation, we cannot ensure that we have a secure connection.  Are you willing to proceed?" STAFF: Did the patient verbally acknowledge consent to telehealth visit? Document  YES/NO here: YES  4. Advise patient to be prepared - "Two hours prior to your appointment, go ahead and check your blood pressure, pulse, oxygen saturation, and your weight (if you have the equipment to check those) and write them all down. When your visit starts, your provider will ask you for this information. If you have an Apple Watch or Kardia device, please plan to have heart rate information ready on the day of your appointment. Please have a pen and paper handy nearby the day of the visit as well."  5. Give patient instructions for MyChart download to smartphone OR Doximity/Doxy.me as below if video visit (depending on what platform provider is using)  6. Inform patient they will receive a phone call 15 minutes prior to their appointment time (may be from unknown caller ID) so they should be prepared to answer    TELEPHONE CALL NOTE  AKASIA AHMAD has been deemed a candidate for a follow-up tele-health visit to limit community exposure during the Covid-19 pandemic. I spoke with the patient via phone to ensure availability of phone/video source, confirm preferred email & phone number, and discuss instructions and expectations.  I reminded [name] to be prepared with any vital sign and/or heart rhythm information that could potentially be obtained via home monitoring, at the time of her visit. I reminded [name] to expect a phone call prior to  her visit.  Joline Maxcy 12/28/2018 2:47 PM   INSTRUCTIONS FOR DOWNLOADING THE MYCHART APP TO SMARTPHONE  - The patient must first make sure to have activated MyChart and know their login information - If Apple, go to [employer] and type in MyChart in the search bar and download the app. If Android, ask patient to go to [employer] and type in [place] in the search bar and download the app. The app is free but as with any other app downloads, their phone may require them to verify saved payment information or Apple/Android  password.  - The patient will need to then log into the app with their MyChart username and password, and select [facility] as their healthcare provider to link the account. When it is time for your visit, go to the MyChart app, find appointments, and click Begin Video Visit. Be sure to Select Allow for your device to access the Microphone and Camera for your visit. You will then be connected, and your provider will be with you shortly.  **If they have any issues connecting, or need assistance please contact MyChart service desk [phone])**  **If using a computer, in order to ensure the best quality for their visit they will need to use either of the following Internet Browsers: [employer] or Google Chrome**  IF USING DOXIMITY or DOXY.ME - The patient will receive a link just prior to their visit by text.     FULL LENGTH CONSENT FOR TELE-HEALTH VISIT   I hereby voluntarily request, consent and authorize [facility] and its employed or contracted physicians, physician assistants, nurse practitioners or other licensed health care professionals (the Practitioner), to provide me with telemedicine health care services (the "Services") as deemed necessary by the treating Practitioner. I acknowledge and consent to receive the Services by the Practitioner via telemedicine. I understand that the telemedicine visit will involve communicating with the Practitioner through live audiovisual communication technology and the disclosure of certain medical information by electronic transmission. I acknowledge that I have been given the opportunity to request an in-person assessment or other available alternative prior to the telemedicine visit and am voluntarily participating in the telemedicine visit.  I understand that I have the right to withhold or withdraw my consent to the use of telemedicine in the course of my care at any time, without affecting my right to future care or treatment,  and that the Practitioner or I may terminate the telemedicine visit at any time. I understand that I have the right to inspect all information obtained and/or recorded in the course of the telemedicine visit and may receive copies of available information for a reasonable fee.  I understand that some of the potential risks of receiving the Services via telemedicine include:  Marland Kitchen Delay or interruption in medical evaluation due to technological equipment failure or disruption; . Information transmitted may not be sufficient (e.g. poor resolution of images) to allow for appropriate medical decision making by the Practitioner; and/or  . In rare instances, security protocols could fail, causing a breach of personal health information.  Furthermore, I acknowledge that it is my responsibility to provide information about my medical history, conditions and care that is complete and accurate to the best of my ability. I acknowledge that Practitioner's advice, recommendations, and/or decision may be based on factors not within their control, such as incomplete or inaccurate data provided by me or distortions of diagnostic images or specimens that may result from electronic transmissions. I  understand that the practice of medicine is not an exact science and that Practitioner makes no warranties or guarantees regarding treatment outcomes. I acknowledge that I will receive a copy of this consent concurrently upon execution via email to the email address I last provided but may also request a printed copy by calling the office of [facility].    I understand that my insurance will be billed for this visit.   I have read or had this consent read to me. . I understand the contents of this consent, which adequately explains the benefits and risks of the Services being provided via telemedicine.  . I have been provided ample opportunity to ask questions regarding this consent and the Services and have had my questions  answered to my satisfaction. . I give my informed consent for the services to be provided through the use of telemedicine in my medical care  By participating in this telemedicine visit I agree to the above.

## 2018-12-28 NOTE — Telephone Encounter (Signed)
Patient declined to schedule in office .

## 2019-01-30 ENCOUNTER — Other Ambulatory Visit: Payer: Self-pay

## 2019-01-30 ENCOUNTER — Telehealth: Payer: Medicare Other | Admitting: Cardiovascular Disease

## 2019-01-31 ENCOUNTER — Encounter: Payer: Self-pay | Admitting: Cardiovascular Disease

## 2019-02-07 ENCOUNTER — Other Ambulatory Visit: Payer: Self-pay | Admitting: Physical Medicine and Rehabilitation

## 2019-02-07 DIAGNOSIS — M5416 Radiculopathy, lumbar region: Secondary | ICD-10-CM

## 2019-02-18 ENCOUNTER — Ambulatory Visit
Admission: RE | Admit: 2019-02-18 | Discharge: 2019-02-18 | Disposition: A | Discharge status: Home / Self Care | Payer: Medicare Other | Source: Ambulatory Visit | Attending: Physical Medicine and Rehabilitation | Admitting: Physical Medicine and Rehabilitation

## 2019-02-18 ENCOUNTER — Other Ambulatory Visit: Payer: Self-pay

## 2019-02-18 DIAGNOSIS — M5416 Radiculopathy, lumbar region: Secondary | ICD-10-CM

## 2019-05-01 ENCOUNTER — Emergency Department
Admission: EM | Admit: 2019-05-01 | Discharge: 2019-05-01 | Disposition: A | Discharge status: Home / Self Care | Payer: Medicare Other | Attending: Student in an Organized Health Care Education/Training Program | Admitting: Student in an Organized Health Care Education/Training Program

## 2019-05-01 ENCOUNTER — Emergency Department: Payer: Medicare Other

## 2019-05-01 ENCOUNTER — Other Ambulatory Visit: Payer: Self-pay

## 2019-05-01 DIAGNOSIS — W19XXXA Unspecified fall, initial encounter: Secondary | ICD-10-CM

## 2019-05-01 DIAGNOSIS — Z79899 Other long term (current) drug therapy: Secondary | ICD-10-CM | POA: Diagnosis not present

## 2019-05-01 DIAGNOSIS — Z7901 Long term (current) use of anticoagulants: Secondary | ICD-10-CM | POA: Diagnosis not present

## 2019-05-01 DIAGNOSIS — Z7984 Long term (current) use of oral hypoglycemic drugs: Secondary | ICD-10-CM | POA: Diagnosis not present

## 2019-05-01 DIAGNOSIS — I110 Hypertensive heart disease with heart failure: Secondary | ICD-10-CM | POA: Insufficient documentation

## 2019-05-01 DIAGNOSIS — W010XXA Fall on same level from slipping, tripping and stumbling without subsequent striking against object, initial encounter: Secondary | ICD-10-CM | POA: Diagnosis not present

## 2019-05-01 DIAGNOSIS — Y92513 Shop (commercial) as the place of occurrence of the external cause: Secondary | ICD-10-CM | POA: Diagnosis not present

## 2019-05-01 DIAGNOSIS — E119 Type 2 diabetes mellitus without complications: Secondary | ICD-10-CM | POA: Insufficient documentation

## 2019-05-01 DIAGNOSIS — Y939 Activity, unspecified: Secondary | ICD-10-CM | POA: Diagnosis not present

## 2019-05-01 DIAGNOSIS — I5022 Chronic systolic (congestive) heart failure: Secondary | ICD-10-CM | POA: Insufficient documentation

## 2019-05-01 DIAGNOSIS — Z87891 Personal history of nicotine dependence: Secondary | ICD-10-CM | POA: Diagnosis not present

## 2019-05-01 DIAGNOSIS — S4991XA Unspecified injury of right shoulder and upper arm, initial encounter: Secondary | ICD-10-CM | POA: Diagnosis present

## 2019-05-01 DIAGNOSIS — S42211A Unspecified displaced fracture of surgical neck of right humerus, initial encounter for closed fracture: Secondary | ICD-10-CM | POA: Insufficient documentation

## 2019-05-01 DIAGNOSIS — Y999 Unspecified external cause status: Secondary | ICD-10-CM | POA: Insufficient documentation

## 2019-05-01 MED ORDER — OXYCODONE-ACETAMINOPHEN 5-325 MG PO TABS: 1.0000 | ORAL_TABLET | Freq: Four times a day (QID) | ORAL | 0 refills | Status: AC | PRN

## 2019-05-01 MED ORDER — OXYCODONE-ACETAMINOPHEN 5-325 MG PO TABS
1.0000 | ORAL_TABLET | Freq: Once | ORAL | Status: AC
  Administered 2019-05-01: 1 via ORAL
  Filled 2019-05-01: qty 1

## 2019-05-01 NOTE — ED Notes (Signed)
Computer not working, unable to obtain signature on pad

## 2019-05-01 NOTE — ED Provider Notes (Signed)
[facility] Emergency Department Provider Note ____________________________________________  Time seen: 1820  I have reviewed the triage vital signs and the nursing notes.  HISTORY  Chief Complaint  Fall  HPI Kristina Archer is a 74 y.o. right-handed female presents to the ED for evaluation of injury sustained following a mechanical fall.   Patient presents via EMS from a local department store.  She tripped and fell and injured her right arm.  She presents after having received 50 mcg of fentanyl via EMS prior to arrival patient denies any head injury or loss of consciousness.  She reports right shoulder pain and disability with range of motion.  Past Medical History:  Diagnosis Date  . Anxiety   . Cardiomyopathy ([place])    a. 10/2017 Echo: EF 20-25%, diff HK, mild MR. Nl RV size.  . DDD (degenerative disc disease), lumbar   . Diverticulosis   . Elevated troponin   . Essential hypertension   . GERD (gastroesophageal reflux disease)   . History of kidney stones   . Hyperlipidemia   . LBBB (left bundle branch block)   . PAF (paroxysmal atrial fibrillation) ([place])    a.  Diagnosed 10/2017; b.  On Eliquis and amiodarone; c. CHADS2VASc => 5 (CHF, HTN, age x 1, DM, female)  . Pneumonia   . Sciatica   . Type II diabetes mellitus [facility])     Patient Active Problem List   Diagnosis Date Noted  . Acute on chronic congestive heart failure ([place])   . Acute renal insufficiency   . Acute respiratory failure with hypoxia ([place]) 03/05/2018  . HLD (hyperlipidemia) 03/02/2018  . Paroxysmal atrial fibrillation ([place]) 03/02/2018  . GERD (gastroesophageal reflux disease) 03/02/2018  . Anxiety 03/02/2018  . Urinary tract infection with hematuria   . Pneumonia of both lower lobes due to infectious organism   . Sepsis ([place]) 12/18/2017  . Acute on chronic systolic heart failure ([place])   . Ureterolithiasis 11/02/2017    Past Surgical History:  Procedure Laterality Date  .  ABDOMINAL HYSTERECTOMY    . CHOLECYSTECTOMY    . CYSTOSCOPY W/ RETROGRADES Bilateral 05/24/2017   Procedure: CYSTOSCOPY WITH RETROGRADE PYELOGRAM;  Surgeon: Hollice Espy, MD;  Location: [facility] ORS;  Service: Urology;  Laterality: Bilateral;  . CYSTOSCOPY W/ RETROGRADES Right 01/29/2018   Procedure: CYSTOSCOPY WITH RETROGRADE PYELOGRAM;  Surgeon: Hollice Espy, MD;  Location: [facility] ORS;  Service: Urology;  Laterality: Right;  . CYSTOSCOPY W/ RETROGRADES Right 03/02/2018   Procedure: CYSTOSCOPY WITH RETROGRADE PYELOGRAM;  Surgeon: Billey Co, MD;  Location: [facility] ORS;  Service: Urology;  Laterality: Right;  . CYSTOSCOPY W/ RETROGRADES Right 03/30/2018   Procedure: CYSTOSCOPY WITH RETROGRADE PYELOGRAM;  Surgeon: Billey Co, MD;  Location: [facility] ORS;  Service: Urology;  Laterality: Right;  . CYSTOSCOPY W/ URETERAL STENT PLACEMENT Right 11/02/2017   Procedure: CYSTOSCOPY WITH RETROGRADE PYELOGRAM/URETERAL STENT PLACEMENT;  Surgeon: Lucas Mallow, MD;  Location: [facility] ORS;  Service: Urology;  Laterality: Right;  . CYSTOSCOPY WITH STENT PLACEMENT Right 03/02/2018   Procedure: CYSTOSCOPY WITH STENT PLACEMENT-RIGHT;  Surgeon: Billey Co, MD;  Location: [facility] ORS;  Service: Urology;  Laterality: Right;  . CYSTOSCOPY/URETEROSCOPY/HOLMIUM LASER/STENT PLACEMENT Left 05/24/2017   Procedure: CYSTOSCOPY/URETEROSCOPY/HOLMIUM LASER/STENT PLACEMENT;  Surgeon: Hollice Espy, MD;  Location: [facility] OR[facility];  Service: Urology;  Laterality: Left;  . CYSTOSCOPY/URETEROSCOPY/HOLMIUM LASER/STENT PLACEMENT Right 01/29/2018   Procedure: CYSTOSCOPY/URETEROSCOPY/HOLMIUM LASER/STENT Exchange;  Surgeon: Hollice Espy, MD;  Location: [facility] ORS;  Service: Urology;  Laterality: Right;  .  CYSTOSCOPY/URETEROSCOPY/HOLMIUM LASER/STENT PLACEMENT Right 03/30/2018   Procedure: CYSTOSCOPY/URETEROSCOPY/HOLMIUM LASER/STENT EXCHANGE;  Surgeon: Sondra Come, MD;  Location: [facility] ORS;  Service: Urology;  Laterality: Right;     Prior to Admission medications   Medication Sig Start Date End Date Taking? Authorizing Provider  amiodarone (PACERONE) 200 MG tablet Take 1 tablet (200 mg total) by mouth daily. 01/09/18   Sondra Barges, PA-C  apixaban (ELIQUIS) 5 MG TABS tablet Take 1 tablet (5 mg total) by mouth 2 (two) times daily. Patient taking differently: Take 5 mg by mouth 2 (two) times daily.  11/13/17   Katha Hamming, MD  carvedilol (COREG) 3.125 MG tablet Take 1 tablet (3.125 mg total) by mouth 2 (two) times daily with a meal. 11/13/17   Katha Hamming, MD  furosemide (LASIX) 20 MG tablet Take 1 tablet (20 mg total) by mouth daily. 05/30/18   Iran Ouch, MD  oxyCODONE-acetaminophen (PERCOCET) 5-325 MG tablet Take 1 tablet by mouth every 6 (six) hours as needed for up to 5 days for severe pain. 05/01/19 05/06/19  Rayshard Schirtzinger, Charlesetta Ivory, PA-C  PARoxetine (PAXIL) 20 MG tablet Take 20 mg by mouth at bedtime.    [provider]  rosuvastatin (CRESTOR) 40 MG tablet Take 1 tablet (40 mg total) by mouth daily. 11/13/17   Katha Hamming, MD  sacubitril-valsartan (ENTRESTO) 49-51 MG Take 1 tablet by mouth 2 (two) times daily. 05/10/18   Milagros Loll, MD  sitaGLIPtin (JANUVIA) 100 MG tablet Take 100 mg by mouth daily.    [provider]  spironolactone (ALDACTONE) 25 MG tablet Take 1 tablet (25 mg total) by mouth daily. 05/11/18   Milagros Loll, MD  traZODone (DESYREL) 50 MG tablet Take 50 mg by mouth at bedtime.  11/30/17   [provider]    Allergies Codeine  Family History  Problem Relation Age of Onset  . Heart attack Father 76  . Bladder Cancer Neg Hx   . Kidney cancer Neg Hx     Social History Social History   Tobacco Use  . Smoking status: Former Smoker    Packs/day: 1.50    Years: 15.00    Pack years: 22.50    Types: Cigarettes    Quit date: 05/12/1997    Years since quitting: 21.9  . Smokeless tobacco: Never Used  Substance Use Topics  . Alcohol use:  Not Currently  . Drug use: Never    Review of Systems  Constitutional: Negative for fever. Eyes: Negative for visual changes. ENT: Negative for sore throat. Cardiovascular: Negative for chest pain. Respiratory: Negative for shortness of breath. Gastrointestinal: Negative for abdominal pain, vomiting and diarrhea. Genitourinary: Negative for dysuria. Musculoskeletal: Negative for back pain.  Right shoulder pain and disability as above. Skin: Negative for rash. Neurological: Negative for headaches, focal weakness or numbness. ____________________________________________  PHYSICAL EXAM:  VITAL SIGNS: ED Triage Vitals  Enc Vitals Group     BP 05/01/19 1559 (!) 148/75     Pulse Rate 05/01/19 1559 (!) 59     Resp 05/01/19 1559 16     Temp 05/01/19 1559 97.7 F (36.5 C)     Temp Source 05/01/19 1559 Oral     SpO2 05/01/19 1559 95 %     Weight 05/01/19 1557 198 lb (89.8 kg)     Height 05/01/19 1557 5\' 3"  (1.6 m)     Head Circumference --      Peak Flow --      Pain Score 05/01/19 1557 8  Pain Loc --      Pain Edu? --      Excl. in GC? --     Constitutional: Alert and oriented. Well appearing and in no distress. Head: Normocephalic and atraumatic. Eyes: Conjunctivae are normal. Normal extraocular movements Neck: Supple.  Normal range of motion without crepitus. Cardiovascular: Normal rate, regular rhythm. Normal distal pulses. Respiratory: Normal respiratory effort. No wheezes/rales/rhonchi. Gastrointestinal: Soft and nontender. No distention. Musculoskeletal: Right upper arm without obvious deformity or dislocation.  Patient holds her arm in a flexed abducted position.  She is tender to palpation over the right humeral head.  Normal composite fist distally.  Nontender with normal range of motion in all extremities.  Neurologic:  Normal gross sensation.  Normal speech and language. No gross focal neurologic deficits are appreciated. Skin:  Skin is warm, dry and intact. No  rash noted. ____________________________________________   RADIOLOGY  DG Right  IMPRESSION: 1. Comminuted impacted right humeral neck fracture. ____________________________________________  PROCEDURES  Percocet 5-325 mg PO Sling immobilizer  Procedures ____________________________________________  INITIAL IMPRESSION / ASSESSMENT AND PLAN / ED COURSE  S/W Dr. Allena Katz (ortho): he agrees with sling immobilizer and he will follow with the patient in the office next week.  Patient with ED evaluation and initial fracture care of a closed right humeral neck fracture following a mechanical fall.  Patient is placed in a sling immobilizer and discharged with oxycodone for pain relief.  She will follow-up with Ortho as directed peer return precautions have been reviewed.  Kristina Archer was evaluated in Emergency Department on 05/01/2019 for the symptoms described in the history of present illness. She was evaluated in the context of the global COVID-19 pandemic, which necessitated consideration that the patient might be at risk for infection with the SARS-CoV-2 virus that causes COVID-19. Institutional protocols and algorithms that pertain to the evaluation of patients at risk for COVID-19 are in a state of rapid change based on information released by regulatory bodies including the CDC and federal and state organizations. These policies and algorithms were followed during the patient's care in the ED.  I reviewed the patient's prescription history over the last 12 months in the multi-state controlled substances database(s) that includes [place], [place], [place], [place], [place], [place], [place], [place], [place], [place], [place], [place], [place], and [place].  Results were notable for no current RX. ____________________________________________  FINAL CLINICAL IMPRESSION(S) / ED DIAGNOSES  Final diagnoses:  Fall, initial encounter  Fx humeral neck,  right, closed, initial encounter      Karmen Stabs, Charlesetta Ivory, PA-C 05/01/19 2352    Willy Eddy, MD 05/02/19 0001

## 2019-05-01 NOTE — Discharge Instructions (Signed)
You have a fracture (break) to the upper neck of your humerus bone. You are being placed in a sling for comfort and support. Take the pain medicine as needed. Apply ice to reduce pain and swelling. Follow-up with Dr. Allena Katz as discussed. Return to the ED as needed.

## 2019-05-01 NOTE — ED Triage Notes (Signed)
Pt comes into the ED via EMS from a department store, states she tripped and fell and injured her right upper arm. Fentanyl given PTA, #[id number].. pt is a/ox4

## 2019-05-06 ENCOUNTER — Telehealth: Payer: Self-pay | Admitting: Emergency Medicine

## 2019-06-22 ENCOUNTER — Other Ambulatory Visit: Payer: Self-pay | Admitting: Cardiovascular Disease

## 2019-07-15 ENCOUNTER — Other Ambulatory Visit: Payer: Self-pay | Admitting: Internal Medicine

## 2019-07-15 ENCOUNTER — Other Ambulatory Visit: Payer: Self-pay | Admitting: Cardiovascular Disease

## 2019-07-17 NOTE — Telephone Encounter (Signed)
Attempted to schedule.  LMOV to call office.  ° °

## 2019-07-17 NOTE — Telephone Encounter (Signed)
Pt overdue for 1 month f/u last seen 12/2017. Please contact pt for future appointment.

## 2019-07-25 NOTE — Telephone Encounter (Signed)
Patient refused to schedule at this time

## 2019-07-31 ENCOUNTER — Other Ambulatory Visit: Payer: Self-pay | Admitting: Internal Medicine

## 2019-08-07 ENCOUNTER — Other Ambulatory Visit: Payer: Self-pay | Admitting: Internal Medicine

## 2019-08-22 ENCOUNTER — Other Ambulatory Visit: Payer: Self-pay

## 2019-08-22 ENCOUNTER — Encounter (INDEPENDENT_AMBULATORY_CARE_PROVIDER_SITE_OTHER): Payer: Self-pay

## 2019-08-22 ENCOUNTER — Inpatient Hospital Stay: Payer: Medicare Other

## 2019-08-22 ENCOUNTER — Inpatient Hospital Stay: Payer: Medicare Other | Attending: Oncology | Admitting: Oncology

## 2019-08-22 ENCOUNTER — Encounter: Payer: Self-pay | Admitting: Oncology

## 2019-08-22 VITALS — BP 120/53 | HR 76 | Temp 96.8°F | Resp 18 | Wt 207.2 lb

## 2019-08-22 DIAGNOSIS — Z7901 Long term (current) use of anticoagulants: Secondary | ICD-10-CM | POA: Diagnosis not present

## 2019-08-22 DIAGNOSIS — Z79899 Other long term (current) drug therapy: Secondary | ICD-10-CM | POA: Diagnosis not present

## 2019-08-22 DIAGNOSIS — I509 Heart failure, unspecified: Secondary | ICD-10-CM | POA: Insufficient documentation

## 2019-08-22 DIAGNOSIS — E785 Hyperlipidemia, unspecified: Secondary | ICD-10-CM | POA: Diagnosis not present

## 2019-08-22 DIAGNOSIS — F419 Anxiety disorder, unspecified: Secondary | ICD-10-CM | POA: Insufficient documentation

## 2019-08-22 DIAGNOSIS — I4891 Unspecified atrial fibrillation: Secondary | ICD-10-CM | POA: Insufficient documentation

## 2019-08-22 DIAGNOSIS — K219 Gastro-esophageal reflux disease without esophagitis: Secondary | ICD-10-CM | POA: Diagnosis not present

## 2019-08-22 DIAGNOSIS — D696 Thrombocytopenia, unspecified: Secondary | ICD-10-CM

## 2019-08-22 DIAGNOSIS — E119 Type 2 diabetes mellitus without complications: Secondary | ICD-10-CM | POA: Diagnosis not present

## 2019-08-22 DIAGNOSIS — I480 Paroxysmal atrial fibrillation: Secondary | ICD-10-CM | POA: Insufficient documentation

## 2019-08-22 DIAGNOSIS — I110 Hypertensive heart disease with heart failure: Secondary | ICD-10-CM | POA: Insufficient documentation

## 2019-08-22 DIAGNOSIS — D649 Anemia, unspecified: Secondary | ICD-10-CM | POA: Diagnosis not present

## 2019-08-22 DIAGNOSIS — Z87891 Personal history of nicotine dependence: Secondary | ICD-10-CM | POA: Insufficient documentation

## 2019-08-22 LAB — CBC WITH DIFFERENTIAL/PLATELET
Abs Immature Granulocytes: 0.03 10*3/uL (ref 0.00–0.07)
Basophils Absolute: 0.1 10*3/uL (ref 0.0–0.1)
Basophils Relative: 1 %
Eosinophils Absolute: 0.1 10*3/uL (ref 0.0–0.5)
Eosinophils Relative: 2 %
HCT: 40.7 % (ref 36.0–46.0)
Hemoglobin: 14.3 g/dL (ref 12.0–15.0)
Immature Granulocytes: 1 %
Lymphocytes Relative: 37 %
Lymphs Abs: 2.2 10*3/uL (ref 0.7–4.0)
MCH: 30.4 pg (ref 26.0–34.0)
MCHC: 35.1 g/dL (ref 30.0–36.0)
MCV: 86.4 fL (ref 80.0–100.0)
Monocytes Absolute: 0.5 10*3/uL (ref 0.1–1.0)
Monocytes Relative: 8 %
Neutro Abs: 3.1 10*3/uL (ref 1.7–7.7)
Neutrophils Relative %: 51 %
Platelets: 125 10*3/uL — ABNORMAL LOW (ref 150–400)
RBC: 4.71 MIL/uL (ref 3.87–5.11)
RDW: 13.4 % (ref 11.5–15.5)
WBC: 6.1 10*3/uL (ref 4.0–10.5)
nRBC: 0 % (ref 0.0–0.2)

## 2019-08-22 LAB — VITAMIN B12: Vitamin B-12: 444 pg/mL (ref 180–914)

## 2019-08-22 LAB — TECHNOLOGIST SMEAR REVIEW
Plt Morphology: NORMAL
RBC Morphology: NORMAL

## 2019-08-22 LAB — HIV ANTIBODY (ROUTINE TESTING W REFLEX): HIV Screen 4th Generation wRfx: NONREACTIVE

## 2019-08-22 LAB — HEPATITIS C ANTIBODY: HCV Ab: NONREACTIVE

## 2019-08-22 LAB — FOLATE: Folate: 21 ng/mL (ref >5.9)

## 2019-08-23 NOTE — Progress Notes (Signed)
Hematology/Oncology Consult note [facility] Telephone:([phone] [phone]  Patient Care Team: Mick Sell, MD as PCP - General (Infectious Diseases) Antonieta Iba, MD as PCP - Cardiology (Cardiology)   Name of the patient: Kristina Archer  [id number]  [birth year]    Reason for referral-thrombocytopenia   Referring physician-Dr. Sampson Goon  Date of visit: 08/23/19   History of presenting illness-patient is a 74 year old female with a past medical history significant for congestive heart failure, paroxysmal A. fib, GERD among other medical problems. She has been referred to me for thrombocytopenia.  Looking back at her prior CBCs patient has had a chronically low platelet count which fluctuates between 88-1 sixty.  White count and hemoglobin have been normal.  Most recent CBC from 07/17/2019 showed a white count of 5.5, H&H of 14.5/42.8 and a platelet count of 116.  CMP showed mildly elevated AST and ALT of fifty and fifty-seven respectively within normal albumin and total bilirubin.  Patient does not have any known chronic liver disease.  She denies any changes in her medications recently.  Denies any over-the-counter medications or herbal supplements.  Patient denies any easy bleeding or bruising.  Denies any gum bleeds, nosebleeds, vaginal or GI bleeding.  ECOG PS- 1  Pain scale- 0   Review of systems- Review of Systems  Constitutional: Positive for malaise/fatigue. Negative for chills, fever and weight loss.  HENT: Negative for congestion, ear discharge and nosebleeds.   Eyes: Negative for blurred vision.  Respiratory: Negative for cough, hemoptysis, sputum production, shortness of breath and wheezing.   Cardiovascular: Negative for chest pain, palpitations, orthopnea and claudication.  Gastrointestinal: Negative for abdominal pain, blood in stool, constipation, diarrhea, heartburn, melena, nausea and vomiting.  Genitourinary: Negative  for dysuria, flank pain, frequency, hematuria and urgency.  Musculoskeletal: Negative for back pain, joint pain and myalgias.  Skin: Negative for rash.  Neurological: Negative for dizziness, tingling, focal weakness, seizures, weakness and headaches.  Endo/Heme/Allergies: Does not bruise/bleed easily.  Psychiatric/Behavioral: Negative for depression and suicidal ideas. The patient does not have insomnia.     Allergies  Allergen Reactions   Codeine Nausea Only    Patient Active Problem List   Diagnosis Date Noted   Acute on chronic congestive heart failure (HCC)    Acute renal insufficiency    Acute respiratory failure with hypoxia (HCC) 03/05/2018   HLD (hyperlipidemia) 03/02/2018   Paroxysmal atrial fibrillation (HCC) 03/02/2018   GERD (gastroesophageal reflux disease) 03/02/2018   Anxiety 03/02/2018   Urinary tract infection with hematuria    Pneumonia of both lower lobes due to infectious organism    Sepsis (HCC) 12/18/2017   Acute on chronic systolic heart failure (HCC)    Ureterolithiasis 11/02/2017     Past Medical History:  Diagnosis Date   Anxiety    Cardiomyopathy (HCC)    a. 10/2017 Echo: EF 20-25%, diff HK, mild MR. Nl RV size.   DDD (degenerative disc disease), lumbar    Diverticulosis    Elevated troponin    Essential hypertension    GERD (gastroesophageal reflux disease)    History of kidney stones    Hyperlipidemia    LBBB (left bundle branch block)    PAF (paroxysmal atrial fibrillation) (HCC)    a.  Diagnosed 10/2017; b.  On Eliquis and amiodarone; c. CHADS2VASc => 5 (CHF, HTN, age x 1, DM, female)   Pneumonia    Sciatica    Type II diabetes mellitus (HCC)      Past  Surgical History:  Procedure Laterality Date   ABDOMINAL HYSTERECTOMY     CHOLECYSTECTOMY     CYSTOSCOPY W/ RETROGRADES Bilateral 05/24/2017   Procedure: CYSTOSCOPY WITH RETROGRADE PYELOGRAM;  Surgeon: [name], [name], MD;  Location: [facility];  Service:  Urology;  Laterality: Bilateral;   CYSTOSCOPY W/ RETROGRADES Right 01/29/2018   Procedure: CYSTOSCOPY WITH RETROGRADE PYELOGRAM;  Surgeon: [name], [name], MD;  Location: [facility];  Service: Urology;  Laterality: Right;   CYSTOSCOPY W/ RETROGRADES Right 03/02/2018   Procedure: CYSTOSCOPY WITH RETROGRADE PYELOGRAM;  Surgeon: Sondra ComeSninsky, Brian C, MD;  Location: [facility];  Service: Urology;  Laterality: Right;   CYSTOSCOPY W/ RETROGRADES Right 03/30/2018   Procedure: CYSTOSCOPY WITH RETROGRADE PYELOGRAM;  Surgeon: Sondra ComeSninsky, Brian C, MD;  Location: [facility];  Service: Urology;  Laterality: Right;   CYSTOSCOPY W/ URETERAL STENT PLACEMENT Right 11/02/2017   Procedure: CYSTOSCOPY WITH RETROGRADE PYELOGRAM/URETERAL STENT PLACEMENT;  Surgeon: [name], [name], MD;  Location: [facility];  Service: Urology;  Laterality: Right;   CYSTOSCOPY WITH STENT PLACEMENT Right 03/02/2018   Procedure: CYSTOSCOPY WITH STENT PLACEMENT-RIGHT;  Surgeon: Sondra ComeSninsky, Brian C, MD;  Location: [facility];  Service: Urology;  Laterality: Right;   CYSTOSCOPY/URETEROSCOPY/HOLMIUM LASER/STENT PLACEMENT Left 05/24/2017   Procedure: CYSTOSCOPY/URETEROSCOPY/HOLMIUM LASER/STENT PLACEMENT;  Surgeon: [name], [name], MD;  Location: [facility];  Service: Urology;  Laterality: Left;   CYSTOSCOPY/URETEROSCOPY/HOLMIUM LASER/STENT PLACEMENT Right 01/29/2018   Procedure: CYSTOSCOPY/URETEROSCOPY/HOLMIUM LASER/STENT Exchange;  Surgeon: [name], [name], MD;  Location: [facility];  Service: Urology;  Laterality: Right;   CYSTOSCOPY/URETEROSCOPY/HOLMIUM LASER/STENT PLACEMENT Right 03/30/2018   Procedure: CYSTOSCOPY/URETEROSCOPY/HOLMIUM LASER/STENT EXCHANGE;  Surgeon: Sondra ComeSninsky, Brian C, MD;  Location: [facility];  Service: Urology;  Laterality: Right;    Social History   Socioeconomic History   Marital status: Widowed    Spouse name: Not on file   Number of children: Not on file   Years of education: Not on file   Highest education level: Not on file    Occupational History   Not on file  Tobacco Use   Smoking status: Former Smoker    Packs/day: 1.50    Years: 15.00    Pack years: 22.50    Types: Cigarettes    Quit date: 05/12/1997    Years since quitting: 22.2   Smokeless tobacco: Never Used  Vaping Use   Vaping Use: Never used  Substance and Sexual Activity   Alcohol use: Not Currently   Drug use: Never   Sexual activity: Not Currently  Other Topics Concern   Not on file  Social History Narrative   Not on file   Social Determinants of Health   Financial Resource Strain:    Difficulty of Paying Living Expenses:   Food Insecurity:    Worried About Programme researcher, broadcasting/film/videounning Out of Food in the Last Year:    Baristaan Out of Food in the Last Year:   Transportation Needs:    Freight forwarderLack of Transportation (Medical):    Lack of Transportation (Non-Medical):   Physical Activity:    Days of Exercise per Week:    Minutes of Exercise per Session:   Stress:    Feeling of Stress :   Social Connections:    Frequency of Communication with Friends and Family:    Frequency of Social Gatherings with Friends and Family:    Attends Religious Services:    Active Member of Clubs or Organizations:    Attends BankerClub or Organization Meetings:    Marital Status:   Intimate Partner Violence:    Fear of Current  or Ex-Partner:    Emotionally Abused:    Physically Abused:    Sexually Abused:      Family History  Problem Relation Age of Onset   Heart attack Father 15   Bladder Cancer Neg Hx    Kidney cancer Neg Hx      Current Outpatient Medications:    amiodarone (PACERONE) 200 MG tablet, Take 1 tablet (200 mg total) by mouth daily., Disp: 60 tablet, Rfl: 0   apixaban (ELIQUIS) 5 MG TABS tablet, Take 1 tablet (5 mg total) by mouth 2 (two) times daily. (Patient taking differently: Take 5 mg by mouth 2 (two) times daily. ), Disp: 60 tablet, Rfl: 0   carvedilol (COREG) 3.125 MG tablet, TAKE ONE TABLET BY MOUTH TWICE DAILY, Disp: 30  tablet, Rfl: 6   furosemide (LASIX) 20 MG tablet, TAKE 1 TABLET BY MOUTH DAILY, Disp: 15 tablet, Rfl: 0   JANUVIA 100 MG tablet, TAKE ONE TABLET EVERY DAY, Disp: 30 tablet, Rfl: 6   PARoxetine (PAXIL) 20 MG tablet, Take 20 mg by mouth at bedtime., Disp: , Rfl:    rosuvastatin (CRESTOR) 40 MG tablet, Take 1 tablet (40 mg total) by mouth daily., Disp: 30 tablet, Rfl: 0   sacubitril-valsartan (ENTRESTO) 49-51 MG, Take 1 tablet by mouth 2 (two) times daily., Disp: 60 tablet, Rfl: 0   spironolactone (ALDACTONE) 25 MG tablet, TAKE ONE TABLET BY MOUTH EVERY DAY, Disp: 30 tablet, Rfl: 0   traZODone (DESYREL) 50 MG tablet, Take 50 mg by mouth at bedtime. , Disp: , Rfl: 3   Physical exam:  Vitals:   08/22/19 1324  BP: (!) 120/53  Pulse: 76  Resp: 18  Temp: (!) 96.8 F (36 C)  TempSrc: Tympanic  SpO2: 96%  Weight: 207 lb 3.2 oz (94 kg)   Physical Exam Constitutional:      General: She is not in acute distress. Cardiovascular:     Rate and Rhythm: Normal rate and regular rhythm.     Heart sounds: Normal heart sounds.  Pulmonary:     Effort: Pulmonary effort is normal.     Breath sounds: Normal breath sounds.  Abdominal:     General: Bowel sounds are normal.     Palpations: Abdomen is soft.  Skin:    General: Skin is warm and dry.  Neurological:     Mental Status: She is alert and oriented to person, place, and time.        CMP Latest Ref Rng & Units 05/10/2018  Glucose 70 - 99 mg/dL 478(G)  BUN 8 - 23 mg/dL 95(A)  Creatinine 2.13 - 1.00 mg/dL 0.86(V)  Sodium 784 - 696 mmol/L 138  Potassium 3.5 - 5.1 mmol/L 3.5  Chloride 98 - 111 mmol/L 98  CO2 22 - 32 mmol/L 31  Calcium 8.9 - 10.3 mg/dL 29.5  Total Protein 6.5 - 8.1 g/dL -  Total Bilirubin 0.3 - 1.2 mg/dL -  Alkaline Phos 38 - 284 U/L -  AST 15 - 41 U/L -  ALT 0 - 44 U/L -   CBC Latest Ref Rng & Units 08/22/2019  WBC 4.0 - 10.5 K/uL 6.1  Hemoglobin 12.0 - 15.0 g/dL 13.2  Hematocrit 36 - 46 % 40.7  Platelets 150  - 400 K/uL 125(L)     Assessment and plan- Patient is a 74 y.o. female referred for thrombocytopenia   Patient noted to have chronic thrombocytopenia at least dating back to 2019 and her platelet counts have been fluctuating between  80s to 160.  Her most recent CBC again showed isolated thrombocytopenia in the absence of other cytopenias.  Normal and chronic liver disease.  No recent changes in her medications.  Today I will check a CBC with differential, smear review, B12, folate, HIV and hepatitis C testing.  I will see the patient in 2 to 3 weeks time to discuss the results of the blood work.  If the blood work is overall unremarkable we may be dealing with ITP as patient has isolated thrombocytopenia.  I am inclined to monitor this conservatively and intervention would be warranted if platelet counts dropped to less than thirty or if patient develops other cytopenias  Thank you for this kind referral and the opportunity to participate in the care of this patient   Visit Diagnosis 1. Normocytic anemia   2. Thrombocytopenia (HCC)     Dr. Owens Shark, MD, MPH [facility] at [facility] [phone] 08/23/2019 8:26 AM

## 2019-09-03 ENCOUNTER — Other Ambulatory Visit: Payer: Self-pay | Admitting: Internal Medicine

## 2019-09-12 ENCOUNTER — Ambulatory Visit: Payer: Medicare Other | Admitting: Urology

## 2019-09-12 ENCOUNTER — Encounter: Payer: Self-pay | Admitting: Urology

## 2019-09-13 ENCOUNTER — Inpatient Hospital Stay: Payer: Medicare Other | Admitting: Oncology

## 2019-09-13 ENCOUNTER — Telehealth: Payer: Self-pay | Admitting: Oncology

## 2019-09-13 ENCOUNTER — Encounter: Payer: Self-pay | Admitting: Oncology

## 2019-09-13 NOTE — Telephone Encounter (Signed)
Patient missed appt with MD on this date. Writer phoned patient and left voicemail to reschedule. Letter also mailed to patient requesting [facility] to [facility].

## 2019-09-26 ENCOUNTER — Telehealth: Payer: Self-pay | Admitting: Oncology

## 2019-09-26 NOTE — Telephone Encounter (Signed)
Patient phoned on this date and stated that she wanted to change her appt to in-person as she sometimes doesn't get her messages. Appt rescheduled to in-person per patient's request. Patient is aware of new appt time.

## 2019-10-04 ENCOUNTER — Inpatient Hospital Stay: Payer: Medicare Other | Attending: Oncology | Admitting: Oncology

## 2019-10-04 ENCOUNTER — Other Ambulatory Visit: Payer: Self-pay

## 2019-10-04 VITALS — BP 142/59 | HR 60 | Temp 97.7°F | Resp 16 | Wt 212.9 lb

## 2019-10-04 DIAGNOSIS — Z7901 Long term (current) use of anticoagulants: Secondary | ICD-10-CM | POA: Diagnosis not present

## 2019-10-04 DIAGNOSIS — I509 Heart failure, unspecified: Secondary | ICD-10-CM | POA: Insufficient documentation

## 2019-10-04 DIAGNOSIS — I480 Paroxysmal atrial fibrillation: Secondary | ICD-10-CM | POA: Diagnosis not present

## 2019-10-04 DIAGNOSIS — I429 Cardiomyopathy, unspecified: Secondary | ICD-10-CM | POA: Diagnosis not present

## 2019-10-04 DIAGNOSIS — F419 Anxiety disorder, unspecified: Secondary | ICD-10-CM | POA: Diagnosis not present

## 2019-10-04 DIAGNOSIS — Z87891 Personal history of nicotine dependence: Secondary | ICD-10-CM | POA: Diagnosis not present

## 2019-10-04 DIAGNOSIS — K219 Gastro-esophageal reflux disease without esophagitis: Secondary | ICD-10-CM | POA: Insufficient documentation

## 2019-10-04 DIAGNOSIS — E119 Type 2 diabetes mellitus without complications: Secondary | ICD-10-CM | POA: Diagnosis not present

## 2019-10-04 DIAGNOSIS — Z79899 Other long term (current) drug therapy: Secondary | ICD-10-CM | POA: Insufficient documentation

## 2019-10-04 DIAGNOSIS — Z7984 Long term (current) use of oral hypoglycemic drugs: Secondary | ICD-10-CM | POA: Insufficient documentation

## 2019-10-04 DIAGNOSIS — D696 Thrombocytopenia, unspecified: Secondary | ICD-10-CM | POA: Diagnosis not present

## 2019-10-04 DIAGNOSIS — Z87442 Personal history of urinary calculi: Secondary | ICD-10-CM | POA: Diagnosis not present

## 2019-10-04 DIAGNOSIS — E785 Hyperlipidemia, unspecified: Secondary | ICD-10-CM | POA: Insufficient documentation

## 2019-10-04 DIAGNOSIS — Z8249 Family history of ischemic heart disease and other diseases of the circulatory system: Secondary | ICD-10-CM | POA: Diagnosis not present

## 2019-10-04 DIAGNOSIS — I110 Hypertensive heart disease with heart failure: Secondary | ICD-10-CM | POA: Insufficient documentation

## 2019-10-07 NOTE — Progress Notes (Signed)
Hematology/Oncology Consult note [facility]  [phone] [phone]  Patient Care Team: Leonel Ramsay, MD as PCP - General (Infectious Diseases) Minna Merritts, MD as PCP - Cardiology (Cardiology)   Name of the patient: Kristina Archer  [id number]  05-Jun-1945   Date of visit: 10/07/19  Diagnosis-thrombocytopenia  Chief complaint/ Reason for visit-routine follow-up of thrombocytopenia  Heme/Onc history: patient is a 74 year old female with a past medical history significant for congestive heart failure, paroxysmal A. fib, GERD among other medical problems. She has been referred to me for thrombocytopenia.  Looking back at her prior CBCs patient has had a chronically low platelet count which fluctuates between 88-1 sixty.  White count and hemoglobin have been normal.  Most recent CBC from 07/17/2019 showed a white count of 5.5, H&H of 14.5/42.8 and a platelet count of 116.  CMP showed mildly elevated AST and ALT of fifty and fifty-seven respectively within normal albumin and total bilirubin.  Patient does not have any known chronic liver disease.  She denies any changes in her medications recently.  Denies any over-the-counter medications or herbal supplements.  Patient denies any easy bleeding or bruising.  Denies any gum bleeds, nosebleeds, vaginal or GI bleeding.  Interval history-no new complaints since last visit.  Denies any bleeding or bruising  ECOG PS- 1 Pain scale- 0   Review of systems- Review of Systems  Constitutional: Negative for chills, fever, malaise/fatigue and weight loss.  HENT: Negative for congestion, ear discharge and nosebleeds.   Eyes: Negative for blurred vision.  Respiratory: Negative for cough, hemoptysis, sputum production, shortness of breath and wheezing.   Cardiovascular: Negative for chest pain, palpitations, orthopnea and claudication.  Gastrointestinal: Negative for abdominal pain, blood in stool,  constipation, diarrhea, heartburn, melena, nausea and vomiting.  Genitourinary: Negative for dysuria, flank pain, frequency, hematuria and urgency.  Musculoskeletal: Negative for back pain, joint pain and myalgias.  Skin: Negative for rash.  Neurological: Negative for dizziness, tingling, focal weakness, seizures, weakness and headaches.  Endo/Heme/Allergies: Does not bruise/bleed easily.  Psychiatric/Behavioral: Negative for depression and suicidal ideas. The patient does not have insomnia.       Allergies  Allergen Reactions  . Codeine Nausea Only     Past Medical History:  Diagnosis Date  . Anxiety   . Cardiomyopathy ([place])    a. 10/2017 Echo: EF 20-25%, diff HK, mild MR. Nl RV size.  . DDD (degenerative disc disease), lumbar   . Diverticulosis   . Elevated troponin   . Essential hypertension   . GERD (gastroesophageal reflux disease)   . History of kidney stones   . Hyperlipidemia   . LBBB (left bundle branch block)   . PAF (paroxysmal atrial fibrillation) ([place])    a.  Diagnosed 10/2017; b.  On Eliquis and amiodarone; c. CHADS2VASc => 5 (CHF, HTN, age x 1, DM, female)  . Pneumonia   . Sciatica   . Type II diabetes mellitus ([place])      Past Surgical History:  Procedure Laterality Date  . ABDOMINAL HYSTERECTOMY    . CHOLECYSTECTOMY    . CYSTOSCOPY W/ RETROGRADES Bilateral 05/24/2017   Procedure: CYSTOSCOPY WITH RETROGRADE PYELOGRAM;  Surgeon: Hollice Espy, MD;  Location: [facility];  Service: Urology;  Laterality: Bilateral;  . CYSTOSCOPY W/ RETROGRADES Right 01/29/2018   Procedure: CYSTOSCOPY WITH RETROGRADE PYELOGRAM;  Surgeon: Hollice Espy, MD;  Location: [facility];  Service: Urology;  Laterality: Right;  . CYSTOSCOPY W/ RETROGRADES Right 03/02/2018  Procedure: CYSTOSCOPY WITH RETROGRADE PYELOGRAM;  Surgeon: Billey Co, MD;  Location: [facility];  Service: Urology;  Laterality: Right;  . CYSTOSCOPY W/ RETROGRADES Right 03/30/2018   Procedure: CYSTOSCOPY WITH  RETROGRADE PYELOGRAM;  Surgeon: Billey Co, MD;  Location: [facility];  Service: Urology;  Laterality: Right;  . CYSTOSCOPY W/ URETERAL STENT PLACEMENT Right 11/02/2017   Procedure: CYSTOSCOPY WITH RETROGRADE PYELOGRAM/URETERAL STENT PLACEMENT;  Surgeon: Lucas Mallow, MD;  Location: [facility];  Service: Urology;  Laterality: Right;  . CYSTOSCOPY WITH STENT PLACEMENT Right 03/02/2018   Procedure: CYSTOSCOPY WITH STENT PLACEMENT-RIGHT;  Surgeon: Billey Co, MD;  Location: [facility];  Service: Urology;  Laterality: Right;  . CYSTOSCOPY/URETEROSCOPY/HOLMIUM LASER/STENT PLACEMENT Left 05/24/2017   Procedure: CYSTOSCOPY/URETEROSCOPY/HOLMIUM LASER/STENT PLACEMENT;  Surgeon: Hollice Espy, MD;  Location: [facility];  Service: Urology;  Laterality: Left;  . CYSTOSCOPY/URETEROSCOPY/HOLMIUM LASER/STENT PLACEMENT Right 01/29/2018   Procedure: CYSTOSCOPY/URETEROSCOPY/HOLMIUM LASER/STENT Exchange;  Surgeon: Hollice Espy, MD;  Location: [facility];  Service: Urology;  Laterality: Right;  . CYSTOSCOPY/URETEROSCOPY/HOLMIUM LASER/STENT PLACEMENT Right 03/30/2018   Procedure: CYSTOSCOPY/URETEROSCOPY/HOLMIUM LASER/STENT EXCHANGE;  Surgeon: Billey Co, MD;  Location: [facility];  Service: Urology;  Laterality: Right;    Social History   Socioeconomic History  . Marital status: Widowed    Spouse name: Not on file  . Number of children: Not on file  . Years of education: Not on file  . Highest education level: Not on file  Occupational History  . Not on file  Tobacco Use  . Smoking status: Former Smoker    Packs/day: 1.50    Years: 15.00    Pack years: 22.50    Types: Cigarettes    Quit date: 05/12/1997    Years since quitting: 22.4  . Smokeless tobacco: Never Used  Vaping Use  . Vaping Use: Never used  Substance and Sexual Activity  . Alcohol use: Not Currently  . Drug use: Never  . Sexual activity: Not Currently  Other Topics Concern  . Not on file  Social History Narrative  . Not on  file   Social Determinants of Health   Financial Resource Strain:   . Difficulty of Paying Living Expenses: Not on file  Food Insecurity:   . Worried About Charity fundraiser in the Last Year: Not on file  . Ran Out of Food in the Last Year: Not on file  Transportation Needs:   . Lack of Transportation (Medical): Not on file  . Lack of Transportation (Non-Medical): Not on file  Physical Activity:   . Days of Exercise per Week: Not on file  . Minutes of Exercise per Session: Not on file  Stress:   . Feeling of Stress : Not on file  Social Connections:   . Frequency of Communication with Friends and Family: Not on file  . Frequency of Social Gatherings with Friends and Family: Not on file  . Attends Religious Services: Not on file  . Active Member of Clubs or Organizations: Not on file  . Attends Archivist Meetings: Not on file  . Marital Status: Not on file  Intimate Partner Violence:   . Fear of Current or Ex-Partner: Not on file  . Emotionally Abused: Not on file  . Physically Abused: Not on file  . Sexually Abused: Not on file    Family History  Problem Relation Age of Onset  . Heart attack Father 42  . Bladder Cancer Neg Hx   . Kidney cancer Neg Hx  Current Outpatient Medications:  .  amiodarone (PACERONE) 200 MG tablet, Take 1 tablet (200 mg total) by mouth daily., Disp: 60 tablet, Rfl: 0 .  apixaban (ELIQUIS) 5 MG TABS tablet, Take 1 tablet (5 mg total) by mouth 2 (two) times daily. (Patient taking differently: Take 5 mg by mouth 2 (two) times daily. ), Disp: 60 tablet, Rfl: 0 .  carvedilol (COREG) 3.125 MG tablet, TAKE ONE TABLET BY MOUTH TWICE DAILY, Disp: 30 tablet, Rfl: 6 .  furosemide (LASIX) 20 MG tablet, TAKE 1 TABLET BY MOUTH DAILY, Disp: 15 tablet, Rfl: 0 .  JANUVIA 100 MG tablet, TAKE ONE TABLET EVERY DAY, Disp: 30 tablet, Rfl: 6 .  PARoxetine (PAXIL) 20 MG tablet, Take 20 mg by mouth at bedtime., Disp: , Rfl:  .  rosuvastatin (CRESTOR) 40  MG tablet, Take 1 tablet (40 mg total) by mouth daily., Disp: 30 tablet, Rfl: 0 .  sacubitril-valsartan (ENTRESTO) 49-51 MG, Take 1 tablet by mouth 2 (two) times daily., Disp: 60 tablet, Rfl: 0 .  spironolactone (ALDACTONE) 25 MG tablet, TAKE ONE TABLET BY MOUTH EVERY DAY, Disp: 30 tablet, Rfl: 0 .  traZODone (DESYREL) 50 MG tablet, Take 50 mg by mouth at bedtime. , Disp: , Rfl: 3  Physical exam:  Vitals:   10/04/19 1346  BP: (!) 142/59  Pulse: 60  Resp: 16  Temp: 97.7 F (36.5 C)  TempSrc: Tympanic  SpO2: 93%  Weight: 212 lb 14.4 oz (96.6 kg)   Physical Exam Constitutional:      General: She is not in acute distress. Cardiovascular:     Rate and Rhythm: Normal rate and regular rhythm.     Heart sounds: Normal heart sounds.  Pulmonary:     Effort: Pulmonary effort is normal.     Breath sounds: Normal breath sounds.  Abdominal:     General: Bowel sounds are normal.     Palpations: Abdomen is soft.  Skin:    General: Skin is warm and dry.  Neurological:     Mental Status: She is alert and oriented to person, place, and time.      CMP Latest Ref Rng & Units 05/10/2018  Glucose 70 - 99 mg/dL 165(H)  BUN 8 - 23 mg/dL 28(H)  Creatinine 0.44 - 1.00 mg/dL 1.14(H)  Sodium 135 - 145 mmol/L 138  Potassium 3.5 - 5.1 mmol/L 3.5  Chloride 98 - 111 mmol/L 98  CO2 22 - 32 mmol/L 31  Calcium 8.9 - 10.3 mg/dL 10.1  Total Protein 6.5 - 8.1 g/dL -  Total Bilirubin 0.3 - 1.2 mg/dL -  Alkaline Phos 38 - 126 U/L -  AST 15 - 41 U/L -  ALT 0 - 44 U/L -   CBC Latest Ref Rng & Units 08/22/2019  WBC 4.0 - 10.5 K/uL 6.1  Hemoglobin 12.0 - 15.0 g/dL 14.3  Hematocrit 36 - 46 % 40.7  Platelets 150 - 400 K/uL 125(L)      Assessment and plan- Patient is a 74 y.o. female referred for thrombocytopenia here to discuss results of blood work  Results of blood work show mild thrombocytopenia with a platelet count of 125.  B12 and folate were normal.  HIV and hepatitis C testing was negative.  And  smear review was unremarkable.  She does not have any other cytopenias that would warrant a bone marrow biopsy at this time.  I will monitor her platelet counts for now and see her back in 6 months with a CBC with  differential   Visit Diagnosis 1. Thrombocytopenia ([place])      Dr. Randa Evens, MD, MPH [facility] at [facility] [phone] 10/07/2019 1:21 PM

## 2019-10-22 ENCOUNTER — Emergency Department
Admission: EM | Admit: 2019-10-22 | Discharge: 2019-10-22 | Disposition: A | Discharge status: Home / Self Care | Payer: Medicare Other | Attending: Emergency Medicine | Admitting: Emergency Medicine

## 2019-10-22 ENCOUNTER — Other Ambulatory Visit: Payer: Self-pay

## 2019-10-22 ENCOUNTER — Emergency Department: Payer: Medicare Other

## 2019-10-22 DIAGNOSIS — Z7901 Long term (current) use of anticoagulants: Secondary | ICD-10-CM | POA: Insufficient documentation

## 2019-10-22 DIAGNOSIS — R0781 Pleurodynia: Secondary | ICD-10-CM | POA: Insufficient documentation

## 2019-10-22 DIAGNOSIS — Z87891 Personal history of nicotine dependence: Secondary | ICD-10-CM | POA: Diagnosis not present

## 2019-10-22 DIAGNOSIS — I110 Hypertensive heart disease with heart failure: Secondary | ICD-10-CM | POA: Diagnosis not present

## 2019-10-22 DIAGNOSIS — Z79899 Other long term (current) drug therapy: Secondary | ICD-10-CM | POA: Insufficient documentation

## 2019-10-22 DIAGNOSIS — M79651 Pain in right thigh: Secondary | ICD-10-CM | POA: Diagnosis not present

## 2019-10-22 DIAGNOSIS — S20211A Contusion of right front wall of thorax, initial encounter: Secondary | ICD-10-CM

## 2019-10-22 DIAGNOSIS — W010XXA Fall on same level from slipping, tripping and stumbling without subsequent striking against object, initial encounter: Secondary | ICD-10-CM | POA: Diagnosis not present

## 2019-10-22 DIAGNOSIS — S8011XA Contusion of right lower leg, initial encounter: Secondary | ICD-10-CM

## 2019-10-22 DIAGNOSIS — I5023 Acute on chronic systolic (congestive) heart failure: Secondary | ICD-10-CM | POA: Insufficient documentation

## 2019-10-22 DIAGNOSIS — E119 Type 2 diabetes mellitus without complications: Secondary | ICD-10-CM | POA: Insufficient documentation

## 2019-10-22 NOTE — Discharge Instructions (Signed)
Please follow-up with your primary care provider for symptoms that are not improving over the next week or so.  Take Tylenol as needed for pain.  Ice the sore areas off and on throughout the day for about 20 minutes at a time.  Return to the emergency department for symptoms of change or worsen or for new concerns if you are unable to see primary care.

## 2019-10-22 NOTE — ED Triage Notes (Addendum)
Pt states she fell today states tripped over a curb and fell onto right side, pt is having right rib pain and right upper leg pain. Pt has been able to walk, no distress noted.

## 2019-10-22 NOTE — ED Triage Notes (Signed)
First nurse note: Patient to ER via [facility] from home for c/o fall after tripping over curb. Patient reports falling on her right side. +Right hip pain and right ribcage pain. No shortening or rotation present per EMS. Patient has right arm fracture prior to fall from 10.5 months ago. Patient reports being on blood thinners, but did not hit head.

## 2019-10-22 NOTE — ED Provider Notes (Signed)
[facility] Emergency Department Provider Note ____________________________________________   First MD Initiated Contact with Patient 10/22/19 2033     (approximate)  I have reviewed the triage vital signs and the nursing notes.   HISTORY  Chief Complaint Fall  HPI Kristina Archer is a 74 y.o. female with history of multiple chronic medical conditions presents to the emergency department for treatment and evaluation of right rib and right upper thigh pain since falling this evening. She tripped over a concrete hump and fell onto her right side. She denies loss of consciousness, striking her head, or neck pain.          Past Medical History:  Diagnosis Date  . Anxiety   . Cardiomyopathy (HCC)    a. 10/2017 Echo: EF 20-25%, diff HK, mild MR. Nl RV size.  . DDD (degenerative disc disease), lumbar   . Diverticulosis   . Elevated troponin   . Essential hypertension   . GERD (gastroesophageal reflux disease)   . History of kidney stones   . Hyperlipidemia   . LBBB (left bundle branch block)   . PAF (paroxysmal atrial fibrillation) (HCC)    a.  Diagnosed 10/2017; b.  On Eliquis and amiodarone; c. CHADS2VASc => 5 (CHF, HTN, age x 1, DM, female)  . Pneumonia   . Sciatica   . Type II diabetes mellitus [facility])     Patient Active Problem List   Diagnosis Date Noted  . Acute on chronic congestive heart failure (HCC)   . Acute renal insufficiency   . Acute respiratory failure with hypoxia (HCC) 03/05/2018  . HLD (hyperlipidemia) 03/02/2018  . Paroxysmal atrial fibrillation (HCC) 03/02/2018  . GERD (gastroesophageal reflux disease) 03/02/2018  . Anxiety 03/02/2018  . Urinary tract infection with hematuria   . Pneumonia of both lower lobes due to infectious organism   . Sepsis (HCC) 12/18/2017  . Acute on chronic systolic heart failure (HCC)   . Ureterolithiasis 11/02/2017    Past Surgical History:  Procedure Laterality Date  . ABDOMINAL HYSTERECTOMY     . CHOLECYSTECTOMY    . CYSTOSCOPY W/ RETROGRADES Bilateral 05/24/2017   Procedure: CYSTOSCOPY WITH RETROGRADE PYELOGRAM;  Surgeon: Vanna Scotland, MD;  Location: [facility] ORS;  Service: Urology;  Laterality: Bilateral;  . CYSTOSCOPY W/ RETROGRADES Right 01/29/2018   Procedure: CYSTOSCOPY WITH RETROGRADE PYELOGRAM;  Surgeon: Vanna Scotland, MD;  Location: [facility] OR[facility];  Service: Urology;  Laterality: Right;  . CYSTOSCOPY W/ RETROGRADES Right 03/02/2018   Procedure: CYSTOSCOPY WITH RETROGRADE PYELOGRAM;  Surgeon: Sondra Come, MD;  Location: [facility] OR[facility];  Service: Urology;  Laterality: Right;  . CYSTOSCOPY W/ RETROGRADES Right 03/30/2018   Procedure: CYSTOSCOPY WITH RETROGRADE PYELOGRAM;  Surgeon: Sondra Come, MD;  Location: [facility] OR[facility];  Service: Urology;  Laterality: Right;  . CYSTOSCOPY W/ URETERAL STENT PLACEMENT Right 11/02/2017   Procedure: CYSTOSCOPY WITH RETROGRADE PYELOGRAM/URETERAL STENT PLACEMENT;  Surgeon: Crista Elliot, MD;  Location: [facility] OR[facility];  Service: Urology;  Laterality: Right;  . CYSTOSCOPY WITH STENT PLACEMENT Right 03/02/2018   Procedure: CYSTOSCOPY WITH STENT PLACEMENT-RIGHT;  Surgeon: Sondra Come, MD;  Location: [facility];  Service: Urology;  Laterality: Right;  . CYSTOSCOPY/URETEROSCOPY/HOLMIUM LASER/STENT PLACEMENT Left 05/24/2017   Procedure: CYSTOSCOPY/URETEROSCOPY/HOLMIUM LASER/STENT PLACEMENT;  Surgeon: Vanna Scotland, MD;  Location: [facility];  Service: Urology;  Laterality: Left;  . CYSTOSCOPY/URETEROSCOPY/HOLMIUM LASER/STENT PLACEMENT Right 01/29/2018   Procedure: CYSTOSCOPY/URETEROSCOPY/HOLMIUM LASER/STENT Exchange;  Surgeon: Vanna Scotland, MD;  Location: [facility];  Service: Urology;  Laterality: Right;  .  CYSTOSCOPY/URETEROSCOPY/HOLMIUM LASER/STENT PLACEMENT Right 03/30/2018   Procedure: CYSTOSCOPY/URETEROSCOPY/HOLMIUM LASER/STENT EXCHANGE;  Surgeon: Sondra Come, MD;  Location: [facility];  Service: Urology;  Laterality: Right;    Prior to Admission  medications   Medication Sig Start Date End Date Taking? Authorizing Provider  amiodarone (PACERONE) 200 MG tablet Take 1 tablet (200 mg total) by mouth daily. 01/09/18   Sondra Barges, PA-C  apixaban (ELIQUIS) 5 MG TABS tablet Take 1 tablet (5 mg total) by mouth 2 (two) times daily. Patient taking differently: Take 5 mg by mouth 2 (two) times daily.  11/13/17   Katha Hamming, MD  carvedilol (COREG) 3.125 MG tablet TAKE ONE TABLET BY MOUTH TWICE DAILY 08/08/19   Corky Downs, MD  furosemide (LASIX) 20 MG tablet TAKE 1 TABLET BY MOUTH DAILY 06/24/19   Iran Ouch, MD  JANUVIA 100 MG tablet TAKE ONE TABLET EVERY DAY 08/01/19   Corky Downs, MD  PARoxetine (PAXIL) 20 MG tablet Take 20 mg by mouth at bedtime.    [provider]  rosuvastatin (CRESTOR) 40 MG tablet Take 1 tablet (40 mg total) by mouth daily. 11/13/17   Katha Hamming, MD  sacubitril-valsartan (ENTRESTO) 49-51 MG Take 1 tablet by mouth 2 (two) times daily. 05/10/18   Milagros Loll, MD  spironolactone (ALDACTONE) 25 MG tablet TAKE ONE TABLET BY MOUTH EVERY DAY 09/03/19   Corky Downs, MD  traZODone (DESYREL) 50 MG tablet Take 50 mg by mouth at bedtime.  11/30/17   [provider]    Allergies Codeine  Family History  Problem Relation Age of Onset  . Heart attack Father 40  . Bladder Cancer Neg Hx   . Kidney cancer Neg Hx     Social History Social History   Tobacco Use  . Smoking status: Former Smoker    Packs/day: 1.50    Years: 15.00    Pack years: 22.50    Types: Cigarettes    Quit date: 05/12/1997    Years since quitting: 22.4  . Smokeless tobacco: Never Used  Vaping Use  . Vaping Use: Never used  Substance Use Topics  . Alcohol use: Not Currently  . Drug use: Never    Review of Systems  Constitutional: No fever/chills Eyes: No visual changes. ENT: No sore throat. Cardiovascular: Denies chest pain. Respiratory: Denies shortness of breath. Gastrointestinal: No abdominal  pain.  No nausea, no vomiting.  No diarrhea. No constipation. Genitourinary: Negative for dysuria. Musculoskeletal: Negative for neck or back pain. Positive for right rib pain and right lateral upper thigh pain. Skin: Negative for rash. Neurological: Negative for headaches, focal weakness or numbness. ____________________________________________   PHYSICAL EXAM:  VITAL SIGNS: ED Triage Vitals  Enc Vitals Group     BP 10/22/19 1940 (!) 127/59     Pulse Rate 10/22/19 1940 60     Resp 10/22/19 1940 20     Temp 10/22/19 1940 (!) 97.5 F (36.4 C)     Temp Source 10/22/19 1940 Oral     SpO2 10/22/19 1940 100 %     Weight 10/22/19 1942 200 lb (90.7 kg)     Height 10/22/19 1942 5\' 2"  (1.575 m)     Head Circumference --      Peak Flow --      Pain Score 10/22/19 1942 3     Pain Loc --      Pain Edu? --      Excl. in GC? --     Constitutional: Alert and oriented.  Well appearing and in no acute distress. Eyes: Conjunctivae are normal. PERRL. EOMI. Head: Atraumatic. Nose: No congestion/rhinnorhea. Mouth/Throat: Mucous membranes are moist.  Oropharynx non-erythematous. Neck: No stridor. No focal midline cervical tenderness.   Hematological/Lymphatic/Immunilogical: No cervical lymphadenopathy. Cardiovascular: Normal rate, regular rhythm. Grossly normal heart sounds.  Good peripheral circulation. Respiratory: Normal respiratory effort.  No retractions. Lungs CTAB. Gastrointestinal: Soft and nontender. No distention. No abdominal bruits. No CVA tenderness. Genitourinary:  Musculoskeletal: Right lateral thoracic tenderness. Right lateral upper thigh tenderness. No pain with internal or external rotation of the right hip. No shortening or rotation of the right foot. No swelling or tenderness over the right ankle. No lower extremity tenderness nor edema.  No joint effusions. Neurologic:  Normal speech and language. No gross focal neurologic deficits are appreciated. No gait instability. Skin:   Skin is warm, dry and intact. No rash noted. Psychiatric: Mood and affect are normal. Speech and behavior are normal.  ____________________________________________   LABS (all labs ordered are listed, but only abnormal results are displayed)  Labs Reviewed - No data to display ____________________________________________  EKG  Not indicated. ____________________________________________  RADIOLOGY  ED MD interpretation:    Image of the right ribs, chest, and right hip are negative for acute findings.  I, Kem Boroughs, personally viewed and evaluated these images (plain radiographs) as part of my medical decision making, as well as reviewing the written report by the radiologist.  Official radiology report(s): No results found.  ____________________________________________   PROCEDURES  Procedure(s) performed (including Critical Care):  Procedures  ____________________________________________   INITIAL IMPRESSION / ASSESSMENT AND PLAN     74 year old female presenting to the emergency department for treatment and evaluation after a mechanical, nonsyncopal fall.  See HPI for further details.  Plan will be to get an image of the right ribs and chest as well as right hip.  DIFFERENTIAL DIAGNOSIS  Rib fracture, hip or femur fracture, pneumothorax, contusions  ED COURSE  Images of the right ribs, chest, and hip are all negative for acute findings.  Patient is able to ambulate without assistance.  She will be discharged home and advised to rest, ice, and elevate.  She will take Tylenol as needed.  She was encouraged to follow-up with her primary care provider for symptoms that are not improving over the next few days.  She is to return to the emergency department for symptoms of change or worsen if she is unable to schedule an appointment.    ___________________________________________   FINAL CLINICAL IMPRESSION(S) / ED DIAGNOSES  Final diagnoses:  None      ED Discharge Orders    None       Kristina Archer was evaluated in Emergency Department on 10/22/2019 for the symptoms described in the history of present illness. She was evaluated in the context of the global COVID-19 pandemic, which necessitated consideration that the patient might be at risk for infection with the SARS-CoV-2 virus that causes COVID-19. Institutional protocols and algorithms that pertain to the evaluation of patients at risk for COVID-19 are in a state of rapid change based on information released by regulatory bodies including the CDC and federal and state organizations. These policies and algorithms were followed during the patient's care in the ED.   Note:  This document was prepared using Dragon voice recognition software and may include unintentional dictation errors.   Chinita Pester, FNP 10/22/19 2235    Chesley Noon, MD 10/23/19 [phone]

## 2019-10-28 ENCOUNTER — Other Ambulatory Visit: Payer: Self-pay | Admitting: Internal Medicine

## 2019-10-30 ENCOUNTER — Ambulatory Visit
Admission: RE | Admit: 2019-10-30 | Discharge: 2019-10-30 | Disposition: A | Discharge status: Home / Self Care | Payer: Medicare Other | Source: Ambulatory Visit | Attending: Urology | Admitting: Urology

## 2019-10-30 ENCOUNTER — Ambulatory Visit (INDEPENDENT_AMBULATORY_CARE_PROVIDER_SITE_OTHER): Payer: Medicare Other | Admitting: Urology

## 2019-10-30 ENCOUNTER — Other Ambulatory Visit: Payer: Self-pay | Admitting: Urology

## 2019-10-30 ENCOUNTER — Ambulatory Visit
Admission: RE | Admit: 2019-10-30 | Discharge: 2019-10-30 | Disposition: A | Discharge status: Home / Self Care | Payer: Medicare Other | Attending: Urology | Admitting: Urology

## 2019-10-30 ENCOUNTER — Encounter: Payer: Self-pay | Admitting: Urology

## 2019-10-30 ENCOUNTER — Other Ambulatory Visit: Payer: Self-pay

## 2019-10-30 VITALS — BP 115/68 | HR 54 | Ht 62.0 in | Wt 200.0 lb

## 2019-10-30 DIAGNOSIS — N200 Calculus of kidney: Secondary | ICD-10-CM | POA: Insufficient documentation

## 2019-10-30 NOTE — Patient Instructions (Signed)
Dietary Guidelines to Help Prevent Kidney Stones Kidney stones are deposits of minerals and salts that form inside your kidneys. Your risk of developing kidney stones may be greater depending on your diet, your lifestyle, the medicines you take, and whether you have certain medical conditions. Most people can reduce their chances of developing kidney stones by following the instructions below. Depending on your overall health and the type of kidney stones you tend to develop, your dietitian may give you more specific instructions. What are tips for following this plan? Reading food labels  Choose foods with "no salt added" or "low-salt" labels. Limit your sodium intake to less than 1500 mg per day.  Choose foods with calcium for each meal and snack. Try to eat about 300 mg of calcium at each meal. Foods that contain 200-500 mg of calcium per serving include: ? 8 oz (237 ml) of milk, fortified nondairy milk, and fortified fruit juice. ? 8 oz (237 ml) of kefir, yogurt, and soy yogurt. ? 4 oz (118 ml) of tofu. ? 1 oz of cheese. ? 1 cup (300 g) of dried figs. ? 1 cup (91 g) of cooked broccoli. ? 1-3 oz can of sardines or mackerel.  Most people need 1000 to 1500 mg of calcium each day. Talk to your dietitian about how much calcium is recommended for you. Shopping  Buy plenty of fresh fruits and vegetables. Most people do not need to avoid fruits and vegetables, even if they contain nutrients that may contribute to kidney stones.  When shopping for convenience foods, choose: ? Whole pieces of fruit. ? Premade salads with dressing on the side. ? Low-fat fruit and yogurt smoothies.  Avoid buying frozen meals or prepared deli foods.  Look for foods with live cultures, such as yogurt and kefir. Cooking  Do not add salt to food when cooking. Place a salt shaker on the table and allow each person to add his or her own salt to taste.  Use vegetable protein, such as beans, textured vegetable  protein (TVP), or tofu instead of meat in pasta, casseroles, and soups. Meal planning   Eat less salt, if told by your dietitian. To do this: ? Avoid eating processed or premade food. ? Avoid eating fast food.  Eat less animal protein, including cheese, meat, poultry, or fish, if told by your dietitian. To do this: ? Limit the number of times you have meat, poultry, fish, or cheese each week. Eat a diet free of meat at least 2 days a week. ? Eat only one serving each day of meat, poultry, fish, or seafood. ? When you prepare animal protein, cut pieces into small portion sizes. For most meat and fish, one serving is about the size of one deck of cards.  Eat at least 5 servings of fresh fruits and vegetables each day. To do this: ? Keep fruits and vegetables on hand for snacks. ? Eat 1 piece of fruit or a handful of berries with breakfast. ? Have a salad and fruit at lunch. ? Have two kinds of vegetables at dinner.  Limit foods that are high in a substance called oxalate. These include: ? Spinach. ? Rhubarb. ? Beets. ? Potato chips and french fries. ? Nuts.  If you regularly take a diuretic medicine, make sure to eat at least 1-2 fruits or vegetables high in potassium each day. These include: ? Avocado. ? Banana. ? Orange, prune, carrot, or tomato juice. ? Baked potato. ? Cabbage. ? Beans and split   peas. General instructions   Drink enough fluid to keep your urine clear or pale yellow. This is the most important thing you can do.  Talk to your health care provider and dietitian about taking daily supplements. Depending on your health and the cause of your kidney stones, you may be advised: ? Not to take supplements with vitamin C. ? To take a calcium supplement. ? To take a daily probiotic supplement. ? To take other supplements such as magnesium, fish oil, or vitamin B6.  Take all medicines and supplements as told by your health care provider.  Limit alcohol intake to no  more than 1 drink a day for nonpregnant women and 2 drinks a day for men. One drink equals 12 oz of beer, 5 oz of wine, or 1 oz of hard liquor.  Lose weight if told by your health care provider. Work with your dietitian to find strategies and an eating plan that works best for you. What foods are not recommended? Limit your intake of the following foods, or as told by your dietitian. Talk to your dietitian about specific foods you should avoid based on the type of kidney stones and your overall health. Grains Breads. Bagels. Rolls. Baked goods. Salted crackers. Cereal. Pasta. Vegetables Spinach. Rhubarb. Beets. Canned vegetables. Pickles. Olives. Meats and other protein foods Nuts. Nut butters. Large portions of meat, poultry, or fish. Salted or cured meats. Deli meats. Hot dogs. Sausages. Dairy Cheese. Beverages Regular soft drinks. Regular vegetable juice. Seasonings and other foods Seasoning blends with salt. Salad dressings. Canned soups. Soy sauce. Ketchup. Barbecue sauce. Canned pasta sauce. Casseroles. Pizza. Lasagna. Frozen meals. Potato chips. French fries. Summary  You can reduce your risk of kidney stones by making changes to your diet.  The most important thing you can do is drink enough fluid. You should drink enough fluid to keep your urine clear or pale yellow.  Ask your health care provider or dietitian how much protein from animal sources you should eat each day, and also how much salt and calcium you should have each day. This information is not intended to replace advice given to you by your health care provider. Make sure you discuss any questions you have with your health care provider. Document Revised: 04/18/2018 Document Reviewed: 12/08/2015 Elsevier Patient Education  2020 Elsevier Inc.  

## 2019-10-30 NOTE — Progress Notes (Signed)
   10/30/2019 3:37 PM   KRYSTALL KRUCKENBERG [birth year] [id number]  Reason for visit: Follow up nephrolithiasis  HPI: I saw Ms. Lashomb back in urology clinic for follow-up of nephrolithiasis.  She is a 74 year old comorbid female who has an extensive history of nephrolithiasis.  Her last stone event was March 2020 and she required right ureteroscopy, laser lithotripsy, and stent for a 1.3 cm right distal ureteral stone.  She has had no stone events since that time.  She has had multiple hospitalizations for falls as well as congestive heart failure.  She denies any flank pain or gross hematuria.  I personally reviewed her KUB today that shows a stable 4 mm left lower pole stone.  We discussed general stone prevention strategies including adequate hydration with goal of producing 2.5 L of urine daily, increasing citric acid intake, increasing calcium intake during high oxalate meals, minimizing animal protein, and decreasing salt intake. Information about dietary recommendations given today.   She would like to follow-up on an as-needed basis Return precautions discussed at length   Sondra Come, MD  [facility] [address] [place], [place] [address] [phone]

## 2019-11-07 IMAGING — US US RENAL
1 series · 14 of 25 positions shown · non-contrast
Comparison: None.

CLINICAL DATA: Hematuria.

EXAM:
RENAL / URINARY TRACT ULTRASOUND COMPLETE

[Series 1: us renal · 0.25mm/px · 14 of 44 slices shown]
[im 1/44]
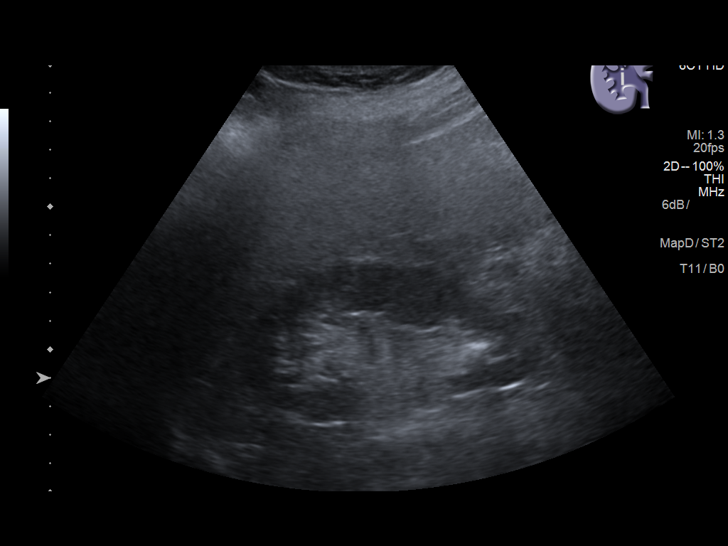
[im 4/44]
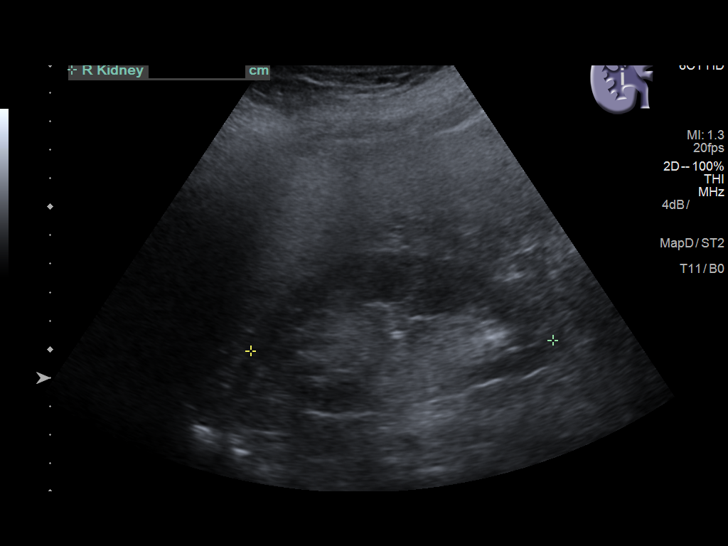
[im 8/44]
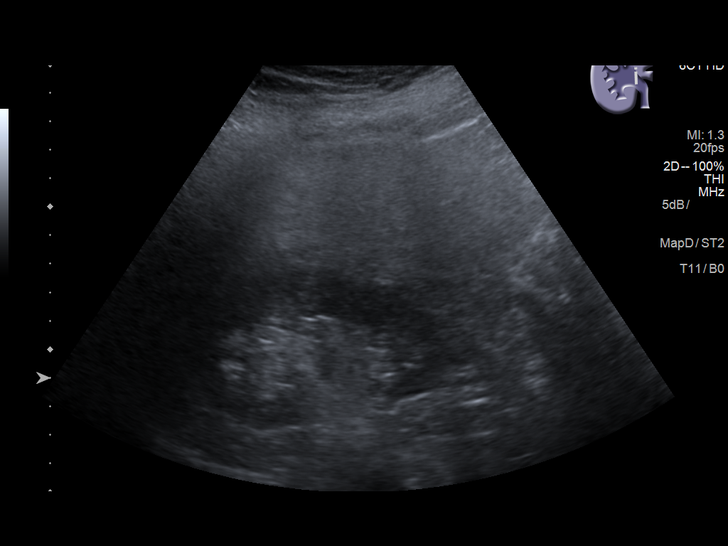
[im 11/44]
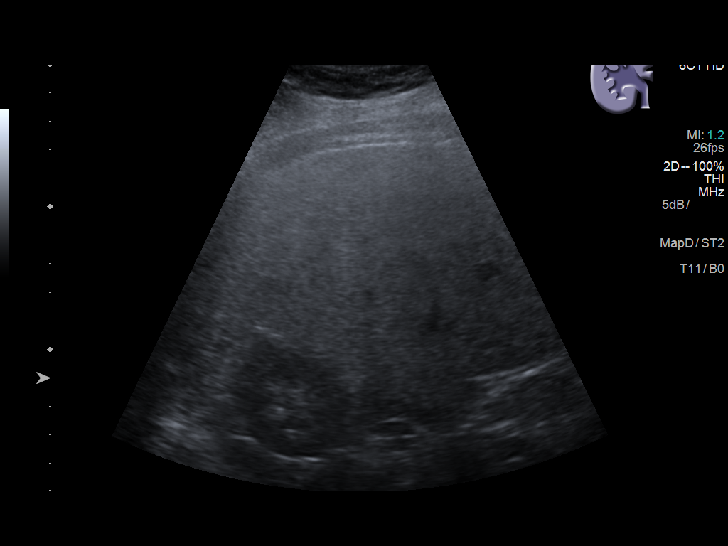
[im 15/44]
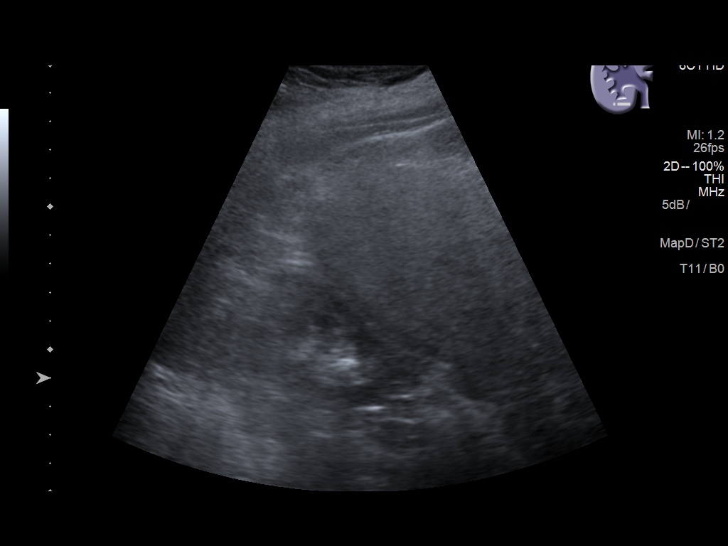
[im 17/44]
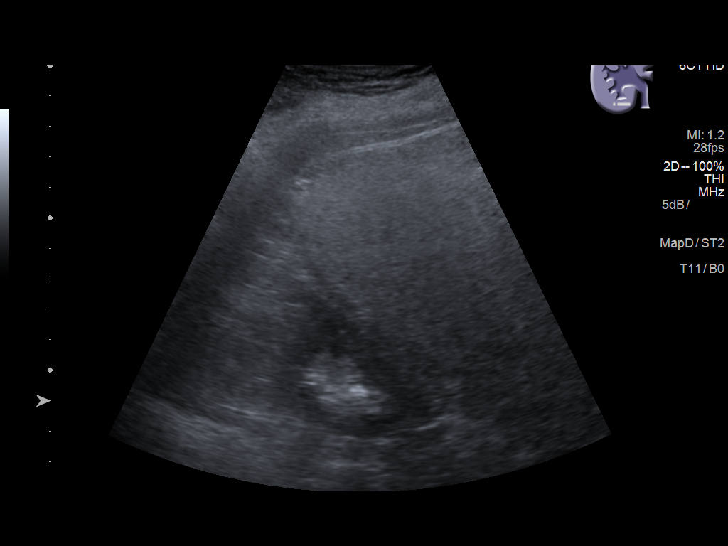
[im 20/44]
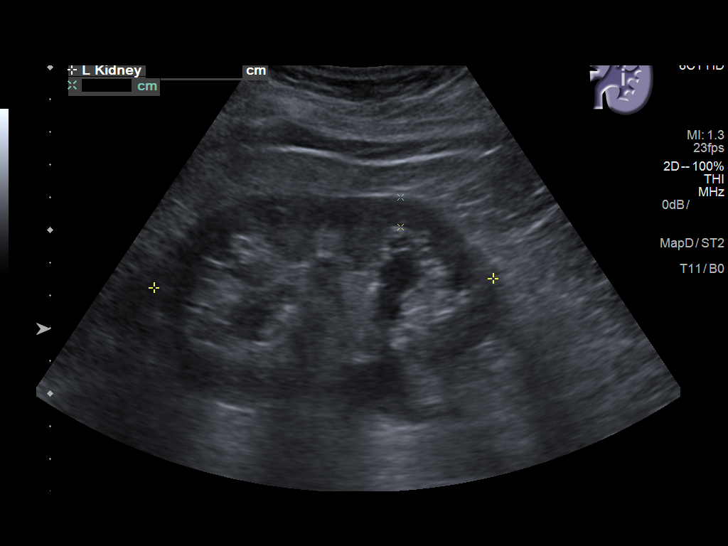
[im 24/44]
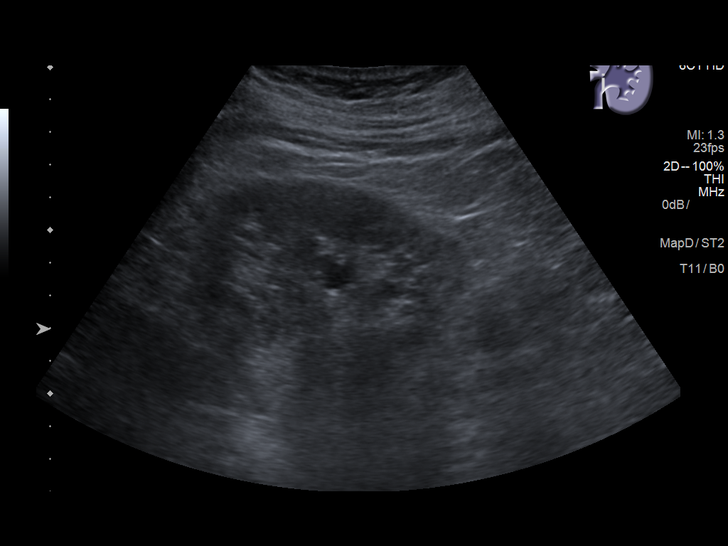
[im 27/44]
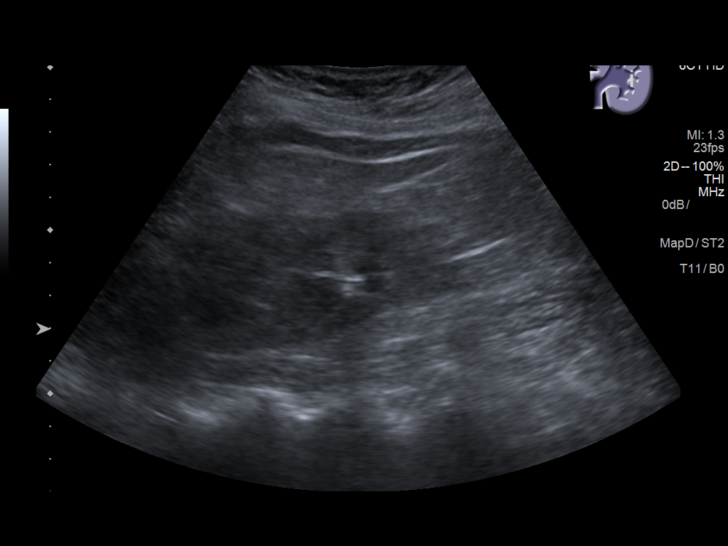
[im 29/44]
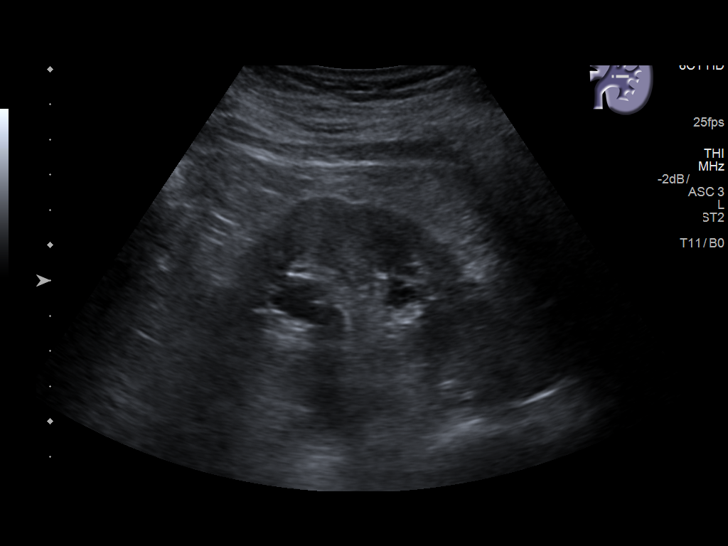
[im 33/44]
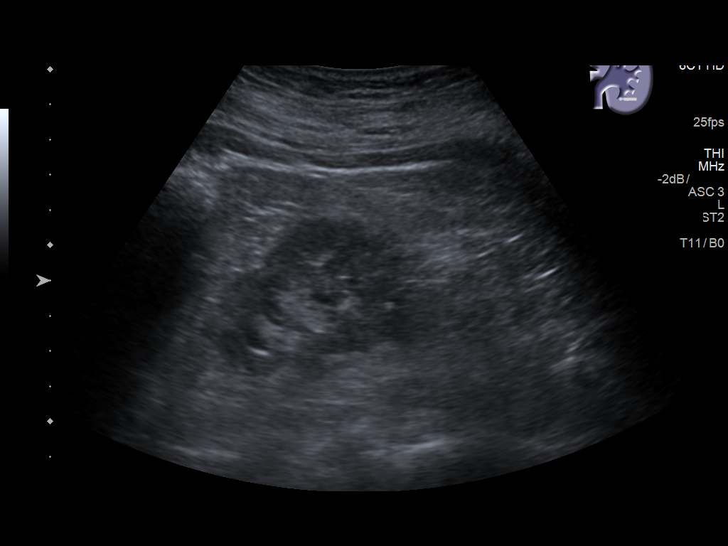
[im 36/44]
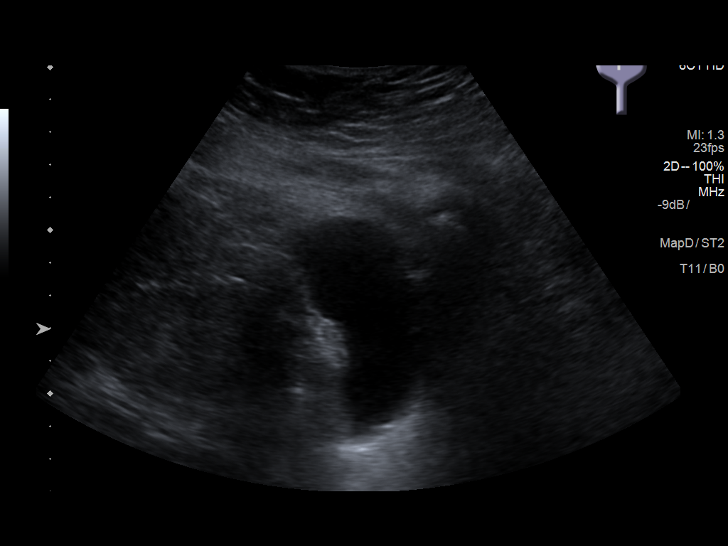
[im 40/44]
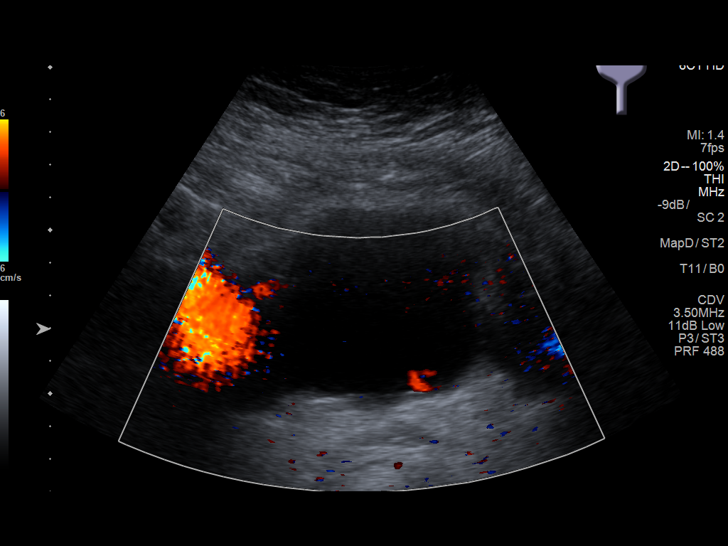
[im 44/44]
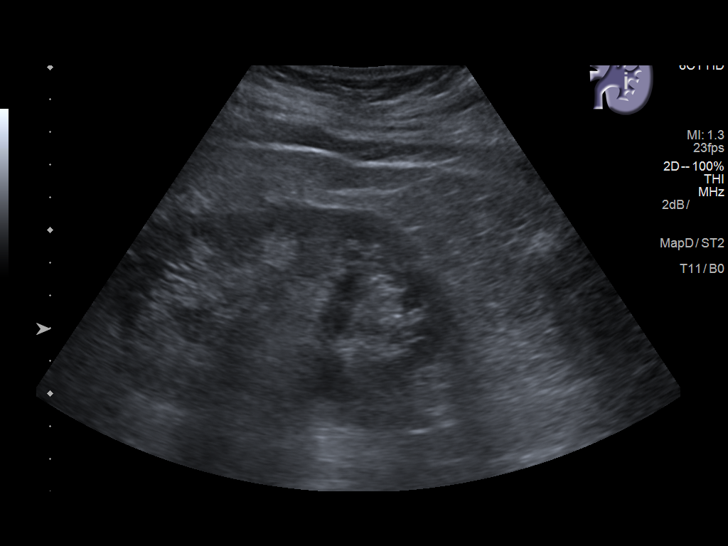

[14 of 25 positions shown; findings below may reference images not displayed]

FINDINGS: Right Kidney:

Length: 10.6 cm. Echogenicity within normal limits. 13 mm stone in
the lower pole right kidney. No mass or hydronephrosis visualized.

Left Kidney:

Length: 10.4 cm. Echogenicity within normal limits. Mild to moderate
left hydronephrosis.

Bladder:

Appears normal for degree of bladder distention. Bilateral ureteral
jets are identified. Bladder appears normal. Prevoid volume was 141
cc. Postvoid volume is 0.
IMPRESSION: 1. Mild to moderate left hydronephrosis without visible etiology.
There is a left ureteral jet demonstrated in the bladder.
2. 13 mm stone in the otherwise normal appearing right kidney.

## 2019-11-09 ENCOUNTER — Other Ambulatory Visit: Payer: Self-pay | Admitting: Internal Medicine

## 2019-11-11 ENCOUNTER — Other Ambulatory Visit: Payer: Self-pay | Admitting: Internal Medicine

## 2019-11-18 ENCOUNTER — Other Ambulatory Visit: Payer: Self-pay | Admitting: Internal Medicine

## 2019-11-20 ENCOUNTER — Other Ambulatory Visit: Payer: Self-pay | Admitting: Infectious Diseases

## 2019-11-20 DIAGNOSIS — R7989 Other specified abnormal findings of blood chemistry: Secondary | ICD-10-CM

## 2019-11-21 ENCOUNTER — Other Ambulatory Visit: Payer: Self-pay | Admitting: Internal Medicine

## 2019-11-27 ENCOUNTER — Other Ambulatory Visit: Payer: Self-pay

## 2019-11-27 ENCOUNTER — Ambulatory Visit
Admission: RE | Admit: 2019-11-27 | Discharge: 2019-11-27 | Disposition: A | Discharge status: Home / Self Care | Payer: Medicare Other | Source: Ambulatory Visit | Attending: Infectious Diseases | Admitting: Infectious Diseases

## 2019-11-27 DIAGNOSIS — R7989 Other specified abnormal findings of blood chemistry: Secondary | ICD-10-CM | POA: Diagnosis not present

## 2019-12-25 ENCOUNTER — Other Ambulatory Visit: Payer: Self-pay | Admitting: Internal Medicine

## 2020-01-23 ENCOUNTER — Other Ambulatory Visit: Payer: Self-pay | Admitting: Internal Medicine

## 2020-02-11 ENCOUNTER — Other Ambulatory Visit: Payer: Self-pay | Admitting: Internal Medicine

## 2020-03-05 ENCOUNTER — Other Ambulatory Visit: Payer: Self-pay | Admitting: Internal Medicine

## 2020-03-27 ENCOUNTER — Telehealth: Payer: Self-pay | Admitting: Oncology

## 2020-03-27 ENCOUNTER — Inpatient Hospital Stay: Payer: Medicare Other | Attending: Oncology

## 2020-03-27 ENCOUNTER — Inpatient Hospital Stay: Payer: Medicare Other | Admitting: Oncology

## 2020-03-27 NOTE — Telephone Encounter (Signed)
Left VM with pt to please return call to reschedule today's missed appt.

## 2020-06-10 ENCOUNTER — Other Ambulatory Visit: Payer: Self-pay | Admitting: Family Medicine

## 2020-06-10 ENCOUNTER — Other Ambulatory Visit: Payer: Self-pay | Admitting: Cardiovascular Disease

## 2020-06-10 ENCOUNTER — Other Ambulatory Visit: Payer: Self-pay

## 2020-06-10 ENCOUNTER — Ambulatory Visit
Admission: RE | Admit: 2020-06-10 | Discharge: 2020-06-10 | Disposition: A | Discharge status: Home / Self Care | Payer: Medicare Other | Source: Ambulatory Visit | Attending: Family Medicine | Admitting: Family Medicine

## 2020-06-10 DIAGNOSIS — N200 Calculus of kidney: Secondary | ICD-10-CM | POA: Diagnosis present

## 2020-06-10 DIAGNOSIS — R109 Unspecified abdominal pain: Secondary | ICD-10-CM | POA: Insufficient documentation

## 2020-06-10 DIAGNOSIS — R10A2 Flank pain, left side: Secondary | ICD-10-CM

## 2020-06-11 ENCOUNTER — Other Ambulatory Visit: Payer: Self-pay | Admitting: Internal Medicine

## 2020-06-15 ENCOUNTER — Other Ambulatory Visit: Payer: Self-pay

## 2020-06-15 ENCOUNTER — Ambulatory Visit (INDEPENDENT_AMBULATORY_CARE_PROVIDER_SITE_OTHER): Payer: Medicare Other | Admitting: Urology

## 2020-06-15 ENCOUNTER — Encounter: Payer: Self-pay | Admitting: Urology

## 2020-06-15 VITALS — BP 139/64 | HR 57 | Ht 62.0 in | Wt 214.0 lb

## 2020-06-15 DIAGNOSIS — N200 Calculus of kidney: Secondary | ICD-10-CM

## 2020-06-15 DIAGNOSIS — N390 Urinary tract infection, site not specified: Secondary | ICD-10-CM | POA: Diagnosis not present

## 2020-06-15 MED ORDER — NITROFURANTOIN MONOHYD MACRO 100 MG PO CAPS: 100.0000 mg | ORAL_CAPSULE | Freq: Two times a day (BID) | ORAL | 0 refills | Status: AC

## 2020-06-15 NOTE — Progress Notes (Signed)
   06/15/2020 3:51 PM   LISAANN ATHA 1945-08-08 [id number]  Reason for visit: Nephrolithiasis, UTI symptoms  HPI: Comorbid 75 year old female who has an extensive history of nephrolithiasis, and most recently went ureteroscopy, laser lithotripsy, and stent placement with me in March 2020.  I most recently saw her in October 2021 when she had a nonobstructing 4 mm left lower pole stone on KUB.  She has been having a few weeks of pelvic pain and pressure with urinary urgency, and was seen by her PCP who ordered a renal ultrasound.  This showed no hydronephrosis, no bladder abnormalities, and a stable left lower pole stone.  She denies any flank pain, gross hematuria, or renal colic.  I personally viewed and interpreted the renal ultrasound 06/10/2020 that shows a stable left lower pole stone with no hydronephrosis.  Urinalysis today concerning for possible UTI with 6-10 WBCs, 11-30 RBCs, few bacteria, nitrite negative, no leukocytes.  Will send for culture.  We discussed possible etiologies including UTI or ureteral stone.  We discussed that lower pole stones do not cause pain, let alone pelvic pressure pelvic pain like she is describing.  I recommended treating her like a UTI and will follow-up culture results, with close follow-up in 1 month and consider repeat KUB if persistent symptoms.  Nitrofurantoin 100 mg twice daily x7 days, follow-up culture results(history of ESBL E. Coli) RTC 1 month PA, consider KUB or CT at that time if persistent symptoms   Sondra Come, MD  [facility] [address] [place], [place] [address] [phone]

## 2020-06-16 LAB — MICROSCOPIC EXAMINATION: Renal Epithel, UA: NEGATIVE /hpf

## 2020-06-16 LAB — URINALYSIS, COMPLETE
Bilirubin, UA: NEGATIVE
Glucose, UA: NEGATIVE
Ketones, UA: NEGATIVE
Leukocytes,UA: NEGATIVE
Nitrite, UA: NEGATIVE
Protein,UA: NEGATIVE
Specific Gravity, UA: 1.02 (ref 1.005–1.030)
Urobilinogen, Ur: 0.2 mg/dL (ref 0.2–1.0)
pH, UA: 5.5 (ref 5.0–7.5)

## 2020-06-19 LAB — CULTURE, URINE COMPREHENSIVE

## 2020-07-14 ENCOUNTER — Ambulatory Visit: Payer: Self-pay | Admitting: Physician Assistant

## 2020-07-14 ENCOUNTER — Encounter: Payer: Self-pay | Admitting: Physician Assistant

## 2020-08-30 IMAGING — CR DG ABDOMEN 1V
1 series · 2 of 2 positions shown · non-contrast
Comparison: CT Abdomen and Pelvis 11/02/2017.

CLINICAL DATA: 71-year-old female with increased pain after
urologic stent placement.

EXAM:
ABDOMEN - 1 VIEW

[Series 1: dg abd 1 view · 0.14mm/px · 2 of 2 slices shown]
[im 1/2]
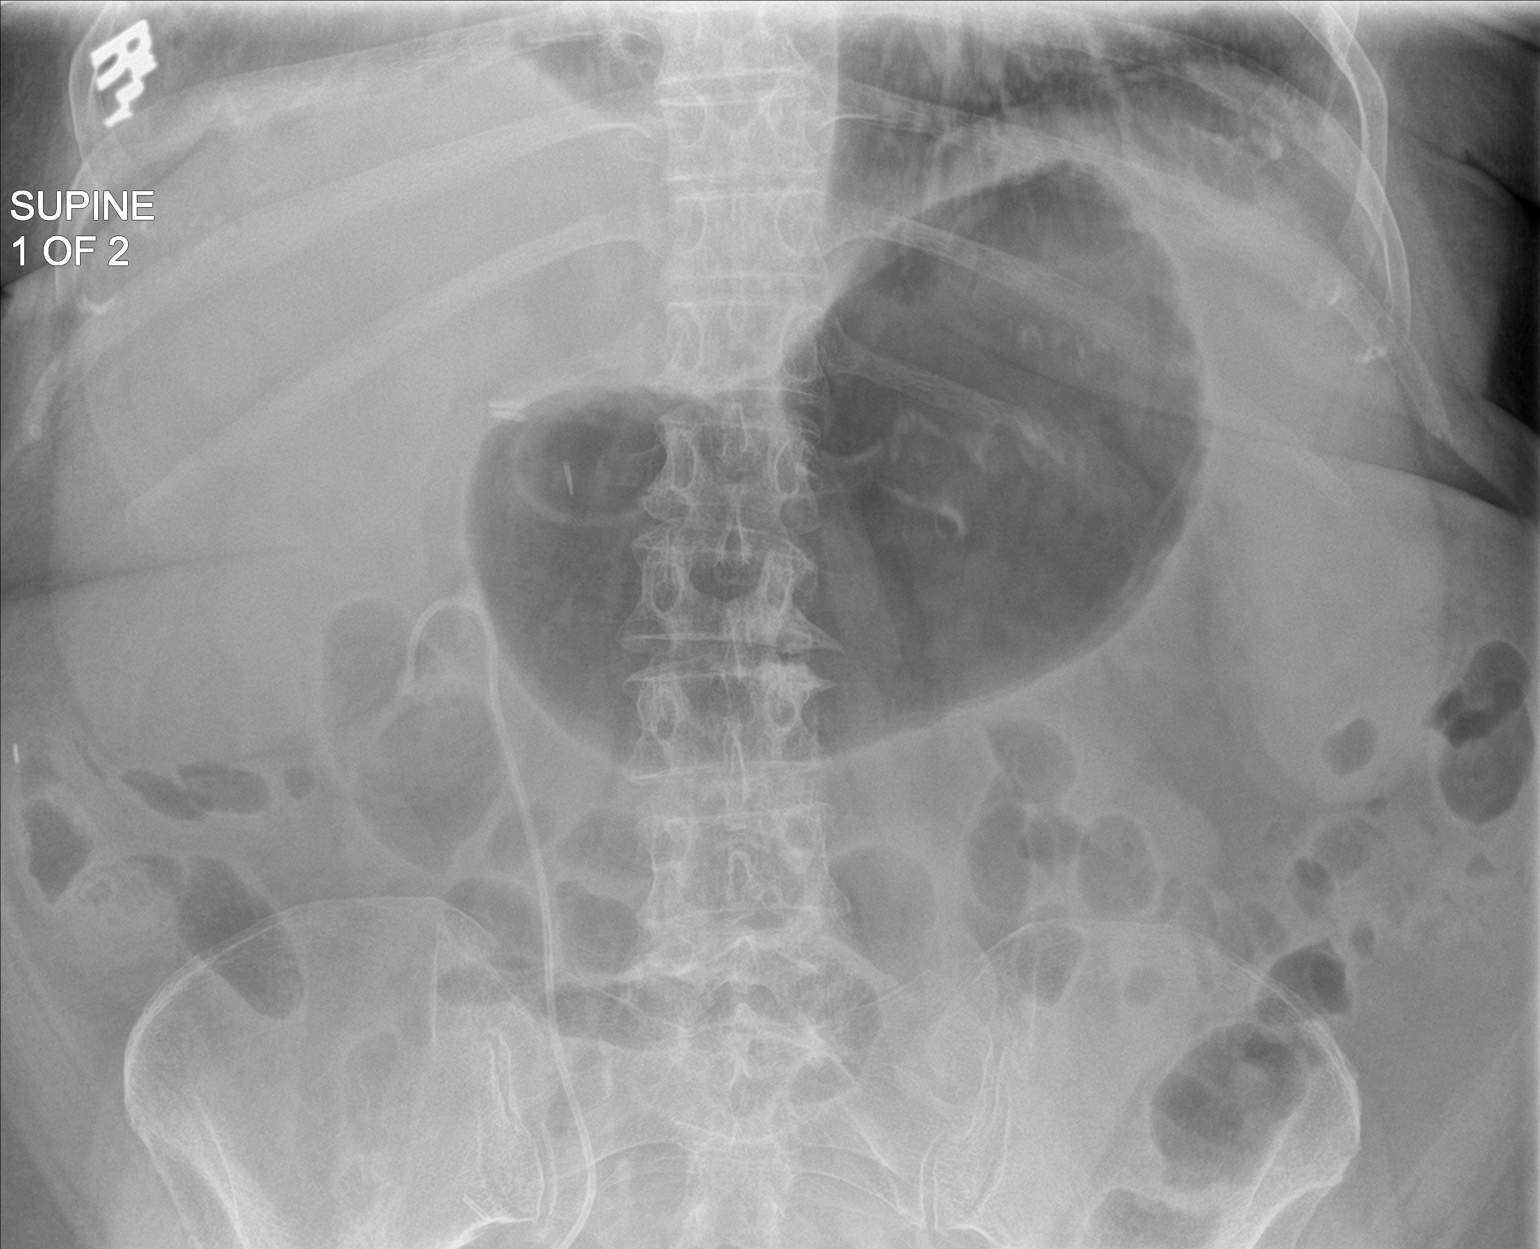
[im 2/2]
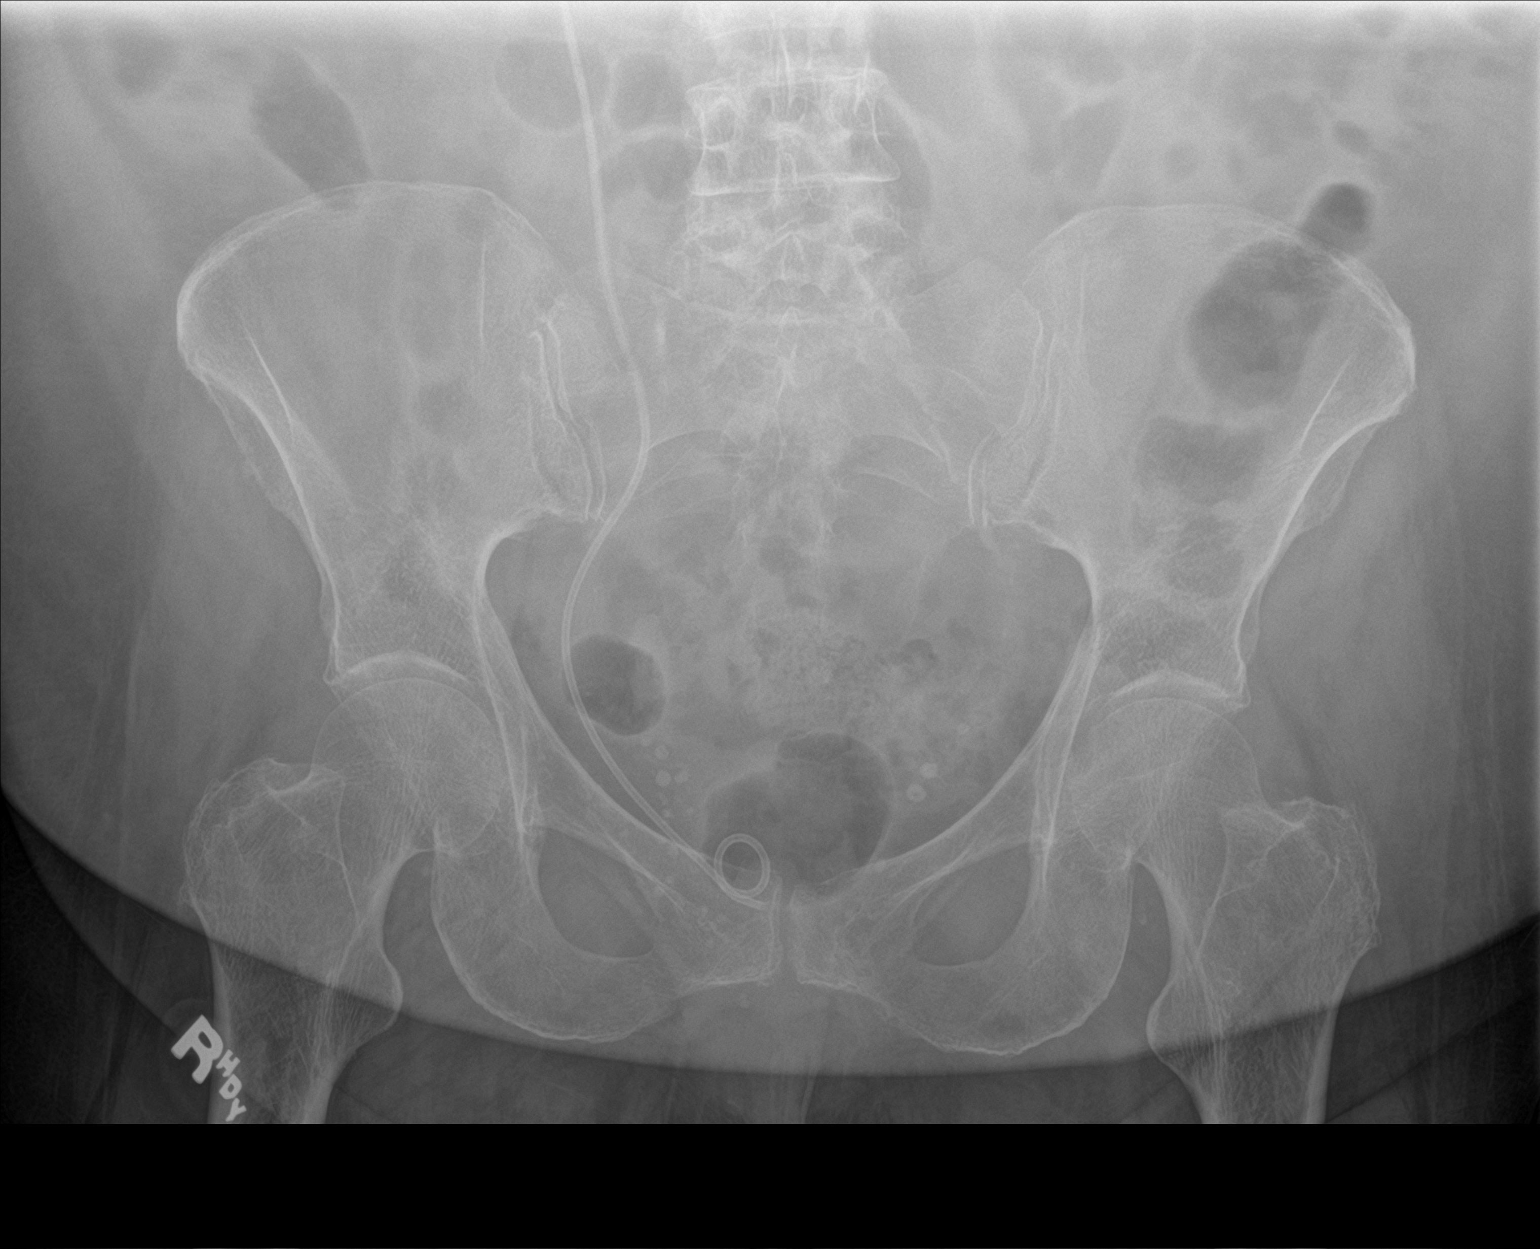

[2 of 2 positions shown; findings below may reference images not displayed]

FINDINGS: Two supine views at 9505 hours. Right double-J ureteral stent has
been placed. The distal pigtail is appropriately located in the
lower central pelvis. The proximal pigtail is partially on looped.
The 10 millimeter right ureteropelvic junction obstructing calculus
seen by CT yesterday is not clearly identified. Numerous pelvic
phleboliths redemonstrated.

New moderate gaseous distension of the stomach. Normal bowel gas
pattern otherwise. Stable cholecystectomy clips. No acute osseous
abnormality identified.
IMPRESSION: 1. Right double-J ureteral stent placed. The proximal pigtail of the
catheter is partially un-coiled. The distal pigtail appears
satisfactory. The 10 mm right UPJ calculus seen by CT yesterday is
not clearly identified.
2. Moderate new gaseous distension of the stomach, but normal bowel
gas pattern otherwise.

## 2020-09-01 IMAGING — DX DG CHEST 1V PORT
1 series · 1 of 1 positions shown · non-contrast
Comparison: 11/03/2017

CLINICAL DATA: CHF

EXAM:
PORTABLE CHEST 1 VIEW

[chest ap]
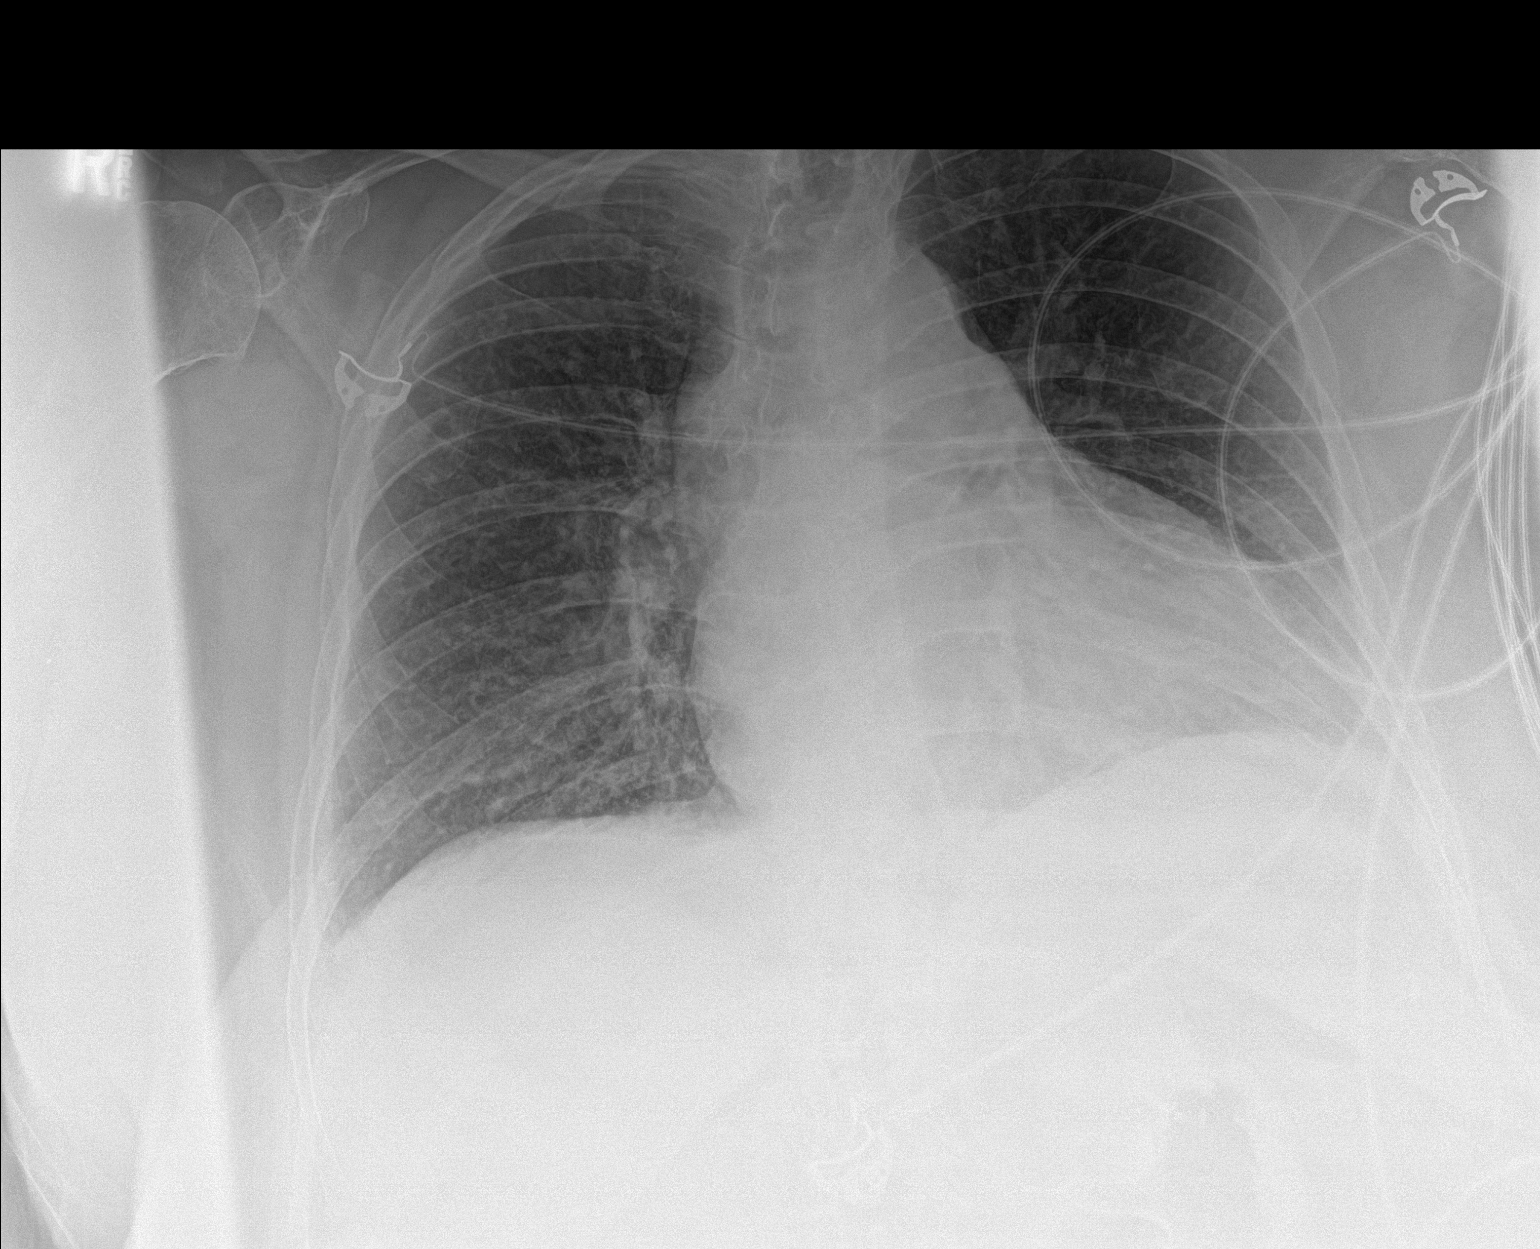

[1 of 1 positions shown; findings below may reference images not displayed]

FINDINGS: Lungs are clear.  No pleural effusion or pneumothorax.

The heart is top-normal in size.
IMPRESSION: No evidence of acute cardiopulmonary disease.

## 2020-10-13 IMAGING — DX DG CHEST 1V PORT
1 series · 2 of 2 positions shown · non-contrast
Comparison: 11/07/2017.

CLINICAL DATA: Hypoxia, vomiting, weakness.

EXAM:
PORTABLE CHEST 1 VIEW

[Series 1: chest ap · 0.14mm/px · 2 of 2 slices shown]
[im 1/2]
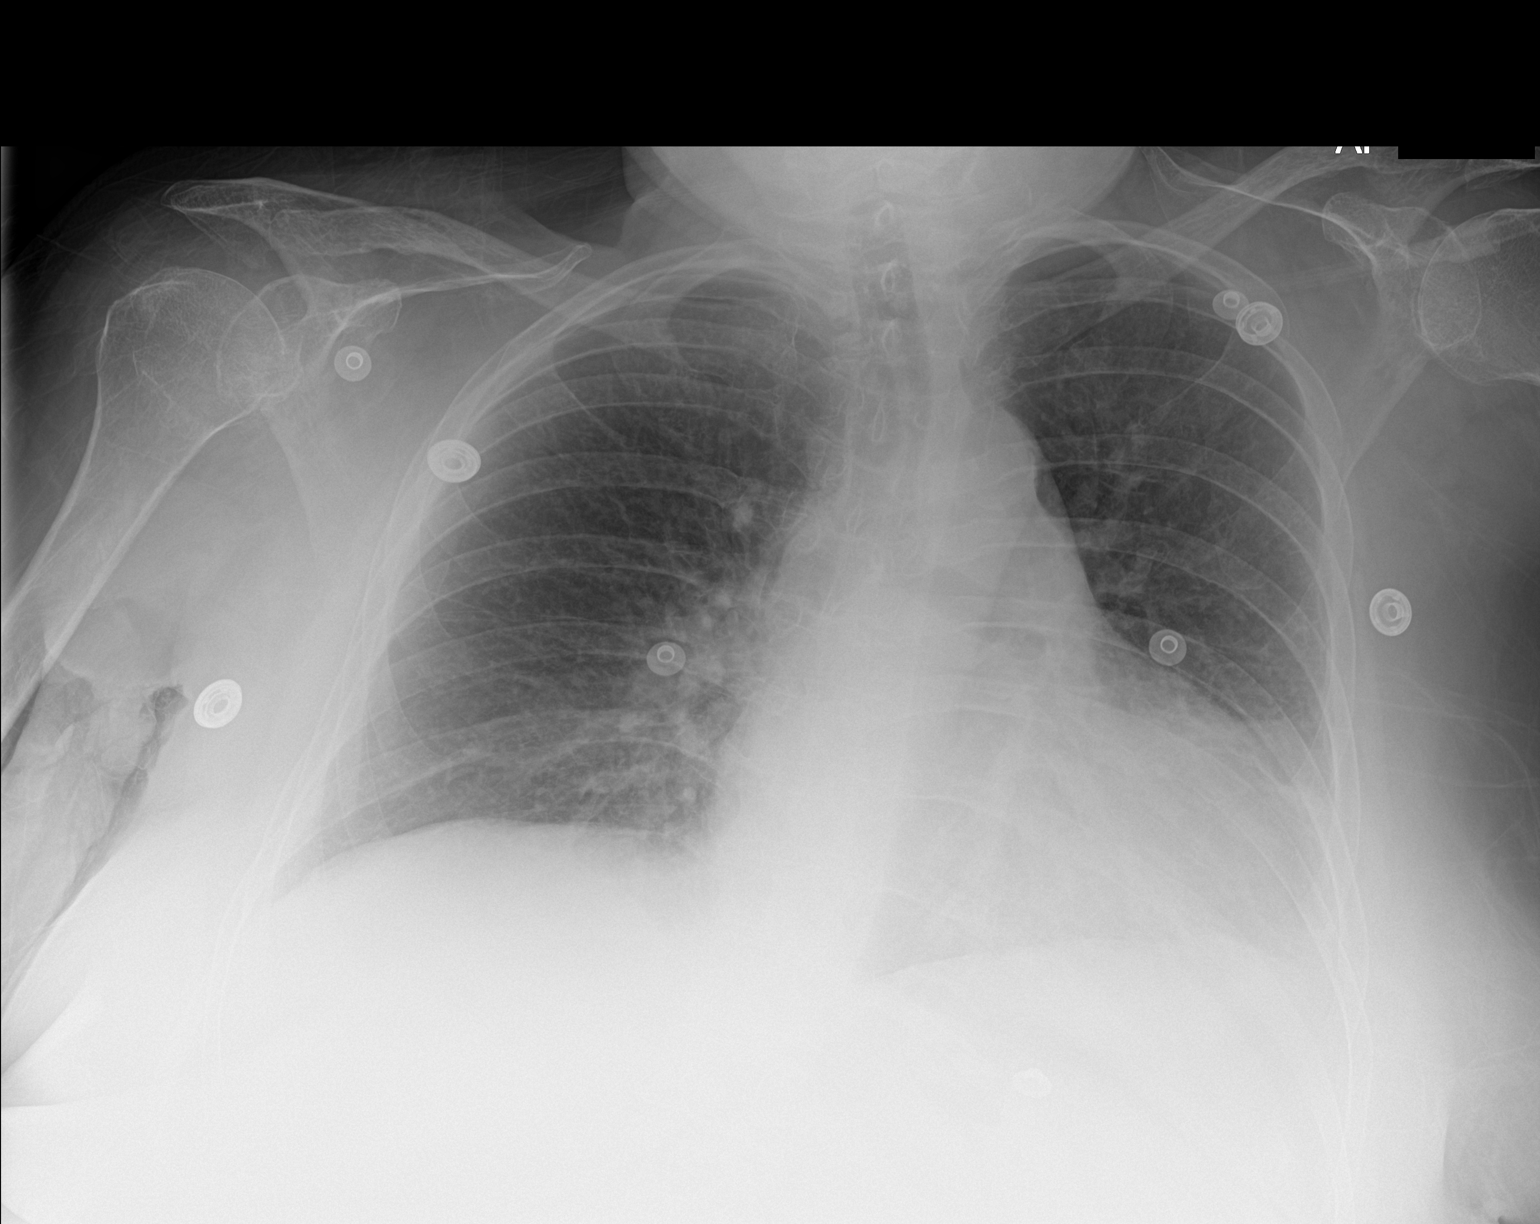
[im 2/2]
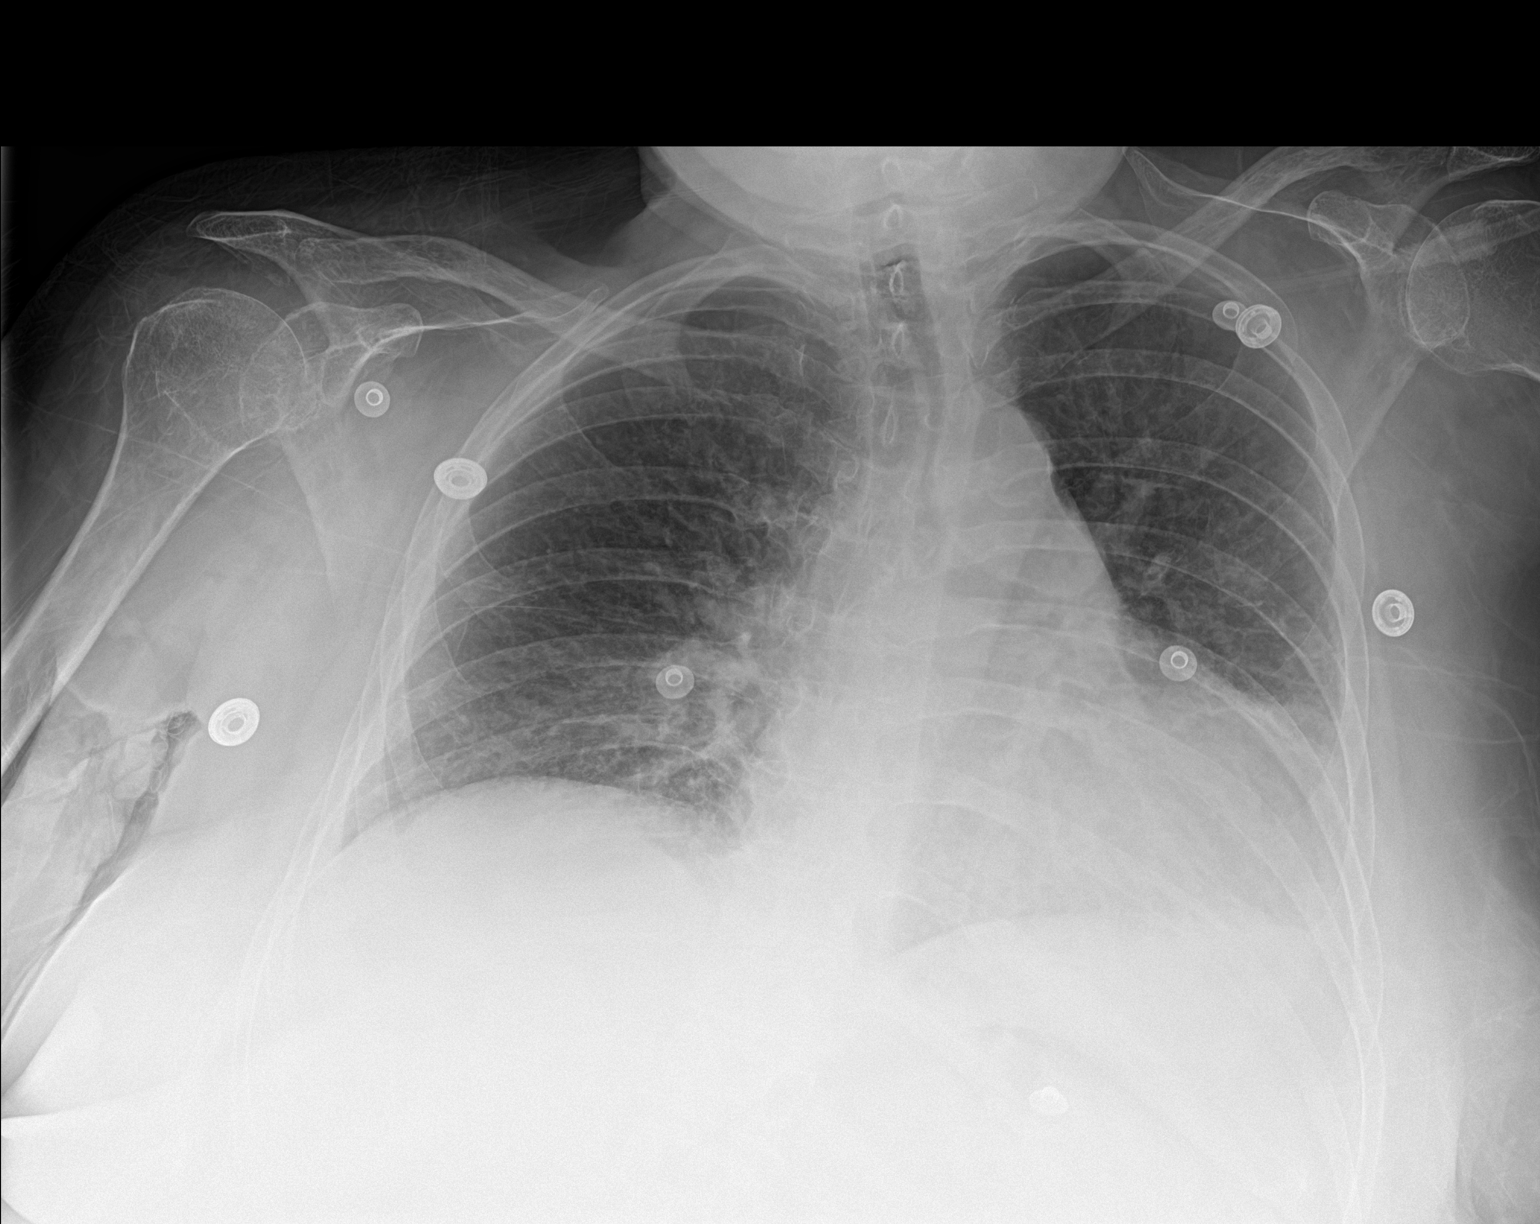

[2 of 2 positions shown; findings below may reference images not displayed]

FINDINGS: Enlarged cardiac silhouette with an interval decrease in size.
Interval mild patchy opacity at the left lung base and possible
small left pleural effusion. Clear right lung. Normal vascularity.
Diffuse osteopenia.
IMPRESSION: 1. Interval mild patchy atelectasis or pneumonia at the left lung
base and possible small left pleural effusion.
2. Improved cardiomegaly.

## 2020-10-15 ENCOUNTER — Other Ambulatory Visit: Payer: Self-pay | Admitting: Family Medicine

## 2020-10-15 ENCOUNTER — Other Ambulatory Visit (HOSPITAL_COMMUNITY): Payer: Self-pay | Admitting: Family Medicine

## 2020-10-15 ENCOUNTER — Ambulatory Visit
Admission: RE | Admit: 2020-10-15 | Discharge: 2020-10-15 | Disposition: A | Discharge status: Home / Self Care | Payer: Medicare Other | Source: Ambulatory Visit | Attending: Family Medicine | Admitting: Family Medicine

## 2020-10-15 ENCOUNTER — Other Ambulatory Visit: Payer: Self-pay

## 2020-10-15 DIAGNOSIS — R1032 Left lower quadrant pain: Secondary | ICD-10-CM

## 2020-10-15 IMAGING — DX DG CHEST 1V PORT
1 series · 1 of 1 positions shown · non-contrast
Comparison: 12/19/2017.

CLINICAL DATA: Dyspnea.

EXAM:
PORTABLE CHEST 1 VIEW

[chest ap]
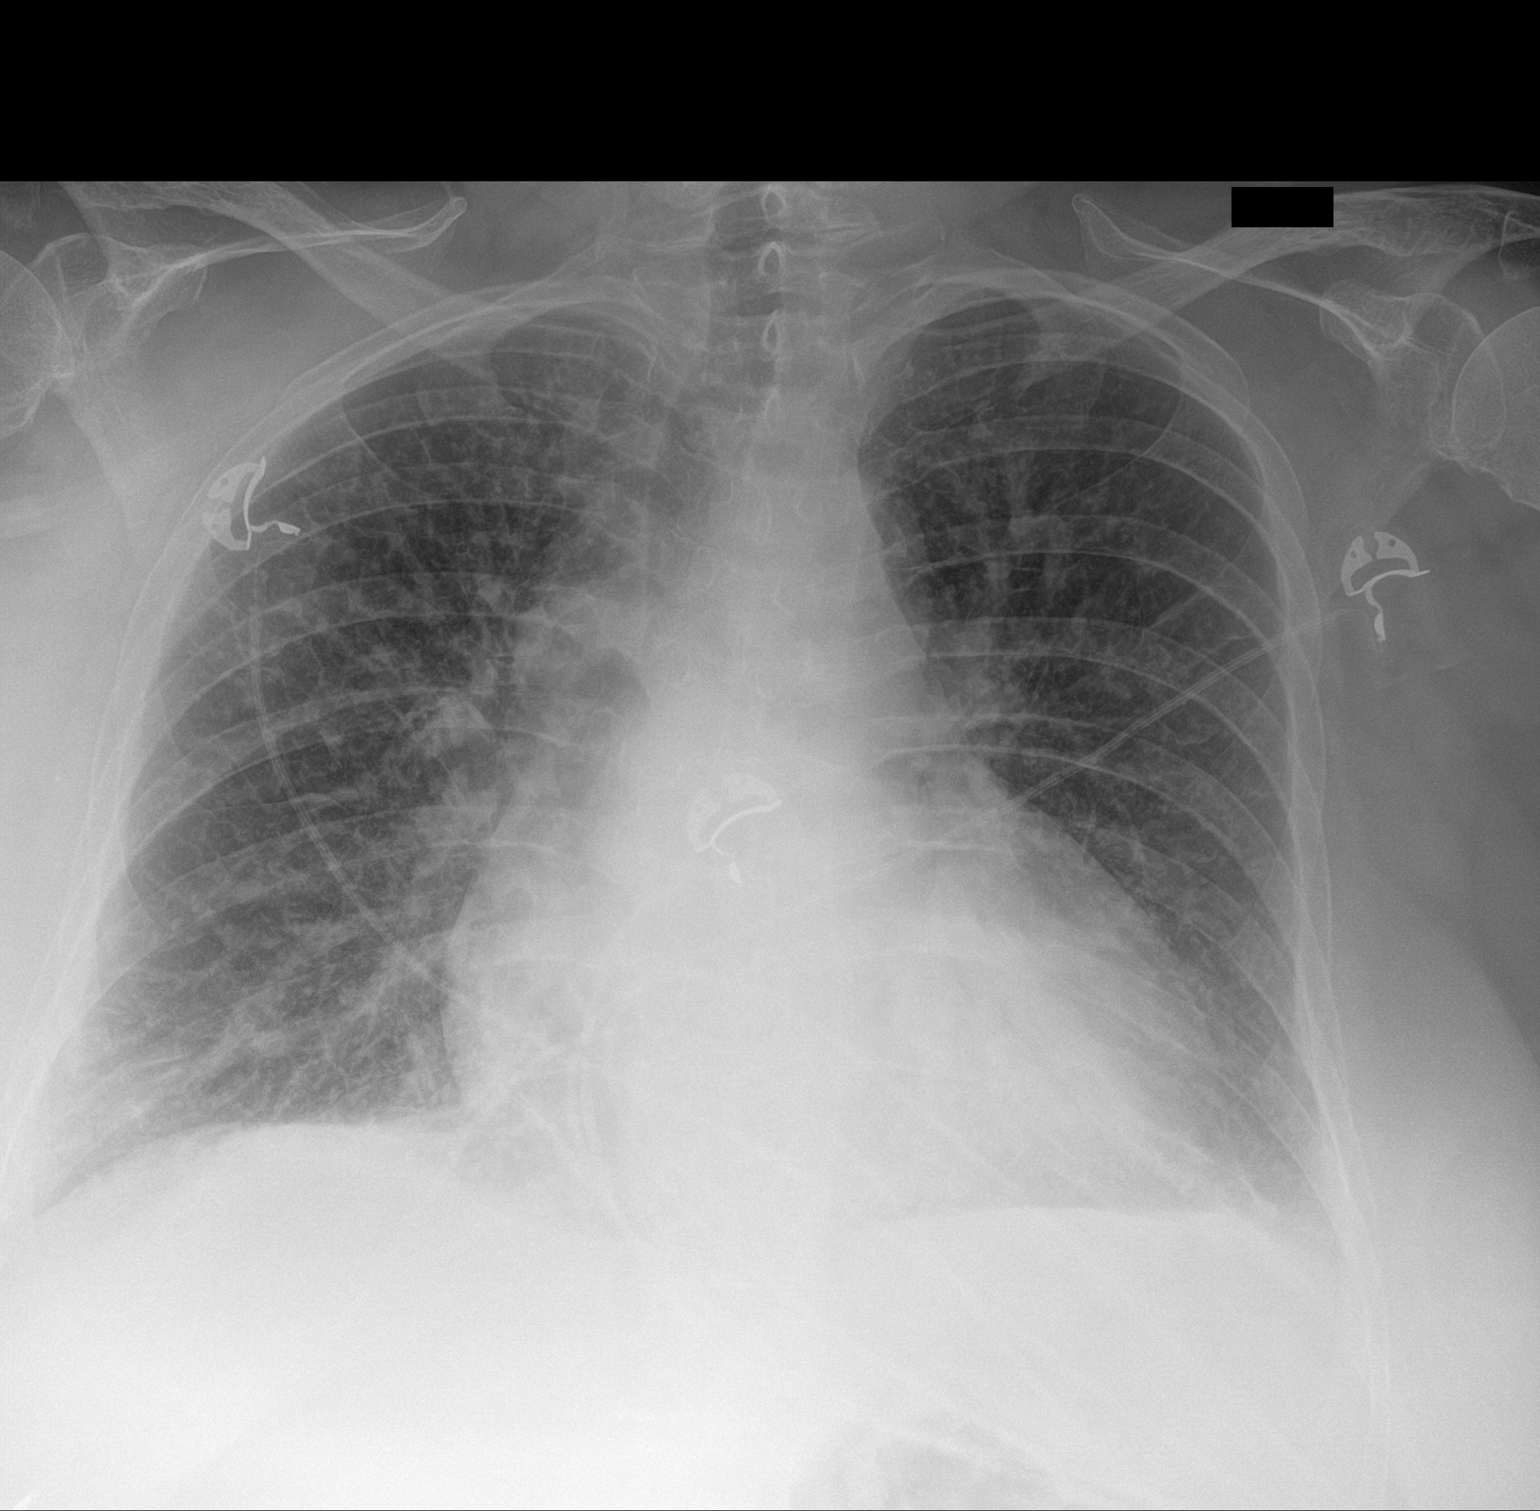

[1 of 1 positions shown; findings below may reference images not displayed]

FINDINGS: Stable enlarged cardiac silhouette. Mild increase in prominence of
the pulmonary vasculature and interstitial markings with some Kerley
lines. Small bilateral pleural effusions. Diffuse osteopenia.
IMPRESSION: Interval changes of acute congestive heart failure with stable
cardiomegaly.

## 2020-12-17 ENCOUNTER — Other Ambulatory Visit: Payer: Self-pay | Admitting: Internal Medicine

## 2021-02-11 ENCOUNTER — Other Ambulatory Visit: Payer: Self-pay | Admitting: Internal Medicine

## 2021-04-08 ENCOUNTER — Other Ambulatory Visit: Payer: Self-pay | Admitting: Infectious Diseases

## 2021-04-08 DIAGNOSIS — Z1231 Encounter for screening mammogram for malignant neoplasm of breast: Secondary | ICD-10-CM

## 2021-04-16 ENCOUNTER — Ambulatory Visit: Admission: RE | Admit: 2021-04-16 | Payer: Medicare Other | Source: Ambulatory Visit

## 2021-09-16 ENCOUNTER — Other Ambulatory Visit: Payer: Self-pay | Admitting: Internal Medicine

## 2021-12-16 ENCOUNTER — Other Ambulatory Visit: Payer: Self-pay | Admitting: Internal Medicine

## 2022-08-30 ENCOUNTER — Other Ambulatory Visit: Payer: Self-pay | Admitting: Infectious Diseases

## 2022-08-30 ENCOUNTER — Ambulatory Visit
Admission: RE | Admit: 2022-08-30 | Discharge: 2022-08-30 | Disposition: A | Discharge status: Home / Self Care | Payer: 59 | Source: Ambulatory Visit | Attending: Infectious Diseases | Admitting: Infectious Diseases

## 2022-08-30 DIAGNOSIS — N200 Calculus of kidney: Secondary | ICD-10-CM

## 2023-02-23 ENCOUNTER — Encounter: Payer: Self-pay | Admitting: Gastroenterology

## 2023-03-06 ENCOUNTER — Ambulatory Visit: Admission: RE | Admit: 2023-03-06 | Payer: 59 | Source: Home / Self Care | Admitting: Gastroenterology

## 2023-03-06 ENCOUNTER — Encounter: Admission: RE | Payer: Self-pay | Source: Home / Self Care

## 2023-03-06 HISTORY — DX: Calculus of ureter: N20.1

## 2023-03-06 HISTORY — DX: Disorder of kidney and ureter, unspecified: N28.9

## 2023-03-06 SURGERY — COLONOSCOPY WITH PROPOFOL
Anesthesia: General
